# Patient Record
Sex: Male | Born: 1945
Health system: Southern US, Community
[De-identification: ages and names within clinical notes are randomized; demographics above are authoritative.]

## PROBLEM LIST (undated history)

## (undated) ENCOUNTER — Emergency Department (HOSPITAL_BASED_OUTPATIENT_CLINIC_OR_DEPARTMENT_OTHER): Admission: EM | Payer: Medicare Other | Source: Home / Self Care

## (undated) DIAGNOSIS — I10 Essential (primary) hypertension: Secondary | ICD-10-CM

## (undated) DIAGNOSIS — B269 Mumps without complication: Secondary | ICD-10-CM

## (undated) DIAGNOSIS — E785 Hyperlipidemia, unspecified: Secondary | ICD-10-CM

## (undated) DIAGNOSIS — H44529 Atrophy of globe, unspecified eye: Secondary | ICD-10-CM

## (undated) DIAGNOSIS — R0789 Other chest pain: Secondary | ICD-10-CM

## (undated) DIAGNOSIS — S0300XA Dislocation of jaw, unspecified side, initial encounter: Secondary | ICD-10-CM

## (undated) DIAGNOSIS — K2270 Barrett's esophagus without dysplasia: Secondary | ICD-10-CM

## (undated) DIAGNOSIS — R609 Edema, unspecified: Secondary | ICD-10-CM

## (undated) DIAGNOSIS — B069 Rubella without complication: Secondary | ICD-10-CM

## (undated) DIAGNOSIS — K219 Gastro-esophageal reflux disease without esophagitis: Secondary | ICD-10-CM

## (undated) DIAGNOSIS — Z8719 Personal history of other diseases of the digestive system: Secondary | ICD-10-CM

## (undated) DIAGNOSIS — I219 Acute myocardial infarction, unspecified: Secondary | ICD-10-CM

## (undated) DIAGNOSIS — G8929 Other chronic pain: Secondary | ICD-10-CM

## (undated) DIAGNOSIS — H40059 Ocular hypertension, unspecified eye: Secondary | ICD-10-CM

## (undated) DIAGNOSIS — M8580 Other specified disorders of bone density and structure, unspecified site: Secondary | ICD-10-CM

## (undated) DIAGNOSIS — H5440 Blindness, one eye, unspecified eye: Secondary | ICD-10-CM

## (undated) DIAGNOSIS — B019 Varicella without complication: Secondary | ICD-10-CM

## (undated) DIAGNOSIS — B059 Measles without complication: Secondary | ICD-10-CM

## (undated) DIAGNOSIS — M549 Dorsalgia, unspecified: Secondary | ICD-10-CM

## (undated) HISTORY — PX: MANDIBLE SURGERY: SHX707

## (undated) HISTORY — DX: Measles without complication: B05.9

## (undated) HISTORY — DX: Ocular hypertension, unspecified eye: H40.059

## (undated) HISTORY — DX: Gastro-esophageal reflux disease without esophagitis: K21.9

## (undated) HISTORY — DX: Atrophy of globe, unspecified eye: H44.529

## (undated) HISTORY — DX: Personal history of other diseases of the digestive system: Z87.19

## (undated) HISTORY — DX: Edema, unspecified: R60.9

## (undated) HISTORY — PX: WISDOM TOOTH EXTRACTION: SHX21

## (undated) HISTORY — DX: Rubella without complication: B06.9

## (undated) HISTORY — DX: Essential (primary) hypertension: I10

## (undated) HISTORY — PX: SPINAL CORD STIMULATOR IMPLANT: SHX2422

## (undated) HISTORY — DX: Other specified disorders of bone density and structure, unspecified site: M85.80

## (undated) HISTORY — DX: Other chest pain: R07.89

## (undated) HISTORY — DX: Acute myocardial infarction, unspecified: I21.9

## (undated) HISTORY — PX: COLONOSCOPY WITH ESOPHAGOGASTRODUODENOSCOPY (EGD): SHX5779

## (undated) HISTORY — DX: Blindness, one eye, unspecified eye: H54.40

## (undated) HISTORY — DX: Mumps without complication: B26.9

## (undated) HISTORY — DX: Varicella without complication: B01.9

## (undated) HISTORY — DX: Hyperlipidemia, unspecified: E78.5

## (undated) HISTORY — DX: Barrett's esophagus without dysplasia: K22.70

## (undated) HISTORY — DX: Dislocation of jaw, unspecified side, initial encounter: S03.00XA

## (undated) HISTORY — DX: Dorsalgia, unspecified: M54.9

## (undated) HISTORY — DX: Other chronic pain: G89.29

## (undated) HISTORY — PX: PROSTATE BIOPSY: SHX241

---

## 1970-06-02 HISTORY — PX: EYE SURGERY: SHX253

## 1970-06-02 HISTORY — PX: ABDOMINAL HERNIA REPAIR: SHX539

## 1971-06-03 HISTORY — PX: APPENDECTOMY: SHX54

## 2003-06-03 HISTORY — PX: CATARACT EXTRACTION, BILATERAL: SHX1313

## 2003-06-03 HISTORY — PX: PARS PLANA VITRECTOMY W/ REPAIR OF MACULAR HOLE: SHX2170

## 2006-06-02 HISTORY — PX: FACIAL RECONSTRUCTION SURGERY: SHX631

## 2008-06-02 HISTORY — PX: PARS PLANA VITRECTOMY W/ REPAIR OF MACULAR HOLE: SHX2170

## 2009-04-13 DIAGNOSIS — M8580 Other specified disorders of bone density and structure, unspecified site: Secondary | ICD-10-CM

## 2009-04-13 HISTORY — DX: Other specified disorders of bone density and structure, unspecified site: M85.80

## 2009-10-31 HISTORY — PX: ESOPHAGOGASTRODUODENOSCOPY: SHX1529

## 2010-11-21 DIAGNOSIS — G8929 Other chronic pain: Secondary | ICD-10-CM | POA: Insufficient documentation

## 2011-01-10 DIAGNOSIS — S0300XA Dislocation of jaw, unspecified side, initial encounter: Secondary | ICD-10-CM | POA: Insufficient documentation

## 2011-01-10 DIAGNOSIS — Z8719 Personal history of other diseases of the digestive system: Secondary | ICD-10-CM

## 2011-01-10 HISTORY — DX: Personal history of other diseases of the digestive system: Z87.19

## 2011-09-10 DIAGNOSIS — H40059 Ocular hypertension, unspecified eye: Secondary | ICD-10-CM

## 2011-09-10 HISTORY — DX: Ocular hypertension, unspecified eye: H40.059

## 2012-03-15 DIAGNOSIS — H44529 Atrophy of globe, unspecified eye: Secondary | ICD-10-CM | POA: Insufficient documentation

## 2012-03-15 DIAGNOSIS — H40009 Preglaucoma, unspecified, unspecified eye: Secondary | ICD-10-CM | POA: Insufficient documentation

## 2012-03-15 HISTORY — DX: Atrophy of globe, unspecified eye: H44.529

## 2012-07-19 DIAGNOSIS — R001 Bradycardia, unspecified: Secondary | ICD-10-CM | POA: Insufficient documentation

## 2012-07-19 DIAGNOSIS — R079 Chest pain, unspecified: Secondary | ICD-10-CM | POA: Insufficient documentation

## 2012-07-19 HISTORY — PX: LEFT HEART CATH: SHX5946

## 2013-02-15 ENCOUNTER — Encounter: Payer: Self-pay | Admitting: Physician Assistant

## 2013-02-15 ENCOUNTER — Ambulatory Visit (INDEPENDENT_AMBULATORY_CARE_PROVIDER_SITE_OTHER): Payer: MEDICARE | Admitting: Physician Assistant

## 2013-02-15 VITALS — BP 124/72 | HR 66 | Temp 97.9°F | Resp 16 | Ht 65.25 in | Wt 186.5 lb

## 2013-02-15 DIAGNOSIS — Z23 Encounter for immunization: Secondary | ICD-10-CM

## 2013-02-15 DIAGNOSIS — I10 Essential (primary) hypertension: Secondary | ICD-10-CM

## 2013-02-15 DIAGNOSIS — M533 Sacrococcygeal disorders, not elsewhere classified: Secondary | ICD-10-CM

## 2013-02-15 DIAGNOSIS — R1013 Epigastric pain: Secondary | ICD-10-CM | POA: Insufficient documentation

## 2013-02-15 DIAGNOSIS — M545 Low back pain: Secondary | ICD-10-CM | POA: Insufficient documentation

## 2013-02-15 DIAGNOSIS — K219 Gastro-esophageal reflux disease without esophagitis: Secondary | ICD-10-CM

## 2013-02-15 DIAGNOSIS — Z299 Encounter for prophylactic measures, unspecified: Secondary | ICD-10-CM

## 2013-02-15 DIAGNOSIS — H9193 Unspecified hearing loss, bilateral: Secondary | ICD-10-CM

## 2013-02-15 DIAGNOSIS — H9190 Unspecified hearing loss, unspecified ear: Secondary | ICD-10-CM

## 2013-02-15 LAB — BASIC METABOLIC PANEL
BUN: 9 mg/dL (ref 6–23)
Calcium: 10 mg/dL (ref 8.4–10.5)
Potassium: 4 mEq/L (ref 3.5–5.3)
Sodium: 137 mEq/L (ref 135–145)

## 2013-02-15 LAB — CBC WITH DIFFERENTIAL/PLATELET
Basophils Absolute: 0 10*3/uL (ref 0.0–0.1)
Basophils Relative: 1 % (ref 0–1)
HCT: 42.4 % (ref 39.0–52.0)
Lymphocytes Relative: 39 % (ref 12–46)
Monocytes Absolute: 0.5 10*3/uL (ref 0.1–1.0)
Neutro Abs: 1.9 10*3/uL (ref 1.7–7.7)
Neutrophils Relative %: 43 % (ref 43–77)
RDW: 13.4 % (ref 11.5–15.5)
WBC: 4.5 10*3/uL (ref 4.0–10.5)

## 2013-02-15 MED ORDER — HYDROCHLOROTHIAZIDE 12.5 MG PO CAPS: 12.5000 mg | ORAL_CAPSULE | Freq: Every day | ORAL | Status: DC

## 2013-02-15 NOTE — Assessment & Plan Note (Signed)
Will set up MRI.  Will call patient with results.  May need orthopedics referral. Continue conservative measures for now.

## 2013-02-15 NOTE — Assessment & Plan Note (Signed)
Epigastric tenderness to palpation.  Patient with hx of GERD.  Will obtain lipase, H. Pylori.  Continue PPI

## 2013-02-15 NOTE — Assessment & Plan Note (Addendum)
Obtain fasting labs.  Patient up-to-date on health maintenance.  Influenza vaccination given.  Zostavax vaccination given.  Will give referral to podiatry per patient's request

## 2013-02-15 NOTE — Assessment & Plan Note (Signed)
Referral to ENT for audiologic evaluation.

## 2013-02-15 NOTE — Patient Instructions (Signed)
Please get labs today.  I will call you with results.  You will get a call from ENT, Podiatry, and imaging for your MRI.  Please wear compression stocking on the left leg and elevate at home to help reduce swelling.  I will tell you when you need to follow-up after I get your lab results.  Back Pain, Adult Low back pain is very common. About 1 in 5 people have back pain.The cause of low back pain is rarely dangerous. The pain often gets better over time.About half of people with a sudden onset of back pain feel better in just 2 weeks. About 8 in 10 people feel better by 6 weeks.  CAUSES Some common causes of back pain include:  Strain of the muscles or ligaments supporting the spine.  Wear and tear (degeneration) of the spinal discs.  Arthritis.  Direct injury to the back. DIAGNOSIS Most of the time, the direct cause of low back pain is not known.However, back pain can be treated effectively even when the exact cause of the pain is unknown.Answering your caregiver's questions about your overall health and symptoms is one of the most accurate ways to make sure the cause of your pain is not dangerous. If your caregiver needs more information, he or she may order lab work or imaging tests (X-rays or MRIs).However, even if imaging tests show changes in your back, this usually does not require surgery. HOME CARE INSTRUCTIONS For many people, back pain returns.Since low back pain is rarely dangerous, it is often a condition that people can learn to [facility] their own.   Remain active. It is stressful on the back to sit or stand in one place. Do not sit, drive, or stand in one place for more than 30 minutes at a time. Take short walks on level surfaces as soon as pain allows.Try to increase the length of time you walk each day.  Do not stay in bed.Resting more than 1 or 2 days can delay your recovery.  Do not avoid exercise or work.Your body is made to move.It is not dangerous to be  active, even though your back may hurt.Your back will likely heal faster if you return to being active before your pain is gone.  Pay attention to your body when you bend and lift. Many people have less discomfortwhen lifting if they bend their knees, keep the load close to their bodies,and avoid twisting. Often, the most comfortable positions are those that put less stress on your recovering back.  Find a comfortable position to sleep. Use a firm mattress and lie on your side with your knees slightly bent. If you lie on your back, put a pillow under your knees.  Only take over-the-counter or prescription medicines as directed by your caregiver. Over-the-counter medicines to reduce pain and inflammation are often the most helpful.Your caregiver may prescribe muscle relaxant drugs.These medicines help dull your pain so you can more quickly return to your normal activities and healthy exercise.  Put ice on the injured area.  Put ice in a plastic bag.  Place a towel between your skin and the bag.  Leave the ice on for 15-20 minutes, 3-4 times a day for the first 2 to 3 days. After that, ice and heat may be alternated to reduce pain and spasms.  Ask your caregiver about trying back exercises and gentle massage. This may be of some benefit.  Avoid feeling anxious or stressed.Stress increases muscle tension and can worsen back pain.It is  important to recognize when you are anxious or stressed and learn ways to manage it.Exercise is a great option. SEEK MEDICAL CARE IF:  You have pain that is not relieved with rest or medicine.  You have pain that does not improve in 1 week.  You have new symptoms.  You are generally not feeling well. SEEK IMMEDIATE MEDICAL CARE IF:   You have pain that radiates from your back into your legs.  You develop new bowel or bladder control problems.  You have unusual weakness or numbness in your arms or legs.  You develop nausea or vomiting.  You  develop abdominal pain.  You feel faint. Document Released: 05/19/2005 Document Revised: 11/18/2011 Document Reviewed: 10/07/2010 [facility] Patient Information 2014 [place], [place].

## 2013-02-15 NOTE — Assessment & Plan Note (Signed)
Continue current regimen.  Avoid trigger foods.  TUMS for breakthrough symptoms.  If symptoms worsen, will need referral to ENT or GI.

## 2013-02-15 NOTE — Progress Notes (Signed)
Patient ID: Brian [name], male   DOB: [birth year], 67 y.o.   MRN: [id number]  Patient presents to clinic today to establish care.  Medical record release given to obtain records from old PCP at [facility].  Acute Concerns: Patient complains of lumbar pain with shooting pains traveling down his right leg.  Patient states this has been present over the past 6 months and is worsening in severity.  Patient states that PCP obtained X ray of his back which showed arthritis.  Has taken Tylenol and Ibuprofen with no relief of symptoms.  Patient denies numbness or tingling of RLE, saddle anesthesia, urinary incontinence/retention, or fecal incontinence, retention.  Denies weakness of RLE.  Patient also endorses decrease in hearing.  Patient denies ear drainage, pain or tinnitus.  Denies having a formal audiology examination in the past.  Denies trauma to ears.  Denies q-tip usage.  Is unsure if hearing change is coming from one ear or both ears.  Patient also requests referral to Podiatry for chronic calluses on both feet.  Chronic Issues: (1) GERD -- Prilosec 40 mg daily.  Has breakthrough symptoms for which he takes [place].  Has had Upper GI in the past and biopsy which ruled/out Barrett's Esophagus. Was told he should probably have a Nissen procedure but declined. Patient tries to be mindful of which foods he eats and tries to avoid late-night meals  (2) Hyperlipidemia -- Patient reports history of high cholesterol for which he takes a daily Zocor 40 mg tablet.  (3) Peripheral edema -- Patient reports history of chronic edema of R ankle for which his previous PCP prescribed Microzide.  He states he is unaware if he has history of high blood pressure.    Health Maintenance: Eye -- Last Ophthalmology exam 6 months ago.  Has Ophthalmologist appointment in 1 month in [facility]. Dental -- edentulous.  No dental visit in 1+ year. Colonoscopy -- 2 years ago. No abnormalities Immunizations -- tetanus  2013.  Pneumonia vaccine (2013).  Shingles vaccine overdue.  Needs influenza vaccine  Past Medical History  Diagnosis Date  . Chicken pox   . GERD (gastroesophageal reflux disease)   . Measles   . [place] measles   . Mumps   . Hyperlipidemia   . Edema     Left Leg    No current outpatient prescriptions on file prior to visit.   No current facility-administered medications on file prior to visit.    No Known Allergies  Family History  Problem Relation Age of Onset  . Alzheimer's disease Mother     Deceased  . Diabetes Maternal Grandmother   . Heart attack Maternal Grandmother   . Prostate cancer Maternal Uncle   . Diabetes Maternal Uncle   . Healthy Son     History   Social History  . Marital Status: Single    Spouse Name: N/A    Number of Children: N/A  . Years of Education: N/A   Social History Main Topics  . Smoking status: Former Smoker    Quit date: 06/02/1966  . Smokeless tobacco: Never Used  . Alcohol Use: No  . Drug Use: No  . Sexual Activity: None   Other Topics Concern  . None   Social History Narrative  . None    Review of Systems  Constitutional: Negative for fever, chills, weight loss and malaise/fatigue.  HENT: Positive for hearing loss. Negative for ear pain, tinnitus and ear discharge.   Eyes: Negative for blurred vision, double  vision and photophobia.  Respiratory: Negative for cough, shortness of breath and wheezing.   Cardiovascular: Negative for chest pain and palpitations.  Gastrointestinal: Positive for heartburn. Negative for nausea, vomiting, abdominal pain, diarrhea, constipation, blood in stool and melena.  Genitourinary: Negative for dysuria, urgency, frequency, hematuria and flank pain.  Musculoskeletal: Positive for back pain.  Neurological: Negative for seizures, loss of consciousness and headaches.  Endo/Heme/Allergies: Negative for environmental allergies.  Psychiatric/Behavioral: Negative for depression, suicidal ideas  and substance abuse. The patient is not nervous/anxious.    Filed Vitals:   02/15/13 0917  BP: 124/72  Pulse: 66  Temp: 97.9 F (36.6 C)  Resp: 16   Physical Exam  Vitals reviewed. Constitutional: He is oriented to person, place, and time and well-developed, well-nourished, and in no distress.  HENT:  Head: Normocephalic and atraumatic.  Right Ear: External ear normal.  Left Ear: External ear normal.  Nose: Nose normal.  Mouth/Throat: Oropharynx is clear and moist. No oropharyngeal exudate.  Eyes: EOM are normal.  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  Neck: Neck supple.  Cardiovascular: Normal rate, regular rhythm, normal heart sounds and intact distal pulses.   Mild edema of RLE up to shin. Edema is non-pitting.  Pulmonary/Chest: Effort normal and breath sounds normal.  Abdominal: Soft. Bowel sounds are normal. He exhibits no distension and no mass. There is no rebound and no guarding.  Moderate epigastric tenderness to palpation  Musculoskeletal:  Pain of lumbosacral spine and perispinal musculature without bony abnormality or muscle spasm.  Range of motion WNL.  Positive straight leg raise test of R leg.  Sensation intact.   R Ankle with edema.  No bony tenderness or deformity noted.  Sensation intact.  Lymphadenopathy:    He has no cervical adenopathy.  Neurological: He is alert and oriented to person, place, and time. He has normal reflexes. No cranial nerve deficit.  Skin: Skin is warm and dry. No rash noted.   Assessment/Plan: Acid reflux Continue current regimen.  Avoid trigger foods.  TUMS for breakthrough symptoms.  If symptoms worsen, will need referral to ENT or GI.  Hearing loss Referral to ENT for audiologic evaluation.  Preventive measure Obtain fasting labs.  Patient up-to-date on health maintenance.  Influenza vaccination given.  Zostavax vaccination given.  Will give referral to podiatry per patient's  request  Lumbosacral pain Will set up MRI.  Will call patient with results.  May need orthopedics referral. Continue conservative measures for now.  Abdominal pain, epigastric Epigastric tenderness to palpation.  Patient with hx of GERD.  Will obtain lipase, H. Pylori.  Continue PPI

## 2013-02-16 ENCOUNTER — Telehealth: Payer: Self-pay | Admitting: Physician Assistant

## 2013-02-16 LAB — H. PYLORI BREATH TEST: H. pylori Breath Test: NEGATIVE

## 2013-02-16 MED ORDER — TIMOLOL MALEATE 0.5 % OP SOLN: 1.0000 [drp] | Freq: Every day | OPHTHALMIC | Status: DC

## 2013-02-16 NOTE — Telephone Encounter (Signed)
Patient states that he was told to call in with what type of ophthalmic solution he uses.  Timolol Maleate 0.5%  Once daily

## 2013-02-19 ENCOUNTER — Ambulatory Visit (HOSPITAL_BASED_OUTPATIENT_CLINIC_OR_DEPARTMENT_OTHER)
Admission: RE | Admit: 2013-02-19 | Discharge: 2013-02-19 | Disposition: A | Discharge status: Home / Self Care | Payer: MEDICARE | Source: Ambulatory Visit | Attending: Physician Assistant | Admitting: Physician Assistant

## 2013-02-19 DIAGNOSIS — M545 Low back pain, unspecified: Secondary | ICD-10-CM

## 2013-02-19 DIAGNOSIS — M51379 Other intervertebral disc degeneration, lumbosacral region without mention of lumbar back pain or lower extremity pain: Secondary | ICD-10-CM | POA: Insufficient documentation

## 2013-02-19 DIAGNOSIS — M5137 Other intervertebral disc degeneration, lumbosacral region: Secondary | ICD-10-CM | POA: Insufficient documentation

## 2013-02-19 DIAGNOSIS — M47817 Spondylosis without myelopathy or radiculopathy, lumbosacral region: Secondary | ICD-10-CM | POA: Insufficient documentation

## 2013-02-21 ENCOUNTER — Telehealth: Payer: Self-pay | Admitting: *Deleted

## 2013-02-21 NOTE — Telephone Encounter (Signed)
Spoke to patient RE: medical records from [facility]; states he has spoken with them & told that records were in process of being put together & he will check back with them, as we have not received medical records as of yet/SLS

## 2013-02-24 ENCOUNTER — Telehealth: Payer: Self-pay | Admitting: Physician Assistant

## 2013-02-24 DIAGNOSIS — IMO0002 Reserved for concepts with insufficient information to code with codable children: Secondary | ICD-10-CM

## 2013-02-24 DIAGNOSIS — M5417 Radiculopathy, lumbosacral region: Secondary | ICD-10-CM

## 2013-02-24 NOTE — Telephone Encounter (Signed)
Please inform patient that his imaging came back showing a few mild slipped discs and mild spinal stenosis (narrowing) that is most likely the cause of his symptoms.  I am placing a referral for him to see an orthopedist to help manage his symptoms.

## 2013-02-25 ENCOUNTER — Encounter: Payer: Self-pay | Admitting: Podiatry

## 2013-02-25 ENCOUNTER — Ambulatory Visit (INDEPENDENT_AMBULATORY_CARE_PROVIDER_SITE_OTHER): Payer: Medicare Other | Admitting: Podiatry

## 2013-02-25 VITALS — BP 127/83 | HR 85 | Ht 65.25 in | Wt 186.0 lb

## 2013-02-25 DIAGNOSIS — Q828 Other specified congenital malformations of skin: Secondary | ICD-10-CM | POA: Insufficient documentation

## 2013-02-25 DIAGNOSIS — M25579 Pain in unspecified ankle and joints of unspecified foot: Secondary | ICD-10-CM | POA: Insufficient documentation

## 2013-02-25 NOTE — Telephone Encounter (Signed)
Patient informed, understood & agreed/SLS  

## 2013-02-25 NOTE — Progress Notes (Signed)
Subjective: 67 y.o. year old retired male patient presents complaining of painful calluses especially on right foot is painful x 1 year. Patient requests calluses trimmed.  He used to have this painful calluses trimmed every 3 months prior to move form [place] down to .  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 ENT ROS: negative Allergy and Immunology ROS: negative Hematological and Lymphatic ROS: negative Endocrine ROS: negative Respiratory ROS: no cough, shortness of breath, or wheezing Cardiovascular ROS: no chest pain or dyspnea on exertion Gastrointestinal ROS: no abdominal pain, change in bowel habits, or black or bloody stools Genito-Urinary ROS: no dysuria, trouble voiding, or hematuria Musculoskeletal ROS: negative Neurological ROS: no TIA or stroke symptoms Dermatological ROS: Only callus on bottom of feet.  Objective: Dermatologic: Porokeratotic lesions plantar surface sub 5 bilateral.  Vascular: Pedal pulses are all palpable. Positive of mild forefoot edema. Orthopedic: Rectus foot without gross deformities. Neurologic: All epicritic and tactile sensations grossly intact.  Assessment: Painful plantar porokeratosis bilateral.  Treatment: All mycotic calluses debrided.  Discussed continue with compression socks.  Return in 3 months or as needed.

## 2013-02-25 NOTE — Telephone Encounter (Signed)
Spoke with pt RE: Imaging results 09.06.14 & informed him that we are still awaiting his Medical Records from [facility]

## 2013-02-25 NOTE — Patient Instructions (Addendum)
Seen for painful calluses. Debrided all lesions. Return in 3 months or as needed.

## 2013-03-03 ENCOUNTER — Telehealth: Payer: Self-pay | Admitting: Physician Assistant

## 2013-03-03 NOTE — Telephone Encounter (Signed)
Received medical records from [facility] today; pt informed/SLS

## 2013-03-03 NOTE — Telephone Encounter (Signed)
Received medical records from [facility]  P: [id number] F: [phone]

## 2013-03-14 ENCOUNTER — Telehealth: Payer: Self-pay | Admitting: Physician Assistant

## 2013-03-14 NOTE — Telephone Encounter (Signed)
Received medical records from [facility]  [address] [place], [place] [address]

## 2013-04-29 ENCOUNTER — Encounter: Payer: Self-pay | Admitting: *Deleted

## 2013-05-20 ENCOUNTER — Telehealth: Payer: Self-pay | Admitting: Physician Assistant

## 2013-05-20 DIAGNOSIS — I10 Essential (primary) hypertension: Secondary | ICD-10-CM

## 2013-05-20 NOTE — Telephone Encounter (Signed)
PATIENT STATES HE IS ON SEVERAL MEDS AND IS ALMOST OUT OF THEM.  DOES Brian [name] WANT HIM TO CONTINUE TAKING THESE MEDS.  HE USES RIGHT SOURCE

## 2013-05-23 MED ORDER — SIMVASTATIN 40 MG PO TABS: 40.0000 mg | ORAL_TABLET | Freq: Every evening | ORAL | Status: DC

## 2013-05-23 MED ORDER — OMEPRAZOLE 40 MG PO CPDR: 40.0000 mg | DELAYED_RELEASE_CAPSULE | Freq: Every day | ORAL | Status: DC

## 2013-05-23 MED ORDER — HYDROCHLOROTHIAZIDE 12.5 MG PO CAPS: 12.5000 mg | ORAL_CAPSULE | Freq: Every day | ORAL | Status: DC

## 2013-05-23 NOTE — Telephone Encounter (Signed)
Rx request to pharmacy; pt informed/SLS  

## 2013-05-27 ENCOUNTER — Ambulatory Visit: Payer: Medicare Other | Admitting: Podiatry

## 2013-05-31 ENCOUNTER — Ambulatory Visit: Payer: Medicare Other | Admitting: Podiatry

## 2013-08-25 ENCOUNTER — Telehealth: Payer: Self-pay | Admitting: Physician Assistant

## 2013-08-25 ENCOUNTER — Encounter: Payer: Self-pay | Admitting: Physician Assistant

## 2013-08-25 ENCOUNTER — Ambulatory Visit (INDEPENDENT_AMBULATORY_CARE_PROVIDER_SITE_OTHER): Payer: MEDICARE | Admitting: Physician Assistant

## 2013-08-25 VITALS — BP 108/76 | HR 76 | Temp 97.5°F | Resp 16 | Ht 65.25 in | Wt 184.5 lb

## 2013-08-25 DIAGNOSIS — I10 Essential (primary) hypertension: Secondary | ICD-10-CM

## 2013-08-25 DIAGNOSIS — K219 Gastro-esophageal reflux disease without esophagitis: Secondary | ICD-10-CM

## 2013-08-25 DIAGNOSIS — M533 Sacrococcygeal disorders, not elsewhere classified: Secondary | ICD-10-CM

## 2013-08-25 DIAGNOSIS — M545 Low back pain, unspecified: Secondary | ICD-10-CM

## 2013-08-25 DIAGNOSIS — E785 Hyperlipidemia, unspecified: Secondary | ICD-10-CM

## 2013-08-25 MED ORDER — OMEPRAZOLE 40 MG PO CPDR: 40.0000 mg | DELAYED_RELEASE_CAPSULE | Freq: Every day | ORAL | Status: DC

## 2013-08-25 MED ORDER — HYDROCODONE-ACETAMINOPHEN 10-325 MG PO TABS: 1.0000 | ORAL_TABLET | Freq: Three times a day (TID) | ORAL | Status: DC | PRN

## 2013-08-25 MED ORDER — SIMVASTATIN 40 MG PO TABS: 40.0000 mg | ORAL_TABLET | Freq: Every evening | ORAL | Status: DC

## 2013-08-25 MED ORDER — HYDROCHLOROTHIAZIDE 12.5 MG PO CAPS: 12.5000 mg | ORAL_CAPSULE | Freq: Every day | ORAL | Status: DC

## 2013-08-25 NOTE — Progress Notes (Signed)
Patient presents to clinic today c/o low back pain with radiation to the right leg.  Patient has significant history of lumbosacral radiculopathy. Has been seen in this clinic previously (about 6 months ago) for similar symptoms. MRI obtained in 01/2013 revealed spinal stenosis at L4-L5.  Patient was sent to Orthopedics for further evaluation and treatment options. Was told he would likely need surgery.  Denies being given other treatment options.  Does state he was given exercises to do that help prevent frequent flare-ups.  Patient denies numbness or tingling. Denies saddle anesthesia.   Past Medical History  Diagnosis Date  . Chicken pox   . GERD (gastroesophageal reflux disease)   . Measles   . [place] measles   . Mumps   . Hyperlipidemia   . Edema     Left Leg  . Essential hypertension, benign   . Blind right eye   . Heart attack   . Chronic back pain   . Dislocation, jaw 1960s  . Personal history of other diseases of digestive system 08.10.12    Gastric ulcer  . 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 Osteopenia 11.12.10    bone density test  . 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    Current Outpatient Prescriptions on File Prior to Visit  Medication Sig Dispense Refill  . b complex vitamins tablet Take 1 tablet by mouth daily.      . Calcium Carbonate-Vitamin D (CALCIUM-VITAMIN D3 PO) Take by mouth daily.      . timolol (TIMOPTIC) 0.5 % ophthalmic solution Place 1 drop into the left eye daily.  10 mL  12   No current facility-administered medications on file prior to visit.    No Known Allergies  Family History  Problem Relation Age of Onset  . Alzheimer's disease Mother     Deceased  . Diabetes Maternal Grandmother   . Heart attack Maternal Grandmother   . Prostate cancer Maternal Uncle   . Diabetes Maternal Uncle   . Healthy Son   . Cataracts Maternal Aunt     History   Social History  . Marital Status: Single    Spouse Name: N/A    Number of  Children: N/A  . Years of Education: N/A   Social History Main Topics  . Smoking status: Former Smoker    Quit date: 06/02/1966  . Smokeless tobacco: Never Used  . Alcohol Use: No  . Drug Use: No  . Sexual Activity: None   Other Topics Concern  . None   Social History Narrative  . None   Review of Systems - See HPI.  All other ROS are negative.  BP 108/76  Pulse 76  Temp(Src) 97.5 F (36.4 C) (Oral)  Resp 16  Ht 5' 5.25" (1.657 m)  Wt 184 lb 8 oz (83.689 kg)  BMI 30.48 kg/m2  SpO2 97%  Physical Exam  Vitals reviewed. Constitutional: He is oriented to person, place, and time.  Cardiovascular: Normal rate, regular rhythm, normal heart sounds and intact distal pulses.   Pulmonary/Chest: Effort normal and breath sounds normal. No respiratory distress. He has no wheezes. He has no rales. He exhibits no tenderness.  Musculoskeletal:       Right hip: Normal.       Left hip: Normal.       Cervical back: Normal.       Thoracic back: Normal.       Lumbar back: He exhibits tenderness and pain. He exhibits normal range of motion.  Neurological: He is alert and oriented to person, place, and time.  Skin: Skin is warm and dry. No rash noted.  Psychiatric: Affect normal.   No results found for this or any previous visit (from the past 2160 hour(s)).  Assessment/Plan: Lumbosacral pain Flare-up. Continue exercises given by Orthopedics.  Rx Norco.  Aspercreme.  Heating Pad.  Call if symptoms not improving.

## 2013-08-25 NOTE — Telephone Encounter (Signed)
emmi mailed to patient °

## 2013-08-25 NOTE — Patient Instructions (Signed)
Please continue your prescription medications as directed.  Take Norco as directed, if needed, for severe pain.  Please avoid heavy lifting or overexertion.  Apply topical Aspercreme.  Apply heating pads.  Continue exercises.  Call or return if symptoms not improving.

## 2013-08-25 NOTE — Progress Notes (Signed)
Pre visit review using our clinic review tool, if applicable. No additional management support is needed unless otherwise documented below in the visit note/SLS  

## 2013-08-25 NOTE — Assessment & Plan Note (Signed)
Flare-up. Continue exercises given by Orthopedics.  Rx Norco.  Aspercreme.  Heating Pad.  Call if symptoms not improving.

## 2013-09-22 ENCOUNTER — Encounter: Payer: Self-pay | Admitting: Physician Assistant

## 2013-09-22 ENCOUNTER — Ambulatory Visit (INDEPENDENT_AMBULATORY_CARE_PROVIDER_SITE_OTHER): Payer: MEDICARE | Admitting: Physician Assistant

## 2013-09-22 VITALS — BP 116/78 | HR 66 | Temp 97.8°F | Resp 16 | Ht 65.25 in | Wt 182.2 lb

## 2013-09-22 DIAGNOSIS — K5289 Other specified noninfective gastroenteritis and colitis: Secondary | ICD-10-CM

## 2013-09-22 DIAGNOSIS — R42 Dizziness and giddiness: Secondary | ICD-10-CM

## 2013-09-22 DIAGNOSIS — M62838 Other muscle spasm: Secondary | ICD-10-CM

## 2013-09-22 DIAGNOSIS — K529 Noninfective gastroenteritis and colitis, unspecified: Secondary | ICD-10-CM | POA: Insufficient documentation

## 2013-09-22 LAB — CBC WITH DIFFERENTIAL/PLATELET
BASOS ABS: 0 10*3/uL (ref 0.0–0.1)
BASOS PCT: 1 % (ref 0–1)
Eosinophils Absolute: 0.1 10*3/uL (ref 0.0–0.7)
Eosinophils Relative: 4 % (ref 0–5)
HCT: 41.3 % (ref 39.0–52.0)
Hemoglobin: 14 g/dL (ref 13.0–17.0)
Lymphocytes Relative: 48 % — ABNORMAL HIGH (ref 12–46)
Lymphs Abs: 1.4 10*3/uL (ref 0.7–4.0)
MCH: 27.9 pg (ref 26.0–34.0)
MCHC: 33.9 g/dL (ref 30.0–36.0)
MCV: 82.3 fL (ref 78.0–100.0)
Monocytes Absolute: 0.5 10*3/uL (ref 0.1–1.0)
Monocytes Relative: 16 % — ABNORMAL HIGH (ref 3–12)
NEUTROS ABS: 0.9 10*3/uL — AB (ref 1.7–7.7)
NEUTROS PCT: 31 % — AB (ref 43–77)
Platelets: 360 10*3/uL (ref 150–400)
RBC: 5.02 MIL/uL (ref 4.22–5.81)
RDW: 13.3 % (ref 11.5–15.5)
WBC: 2.9 10*3/uL — ABNORMAL LOW (ref 4.0–10.5)

## 2013-09-22 LAB — BASIC METABOLIC PANEL
BUN: 9 mg/dL (ref 6–23)
CO2: 31 mEq/L (ref 19–32)
Calcium: 9.3 mg/dL (ref 8.4–10.5)
Chloride: 99 mEq/L (ref 96–112)
Creat: 1.07 mg/dL (ref 0.50–1.35)
Glucose, Bld: 100 mg/dL — ABNORMAL HIGH (ref 70–99)
Potassium: 3.8 mEq/L (ref 3.5–5.3)
SODIUM: 139 meq/L (ref 135–145)

## 2013-09-22 MED ORDER — METHOCARBAMOL 500 MG PO TABS: 500.0000 mg | ORAL_TABLET | Freq: Three times a day (TID) | ORAL | Status: DC

## 2013-09-22 NOTE — Assessment & Plan Note (Signed)
Positional.  Likely related to mild dehydration from gastroenteritis.  Increase fluid intake.  Monitor BP at home.  If BP <100/70 call the office.  Will obtain CBC and BMP.

## 2013-09-22 NOTE — Patient Instructions (Signed)
Increase fluid intake.  Avoid sudden change in position.  If standing, get up slowly and wait a second before moving.  Please continue medications as directed.  The Robaxin should help with muscle spasm. Please obtain labs.  I will call you with your results.  I feel symptoms should improve as you continue to recover from your stomach bug.  If anything worsens, do not hesitate to call us.

## 2013-09-22 NOTE — Progress Notes (Signed)
Patient presents to clinic today c/o mild lightheadedness over the past few days.  Endorses having a stomach bug last week with nausea, vomiting and diarrhea. States it resolved a few days ago but he has been left with some mild fatigue and occasional lightheadedness upon standing too quickly.  Denies vertigo.  Past Medical History  Diagnosis Date  . Chicken pox   . GERD (gastroesophageal reflux disease)   . Measles   . [place] measles   . Mumps   . Hyperlipidemia   . Edema     Left Leg  . Essential hypertension, benign   . Blind right eye   . Heart attack   . Chronic back pain   . Dislocation, jaw 1960s  . Personal history of other diseases of digestive system 08.10.12    Gastric ulcer  . 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 Osteopenia 11.12.10    bone density test  . 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    Current Outpatient Prescriptions on File Prior to Visit  Medication Sig Dispense Refill  . b complex vitamins tablet Take 1 tablet by mouth daily.      . Calcium Carbonate-Vitamin D (CALCIUM-VITAMIN D3 PO) Take by mouth daily.      . hydrochlorothiazide (MICROZIDE) 12.5 MG capsule Take 1 capsule (12.5 mg total) by mouth daily.  90 capsule  0  . HYDROcodone-acetaminophen (NORCO) 10-325 MG per tablet Take 1 tablet by mouth every 8 (eight) hours as needed.  40 tablet  0  . omeprazole (PRILOSEC) 40 MG capsule Take 1 capsule (40 mg total) by mouth daily.  90 capsule  0  . simvastatin (ZOCOR) 40 MG tablet Take 1 tablet (40 mg total) by mouth every evening.  90 tablet  0  . timolol (TIMOPTIC) 0.5 % ophthalmic solution Place 1 drop into the left eye daily.  10 mL  12   No current facility-administered medications on file prior to visit.    No Known Allergies  Family History  Problem Relation Age of Onset  . Alzheimer's disease Mother     Deceased  . Diabetes Maternal Grandmother   . Heart attack Maternal Grandmother   . Prostate cancer Maternal Uncle   .  Diabetes Maternal Uncle   . Healthy Son   . Cataracts Maternal Aunt     History   Social History  . Marital Status: Single    Spouse Name: N/A    Number of Children: N/A  . Years of Education: N/A   Social History Main Topics  . Smoking status: Former Smoker    Quit date: 06/02/1966  . Smokeless tobacco: Never Used  . Alcohol Use: No  . Drug Use: No  . Sexual Activity: None   Other Topics Concern  . None   Social History Narrative  . None   Review of Systems - See HPI.  All other ROS are negative.  BP 116/78  Pulse 66  Temp(Src) 97.8 F (36.6 C) (Oral)  Resp 16  Ht 5' 5.25" (1.657 m)  Wt 182 lb 4 oz (82.668 kg)  BMI 30.11 kg/m2  SpO2 97%  Physical Exam  Vitals reviewed. Constitutional: He is oriented to person, place, and time and well-developed, well-nourished, and in no distress.  HENT:  Head: Normocephalic and atraumatic.  Eyes: Conjunctivae are normal. Pupils are equal, round, and reactive to light.  Neck: Neck supple.  Cardiovascular: Normal rate, regular rhythm, normal heart sounds and intact distal pulses.   Pulmonary/Chest: Effort normal and breath  sounds normal. No respiratory distress. He has no wheezes. He has no rales. He exhibits no tenderness.  Neurological: He is alert and oriented to person, place, and time. No cranial nerve deficit.  Skin: Skin is warm and dry. No rash noted.  Psychiatric: Affect normal.   Assessment/Plan: Gastroenteritis Resolving.  Increase fluids.  Foods as tolerated.  Lightheadedness Positional.  Likely related to mild dehydration from gastroenteritis.  Increase fluid intake.  Monitor BP at home.  If BP <100/70 call the office.  Will obtain CBC and BMP.

## 2013-09-22 NOTE — Assessment & Plan Note (Signed)
Resolving.  Increase fluids.  Foods as tolerated.

## 2013-09-22 NOTE — Progress Notes (Signed)
Pre visit review using our clinic review tool, if applicable. No additional management support is needed unless otherwise documented below in the visit note/SLS  

## 2013-09-27 ENCOUNTER — Telehealth: Payer: Self-pay | Admitting: *Deleted

## 2013-09-27 DIAGNOSIS — D729 Disorder of white blood cells, unspecified: Secondary | ICD-10-CM

## 2013-09-27 NOTE — Telephone Encounter (Signed)
Message copied by Ronny Flurry on Tue Sep 27, 2013  5:00 PM ------      Message from: Raiford Noble      Created: Fri Sep 23, 2013  7:20 AM       Labs good overall.  Lymphocytes are increased which is usually indicative of a viral illness.  However, atypical lymphocytes are present which sometimes represents Mono and other significant viruses.  I would like to recheck CBC in 1 week to ensure resolution of this.  IF atypical lymphocytes are still present, then we will need to obtain further lab workup. ------

## 2013-09-27 NOTE — Telephone Encounter (Signed)
Notified pt and entered lab order.

## 2013-09-28 NOTE — Telephone Encounter (Signed)
Patient called back wanting to know if we could add the diabetes test to this lab?

## 2013-10-04 LAB — CBC WITH DIFFERENTIAL/PLATELET
Basophils Absolute: 0 10*3/uL (ref 0.0–0.1)
Basophils Relative: 1 % (ref 0–1)
EOS PCT: 3 % (ref 0–5)
Eosinophils Absolute: 0.1 10*3/uL (ref 0.0–0.7)
HEMATOCRIT: 41 % (ref 39.0–52.0)
Hemoglobin: 13.5 g/dL (ref 13.0–17.0)
Lymphocytes Relative: 44 % (ref 12–46)
Lymphs Abs: 1.9 10*3/uL (ref 0.7–4.0)
MCH: 27.3 pg (ref 26.0–34.0)
MCHC: 32.9 g/dL (ref 30.0–36.0)
MCV: 83 fL (ref 78.0–100.0)
Monocytes Absolute: 0.7 10*3/uL (ref 0.1–1.0)
Monocytes Relative: 15 % — ABNORMAL HIGH (ref 3–12)
Neutro Abs: 1.6 10*3/uL — ABNORMAL LOW (ref 1.7–7.7)
Neutrophils Relative %: 37 % — ABNORMAL LOW (ref 43–77)
PLATELETS: 390 10*3/uL (ref 150–400)
RBC: 4.94 MIL/uL (ref 4.22–5.81)
RDW: 13.3 % (ref 11.5–15.5)
WBC: 4.4 10*3/uL (ref 4.0–10.5)

## 2013-10-14 ENCOUNTER — Encounter: Payer: Self-pay | Admitting: Physician Assistant

## 2013-12-12 ENCOUNTER — Other Ambulatory Visit: Payer: Self-pay | Admitting: Physician Assistant

## 2013-12-12 NOTE — Telephone Encounter (Signed)
Rx request to pharmacy/SLS  

## 2013-12-21 ENCOUNTER — Ambulatory Visit (INDEPENDENT_AMBULATORY_CARE_PROVIDER_SITE_OTHER): Payer: MEDICARE | Admitting: Physician Assistant

## 2013-12-21 ENCOUNTER — Encounter: Payer: Self-pay | Admitting: Physician Assistant

## 2013-12-21 VITALS — BP 128/76 | HR 64 | Temp 97.5°F | Resp 16 | Ht 65.25 in | Wt 183.0 lb

## 2013-12-21 DIAGNOSIS — M5441 Lumbago with sciatica, right side: Secondary | ICD-10-CM | POA: Insufficient documentation

## 2013-12-21 DIAGNOSIS — M5430 Sciatica, unspecified side: Secondary | ICD-10-CM

## 2013-12-21 MED ORDER — METHYLPREDNISOLONE (PAK) 4 MG PO TABS: ORAL_TABLET | ORAL | Status: DC

## 2013-12-21 MED ORDER — HYDROCODONE-ACETAMINOPHEN 10-325 MG PO TABS: 1.0000 | ORAL_TABLET | Freq: Three times a day (TID) | ORAL | Status: DC | PRN

## 2013-12-21 NOTE — Patient Instructions (Signed)
Please take medications as directed.  Avoid heavy lifting or overexertion.  Apply heating pad to the lower back for 15 minutes, a few times per day.  Apply topical Icy Hot to your lower back. Please call or return to clinic if symptoms are not improving.

## 2013-12-21 NOTE — Progress Notes (Signed)
Pre visit review using our clinic review tool, if applicable. No additional management support is needed unless otherwise documented below in the visit note/SLS  

## 2013-12-21 NOTE — Progress Notes (Signed)
Patient presents to clinic today c/o LBP, mostly right-sided with radiation into lower extremities bilaterally.  Endorses some tingling and numbness of lower extremities.  Denies saddle anesthesia.  Denies change to bowel or bladder habits.  Has not taken anything for his symptoms.  Past Medical History  Diagnosis Date  . Chicken pox   . GERD (gastroesophageal reflux disease)   . Measles   . [place] measles   . Mumps   . Hyperlipidemia   . Edema     Left Leg  . Essential hypertension, benign   . Blind right eye   . Heart attack   . Chronic back pain   . Dislocation, jaw 1960s  . Personal history of other diseases of digestive system 08.10.12    Gastric ulcer  . 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 Osteopenia 11.12.10    bone density test  . 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 Unstable angina     Current Outpatient Prescriptions on File Prior to Visit  Medication Sig Dispense Refill  . b complex vitamins tablet Take 1 tablet by mouth daily.      . Calcium Carbonate-Vitamin D (CALCIUM-VITAMIN D3 PO) Take by mouth daily.      . hydrochlorothiazide (MICROZIDE) 12.5 MG capsule TAKE 1 CAPSULE EVERY DAY  90 capsule  0  . methocarbamol (ROBAXIN) 500 MG tablet Take 1 tablet (500 mg total) by mouth 3 (three) times daily.  90 tablet  0  . omeprazole (PRILOSEC) 40 MG capsule TAKE 1 CAPSULE EVERY DAY  90 capsule  0  . simvastatin (ZOCOR) 40 MG tablet TAKE 1 TABLET EVERY EVENING  90 tablet  0  . timolol (TIMOPTIC) 0.5 % ophthalmic solution Place 1 drop into the left eye daily.  10 mL  12   No current facility-administered medications on file prior to visit.    No Known Allergies  Family History  Problem Relation Age of Onset  . Alzheimer's disease Mother     Deceased  . Diabetes Maternal Grandmother   . Heart attack Maternal Grandmother   . Prostate cancer Maternal Uncle   . Diabetes Maternal Uncle   . Healthy Son   . Cataracts Maternal Aunt     History    Social History  . Marital Status: Single    Spouse Name: N/A    Number of Children: N/A  . Years of Education: N/A   Social History Main Topics  . Smoking status: Former Smoker    Quit date: 06/02/1966  . Smokeless tobacco: Never Used  . Alcohol Use: No  . Drug Use: No  . Sexual Activity: None   Other Topics Concern  . None   Social History Narrative  . None    Review of Systems - See HPI.  All other ROS are negative.  BP 128/76  Pulse 64  Temp(Src) 97.5 F (36.4 C) (Oral)  Resp 16  Ht 5' 5.25" (1.657 m)  Wt 183 lb (83.008 kg)  BMI 30.23 kg/m2  SpO2 97%  Physical Exam  Vitals reviewed. Constitutional: He is oriented to person, place, and time and well-developed, well-nourished, and in no distress.  HENT:  Head: Normocephalic and atraumatic.  Eyes: Conjunctivae are normal. Pupils are equal, round, and reactive to light.  Neck: Neck supple.  Cardiovascular: Normal rate, regular rhythm, normal heart sounds and intact distal pulses.   Pulmonary/Chest: Effort normal and breath sounds normal. No respiratory distress. He has no wheezes. He has no rales. He exhibits no tenderness.  Musculoskeletal:       Lumbar back: He exhibits tenderness and pain. He exhibits no bony tenderness.  Neurological: He is alert and oriented to person, place, and time.  Skin: Skin is warm and dry. No rash noted.  Psychiatric: Affect normal.    Recent Results (from the past 2160 hour(s))  CBC WITH DIFFERENTIAL     Status: Abnormal   Collection Time    10/04/13  9:09 AM      Result Value Ref Range   WBC 4.4  4.0 - 10.5 K/uL   RBC 4.94  4.22 - 5.81 MIL/uL   Hemoglobin 13.5  13.0 - 17.0 g/dL   HCT 41.0  39.0 - 52.0 %   MCV 83.0  78.0 - 100.0 fL   MCH 27.3  26.0 - 34.0 pg   MCHC 32.9  30.0 - 36.0 g/dL   RDW 13.3  11.5 - 15.5 %   Platelets 390  150 - 400 K/uL   Neutrophils Relative % 37 (*) 43 - 77 %   Neutro Abs 1.6 (*) 1.7 - 7.7 K/uL   Lymphocytes Relative 44  12 - 46 %   Lymphs  Abs 1.9  0.7 - 4.0 K/uL   Monocytes Relative 15 (*) 3 - 12 %   Monocytes Absolute 0.7  0.1 - 1.0 K/uL   Eosinophils Relative 3  0 - 5 %   Eosinophils Absolute 0.1  0.0 - 0.7 K/uL   Basophils Relative 1  0 - 1 %   Basophils Absolute 0.0  0.0 - 0.1 K/uL   Smear Review Criteria for review not met      Assessment/Plan: Right-sided low back pain with right-sided sciatica Rx Medrol dose pack.  Refill Norco.  Avoid heavy lifting or overexertion.  Heating pad.  Topical Aspercreme.  Return precautions discussed with patient.

## 2013-12-21 NOTE — Assessment & Plan Note (Signed)
Rx Medrol dose pack.  Refill Norco.  Avoid heavy lifting or overexertion.  Heating pad.  Topical Aspercreme.  Return precautions discussed with patient.

## 2014-01-20 ENCOUNTER — Ambulatory Visit (INDEPENDENT_AMBULATORY_CARE_PROVIDER_SITE_OTHER): Payer: MEDICARE | Admitting: Physician Assistant

## 2014-01-20 ENCOUNTER — Encounter: Payer: Self-pay | Admitting: Physician Assistant

## 2014-01-20 VITALS — BP 116/74 | HR 66 | Temp 98.1°F | Resp 16 | Ht 65.25 in | Wt 184.0 lb

## 2014-01-20 DIAGNOSIS — R42 Dizziness and giddiness: Secondary | ICD-10-CM

## 2014-01-20 DIAGNOSIS — H8110 Benign paroxysmal vertigo, unspecified ear: Secondary | ICD-10-CM | POA: Insufficient documentation

## 2014-01-20 DIAGNOSIS — R0789 Other chest pain: Secondary | ICD-10-CM

## 2014-01-20 LAB — CBC
HEMATOCRIT: 41.8 % (ref 39.0–52.0)
HEMOGLOBIN: 13.9 g/dL (ref 13.0–17.0)
MCH: 27.7 pg (ref 26.0–34.0)
MCHC: 33.3 g/dL (ref 30.0–36.0)
MCV: 83.3 fL (ref 78.0–100.0)
Platelets: 324 10*3/uL (ref 150–400)
RBC: 5.02 MIL/uL (ref 4.22–5.81)
RDW: 13.2 % (ref 11.5–15.5)
WBC: 4.5 10*3/uL (ref 4.0–10.5)

## 2014-01-20 LAB — BASIC METABOLIC PANEL
BUN: 15 mg/dL (ref 6–23)
CHLORIDE: 100 meq/L (ref 96–112)
CO2: 32 meq/L (ref 19–32)
Calcium: 9.6 mg/dL (ref 8.4–10.5)
Creat: 1.17 mg/dL (ref 0.50–1.35)
Glucose, Bld: 100 mg/dL — ABNORMAL HIGH (ref 70–99)
Potassium: 4.7 mEq/L (ref 3.5–5.3)
SODIUM: 138 meq/L (ref 135–145)

## 2014-01-20 MED ORDER — MECLIZINE HCL 12.5 MG PO TABS: 12.5000 mg | ORAL_TABLET | Freq: Three times a day (TID) | ORAL | Status: DC | PRN

## 2014-01-20 NOTE — Progress Notes (Signed)
Pre visit review using our clinic review tool, if applicable. No additional management support is needed unless otherwise documented below in the visit note/SLS  

## 2014-01-20 NOTE — Assessment & Plan Note (Signed)
+   Dix-Hallpike.  Rx Meclizine for vertigo and nausea.  Encourage patient to stay well hydrated.  Encourages more frequent eating.  Will obtain CBC and BMP.  Referral placed to PT for Vestibular Rehabilitation giving chronicity of symptoms.

## 2014-01-20 NOTE — Progress Notes (Signed)
Patient presents to clinic today c/o intermittent episodes of dizziness that occur with standing and with turning head quickly.  Patient denies ear pressure, sinus pressure, or sinus pain.  Denies decrease in vision.  Is taking BP medications as directed.  Does not eat regularly.  Is not getting enough fluid intake. Endorses nausea during dizzy spell.  No emesis.  Spells last a couple of minutes.    Patient also endorses intermittent chest pressure and discomfort not associated with these dizzy episodes.  Pressure is located in the central chest and is non-radiating.  BP normotensive in clinic today. Patient currently on Zocor 40 mg daily and a daily 81 mg ASA.  Past Medical History  Diagnosis Date  . Chicken pox   . GERD (gastroesophageal reflux disease)   . Measles   . [place] measles   . Mumps   . Hyperlipidemia   . Edema     Left Leg  . Essential hypertension, benign   . Blind right eye   . Heart attack   . Chronic back pain   . Dislocation, jaw 1960s  . Personal history of other diseases of digestive system 08.10.12    Gastric ulcer  . 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 Osteopenia 11.12.10    bone density test  . 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 Unstable angina     Current Outpatient Prescriptions on File Prior to Visit  Medication Sig Dispense Refill  . b complex vitamins tablet Take 1 tablet by mouth daily.      . Calcium Carbonate-Vitamin D (CALCIUM-VITAMIN D3 PO) Take by mouth daily.      . hydrochlorothiazide (MICROZIDE) 12.5 MG capsule TAKE 1 CAPSULE EVERY DAY  90 capsule  0  . HYDROcodone-acetaminophen (NORCO) 10-325 MG per tablet Take 1 tablet by mouth every 8 (eight) hours as needed.  40 tablet  0  . methocarbamol (ROBAXIN) 500 MG tablet Take 1 tablet (500 mg total) by mouth 3 (three) times daily.  90 tablet  0  . omeprazole (PRILOSEC) 40 MG capsule TAKE 1 CAPSULE EVERY DAY  90 capsule  0  . simvastatin (ZOCOR) 40 MG tablet TAKE 1 TABLET EVERY  EVENING  90 tablet  0  . timolol (TIMOPTIC) 0.5 % ophthalmic solution Place 1 drop into the left eye daily.  10 mL  12   No current facility-administered medications on file prior to visit.    No Known Allergies  Family History  Problem Relation Age of Onset  . Alzheimer's disease Mother     Deceased  . Diabetes Maternal Grandmother   . Heart attack Maternal Grandmother   . Prostate cancer Maternal Uncle   . Diabetes Maternal Uncle   . Healthy Son   . Cataracts Maternal Aunt     History   Social History  . Marital Status: Single    Spouse Name: N/A    Number of Children: N/A  . Years of Education: N/A   Social History Main Topics  . Smoking status: Former Smoker    Quit date: 06/02/1966  . Smokeless tobacco: Never Used  . Alcohol Use: No  . Drug Use: No  . Sexual Activity: None   Other Topics Concern  . None   Social History Narrative  . None   Review of Systems - See HPI.  All other ROS are negative.  BP 116/74  Pulse 66  Temp(Src) 98.1 F (36.7 C) (Oral)  Resp 16  Ht 5' 5.25" (1.657 m)  Wt  184 lb (83.462 kg)  BMI 30.40 kg/m2  SpO2 95%  Physical Exam  Vitals reviewed. Constitutional: He is oriented to person, place, and time and well-developed, well-nourished, and in no distress.  HENT:  Head: Normocephalic and atraumatic.  Right Ear: Tympanic membrane, external ear and ear canal normal.  Left Ear: Tympanic membrane, external ear and ear canal normal.  Nose: Nose normal. Right sinus exhibits no maxillary sinus tenderness and no frontal sinus tenderness. Left sinus exhibits no maxillary sinus tenderness and no frontal sinus tenderness.  Mouth/Throat: Uvula is midline and mucous membranes are normal. No oropharyngeal exudate.  Optician, dispensing with nystagmus  Eyes:  Patient completely blind in R eye.  L eye PERRLA.  EOM intact.  Neck: Trachea normal. Neck supple. Normal carotid pulses present. Carotid bruit is not present. No mass and no  thyromegaly present.  Cardiovascular: Normal rate, regular rhythm, normal heart sounds and intact distal pulses.   Pulmonary/Chest: Effort normal and breath sounds normal. No respiratory distress. He has no wheezes. He has no rales. He exhibits no mass, no tenderness, no bony tenderness, no laceration and no deformity.  Lymphadenopathy:    He has no cervical adenopathy.  Neurological: He is alert and oriented to person, place, and time.  Skin: Skin is warm and dry. No rash noted.  Psychiatric: Affect normal.   Assessment/Plan: Chest pressure EKG reveals NSR.  Orthostatics are negative.  Patient to continue BP medications and 81 mg ASA.  Order placed for a Lexi Scan Myoview as patient would not be able to tolerate EST due to history of back pain.  Discussed alarm symptoms.  Patient to proceed to ER if symptoms recur before workup is complete.  BPPV (benign paroxysmal positional vertigo) + Dix-Hallpike.  Rx Meclizine for vertigo and nausea.  Encourage patient to stay well hydrated.  Encourages more frequent eating.  Will obtain CBC and BMP.  Referral placed to PT for Vestibular Rehabilitation giving chronicity of symptoms.

## 2014-01-20 NOTE — Assessment & Plan Note (Signed)
EKG reveals NSR.  Orthostatics are negative.  Patient to continue BP medications and 81 mg ASA.  Order placed for a Lexi Scan Myoview as patient would not be able to tolerate EST due to history of back pain.  Discussed alarm symptoms.  Patient to proceed to ER if symptoms recur before workup is complete.

## 2014-01-20 NOTE — Patient Instructions (Signed)
Please take Meclizine up to three times daily for nausea and dizziness.  You will be contacted by Physical Therapy for Rehabilitation to help with your positional vertigo.  Please start taking a baby Aspirin (81 mg) daily. Your EKG looks good, but giving history of intermittent chest pressure/discomfort, you will be contacted for a Nuclear Stress Test to further evaluate.  Please stop by the lab for blood work before leaving.  Continue other medications as directed. Increase your fluid intake.  Do not skip meals.  If symptoms acutely worsen, please go to the ER.

## 2014-01-26 ENCOUNTER — Encounter (HOSPITAL_COMMUNITY): Payer: MEDICARE

## 2014-01-30 ENCOUNTER — Ambulatory Visit: Payer: MEDICARE | Admitting: Rehabilitation

## 2014-01-31 ENCOUNTER — Ambulatory Visit (HOSPITAL_COMMUNITY): Payer: MEDICARE | Attending: Cardiovascular Disease | Admitting: Radiology

## 2014-01-31 VITALS — BP 127/77 | HR 59 | Ht 65.5 in | Wt 185.0 lb

## 2014-01-31 DIAGNOSIS — Z87891 Personal history of nicotine dependence: Secondary | ICD-10-CM | POA: Diagnosis not present

## 2014-01-31 DIAGNOSIS — R42 Dizziness and giddiness: Secondary | ICD-10-CM | POA: Diagnosis not present

## 2014-01-31 DIAGNOSIS — E785 Hyperlipidemia, unspecified: Secondary | ICD-10-CM | POA: Insufficient documentation

## 2014-01-31 DIAGNOSIS — R0789 Other chest pain: Secondary | ICD-10-CM | POA: Diagnosis present

## 2014-01-31 DIAGNOSIS — R0602 Shortness of breath: Secondary | ICD-10-CM | POA: Diagnosis not present

## 2014-01-31 DIAGNOSIS — I2510 Atherosclerotic heart disease of native coronary artery without angina pectoris: Secondary | ICD-10-CM | POA: Insufficient documentation

## 2014-01-31 MED ORDER — TECHNETIUM TC 99M SESTAMIBI GENERIC - CARDIOLITE
11.0000 | Freq: Once | INTRAVENOUS | Status: AC | PRN
  Administered 2014-01-31: 11 via INTRAVENOUS

## 2014-01-31 MED ORDER — TECHNETIUM TC 99M SESTAMIBI GENERIC - CARDIOLITE
33.0000 | Freq: Once | INTRAVENOUS | Status: AC | PRN
  Administered 2014-01-31: 33 via INTRAVENOUS

## 2014-01-31 MED ORDER — REGADENOSON 0.4 MG/5ML IV SOLN
0.4000 mg | Freq: Once | INTRAVENOUS | Status: AC
  Administered 2014-01-31: 0.4 mg via INTRAVENOUS

## 2014-01-31 NOTE — Progress Notes (Signed)
[name] [address] [place], [place] [address] [phone]    Cardiology Nuclear Med Study  Brian [name] is a 68 y.o. male     MRN : [id number]     DOB: 1945-06-23  Procedure Date: 01/31/2014  Nuclear Med Background Indication for Stress Test:  Evaluation for Ischemia History:  CAD; MI Cardiac Risk Factors: History of Smoking and Lipids  Symptoms:  Chest Pain (last date of chest discomfort was this morning), Dizziness and SOB   Nuclear Pre-Procedure Caffeine/Decaff Intake:  None NPO After: 9:00pm   Lungs:  clear O2 Sat: 97% on room air. IV 0.9% NS with Angio Cath:  22g  IV Site: R Hand  IV Started by:  Annye Rusk, CNMT  Chest Size (in):  44 Cup Size: n/a  Height: 5' 5.5" (1.664 m)  Weight:  185 lb (83.915 kg)  BMI:  Body mass index is 30.31 kg/(m^2). Tech Comments:  No meds today    Nuclear Med Study 1 or 2 day study: 1 day  Stress Test Type:  Carlton Adam  Reading MD: Wells Guiles, MD  Order Authorizing Provider:  S. Charlett Blake, MD  Resting Radionuclide: Technetium 50m Sestamibi  Resting Radionuclide Dose: 11.0 mCi   Stress Radionuclide:  Technetium 73m Sestamibi  Stress Radionuclide Dose: 33.0 mCi           Stress Protocol Rest HR: 59 Stress HR: 80  Rest BP: 127/77 Stress BP: 81/55  Exercise Time (min): n/a METS: n/a           Dose of Adenosine (mg):  n/a Dose of Lexiscan: 0.4 mg  Dose of Atropine (mg): n/a Dose of Dobutamine: n/a mcg/kg/min (at max HR)  Stress Test Technologist: Glade Lloyd, BS-ES  Nuclear Technologist:  Annye Rusk, CNMT     Rest Procedure:  Myocardial perfusion imaging was performed at rest 45 minutes following the intravenous administration of Technetium 67m Sestamibi. Rest ECG: NSR - Normal EKG  Stress Procedure:  The patient received IV Lexiscan 0.4 mg over 15-seconds.  Technetium 38m Sestamibi injected at 30-seconds.  Quantitative spect images were obtained after a 45 minute delay.  During the infusion of  Lexiscan the patient became dizzy, SOB and had a cough.  In recovery his symptoms resolved.  Stress ECG: No significant change from baseline ECG  QPS Raw Data Images:  Normal; no motion artifact; normal heart/lung ratio. Stress Images:  Normal homogeneous uptake in all areas of the myocardium. Rest Images:  Normal homogeneous uptake in all areas of the myocardium. Subtraction (SDS):  No evidence of ischemia. Transient Ischemic Dilatation (Normal <1.22):  1.13 Lung/Heart Ratio (Normal <0.45):  0.30  Quantitative Gated Spect Images QGS EDV:  79 ml QGS ESV:  35 ml  Impression Exercise Capacity:  Lexiscan with no exercise. BP Response:  Hypotensive blood pressure response. Clinical Symptoms:  Dizziness ECG Impression:  No significant ECG changes with Lexiscan. Comparison with Prior Nuclear Study: No images to compare  Overall Impression:  Normal stress nuclear study.  LV Ejection Fraction: 55%.  LV Wall Motion:  Normal Wall Motion  Pixie Casino, MD, [facility] Board Certified in Nuclear Cardiology Attending Cardiologist [place]

## 2014-02-01 ENCOUNTER — Ambulatory Visit: Payer: MEDICARE | Attending: Physician Assistant | Admitting: Rehabilitation

## 2014-02-01 ENCOUNTER — Telehealth: Payer: Self-pay | Admitting: Physician Assistant

## 2014-02-01 DIAGNOSIS — R269 Unspecified abnormalities of gait and mobility: Secondary | ICD-10-CM | POA: Insufficient documentation

## 2014-02-01 DIAGNOSIS — H814 Vertigo of central origin: Secondary | ICD-10-CM | POA: Insufficient documentation

## 2014-02-01 DIAGNOSIS — R0789 Other chest pain: Secondary | ICD-10-CM

## 2014-02-01 DIAGNOSIS — IMO0001 Reserved for inherently not codable concepts without codable children: Secondary | ICD-10-CM | POA: Diagnosis not present

## 2014-02-01 NOTE — Telephone Encounter (Signed)
Pt would like a chest xray , prefers xray to be at [facility] but wants to wait on seeing a specialist at this time.

## 2014-02-01 NOTE — Telephone Encounter (Signed)
Pt to have it done tomorrow morning.

## 2014-02-01 NOTE — Telephone Encounter (Signed)
Please inform patient that his Nuclear Stress Test looks good.  No explanation for chest pressure that he was feeling.  If chest pressure is persisting, I would like for him to come in for a Chest X-Ray and allow [place] to set him up with a specialist.  Please let me know what patient decides.

## 2014-02-01 NOTE — Telephone Encounter (Signed)
Chest x-ray ordered.  Patient can have done here at [facility] at his earliest convenience.

## 2014-02-02 ENCOUNTER — Ambulatory Visit (HOSPITAL_BASED_OUTPATIENT_CLINIC_OR_DEPARTMENT_OTHER)
Admission: RE | Admit: 2014-02-02 | Discharge: 2014-02-02 | Disposition: A | Discharge status: Home / Self Care | Payer: MEDICARE | Source: Ambulatory Visit | Attending: Physician Assistant | Admitting: Physician Assistant

## 2014-02-02 ENCOUNTER — Telehealth: Payer: Self-pay | Admitting: Physician Assistant

## 2014-02-02 DIAGNOSIS — R0789 Other chest pain: Secondary | ICD-10-CM | POA: Diagnosis present

## 2014-02-02 DIAGNOSIS — R0602 Shortness of breath: Secondary | ICD-10-CM | POA: Diagnosis not present

## 2014-02-02 NOTE — Telephone Encounter (Signed)
Message copied by Raiford Noble on Thu Feb 02, 2014  9:49 AM ------      Message from: Gustavo Lah, DAVID J      Created: Thu Feb 02, 2014  9:34 AM       Pt notified of results verbalized understanding- pt agrees to see a pulmonologist ------

## 2014-02-02 NOTE — Telephone Encounter (Signed)
Referral placed.  Patient will be contacted by specialist.

## 2014-02-07 ENCOUNTER — Ambulatory Visit: Payer: MEDICARE | Admitting: Physical Therapy

## 2014-02-07 DIAGNOSIS — IMO0001 Reserved for inherently not codable concepts without codable children: Secondary | ICD-10-CM | POA: Diagnosis not present

## 2014-02-08 ENCOUNTER — Institutional Professional Consult (permissible substitution): Payer: MEDICARE | Admitting: Internal Medicine

## 2014-02-09 ENCOUNTER — Ambulatory Visit: Payer: MEDICARE | Admitting: Physical Therapy

## 2014-02-09 DIAGNOSIS — IMO0001 Reserved for inherently not codable concepts without codable children: Secondary | ICD-10-CM | POA: Diagnosis not present

## 2014-02-13 ENCOUNTER — Ambulatory Visit: Payer: MEDICARE | Admitting: Physical Therapy

## 2014-02-15 ENCOUNTER — Ambulatory Visit: Payer: MEDICARE | Admitting: Physical Therapy

## 2014-02-15 DIAGNOSIS — IMO0001 Reserved for inherently not codable concepts without codable children: Secondary | ICD-10-CM | POA: Diagnosis not present

## 2014-02-16 ENCOUNTER — Other Ambulatory Visit: Payer: Self-pay | Admitting: Physician Assistant

## 2014-02-20 ENCOUNTER — Ambulatory Visit: Payer: MEDICARE | Admitting: Physical Therapy

## 2014-02-23 ENCOUNTER — Ambulatory Visit: Payer: MEDICARE | Admitting: Physical Therapy

## 2014-02-27 ENCOUNTER — Ambulatory Visit: Payer: MEDICARE | Admitting: Physical Therapy

## 2014-03-01 ENCOUNTER — Encounter: Payer: Self-pay | Admitting: Physician Assistant

## 2014-03-01 ENCOUNTER — Ambulatory Visit (INDEPENDENT_AMBULATORY_CARE_PROVIDER_SITE_OTHER): Payer: MEDICARE | Admitting: Physician Assistant

## 2014-03-01 VITALS — BP 123/75 | HR 77 | Temp 98.3°F | Resp 16 | Ht 65.25 in | Wt 184.5 lb

## 2014-03-01 DIAGNOSIS — M62838 Other muscle spasm: Secondary | ICD-10-CM

## 2014-03-01 DIAGNOSIS — M5430 Sciatica, unspecified side: Secondary | ICD-10-CM

## 2014-03-01 DIAGNOSIS — M5441 Lumbago with sciatica, right side: Secondary | ICD-10-CM

## 2014-03-01 MED ORDER — METHOCARBAMOL 500 MG PO TABS: 500.0000 mg | ORAL_TABLET | Freq: Three times a day (TID) | ORAL | Status: DC

## 2014-03-01 MED ORDER — HYDROCODONE-ACETAMINOPHEN 10-325 MG PO TABS: 1.0000 | ORAL_TABLET | Freq: Three times a day (TID) | ORAL | Status: DC | PRN

## 2014-03-01 NOTE — Progress Notes (Signed)
Pre visit review using our clinic review tool, if applicable. No additional management support is needed unless otherwise documented below in the visit note/SLS  

## 2014-03-01 NOTE — Progress Notes (Signed)
Patient presents to clinic today c/o exacerbation of chronic low back pain.  Patient with history of disc herniation seeing Neurosurgery who recommends injections.  Patient refuses.  Patient endorses r-sided low back pain with radiation into right lower extremity.  Denies saddle anesthesia, change to bowel or bladder habits. Patient unable to tolerate steroids as they make him dizzy.  Past Medical History  Diagnosis Date  . Chicken pox   . GERD (gastroesophageal reflux disease)   . Measles   . [place] measles   . Mumps   . Hyperlipidemia   . Edema     Left Leg  . Essential hypertension, benign   . Blind right eye   . Heart attack   . Chronic back pain   . Dislocation, jaw 1960s  . Personal history of other diseases of digestive system 08.10.12    Gastric ulcer  . 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 Osteopenia 11.12.10    bone density test  . 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 Unstable angina     Current Outpatient Prescriptions on File Prior to Visit  Medication Sig Dispense Refill  . b complex vitamins tablet Take 1 tablet by mouth daily.      . Calcium Carbonate-Vitamin D (CALCIUM-VITAMIN D3 PO) Take by mouth daily.      . hydrochlorothiazide (MICROZIDE) 12.5 MG capsule TAKE 1 CAPSULE EVERY DAY  90 capsule  0  . omeprazole (PRILOSEC) 40 MG capsule TAKE 1 CAPSULE EVERY DAY  90 capsule  0  . simvastatin (ZOCOR) 40 MG tablet TAKE 1 TABLET EVERY EVENING  90 tablet  0  . timolol (TIMOPTIC) 0.5 % ophthalmic solution Place 1 drop into the left eye daily.  10 mL  12   No current facility-administered medications on file prior to visit.    No Known Allergies  Family History  Problem Relation Age of Onset  . Alzheimer's disease Mother     Deceased  . Diabetes Maternal Grandmother   . Heart attack Maternal Grandmother   . Prostate cancer Maternal Uncle   . Diabetes Maternal Uncle   . Healthy Son   . Cataracts Maternal Aunt     History   Social  History  . Marital Status: Single    Spouse Name: N/A    Number of Children: N/A  . Years of Education: N/A   Social History Main Topics  . Smoking status: Former Smoker    Quit date: 06/02/1966  . Smokeless tobacco: Never Used  . Alcohol Use: No  . Drug Use: No  . Sexual Activity: None   Other Topics Concern  . None   Social History Narrative  . None   Review of Systems - See HPI.  All other ROS are negative.  BP 123/75  Pulse 77  Temp(Src) 98.3 F (36.8 C) (Oral)  Resp 16  Ht 5' 5.25" (1.657 m)  Wt 184 lb 8 oz (83.689 kg)  BMI 30.48 kg/m2  SpO2 97%  Physical Exam  Vitals reviewed. Constitutional: He is oriented to person, place, and time and well-developed, well-nourished, and in no distress.  HENT:  Head: Normocephalic and atraumatic.  Eyes: Conjunctivae are normal.  Cardiovascular: Normal rate, regular rhythm, normal heart sounds and intact distal pulses.   Pulmonary/Chest: Effort normal and breath sounds normal. No respiratory distress. He has no wheezes. He has no rales. He exhibits no tenderness.  Musculoskeletal:       Lumbar back: He exhibits tenderness and pain. He exhibits normal  range of motion, no bony tenderness and no swelling.  Neurological: He is alert and oriented to person, place, and time.  Skin: Skin is warm and dry. No rash noted.  Psychiatric: Affect normal.   Recent Results (from the past 2160 hour(s))  CBC     Status: None   Collection Time    01/20/14 11:38 AM      Result Value Ref Range   WBC 4.5  4.0 - 10.5 K/uL   RBC 5.02  4.22 - 5.81 MIL/uL   Hemoglobin 13.9  13.0 - 17.0 g/dL   HCT 41.8  39.0 - 52.0 %   MCV 83.3  78.0 - 100.0 fL   MCH 27.7  26.0 - 34.0 pg   MCHC 33.3  30.0 - 36.0 g/dL   RDW 13.2  11.5 - 15.5 %   Platelets 324  150 - 400 K/uL  BASIC METABOLIC PANEL     Status: Abnormal   Collection Time    01/20/14 11:38 AM      Result Value Ref Range   Sodium 138  135 - 145 mEq/L   Potassium 4.7  3.5 - 5.3 mEq/L    Chloride 100  96 - 112 mEq/L   CO2 32  19 - 32 mEq/L   Glucose, Bld 100 (*) 70 - 99 mg/dL   BUN 15  6 - 23 mg/dL   Creat 1.17  0.50 - 1.35 mg/dL   Calcium 9.6  8.4 - 10.5 mg/dL    Assessment/Plan: Right-sided low back pain with right-sided sciatica Cannot tolerate steroids.  Norco and Robaxin refilled.  Patient already in Physical therapy for BPPV.  Will have them assess for strengthening exercises. Encouraged patient to reconsider injections due to recurrent symptoms.

## 2014-03-01 NOTE — Patient Instructions (Signed)
Please take norco and robaxin as directed.  Do not drive while on these medications.  Avoid heavy lifting or overexertion. Apply topical Aspercreme to lower back.  I will call your Physical Therapist so they can add strengthening exercises to your regimen.  You will be contacted for an appointment with the lung specialist here at [facility].  Call or return to clinic if symptoms are not improving.

## 2014-03-01 NOTE — Assessment & Plan Note (Signed)
Cannot tolerate steroids.  Norco and Robaxin refilled.  Patient already in Physical therapy for BPPV.  Will have them assess for strengthening exercises. Encouraged patient to reconsider injections due to recurrent symptoms.

## 2014-03-02 ENCOUNTER — Ambulatory Visit: Payer: MEDICARE | Admitting: Physical Therapy

## 2014-03-02 ENCOUNTER — Telehealth: Payer: Self-pay | Admitting: *Deleted

## 2014-03-02 NOTE — Telephone Encounter (Signed)
PA for Methocarbamol 500 mg tablets initiated on [email or web address]. Awaiting outcome. JG//CMA

## 2014-03-03 NOTE — Telephone Encounter (Signed)
PA for Methocarbamol 500 mg tablets approved through 06/01/2014. Approval letter sent for scanning. JG//CMA

## 2014-03-06 ENCOUNTER — Encounter: Payer: MEDICARE | Admitting: Physical Therapy

## 2014-03-08 ENCOUNTER — Encounter: Payer: MEDICARE | Admitting: Physical Therapy

## 2014-03-23 ENCOUNTER — Ambulatory Visit: Payer: MEDICARE | Attending: Physician Assistant

## 2014-03-23 DIAGNOSIS — Z5189 Encounter for other specified aftercare: Secondary | ICD-10-CM | POA: Insufficient documentation

## 2014-03-23 DIAGNOSIS — H8149 Vertigo of central origin, unspecified ear: Secondary | ICD-10-CM | POA: Insufficient documentation

## 2014-03-23 DIAGNOSIS — R269 Unspecified abnormalities of gait and mobility: Secondary | ICD-10-CM | POA: Insufficient documentation

## 2014-03-24 ENCOUNTER — Ambulatory Visit (HOSPITAL_BASED_OUTPATIENT_CLINIC_OR_DEPARTMENT_OTHER)
Admission: RE | Admit: 2014-03-24 | Discharge: 2014-03-24 | Disposition: A | Discharge status: Home / Self Care | Payer: MEDICARE | Source: Ambulatory Visit | Attending: Physician Assistant | Admitting: Physician Assistant

## 2014-03-24 ENCOUNTER — Ambulatory Visit (INDEPENDENT_AMBULATORY_CARE_PROVIDER_SITE_OTHER): Payer: MEDICARE | Admitting: Physician Assistant

## 2014-03-24 ENCOUNTER — Encounter: Payer: Self-pay | Admitting: Physician Assistant

## 2014-03-24 VITALS — BP 128/79 | HR 60 | Temp 98.1°F | Resp 16 | Ht 65.25 in | Wt 187.0 lb

## 2014-03-24 DIAGNOSIS — M546 Pain in thoracic spine: Secondary | ICD-10-CM

## 2014-03-24 DIAGNOSIS — M419 Scoliosis, unspecified: Secondary | ICD-10-CM | POA: Diagnosis not present

## 2014-03-24 DIAGNOSIS — I7090 Unspecified atherosclerosis: Secondary | ICD-10-CM | POA: Insufficient documentation

## 2014-03-24 DIAGNOSIS — M5441 Lumbago with sciatica, right side: Secondary | ICD-10-CM

## 2014-03-24 DIAGNOSIS — M5030 Other cervical disc degeneration, unspecified cervical region: Secondary | ICD-10-CM | POA: Diagnosis not present

## 2014-03-24 DIAGNOSIS — M5489 Other dorsalgia: Secondary | ICD-10-CM | POA: Diagnosis present

## 2014-03-24 NOTE — Assessment & Plan Note (Signed)
Will obtain X-ray to further assess. Refusing pain medications at present.  Continue supportive measures. Further treatment pending results.

## 2014-03-24 NOTE — Progress Notes (Signed)
Pre visit review using our clinic review tool, if applicable. No additional management support is needed unless otherwise documented below in the visit note/SLS  

## 2014-03-24 NOTE — Progress Notes (Signed)
Patient presents to clinic today c/o fall in the yard two days ago, when he slipped on something and fell into shrubs and onto the ground.  Denies head injury.  Patient endorses R leg and lower back pain. Patient with history significant for disc herniation and radicular pain of low back.  Refuses Neurosurgery recommendations for injections/surgery. Patient currently seeing PT for chronic back issues. Last visit,  PT applied TENS unit on lower back with good improvement in symptoms. Patient also c/o thoracic back pain of midline. Patient refusing additional medications.  States last dose of hydrocodone made his throat begin to swell.  Past Medical History  Diagnosis Date  . Chicken pox   . GERD (gastroesophageal reflux disease)   . Measles   . [place] measles   . Mumps   . Hyperlipidemia   . Edema     Left Leg  . Essential hypertension, benign   . Blind right eye   . Heart attack   . Chronic back pain   . Dislocation, jaw 1960s  . Personal history of other diseases of digestive system 08.10.12    Gastric ulcer  . 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 Osteopenia 11.12.10    bone density test  . 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 Unstable angina     Current Outpatient Prescriptions on File Prior to Visit  Medication Sig Dispense Refill  . b complex vitamins tablet Take 1 tablet by mouth daily.      . Calcium Carbonate-Vitamin D (CALCIUM-VITAMIN D3 PO) Take by mouth daily.      . hydrochlorothiazide (MICROZIDE) 12.5 MG capsule TAKE 1 CAPSULE EVERY DAY  90 capsule  0  . methocarbamol (ROBAXIN) 500 MG tablet Take 1 tablet (500 mg total) by mouth 3 (three) times daily.  90 tablet  0  . omeprazole (PRILOSEC) 40 MG capsule TAKE 1 CAPSULE EVERY DAY  90 capsule  0  . simvastatin (ZOCOR) 40 MG tablet TAKE 1 TABLET EVERY EVENING  90 tablet  0  . timolol (TIMOPTIC) 0.5 % ophthalmic solution Place 1 drop into the left eye daily.  10 mL  12   No current  facility-administered medications on file prior to visit.    No Known Allergies  Family History  Problem Relation Age of Onset  . Alzheimer's disease Mother     Deceased  . Diabetes Maternal Grandmother   . Heart attack Maternal Grandmother   . Prostate cancer Maternal Uncle   . Diabetes Maternal Uncle   . Healthy Son   . Cataracts Maternal Aunt     History   Social History  . Marital Status: Single    Spouse Name: N/A    Number of Children: N/A  . Years of Education: N/A   Social History Main Topics  . Smoking status: Former Smoker    Quit date: 06/02/1966  . Smokeless tobacco: Never Used  . Alcohol Use: No  . Drug Use: No  . Sexual Activity: None   Other Topics Concern  . None   Social History Narrative  . None   Review of Systems - See HPI.  All other ROS are negative.  BP 128/79  Pulse 60  Temp(Src) 98.1 F (36.7 C) (Oral)  Resp 16  Ht 5' 5.25" (1.657 m)  Wt 187 lb (84.823 kg)  BMI 30.89 kg/m2  SpO2 99%  Physical Exam  Vitals reviewed. Constitutional: He is oriented to person, place, and time and well-developed, well-nourished, and in no distress.  HENT:  Head: Normocephalic and atraumatic.  Cardiovascular: Normal rate, regular rhythm, normal heart sounds and intact distal pulses.   Pulmonary/Chest: Effort normal and breath sounds normal. No respiratory distress. He has no wheezes. He has no rales. He exhibits no tenderness.  Musculoskeletal:       Right hip: Normal.       Right knee: Normal.       Thoracic back: He exhibits tenderness, bony tenderness, pain and spasm.       Lumbar back: He exhibits tenderness, pain and spasm. He exhibits no bony tenderness.  Neurological: He is alert and oriented to person, place, and time.  Skin: Skin is warm and dry. No rash noted.  Psychiatric: Affect normal.    Recent Results (from the past 2160 hour(s))  CBC     Status: None   Collection Time    01/20/14 11:38 AM      Result Value Ref Range   WBC 4.5   4.0 - 10.5 K/uL   RBC 5.02  4.22 - 5.81 MIL/uL   Hemoglobin 13.9  13.0 - 17.0 g/dL   HCT 41.8  39.0 - 52.0 %   MCV 83.3  78.0 - 100.0 fL   MCH 27.7  26.0 - 34.0 pg   MCHC 33.3  30.0 - 36.0 g/dL   RDW 13.2  11.5 - 15.5 %   Platelets 324  150 - 400 K/uL  BASIC METABOLIC PANEL     Status: Abnormal   Collection Time    01/20/14 11:38 AM      Result Value Ref Range   Sodium 138  135 - 145 mEq/L   Potassium 4.7  3.5 - 5.3 mEq/L   Chloride 100  96 - 112 mEq/L   CO2 32  19 - 32 mEq/L   Glucose, Bld 100 (*) 70 - 99 mg/dL   BUN 15  6 - 23 mg/dL   Creat 1.17  0.50 - 1.35 mg/dL   Calcium 9.6  8.4 - 10.5 mg/dL    Assessment/Plan: Right-sided low back pain with right-sided sciatica Norco dc'd due to side effect.  Continue PT.  Patient refusing medication and steroids at present.  Encouraged RICE.  OTC pain medications. Schedule appointment with Neurosurgery for other treatment options, giving previous MRI findings and recurrence of symptoms.  Midline thoracic back pain Will obtain X-ray to further assess. Refusing pain medications at present.  Continue supportive measures. Further treatment pending results.

## 2014-03-24 NOTE — Patient Instructions (Signed)
Please read instructions below. Please reconsider speaking to your neurosurgeon about treatment options for your recurrent sciatica.  Continue Physical Therapy.  Do not forget to go downstairs for x-ray.  Apply topical Aspercreme to your back. Continue over-the-counter pain medications.  Sciatica Sciatica is pain, weakness, numbness, or tingling along the path of the sciatic nerve. The nerve starts in the lower back and runs down the back of each leg. The nerve controls the muscles in the lower leg and in the back of the knee, while also providing sensation to the back of the thigh, lower leg, and the sole of your foot. Sciatica is a symptom of another medical condition. For instance, nerve damage or certain conditions, such as a herniated disk or bone spur on the spine, pinch or put pressure on the sciatic nerve. This causes the pain, weakness, or other sensations normally associated with sciatica. Generally, sciatica only affects one side of the body. CAUSES   Herniated or slipped disc.  Degenerative disk disease.  A pain disorder involving the narrow muscle in the buttocks (piriformis syndrome).  Pelvic injury or fracture.  Pregnancy.  Tumor (rare). SYMPTOMS  Symptoms can vary from mild to very severe. The symptoms usually travel from the low back to the buttocks and down the back of the leg. Symptoms can include:  Mild tingling or dull aches in the lower back, leg, or hip.  Numbness in the back of the calf or sole of the foot.  Burning sensations in the lower back, leg, or hip.  Sharp pains in the lower back, leg, or hip.  Leg weakness.  Severe back pain inhibiting movement. These symptoms may get worse with coughing, sneezing, laughing, or prolonged sitting or standing. Also, being overweight may worsen symptoms. DIAGNOSIS  Your caregiver will perform a physical exam to look for common symptoms of sciatica. He or she may ask you to do certain movements or activities that would  trigger sciatic nerve pain. Other tests may be performed to find the cause of the sciatica. These may include:  Blood tests.  X-rays.  Imaging tests, such as an MRI or CT scan. TREATMENT  Treatment is directed at the cause of the sciatic pain. Sometimes, treatment is not necessary and the pain and discomfort goes away on its own. If treatment is needed, your caregiver may suggest:  Over-the-counter medicines to relieve pain.  Prescription medicines, such as anti-inflammatory medicine, muscle relaxants, or narcotics.  Applying heat or ice to the painful area.  Steroid injections to lessen pain, irritation, and inflammation around the nerve.  Reducing activity during periods of pain.  Exercising and stretching to strengthen your abdomen and improve flexibility of your spine. Your caregiver may suggest losing weight if the extra weight makes the back pain worse.  Physical therapy.  Surgery to eliminate what is pressing or pinching the nerve, such as a bone spur or part of a herniated disk. HOME CARE INSTRUCTIONS   Only take over-the-counter or prescription medicines for pain or discomfort as directed by your caregiver.  Apply ice to the affected area for 20 minutes, 3-4 times a day for the first 48-72 hours. Then try heat in the same way.  Exercise, stretch, or perform your usual activities if these do not aggravate your pain.  Attend physical therapy sessions as directed by your caregiver.  Keep all follow-up appointments as directed by your caregiver.  Do not wear high heels or shoes that do not provide proper support.  Check your mattress to  see if it is too soft. A firm mattress may lessen your pain and discomfort. SEEK IMMEDIATE MEDICAL CARE IF:   You lose control of your bowel or bladder (incontinence).  You have increasing weakness in the lower back, pelvis, buttocks, or legs.  You have redness or swelling of your back.  You have a burning sensation when you  urinate.  You have pain that gets worse when you lie down or awakens you at night.  Your pain is worse than you have experienced in the past.  Your pain is lasting longer than 4 weeks.  You are suddenly losing weight without reason. MAKE SURE YOU:  Understand these instructions.  Will watch your condition.  Will get help right away if you are not doing well or get worse. Document Released: 05/13/2001 Document Revised: 11/18/2011 Document Reviewed: 09/28/2011 [facility] Patient Information 2015 [place], [place]. This information is not intended to replace advice given to you by your health care provider. Make sure you discuss any questions you have with your health care provider.  RICE: Routine Care for Injuries The routine care of many injuries includes Rest, Ice, Compression, and Elevation (RICE). HOME CARE INSTRUCTIONS  Rest is needed to allow your body to heal. Routine activities can usually be resumed when comfortable. Injured tendons and bones can take up to 6 weeks to heal. Tendons are the cord-like structures that attach muscle to bone.  Ice following an injury helps keep the swelling down and reduces pain.  Put ice in a plastic bag.  Place a towel between your skin and the bag.  Leave the ice on for 15-20 minutes, 3-4 times a day, or as directed by your health care provider. Do this while awake, for the first 24 to 48 hours. After that, continue as directed by your caregiver.  Compression helps keep swelling down. It also gives support and helps with discomfort. If an elastic bandage has been applied, it should be removed and reapplied every 3 to 4 hours. It should not be applied tightly, but firmly enough to keep swelling down. Watch fingers or toes for swelling, bluish discoloration, coldness, numbness, or excessive pain. If any of these problems occur, remove the bandage and reapply loosely. Contact your caregiver if these problems continue.  Elevation helps reduce swelling  and decreases pain. With extremities, such as the arms, hands, legs, and feet, the injured area should be placed near or above the level of the heart, if possible. SEEK IMMEDIATE MEDICAL CARE IF:  You have persistent pain and swelling.  You develop redness, numbness, or unexpected weakness.  Your symptoms are getting worse rather than improving after several days. These symptoms may indicate that further evaluation or further X-rays are needed. Sometimes, X-rays may not show a small broken bone (fracture) until 1 week or 10 days later. Make a follow-up appointment with your caregiver. Ask when your X-ray results will be ready. Make sure you get your X-ray results. Document Released: 08/31/2000 Document Revised: 05/24/2013 Document Reviewed: 10/18/2010 [facility] Patient Information 2015 [place], [place]. This information is not intended to replace advice given to you by your health care provider. Make sure you discuss any questions you have with your health care provider.

## 2014-03-24 NOTE — Assessment & Plan Note (Signed)
Norco dc'd due to side effect.  Continue PT.  Patient refusing medication and steroids at present.  Encouraged RICE.  OTC pain medications. Schedule appointment with Neurosurgery for other treatment options, giving previous MRI findings and recurrence of symptoms.

## 2014-04-04 ENCOUNTER — Ambulatory Visit: Payer: MEDICARE | Attending: Physician Assistant

## 2014-04-04 DIAGNOSIS — R269 Unspecified abnormalities of gait and mobility: Secondary | ICD-10-CM | POA: Insufficient documentation

## 2014-04-04 DIAGNOSIS — H8149 Vertigo of central origin, unspecified ear: Secondary | ICD-10-CM | POA: Diagnosis not present

## 2014-04-04 DIAGNOSIS — Z5189 Encounter for other specified aftercare: Secondary | ICD-10-CM | POA: Diagnosis not present

## 2014-04-12 ENCOUNTER — Ambulatory Visit: Payer: MEDICARE

## 2014-04-18 ENCOUNTER — Ambulatory Visit: Payer: MEDICARE

## 2014-04-18 DIAGNOSIS — Z5189 Encounter for other specified aftercare: Secondary | ICD-10-CM | POA: Diagnosis not present

## 2014-04-20 ENCOUNTER — Institutional Professional Consult (permissible substitution): Payer: MEDICARE | Admitting: Critical Care Medicine

## 2014-04-25 ENCOUNTER — Ambulatory Visit: Payer: MEDICARE | Admitting: Rehabilitation

## 2014-05-04 ENCOUNTER — Telehealth: Payer: Self-pay | Admitting: Physician Assistant

## 2014-05-04 DIAGNOSIS — Z1211 Encounter for screening for malignant neoplasm of colon: Secondary | ICD-10-CM

## 2014-05-04 NOTE — Telephone Encounter (Signed)
Caller name:Willig, Keyonte Relation to FT:DDUK Call back number:[phone] Pharmacy:  Reason for call: pt is needing a referral for his colonoscopy, pt has medicare/mutual of [place]. States he received automated message stating he was due.

## 2014-05-05 ENCOUNTER — Encounter: Payer: Self-pay | Admitting: Internal Medicine

## 2014-05-05 NOTE — Telephone Encounter (Signed)
Referral placed.

## 2014-05-11 ENCOUNTER — Ambulatory Visit (INDEPENDENT_AMBULATORY_CARE_PROVIDER_SITE_OTHER): Payer: MEDICARE | Admitting: Critical Care Medicine

## 2014-05-11 ENCOUNTER — Encounter: Payer: Self-pay | Admitting: Physician Assistant

## 2014-05-11 ENCOUNTER — Encounter: Payer: Self-pay | Admitting: Critical Care Medicine

## 2014-05-11 VITALS — BP 133/77 | HR 64 | Ht 65.25 in | Wt 188.0 lb

## 2014-05-11 DIAGNOSIS — R1314 Dysphagia, pharyngoesophageal phase: Secondary | ICD-10-CM

## 2014-05-11 DIAGNOSIS — K2100 Gastro-esophageal reflux disease with esophagitis, without bleeding: Secondary | ICD-10-CM

## 2014-05-11 DIAGNOSIS — R0789 Other chest pain: Secondary | ICD-10-CM

## 2014-05-11 LAB — D-DIMER, QUANTITATIVE: D-Dimer, Quant: 0.46 ug/mL-FEU (ref 0.00–0.48)

## 2014-05-11 MED ORDER — OMEPRAZOLE 40 MG PO CPDR: 40.0000 mg | DELAYED_RELEASE_CAPSULE | Freq: Two times a day (BID) | ORAL | Status: DC

## 2014-05-11 NOTE — Patient Instructions (Signed)
D dimer level Overnight sleep oxygen test will be obtained Increase omeprazole to twice daily 1/2 hour before meal Reflux diet Referral to GI  Full pulmonary function study We will call results

## 2014-05-11 NOTE — Progress Notes (Signed)
Quick Note:  Call pt and tell his blood clot lab is normal. His symptoms are NOT from a blood clot in the lung ______

## 2014-05-11 NOTE — Progress Notes (Signed)
Subjective:    Patient ID: Brian [name], male    DOB: [birth year], 68 y.o.   MRN: [id number]  HPI Comments: Notes chest pressure, several months.  No real cough, no real wheezing.  Notes dyspnea up stairs. No smoking since 1968.  Nuclear stress test neg.  Shortness of Breath This is a new problem. The current episode started more than 1 month ago. The problem occurs daily (exertion only, walking up incline. ). Associated symptoms include chest pain, leg pain, leg swelling and PND. Pertinent negatives include no claudication, coryza, ear pain, fever, headaches, hemoptysis, neck pain, orthopnea, rash, rhinorrhea, sore throat, sputum production, swollen glands, syncope, vomiting or wheezing. The symptoms are aggravated by any activity, exercise, fumes and odors. Associated symptoms comments: Notes chest pressure at rest, tightness and radiates to L shoulder.  . Risk factors include smoking. He has tried nothing for the symptoms. His past medical history is significant for pneumonia. There is no history of allergies, aspirin allergies, asthma, bronchiolitis, CAD, chronic lung disease, COPD, DVT, a heart failure, PE or a recent surgery. (? 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 Chicken pox   . GERD (gastroesophageal reflux disease)   . Measles   . [place] measles   . Mumps   . Hyperlipidemia   . Edema     Left Leg  . Essential hypertension, benign   . Blind right eye   . Heart attack   . Chronic back pain   . Dislocation, jaw 1960s  . Personal history of other diseases of digestive system 08.10.12    Gastric ulcer  . 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 Osteopenia 11.12.10    bone density test  . 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 Atypical chest pain      Negative cardiac catheterization 2014     Family History  Problem Relation Age of Onset  . Alzheimer's disease Mother     Deceased  . Diabetes Maternal Grandmother   .  Heart attack Maternal Grandmother   . Prostate cancer Maternal Uncle   . Diabetes Maternal Uncle   . Healthy Son   . Cataracts Maternal Aunt      History   Social History  . Marital Status: Single    Spouse Name: N/A    Number of Children: N/A  . Years of Education: N/A   Occupational History  . Not on file.   Social History Main Topics  . Smoking status: Former Smoker -- 0.25 packs/day for 2 years    Types: Cigarettes    Quit date: 06/02/1966  . Smokeless tobacco: Never Used  . Alcohol Use: No  . Drug Use: No  . Sexual Activity: Not on file   Other Topics Concern  . Not on file   Social History Narrative     No Known Allergies   Outpatient Prescriptions Prior to Visit  Medication Sig Dispense Refill  . b complex vitamins tablet Take 1 tablet by mouth daily.    . Calcium Carbonate-Vitamin D (CALCIUM-VITAMIN D3 PO) Take by mouth daily.    . hydrochlorothiazide (MICROZIDE) 12.5 MG capsule TAKE 1 CAPSULE EVERY DAY 90 capsule 0  . simvastatin (ZOCOR) 40 MG tablet TAKE 1 TABLET EVERY EVENING 90 tablet 0  . timolol (TIMOPTIC) 0.5 % ophthalmic solution Place 1 drop into the left eye daily. 10 mL 12  . omeprazole (PRILOSEC) 40 MG capsule TAKE 1 CAPSULE EVERY DAY 90  capsule 0  . methocarbamol (ROBAXIN) 500 MG tablet Take 1 tablet (500 mg total) by mouth 3 (three) times daily. (Patient not taking: Reported on 05/11/2014) 90 tablet 0   No facility-administered medications prior to visit.      Review of Systems  Constitutional: Positive for fatigue. Negative for fever and unexpected weight change.       Only sleeps a few hours per night  HENT: Positive for trouble swallowing. Negative for congestion, dental problem, ear pain, nosebleeds, postnasal drip, rhinorrhea, sinus pressure, sneezing, sore throat and voice change.   Eyes: Negative for redness and itching.  Respiratory: Positive for cough, chest tightness and shortness of breath. Negative for apnea, hemoptysis, sputum  production, choking, wheezing and stridor.   Cardiovascular: Positive for chest pain, leg swelling and PND. Negative for palpitations, orthopnea, claudication and syncope.  Gastrointestinal: Negative for nausea and vomiting.       Regurgitates at night  Notes daily heartburn  Genitourinary: Negative for dysuria.  Musculoskeletal: Positive for joint swelling. Negative for neck pain.  Skin: Negative for rash.  Neurological: Negative for headaches.  Hematological: Does not bruise/bleed easily.  Psychiatric/Behavioral: Negative for dysphoric mood. The patient is not nervous/anxious.        Objective:   Physical Exam Filed Vitals:   05/11/14 0901  BP: 133/77  Pulse: 64  Height: 5' 5.25" (1.657 m)  Weight: 188 lb (85.276 kg)  SpO2: 98%    Gen: Pleasant, well-nourished, in no distress,  normal affect  ENT: No lesions,  mouth clear,  oropharynx clear, no postnasal drip,  Evidence of bilateral prior jaw surgery  Neck: No JVD, no TMG, no carotid bruits  Lungs: No use of accessory muscles, no dullness to percussion, clear without rales or rhonchi  Cardiovascular: RRR, heart sounds normal, no murmur or gallops, no peripheral edema  Abdomen: soft and NT, no HSM,  BS normal,  Mild epigastric tenderness  Musculoskeletal: No deformities, no cyanosis or clubbing  Neuro: alert, non focal  Skin: Warm, no lesions or rashes  No results found.        Assessment & Plan:   Chest pain  Long-standing history of chest pressure. Note evaluation in 2014 at the [facility] clinic including  Left heart catheterization which revealed no coronary disease.  The patient's symptom complex is more consistent with esophageal spasm and esophagitis with reflux. Note patient has had upper endoscopy previously revealing Barrett's esophagitis.  The patient's dyspnea is extremely atypical.  note d-dimer was normal at this visit  Plan Overnight sleep oxygen test will be obtained Increase omeprazole to  twice daily 1/2 hour before meal Reflux diet Referral to GI  Full pulmonary function study    Acid reflux  History of significant acid reflux disease with chest pain syndrome  Plan  Referral to gastroenterology  Increase omeprazole to 40 mg twice daily before meals  A reflux diet was issued to the patient   Updated Medication List Outpatient Encounter Prescriptions as of 05/11/2014  Medication Sig  . b complex vitamins tablet Take 1 tablet by mouth daily.  . Calcium Carbonate-Vitamin D (CALCIUM-VITAMIN D3 PO) Take by mouth daily.  . hydrochlorothiazide (MICROZIDE) 12.5 MG capsule TAKE 1 CAPSULE EVERY DAY  . omeprazole (PRILOSEC) 40 MG capsule Take 1 capsule (40 mg total) by mouth 2 (two) times daily before a meal.  . simvastatin (ZOCOR) 40 MG tablet TAKE 1 TABLET EVERY EVENING  . timolol (TIMOPTIC) 0.5 % ophthalmic solution Place 1 drop into the left eye  daily.  . [DISCONTINUED] omeprazole (PRILOSEC) 40 MG capsule TAKE 1 CAPSULE EVERY DAY  . aspirin (ASPIR-81) 81 MG EC tablet Take 1 tablet by mouth daily.  . [DISCONTINUED] methocarbamol (ROBAXIN) 500 MG tablet Take 1 tablet (500 mg total) by mouth 3 (three) times daily. (Patient not taking: Reported on 05/11/2014)

## 2014-05-12 ENCOUNTER — Encounter: Payer: Self-pay | Admitting: Critical Care Medicine

## 2014-05-12 NOTE — Assessment & Plan Note (Addendum)
Long-standing history of chest pressure. Note evaluation in 2014 at the [facility] clinic including  Left heart catheterization which revealed no coronary disease.  The patient's symptom complex is more consistent with esophageal spasm and esophagitis with reflux. Note patient has had upper endoscopy previously revealing Barrett's esophagitis.  The patient's dyspnea is extremely atypical.  note d-dimer was normal at this visit  Plan Overnight sleep oxygen test will be obtained Increase omeprazole to twice daily 1/2 hour before meal Reflux diet Referral to GI  Full pulmonary function study

## 2014-05-12 NOTE — Assessment & Plan Note (Signed)
History of significant acid reflux disease with chest pain syndrome  Plan  Referral to gastroenterology  Increase omeprazole to 40 mg twice daily before meals  A reflux diet was issued to the patient

## 2014-05-12 NOTE — Progress Notes (Signed)
Quick Note:  Called, spoke with pt. Discussed lab results and recs per Dr. Joya Gaskins. Pt verbalized understanding and voiced no further questions or concerns at this time. ______

## 2014-05-16 ENCOUNTER — Ambulatory Visit: Payer: MEDICARE | Admitting: Physician Assistant

## 2014-05-19 ENCOUNTER — Encounter: Payer: Self-pay | Admitting: Physician Assistant

## 2014-05-19 ENCOUNTER — Telehealth: Payer: Self-pay | Admitting: *Deleted

## 2014-05-19 ENCOUNTER — Ambulatory Visit (INDEPENDENT_AMBULATORY_CARE_PROVIDER_SITE_OTHER): Payer: MEDICARE | Admitting: Physician Assistant

## 2014-05-19 VITALS — BP 125/81 | HR 74 | Temp 97.4°F | Wt 188.2 lb

## 2014-05-19 DIAGNOSIS — M5416 Radiculopathy, lumbar region: Secondary | ICD-10-CM | POA: Insufficient documentation

## 2014-05-19 MED ORDER — SIMVASTATIN 40 MG PO TABS: 40.0000 mg | ORAL_TABLET | Freq: Every evening | ORAL | Status: DC

## 2014-05-19 MED ORDER — LORAZEPAM 0.5 MG PO TABS: 0.5000 mg | ORAL_TABLET | Freq: Every day | ORAL | Status: DC

## 2014-05-19 MED ORDER — GABAPENTIN 100 MG PO CAPS: 100.0000 mg | ORAL_CAPSULE | Freq: Two times a day (BID) | ORAL | Status: DC

## 2014-05-19 MED ORDER — HYDROCHLOROTHIAZIDE 12.5 MG PO CAPS: 12.5000 mg | ORAL_CAPSULE | Freq: Every day | ORAL | Status: DC

## 2014-05-19 MED ORDER — OMEPRAZOLE 40 MG PO CPDR: 40.0000 mg | DELAYED_RELEASE_CAPSULE | Freq: Every day | ORAL | Status: DC

## 2014-05-19 NOTE — Patient Instructions (Signed)
Please take new medications (Gabapentin and Lorazepam) as directed.  Avoid heavy lifting or overexertion.  You will be called to for an appointment to be evaluated by Neurosurgery.  If symptoms acutely worsen or if you develop numbness in your groin, please go directly to the ER.

## 2014-05-19 NOTE — Progress Notes (Signed)
Patient presents to clinic today c/o exacerbation of his chronic low back pain x 1 week.  Endorses pain is radiating into lower extremities bilaterally.  Has MRI revealing herniated discs.  Refuses treatments recommended by his Neurosurgeon.  Was dismissed from PT due to cancelling multiple appointments. HAs been treated with steroids and pain medications previously but could not tolerate due to severe side effects.  Symptoms have been worsening but patient has been very noncompliant in following up with his specialist.  Is willing to be referred to Neurosurgery at present because he states "he can't take this pain anymore".  Past Medical History  Diagnosis Date  . Chicken pox   . GERD (gastroesophageal reflux disease)   . Measles   . [place] measles   . Mumps   . Hyperlipidemia   . Edema     Left Leg  . Essential hypertension, benign   . Blind right eye   . Heart attack   . Chronic back pain   . Dislocation, jaw 1960s  . Personal history of other diseases of digestive system 08.10.12    Gastric ulcer  . 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 Osteopenia 11.12.10    bone density test  . 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 Atypical chest pain      Negative cardiac catheterization 2014    Current Outpatient Prescriptions on File Prior to Visit  Medication Sig Dispense Refill  . aspirin (ASPIR-81) 81 MG EC tablet Take 1 tablet by mouth daily.    Marland Kitchen b complex vitamins tablet Take 1 tablet by mouth daily.    . Calcium Carbonate-Vitamin D (CALCIUM-VITAMIN D3 PO) Take by mouth daily.    . timolol (TIMOPTIC) 0.5 % ophthalmic solution Place 1 drop into the left eye daily. 10 mL 12   No current facility-administered medications on file prior to visit.    No Known Allergies  Family History  Problem Relation Age of Onset  . Alzheimer's disease Mother     Deceased  . Diabetes Maternal Grandmother   . Heart attack Maternal Grandmother   . Prostate cancer Maternal  Uncle   . Diabetes Maternal Uncle   . Healthy Son   . Cataracts Maternal Aunt     History   Social History  . Marital Status: Single    Spouse Name: N/A    Number of Children: N/A  . Years of Education: N/A   Social History Main Topics  . Smoking status: Former Smoker -- 0.25 packs/day for 2 years    Types: Cigarettes    Quit date: 06/02/1966  . Smokeless tobacco: Never Used  . Alcohol Use: No  . Drug Use: No  . Sexual Activity: None   Other Topics Concern  . None   Social History Narrative   Review of Systems - See HPI.  All other ROS are negative.  BP 125/81 mmHg  Pulse 74  Temp(Src) 97.4 F (36.3 C) (Oral)  Wt 188 lb 3.2 oz (85.367 kg)  SpO2 95%  Physical Exam  Constitutional: He is oriented to person, place, and time and well-developed, well-nourished, and in no distress.  HENT:  Head: Normocephalic and atraumatic.  Cardiovascular: Normal rate, regular rhythm, normal heart sounds and intact distal pulses.   Pulmonary/Chest: Effort normal and breath sounds normal. No respiratory distress. He has no wheezes. He has no rales. He exhibits no tenderness.  Musculoskeletal:       Lumbar back: He exhibits pain and spasm. He exhibits no tenderness  and no bony tenderness.  Neurological: He is alert and oriented to person, place, and time.  Skin: Skin is warm and dry. No rash noted.  Psychiatric: Affect normal.  Vitals reviewed.  Recent Results (from the past 2160 hour(s))  D-Dimer, Quantitative     Status: None   Collection Time: 05/11/14  9:36 AM  Result Value Ref Range   D-Dimer, Quant 0.46 0.00 - 0.48 ug/mL-FEU    Comment: At the inhouse established cutoff value of 0.48 ug/mL FEU, this methology has been documented in the literature to have a sensitivity and negative predictive value of at least 98-99%.  The test result should be correlated with an assessment of the clinical probability of DVT/VTE.     Assessment/Plan: Lumbar radiculopathy, chronic Start  Gabapentin as directed.  Will use Lorazepam at bedtime as muscle relaxant.  Referral placed to local Neurosurgery as patient is finally amenable. Alarm signs/symptoms discussed with patient. Follow-up in 1 month.

## 2014-05-19 NOTE — Telephone Encounter (Addendum)
Spoke with [place] with pt insurance, initiated PA for Omeprazole 40mg  BID This medication is being sent to [facility] and a letter of determination will be faxed  Patient ID# [id number] Ref # [id number] Will send to [facility] to follow up on.   Total Time: 20mins

## 2014-05-19 NOTE — Progress Notes (Signed)
Pre visit review using our clinic review tool, if applicable. No additional management support is needed unless otherwise documented below in the visit note. 

## 2014-05-19 NOTE — Assessment & Plan Note (Signed)
Start Gabapentin as directed.  Will use Lorazepam at bedtime as muscle relaxant.  Referral placed to local Neurosurgery as patient is finally amenable. Alarm signs/symptoms discussed with patient. Follow-up in 1 month.

## 2014-05-22 ENCOUNTER — Telehealth: Payer: Self-pay | Admitting: Physician Assistant

## 2014-05-22 MED ORDER — OMEPRAZOLE 40 MG PO CPDR: 40.0000 mg | DELAYED_RELEASE_CAPSULE | Freq: Every day | ORAL | Status: DC

## 2014-05-22 NOTE — Telephone Encounter (Signed)
Pt requesting omeprazole (PRILOSEC) 40 MG capsule please send to [facility] due to the fact he gets the medication free

## 2014-05-22 NOTE — Telephone Encounter (Signed)
Rx request to pharmacy/SLS  

## 2014-05-22 NOTE — Telephone Encounter (Signed)
Letter received from [employer]---PA for the omeprazole has been approved.  This has been given to crystal to place in the PW scan folder.  Nothing further is needed.

## 2014-05-23 ENCOUNTER — Telehealth: Payer: Self-pay | Admitting: Physician Assistant

## 2014-05-23 DIAGNOSIS — M5416 Radiculopathy, lumbar region: Secondary | ICD-10-CM

## 2014-05-23 NOTE — Telephone Encounter (Signed)
Done

## 2014-05-23 NOTE — Telephone Encounter (Signed)
Pt is requesting referral to neurology/request [facility] dr

## 2014-05-31 ENCOUNTER — Telehealth: Payer: Self-pay | Admitting: Critical Care Medicine

## 2014-05-31 DIAGNOSIS — R0789 Other chest pain: Secondary | ICD-10-CM

## 2014-05-31 NOTE — Telephone Encounter (Signed)
Let pt know ONO on RA normal

## 2014-05-31 NOTE — Telephone Encounter (Signed)
Called, spoke with pt.  Discussed below results per Dr. Joya Gaskins.  He verbalized understanding and voiced no further questions or concerns at this time.

## 2014-06-16 ENCOUNTER — Ambulatory Visit (INDEPENDENT_AMBULATORY_CARE_PROVIDER_SITE_OTHER): Payer: MEDICARE | Admitting: Physician Assistant

## 2014-06-16 ENCOUNTER — Encounter: Payer: Self-pay | Admitting: Physician Assistant

## 2014-06-16 VITALS — BP 140/71 | HR 56 | Temp 98.1°F | Resp 16 | Ht 65.25 in | Wt 192.4 lb

## 2014-06-16 DIAGNOSIS — R413 Other amnesia: Secondary | ICD-10-CM | POA: Diagnosis not present

## 2014-06-16 DIAGNOSIS — Z0000 Encounter for general adult medical examination without abnormal findings: Secondary | ICD-10-CM

## 2014-06-16 DIAGNOSIS — H9193 Unspecified hearing loss, bilateral: Secondary | ICD-10-CM

## 2014-06-16 DIAGNOSIS — Z23 Encounter for immunization: Secondary | ICD-10-CM | POA: Diagnosis not present

## 2014-06-16 DIAGNOSIS — R739 Hyperglycemia, unspecified: Secondary | ICD-10-CM

## 2014-06-16 DIAGNOSIS — Z136 Encounter for screening for cardiovascular disorders: Secondary | ICD-10-CM | POA: Diagnosis not present

## 2014-06-16 DIAGNOSIS — Z125 Encounter for screening for malignant neoplasm of prostate: Secondary | ICD-10-CM

## 2014-06-16 LAB — URINALYSIS, ROUTINE W REFLEX MICROSCOPIC
Bilirubin Urine: NEGATIVE
HGB URINE DIPSTICK: NEGATIVE
Ketones, ur: NEGATIVE
Leukocytes, UA: NEGATIVE
Nitrite: NEGATIVE
Specific Gravity, Urine: 1.01 (ref 1.000–1.030)
TOTAL PROTEIN, URINE-UPE24: NEGATIVE
Urine Glucose: NEGATIVE
Urobilinogen, UA: 0.2 (ref 0.0–1.0)
pH: 6.5 (ref 5.0–8.0)

## 2014-06-16 LAB — CBC WITH DIFFERENTIAL/PLATELET
Basophils Absolute: 0 10*3/uL (ref 0.0–0.1)
Basophils Relative: 0.6 % (ref 0.0–3.0)
Eosinophils Absolute: 0.2 10*3/uL (ref 0.0–0.7)
Eosinophils Relative: 3.7 % (ref 0.0–5.0)
HCT: 41.9 % (ref 39.0–52.0)
HEMOGLOBIN: 13.5 g/dL (ref 13.0–17.0)
Lymphocytes Relative: 43.9 % (ref 12.0–46.0)
Lymphs Abs: 1.8 10*3/uL (ref 0.7–4.0)
MCHC: 32.2 g/dL (ref 30.0–36.0)
MCV: 85.3 fl (ref 78.0–100.0)
MONOS PCT: 14.2 % — AB (ref 3.0–12.0)
Monocytes Absolute: 0.6 10*3/uL (ref 0.1–1.0)
Neutro Abs: 1.5 10*3/uL (ref 1.4–7.7)
Neutrophils Relative %: 37.6 % — ABNORMAL LOW (ref 43.0–77.0)
Platelets: 307 10*3/uL (ref 150.0–400.0)
RBC: 4.92 Mil/uL (ref 4.22–5.81)
RDW: 13.2 % (ref 11.5–15.5)
WBC: 4.1 10*3/uL (ref 4.0–10.5)

## 2014-06-16 LAB — COMPREHENSIVE METABOLIC PANEL
ALT: 16 U/L (ref 0–53)
AST: 17 U/L (ref 0–37)
Albumin: 3.8 g/dL (ref 3.5–5.2)
Alkaline Phosphatase: 68 U/L (ref 39–117)
BUN: 10 mg/dL (ref 6–23)
CO2: 30 mEq/L (ref 19–32)
Calcium: 9.6 mg/dL (ref 8.4–10.5)
Chloride: 102 mEq/L (ref 96–112)
Creatinine, Ser: 1.08 mg/dL (ref 0.40–1.50)
GFR: 86.79 mL/min (ref >60.00)
Glucose, Bld: 105 mg/dL — ABNORMAL HIGH (ref 70–99)
Potassium: 4.4 mEq/L (ref 3.5–5.1)
Sodium: 138 mEq/L (ref 135–145)
Total Bilirubin: 0.8 mg/dL (ref 0.2–1.2)
Total Protein: 6.9 g/dL (ref 6.0–8.3)

## 2014-06-16 LAB — PSA: PSA: 0.52 ng/mL (ref 0.10–4.00)

## 2014-06-16 LAB — LIPID PANEL
CHOL/HDL RATIO: 4
Cholesterol: 208 mg/dL — ABNORMAL HIGH (ref 0–200)
HDL: 49.7 mg/dL (ref >39.00)
LDL CALC: 120 mg/dL — AB (ref 0–99)
NonHDL: 158.3
Triglycerides: 192 mg/dL — ABNORMAL HIGH (ref 0.0–149.0)
VLDL: 38.4 mg/dL (ref 0.0–40.0)

## 2014-06-16 LAB — TSH: TSH: 1.52 u[IU]/mL (ref 0.35–4.50)

## 2014-06-16 LAB — HEMOGLOBIN A1C: Hgb A1c MFr Bld: 5.9 % (ref 4.6–6.5)

## 2014-06-16 LAB — VITAMIN B12: VITAMIN B 12: 412 pg/mL (ref 211–911)

## 2014-06-16 NOTE — Progress Notes (Signed)
Subjective:    Brian [name] is a 69 y.o. male who presents for Medicare Annual/Subsequent preventive examination.   Preventive Screening-Counseling & Management  Tobacco History  Smoking status  . Former Smoker -- 0.25 packs/day for 2 years  . Types: Cigarettes  . Quit date: 06/02/1966  Smokeless tobacco  . Never Used    Current Problems (verified) Patient Active Problem List   Diagnosis Date Noted  . Lumbar radiculopathy, chronic 05/19/2014  . Midline thoracic back pain 03/24/2014  . BPPV (benign paroxysmal positional vertigo) 01/20/2014  . Right-sided low back pain with right-sided sciatica 12/21/2013  . Lightheadedness 09/22/2013  . Porokeratosis 02/25/2013  . Pain in joint, ankle and foot 02/25/2013  . Lumbosacral pain 02/15/2013  . Abdominal pain, epigastric 02/15/2013  . Acid reflux 02/15/2013  . Hearing loss 02/15/2013  . Preventive measure 02/15/2013  . Chest pain 07/19/2012  . Bradycardia, sinus 07/19/2012  . Borderline glaucoma 03/15/2012  . 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 Dislocated inferior maxilla 01/10/2011  . H/O gastric ulcer 01/10/2011  . Back pain, chronic 11/21/2010    Medications Prior to Visit Current Outpatient Prescriptions on File Prior to Visit  Medication Sig Dispense Refill  . aspirin (ASPIR-81) 81 MG EC tablet Take 1 tablet by mouth daily.    Marland Kitchen b complex vitamins tablet Take 1 tablet by mouth daily.    . Calcium Carbonate-Vitamin D (CALCIUM-VITAMIN D3 PO) Take by mouth daily.    Marland Kitchen gabapentin (NEURONTIN) 100 MG capsule Take 1 capsule (100 mg total) by mouth 2 (two) times daily. 60 capsule 1  . hydrochlorothiazide (MICROZIDE) 12.5 MG capsule Take 1 capsule (12.5 mg total) by mouth daily. 90 capsule 0  . LORazepam (ATIVAN) 0.5 MG tablet Take 1 tablet (0.5 mg total) by mouth at bedtime. 30 tablet 0  . omeprazole (PRILOSEC) 40 MG capsule Take 1 capsule (40 mg total) by mouth daily. 90 capsule 0  .  simvastatin (ZOCOR) 40 MG tablet Take 1 tablet (40 mg total) by mouth every evening. 90 tablet 0  . timolol (TIMOPTIC) 0.5 % ophthalmic solution Place 1 drop into the left eye daily. 10 mL 12   No current facility-administered medications on file prior to visit.    Current Medications (verified) Current Outpatient Prescriptions  Medication Sig Dispense Refill  . aspirin (ASPIR-81) 81 MG EC tablet Take 1 tablet by mouth daily.    Marland Kitchen b complex vitamins tablet Take 1 tablet by mouth daily.    . Calcium Carbonate-Vitamin D (CALCIUM-VITAMIN D3 PO) Take by mouth daily.    Marland Kitchen gabapentin (NEURONTIN) 100 MG capsule Take 1 capsule (100 mg total) by mouth 2 (two) times daily. 60 capsule 1  . hydrochlorothiazide (MICROZIDE) 12.5 MG capsule Take 1 capsule (12.5 mg total) by mouth daily. 90 capsule 0  . LORazepam (ATIVAN) 0.5 MG tablet Take 1 tablet (0.5 mg total) by mouth at bedtime. 30 tablet 0  . omeprazole (PRILOSEC) 40 MG capsule Take 1 capsule (40 mg total) by mouth daily. 90 capsule 0  . simvastatin (ZOCOR) 40 MG tablet Take 1 tablet (40 mg total) by mouth every evening. 90 tablet 0  . timolol (TIMOPTIC) 0.5 % ophthalmic solution Place 1 drop into the left eye daily. 10 mL 12   No current facility-administered medications for this visit.     Allergies (verified) Review of patient's allergies indicates no known allergies.   PAST HISTORY  Family History Family History  Problem Relation Age of Onset  .  Alzheimer's disease Mother     Deceased  . Diabetes Maternal Grandmother   . Heart attack Maternal Grandmother   . Prostate cancer Maternal Uncle   . Diabetes Maternal Uncle   . Healthy Son   . Cataracts Maternal Aunt     Social History History  Substance Use Topics  . Smoking status: Former Smoker -- 0.25 packs/day for 2 years    Types: Cigarettes    Quit date: 06/02/1966  . Smokeless tobacco: Never Used  . Alcohol Use: No    Are there smokers in your home (other than you)?   No  Risk Factors Current exercise habits: Home exercise routine includes walking 1 hrs per day.  Dietary issues discussed: Body mass index is 31.78 kg/(m^2).  Is eating regularly, but small portions.    Cardiac risk factors: advanced age (older than 54 for men, 75 for women), dyslipidemia, family history of premature cardiovascular disease, hypertension and male gender.  Depression Screen (Note: if answer to either of the following is "Yes", a more complete depression screening is indicated)   Q1: Over the past two weeks, have you felt down, depressed or hopeless? No  Q2: Over the past two weeks, have you felt little interest or pleasure in doing things? No  Have you lost interest or pleasure in daily life? No  Do you often feel hopeless? No  Do you cry easily over simple problems? No  Activities of Daily Living In your present state of health, do you have any difficulty performing the following activities?:  Driving? N/A  --Does not have license. Managing money?  No Feeding yourself? No Getting from bed to chair? No Climbing a flight of stairs? No Preparing food and eating?: No Bathing or showering? No Getting dressed: No Getting to the toilet? No Using the toilet:No Moving around from place to place: No In the past year have you fallen or had a near fall?:No   Are you sexually active?  No  Do you have more than one partner?  N/A  Hearing Difficulties: Yes Do you often ask people to speak up or repeat themselves? Yes Do you experience ringing or noises in your ears? No Do you have difficulty understanding soft or whispered voices? Yes   Do you feel that you have a problem with memory? Yes  Do you often misplace items? Yes  Do you feel safe at home?  Yes  Cognitive Testing  Alert? Yes  Normal Appearance?Yes  Oriented to person? Yes  Place? Yes   Time? Yes  Recall of three objects?  Yes  Can perform simple calculations? Yes  Displays appropriate judgment?Yes  Can  read the correct time from a watch face?Yes   Advanced Directives have been discussed with the patient? Yes   List the Names of Other Physician/Practitioners you currently use: 1.    Indicate any recent Medical Services you may have received from other than [facility] providers in the past year (date may be approximate).  Immunization History  Administered Date(s) Administered  . Influenza,inj,Quad PF,36+ Mos 02/15/2013  . Influenza-Unspecified 04/26/2012  . Pneumococcal Polysaccharide-23 11/21/2010  . Tdap 11/21/2010  . Zoster 02/15/2013    Screening Tests Health Maintenance  Topic Date Due  . COLONOSCOPY  11/26/1993  . Pneumococcal Low Risk Adult (2 of 2 - PCV13) 11/21/2011  . INFLUENZA VACCINE  12/31/2013  . TETANUS/TDAP  11/20/2020  . PNEUMOCOCCAL POLYSACCHARIDE VACCINE AGE 1 AND OVER  Completed  . ZOSTAVAX  Completed    All answers  were reviewed with the patient and necessary referrals were made:  Leeanne Rio, PA-C   06/16/2014   History reviewed: allergies, current medications, past family history, past medical history, past social history, past surgical history and problem list  Review of Systems A comprehensive review of systems was negative except for: Neurological: positive for memory problems   Objective:     Vision by Snellen chart: right XUX:YBFXOVA declines measurement, left NVB:TYOMAYO declines measurement Blood pressure 140/71, pulse 56, temperature 98.1 F (36.7 C), temperature source Oral, resp. rate 16, height 5' 5.25" (1.657 m), weight 192 lb 6 oz (87.261 kg), SpO2 100 %. Body mass index is 31.78 kg/(m^2).  General appearance: alert, cooperative and no distress Head: Normocephalic, without obvious abnormality, atraumatic Eyes: conjunctivae/corneas clear. PERRL, EOM's intact. Fundi benign. Ears: normal TM's and external ear canals both ears Nose: Nares normal. Septum midline. Mucosa normal. No drainage or sinus tenderness. Throat: lips,  mucosa, and tongue normal; teeth and gums normal Lungs: clear to auscultation bilaterally Heart: regular rate and rhythm, S1, S2 normal, no murmur, click, rub or gallop Skin: Skin color, texture, turgor normal. No rashes or lesions Lymph nodes: Cervical, supraclavicular, and axillary nodes normal.     Assessment:     (1) Medicare Wellness (2) Hearing Loss (3) Memory Loss     Plan:     (1) During the course of the visit the patient was educated and counseled about appropriate screening and preventive services including:    Pneumococcal vaccine   Influenza vaccine  Td vaccine  Screening electrocardiogram  Prostate cancer screening  Colorectal cancer screening  Diabetes screening  Patient Instructions (the written plan) was given to the patient.  Medicare Attestation I have personally reviewed: The patient's medical and social history Their use of alcohol, tobacco or illicit drugs Their current medications and supplements The patient's functional ability including ADLs,fall risks, home safety risks, cognitive, and hearing and visual impairment Diet and physical activities Evidence for depression or mood disorders  The patient's weight, height, BMI, and visual acuity have been recorded in the chart.  I have made referrals, counseling, and provided education to the patient based on review of the above and I have provided the patient with a written personalized care plan for preventive services.    (2) Referral to Audiology placed for formal evaluation.  (3) Will obtain lab panel.  Neuro exam within normal limits. Symptoms seem very mild and related to senescence.   Raiford Noble [place], [place]   06/16/2014

## 2014-06-16 NOTE — Progress Notes (Signed)
Pre visit review using our clinic review tool, if applicable. No additional management support is needed unless otherwise documented below in the visit note/SLS  

## 2014-06-16 NOTE — Patient Instructions (Signed)
Please stop by the lab for blood work.  I will call you with your results.  We will treat accordingly.  Continue chronic medications as directed.  Refills have been sent to pharmacy.  Also a prescription for the Clobetasol ointment has been sent.  Apply as directed to hands.  Follow-up will be based on results.   Preventive Care for Adults A healthy lifestyle and preventive care can promote health and wellness. Preventive health guidelines for men include the following key practices:  A routine yearly physical is a good way to check with your health care provider about your health and preventative screening. It is a chance to share any concerns and updates on your health and to receive a thorough exam.  Visit your dentist for a routine exam and preventative care every 6 months. Brush your teeth twice a day and floss once a day. Good oral hygiene prevents tooth decay and gum disease.  The frequency of eye exams is based on your age, health, family medical history, use of contact lenses, and other factors. Follow your health care provider's recommendations for frequency of eye exams.  Eat a healthy diet. Foods such as vegetables, fruits, whole grains, low-fat dairy products, and lean protein foods contain the nutrients you need without too many calories. Decrease your intake of foods high in solid fats, added sugars, and salt. Eat the right amount of calories for you.Get information about a proper diet from your health care provider, if necessary.  Regular physical exercise is one of the most important things you can do for your health. Most adults should get at least 150 minutes of moderate-intensity exercise (any activity that increases your heart rate and causes you to sweat) each week. In addition, most adults need muscle-strengthening exercises on 2 or more days a week.  Maintain a healthy weight. The body mass index (BMI) is a screening tool to identify possible weight problems. It provides  an estimate of body fat based on height and weight. Your health care provider can find your BMI and can help you achieve or maintain a healthy weight.For adults 20 years and older:  A BMI below 18.5 is considered underweight.  A BMI of 18.5 to 24.9 is normal.  A BMI of 25 to 29.9 is considered overweight.  A BMI of 30 and above is considered obese.  Maintain normal blood lipids and cholesterol levels by exercising and minimizing your intake of saturated fat. Eat a balanced diet with plenty of fruit and vegetables. Blood tests for lipids and cholesterol should begin at age 83 and be repeated every 5 years. If your lipid or cholesterol levels are high, you are over 50, or you are at high risk for heart disease, you may need your cholesterol levels checked more frequently.Ongoing high lipid and cholesterol levels should be treated with medicines if diet and exercise are not working.  If you smoke, find out from your health care provider how to quit. If you do not use tobacco, do not start.  Lung cancer screening is recommended for adults aged 21-80 years who are at high risk for developing lung cancer because of a history of smoking. A yearly low-dose CT scan of the lungs is recommended for people who have at least a 30-pack-year history of smoking and are a current smoker or have quit within the past 15 years. A pack year of smoking is smoking an average of 1 pack of cigarettes a day for 1 year (for example: 1 pack a  day for 30 years or 2 packs a day for 15 years). Yearly screening should continue until the smoker has stopped smoking for at least 15 years. Yearly screening should be stopped for people who develop a health problem that would prevent them from having lung cancer treatment.  If you choose to drink alcohol, do not have more than 2 drinks per day. One drink is considered to be 12 ounces (355 mL) of beer, 5 ounces (148 mL) of wine, or 1.5 ounces (44 mL) of liquor.  Avoid use of street  drugs. Do not share needles with anyone. Ask for help if you need support or instructions about stopping the use of drugs.  High blood pressure causes heart disease and increases the risk of stroke. Your blood pressure should be checked at least every 1-2 years. Ongoing high blood pressure should be treated with medicines, if weight loss and exercise are not effective.  If you are 15-41 years old, ask your health care provider if you should take aspirin to prevent heart disease.  Diabetes screening involves taking a blood sample to check your fasting blood sugar level. This should be done once every 3 years, after age 31, if you are within normal weight and without risk factors for diabetes. Testing should be considered at a younger age or be carried out more frequently if you are overweight and have at least 1 risk factor for diabetes.  Colorectal cancer can be detected and often prevented. Most routine colorectal cancer screening begins at the age of 37 and continues through age 82. However, your health care provider may recommend screening at an earlier age if you have risk factors for colon cancer. On a yearly basis, your health care provider may provide home test kits to check for hidden blood in the stool. Use of a small camera at the end of a tube to directly examine the colon (sigmoidoscopy or colonoscopy) can detect the earliest forms of colorectal cancer. Talk to your health care provider about this at age 50, when routine screening begins. Direct exam of the colon should be repeated every 5-10 years through age 76, unless early forms of precancerous polyps or small growths are found.  People who are at an increased risk for hepatitis B should be screened for this virus. You are considered at high risk for hepatitis B if:  You were born in a country where hepatitis B occurs often. Talk with your health care provider about which countries are considered high risk.  Your parents were born in a  high-risk country and you have not received a shot to protect against hepatitis B (hepatitis B vaccine).  You have HIV or AIDS.  You use needles to inject street drugs.  You live with, or have sex with, someone who has hepatitis B.  You are a man who has sex with other men (MSM).  You get hemodialysis treatment.  You take certain medicines for conditions such as cancer, organ transplantation, and autoimmune conditions.  Hepatitis C blood testing is recommended for all people born from [birth year] through 1965 and any individual with known risks for hepatitis C.  Practice safe sex. Use condoms and avoid high-risk sexual practices to reduce the spread of sexually transmitted infections (STIs). STIs include gonorrhea, chlamydia, syphilis, trichomonas, herpes, HPV, and human immunodeficiency virus (HIV). Herpes, HIV, and HPV are viral illnesses that have no cure. They can result in disability, cancer, and death.  If you are at risk of being infected with HIV,  it is recommended that you take a prescription medicine daily to prevent HIV infection. This is called preexposure prophylaxis (PrEP). You are considered at risk if:  You are a man who has sex with other men (MSM) and have other risk factors.  You are a heterosexual man, are sexually active, and are at increased risk for HIV infection.  You take drugs by injection.  You are sexually active with a partner who has HIV.  Talk with your health care provider about whether you are at high risk of being infected with HIV. If you choose to begin PrEP, you should first be tested for HIV. You should then be tested every 3 months for as long as you are taking PrEP.  A one-time screening for abdominal aortic aneurysm (AAA) and surgical repair of large AAAs by ultrasound are recommended for men ages 2 to 41 years who are current or former smokers.  Healthy men should no longer receive prostate-specific antigen (PSA) blood tests as part of routine  cancer screening. Talk with your health care provider about prostate cancer screening.  Testicular cancer screening is not recommended for adult males who have no symptoms. Screening includes self-exam, a health care provider exam, and other screening tests. Consult with your health care provider about any symptoms you have or any concerns you have about testicular cancer.  Use sunscreen. Apply sunscreen liberally and repeatedly throughout the day. You should seek shade when your shadow is shorter than you. Protect yourself by wearing long sleeves, pants, a wide-brimmed hat, and sunglasses year round, whenever you are outdoors.  Once a month, do a whole-body skin exam, using a mirror to look at the skin on your back. Tell your health care provider about new moles, moles that have irregular borders, moles that are larger than a pencil eraser, or moles that have changed in shape or color.  Stay current with required vaccines (immunizations).  Influenza vaccine. All adults should be immunized every year.  Tetanus, diphtheria, and acellular pertussis (Td, Tdap) vaccine. An adult who has not previously received Tdap or who does not know his vaccine status should receive 1 dose of Tdap. This initial dose should be followed by tetanus and diphtheria toxoids (Td) booster doses every 10 years. Adults with an unknown or incomplete history of completing a 3-dose immunization series with Td-containing vaccines should begin or complete a primary immunization series including a Tdap dose. Adults should receive a Td booster every 10 years.  Varicella vaccine. An adult without evidence of immunity to varicella should receive 2 doses or a second dose if he has previously received 1 dose.  Human papillomavirus (HPV) vaccine. Males aged 67-21 years who have not received the vaccine previously should receive the 3-dose series. Males aged 22-26 years may be immunized. Immunization is recommended through the age of 18  years for any male who has sex with males and did not get any or all doses earlier. Immunization is recommended for any person with an immunocompromised condition through the age of 68 years if he did not get any or all doses earlier. During the 3-dose series, the second dose should be obtained 4-8 weeks after the first dose. The third dose should be obtained 24 weeks after the first dose and 16 weeks after the second dose.  Zoster vaccine. One dose is recommended for adults aged 48 years or older unless certain conditions are present.  Measles, mumps, and rubella (MMR) vaccine. Adults born before [birth year] generally are considered immune to  measles and mumps. Adults born in [birth year] or later should have 1 or more doses of MMR vaccine unless there is a contraindication to the vaccine or there is laboratory evidence of immunity to each of the three diseases. A routine second dose of MMR vaccine should be obtained at least 28 days after the first dose for students attending postsecondary schools, health care workers, or international travelers. People who received inactivated measles vaccine or an unknown type of measles vaccine during 1963-1967 should receive 2 doses of MMR vaccine. People who received inactivated mumps vaccine or an unknown type of mumps vaccine before 1979 and are at high risk for mumps infection should consider immunization with 2 doses of MMR vaccine. Unvaccinated health care workers born before [birth year] who lack laboratory evidence of measles, mumps, or rubella immunity or laboratory confirmation of disease should consider measles and mumps immunization with 2 doses of MMR vaccine or rubella immunization with 1 dose of MMR vaccine.  Pneumococcal 13-valent conjugate (PCV13) vaccine. When indicated, a person who is uncertain of his immunization history and has no record of immunization should receive the PCV13 vaccine. An adult aged 88 years or older who has certain medical conditions and has not been  previously immunized should receive 1 dose of PCV13 vaccine. This PCV13 should be followed with a dose of pneumococcal polysaccharide (PPSV23) vaccine. The PPSV23 vaccine dose should be obtained at least 8 weeks after the dose of PCV13 vaccine. An adult aged 105 years or older who has certain medical conditions and previously received 1 or more doses of PPSV23 vaccine should receive 1 dose of PCV13. The PCV13 vaccine dose should be obtained 1 or more years after the last PPSV23 vaccine dose.  Pneumococcal polysaccharide (PPSV23) vaccine. When PCV13 is also indicated, PCV13 should be obtained first. All adults aged 53 years and older should be immunized. An adult younger than age 44 years who has certain medical conditions should be immunized. Any person who resides in a nursing home or long-term care facility should be immunized. An adult smoker should be immunized. People with an immunocompromised condition and certain other conditions should receive both PCV13 and PPSV23 vaccines. People with human immunodeficiency virus (HIV) infection should be immunized as soon as possible after diagnosis. Immunization during chemotherapy or radiation therapy should be avoided. Routine use of PPSV23 vaccine is not recommended for American Indians, [place] Natives, or people younger than 65 years unless there are medical conditions that require PPSV23 vaccine. When indicated, people who have unknown immunization and have no record of immunization should receive PPSV23 vaccine. One-time revaccination 5 years after the first dose of PPSV23 is recommended for people aged 19-64 years who have chronic kidney failure, nephrotic syndrome, asplenia, or immunocompromised conditions. People who received 1-2 doses of PPSV23 before age 71 years should receive another dose of PPSV23 vaccine at age 19 years or later if at least 5 years have passed since the previous dose. Doses of PPSV23 are not needed for people immunized with PPSV23 at or  after age 32 years.  Meningococcal vaccine. Adults with asplenia or persistent complement component deficiencies should receive 2 doses of quadrivalent meningococcal conjugate (MenACWY-D) vaccine. The doses should be obtained at least 2 months apart. Microbiologists working with certain meningococcal bacteria, [employer] recruits, people at risk during an outbreak, and people who travel to or live in countries with a high rate of meningitis should be immunized. A first-year college student up through age 59 years who is living in a residence  hall should receive a dose if he did not receive a dose on or after his 16th birthday. Adults who have certain high-risk conditions should receive one or more doses of vaccine.  Hepatitis A vaccine. Adults who wish to be protected from this disease, have certain high-risk conditions, work with hepatitis A-infected animals, work in hepatitis A research labs, or travel to or work in countries with a high rate of hepatitis A should be immunized. Adults who were previously unvaccinated and who anticipate close contact with an international adoptee during the first 60 days after arrival in the [place] from a country with a high rate of hepatitis A should be immunized.  Hepatitis B vaccine. Adults should be immunized if they wish to be protected from this disease, have certain high-risk conditions, may be exposed to blood or other infectious body fluids, are household contacts or sex partners of hepatitis B positive people, are clients or workers in certain care facilities, or travel to or work in countries with a high rate of hepatitis B.  Haemophilus influenzae type b (Hib) vaccine. A previously unvaccinated person with asplenia or sickle cell disease or having a scheduled splenectomy should receive 1 dose of Hib vaccine. Regardless of previous immunization, a recipient of a hematopoietic stem cell transplant should receive a 3-dose series 6-12 months after his  successful transplant. Hib vaccine is not recommended for adults with HIV infection. Preventive Service / Frequency Ages 30 to 85  Blood pressure check.** / Every 1 to 2 years.  Lipid and cholesterol check.** / Every 5 years beginning at age 4.  Hepatitis C blood test.** / For any individual with known risks for hepatitis C.  Skin self-exam. / Monthly.  Influenza vaccine. / Every year.  Tetanus, diphtheria, and acellular pertussis (Tdap, Td) vaccine.** / Consult your health care provider. 1 dose of Td every 10 years.  Varicella vaccine.** / Consult your health care provider.  HPV vaccine. / 3 doses over 6 months, if 81 or younger.  Measles, mumps, rubella (MMR) vaccine.** / You need at least 1 dose of MMR if you were born in 1957 or later. You may also need a second dose.  Pneumococcal 13-valent conjugate (PCV13) vaccine.** / Consult your health care provider.  Pneumococcal polysaccharide (PPSV23) vaccine.** / 1 to 2 doses if you smoke cigarettes or if you have certain conditions.  Meningococcal vaccine.** / 1 dose if you are age 43 to 4 years and a Market researcher living in a residence hall, or have one of several medical conditions. You may also need additional booster doses.  Hepatitis A vaccine.** / Consult your health care provider.  Hepatitis B vaccine.** / Consult your health care provider.  Haemophilus influenzae type b (Hib) vaccine.** / Consult your health care provider. Ages 94 to 64  Blood pressure check.** / Every 1 to 2 years.  Lipid and cholesterol check.** / Every 5 years beginning at age 38.  Lung cancer screening. / Every year if you are aged 47-80 years and have a 30-pack-year history of smoking and currently smoke or have quit within the past 15 years. Yearly screening is stopped once you have quit smoking for at least 15 years or develop a health problem that would prevent you from having lung cancer treatment.  Fecal occult blood test (FOBT)  of stool. / Every year beginning at age 35 and continuing until age 10. You may not have to do this test if you get a colonoscopy every 10 years.  Flexible sigmoidoscopy** or colonoscopy.** / Every 5 years for a flexible sigmoidoscopy or every 10 years for a colonoscopy beginning at age 63 and continuing until age 80.  Hepatitis C blood test.** / For all people born from [birth year] through 1965 and any individual with known risks for hepatitis C.  Skin self-exam. / Monthly.  Influenza vaccine. / Every year.  Tetanus, diphtheria, and acellular pertussis (Tdap/Td) vaccine.** / Consult your health care provider. 1 dose of Td every 10 years.  Varicella vaccine.** / Consult your health care provider.  Zoster vaccine.** / 1 dose for adults aged 84 years or older.  Measles, mumps, rubella (MMR) vaccine.** / You need at least 1 dose of MMR if you were born in 1959 or later. You may also need a second dose.  Pneumococcal 13-valent conjugate (PCV13) vaccine.** / Consult your health care provider.  Pneumococcal polysaccharide (PPSV23) vaccine.** / 1 to 2 doses if you smoke cigarettes or if you have certain conditions.  Meningococcal vaccine.** / Consult your health care provider.  Hepatitis A vaccine.** / Consult your health care provider.  Hepatitis B vaccine.** / Consult your health care provider.  Haemophilus influenzae type b (Hib) vaccine.** / Consult your health care provider. Ages 48 and over  Blood pressure check.** / Every 1 to 2 years.  Lipid and cholesterol check.**/ Every 5 years beginning at age 32.  Lung cancer screening. / Every year if you are aged 85-80 years and have a 30-pack-year history of smoking and currently smoke or have quit within the past 15 years. Yearly screening is stopped once you have quit smoking for at least 15 years or develop a health problem that would prevent you from having lung cancer treatment.  Fecal occult blood test (FOBT) of stool. / Every year  beginning at age 75 and continuing until age 65. You may not have to do this test if you get a colonoscopy every 10 years.  Flexible sigmoidoscopy** or colonoscopy.** / Every 5 years for a flexible sigmoidoscopy or every 10 years for a colonoscopy beginning at age 9 and continuing until age 59.  Hepatitis C blood test.** / For all people born from [birth year] through 1967 and any individual with known risks for hepatitis C.  Abdominal aortic aneurysm (AAA) screening.** / A one-time screening for ages 44 to 24 years who are current or former smokers.  Skin self-exam. / Monthly.  Influenza vaccine. / Every year.  Tetanus, diphtheria, and acellular pertussis (Tdap/Td) vaccine.** / 1 dose of Td every 10 years.  Varicella vaccine.** / Consult your health care provider.  Zoster vaccine.** / 1 dose for adults aged 2 years or older.  Pneumococcal 13-valent conjugate (PCV13) vaccine.** / Consult your health care provider.  Pneumococcal polysaccharide (PPSV23) vaccine.** / 1 dose for all adults aged 17 years and older.  Meningococcal vaccine.** / Consult your health care provider.  Hepatitis A vaccine.** / Consult your health care provider.  Hepatitis B vaccine.** / Consult your health care provider.  Haemophilus influenzae type b (Hib) vaccine.** / Consult your health care provider. **Family history and personal history of risk and conditions may change your health care provider's recommendations. Document Released: 07/15/2001 Document Revised: 05/24/2013 Document Reviewed: 10/14/2010 [facility] Patient Information 2015 [place], [place]. This information is not intended to replace advice given to you by your health care provider. Make sure you discuss any questions you have with your health care provider.

## 2014-06-16 NOTE — Assessment & Plan Note (Signed)
EKG reveals sinus bradycardia.  Asymptomatic.  Will check fasting labs today.

## 2014-06-26 ENCOUNTER — Telehealth: Payer: Self-pay | Admitting: Physician Assistant

## 2014-06-26 NOTE — Telephone Encounter (Signed)
Medication Detail      Disp Refills Start End     omeprazole (PRILOSEC) 40 MG capsule 90 capsule 0 05/22/2014     Sig - Route: Take 1 capsule (40 mg total) by mouth daily. - Oral    E-Prescribing Status: Receipt confirmed by pharmacy (05/22/2014 2:11 PM EST)     Pharmacy    [place], [address]   Medication Detail      Disp Refills Start End     simvastatin (ZOCOR) 40 MG tablet 90 tablet 0 05/19/2014     Sig - Route: Take 1 tablet (40 mg total) by mouth every evening. - Oral    E-Prescribing Status: Receipt confirmed by pharmacy (05/19/2014 7:41 AM EST)     Pharmacy    [id number] - [place], [place] - [address] AT [place]   Medication Detail      Disp Refills Start End     timolol (TIMOPTIC) 0.5 % ophthalmic solution 10 mL 12 02/16/2013     Sig - Route: Place 1 drop into the left eye daily. - Left Eye    Class: No Print    Notes to Pharmacy: Timolol Maleate 0.5% [place], [place]

## 2014-06-26 NOTE — Telephone Encounter (Signed)
All [facility] not [facility].   Patient is also requesting Lab results

## 2014-06-26 NOTE — Telephone Encounter (Signed)
Caller name: Asani Relation to pt: self Call back number: [phone] Pharmacy: [place]  Reason for call:   Patient requesting a refill of simvastatin, omeprazole, and the ointment(does not know name)

## 2014-06-28 ENCOUNTER — Ambulatory Visit: Payer: MEDICARE | Admitting: Internal Medicine

## 2014-06-28 ENCOUNTER — Other Ambulatory Visit: Payer: Self-pay | Admitting: Physician Assistant

## 2014-06-28 DIAGNOSIS — L309 Dermatitis, unspecified: Secondary | ICD-10-CM

## 2014-06-28 MED ORDER — SIMVASTATIN 40 MG PO TABS: 40.0000 mg | ORAL_TABLET | Freq: Every evening | ORAL | Status: DC

## 2014-06-28 MED ORDER — HYDROCHLOROTHIAZIDE 12.5 MG PO CAPS: 12.5000 mg | ORAL_CAPSULE | Freq: Every day | ORAL | Status: DC

## 2014-06-28 MED ORDER — CLOBETASOL PROPIONATE 0.05 % EX OINT: 1.0000 "application " | TOPICAL_OINTMENT | Freq: Two times a day (BID) | CUTANEOUS | Status: DC

## 2014-06-28 NOTE — Telephone Encounter (Signed)
Spoke with PT informed that refills are at [facility]- PT requesting that those be cancelled and sent through [facility]. Also still requesting to speak with Ivin Booty regarding Lab Results

## 2014-06-28 NOTE — Telephone Encounter (Signed)
Medication Detail      Disp Refills Start End     hydrochlorothiazide (MICROZIDE) 12.5 MG capsule 90 capsule 0 05/19/2014     Sig - Route: Take 1 capsule (12.5 mg total) by mouth daily. - Oral    E-Prescribing Status: Receipt confirmed by pharmacy (05/19/2014 7:41 AM EST)     Spoke with patient; corrected Wrong information given by front office/administrative staff, all medications were NOT sent to [facility] and the ointment in question is Clobetasol cream for his hands [not mentioned in previous part of phone note] as provider is out of the office this week until Friday, 01.29.16 and this was Not relayed to the patient when administrative staff gave information to patient. All Rx have been taken care of to [facility]. Results have not been received as again provider is out of office this week until Friday, 01.29.16/SLS

## 2014-06-28 NOTE — Telephone Encounter (Signed)
Patient informed per VO provider, "Clobetasol sent to pharmacy. Apply twice daily as needed. His results were reviewed several days ago and sent to [facility] to call patient. To reiterate, all labs look great. Continue current medication regimen"; understood & agreed/SLS

## 2014-07-18 ENCOUNTER — Encounter: Payer: Self-pay | Admitting: Physician Assistant

## 2014-08-18 ENCOUNTER — Encounter: Payer: Self-pay | Admitting: Physician Assistant

## 2014-08-18 ENCOUNTER — Ambulatory Visit (INDEPENDENT_AMBULATORY_CARE_PROVIDER_SITE_OTHER): Payer: MEDICARE | Admitting: Physician Assistant

## 2014-08-18 VITALS — BP 117/72 | HR 73 | Temp 98.5°F | Resp 18 | Ht 65.25 in | Wt 181.0 lb

## 2014-08-18 DIAGNOSIS — R319 Hematuria, unspecified: Secondary | ICD-10-CM | POA: Diagnosis not present

## 2014-08-18 DIAGNOSIS — J00 Acute nasopharyngitis [common cold]: Secondary | ICD-10-CM

## 2014-08-18 DIAGNOSIS — B9689 Other specified bacterial agents as the cause of diseases classified elsewhere: Secondary | ICD-10-CM | POA: Insufficient documentation

## 2014-08-18 DIAGNOSIS — J208 Acute bronchitis due to other specified organisms: Secondary | ICD-10-CM

## 2014-08-18 LAB — POCT URINALYSIS DIPSTICK
Bilirubin, UA: NEGATIVE
Blood, UA: NEGATIVE
Glucose, UA: NEGATIVE
KETONES UA: NEGATIVE
Leukocytes, UA: NEGATIVE
Nitrite, UA: NEGATIVE
Protein, UA: NEGATIVE
SPEC GRAV UA: 1.01
Urobilinogen, UA: NEGATIVE
pH, UA: 6

## 2014-08-18 MED ORDER — BENZONATATE 100 MG PO CAPS: 100.0000 mg | ORAL_CAPSULE | Freq: Two times a day (BID) | ORAL | Status: DC | PRN

## 2014-08-18 MED ORDER — SULFAMETHOXAZOLE-TRIMETHOPRIM 800-160 MG PO TABS: 1.0000 | ORAL_TABLET | Freq: Two times a day (BID) | ORAL | Status: DC

## 2014-08-18 NOTE — Assessment & Plan Note (Signed)
Traumatic.  One episode.  Resolved.  Urine dip negative.  Exam within normal limits bilaterally. Supportive measures discussed.

## 2014-08-18 NOTE — Progress Notes (Signed)
Patient presents to clinic today with multiple complaints.  Patient c/o 2 weeks of worsening productive cough, fatigue, chest congestion.  Endorses left ear pressure and throbbing pain.  Denies fever, SOB, chest pain, sinus symptoms.  Denies recent travel.  Has not taken anything for symptoms other than cough drops.  Patient c/o bright red blood in urine x 2 days after trauma to the penis. Endorses having to urinate but worried he would not make it to the toilet.  States he squeezed the tip of his penis forcefully.  Noticed blood when urinating right after.  Noted some tenderness that has resolved.  Denies fever, chills, nausea or vomiting. Has not seen any recurrence of symptoms.  Past Medical History  Diagnosis Date  . Chicken pox   . GERD (gastroesophageal reflux disease)   . Measles   . [place] measles   . Mumps   . Hyperlipidemia   . Edema     Left Leg  . Essential hypertension, benign   . Blind right eye   . Heart attack   . Chronic back pain   . Dislocation, jaw 1960s  . Personal history of other diseases of digestive system 08.10.12    Gastric ulcer  . 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 Osteopenia 11.12.10    bone density test  . 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 Atypical chest pain      Negative cardiac catheterization 2014    Current Outpatient Prescriptions on File Prior to Visit  Medication Sig Dispense Refill  . aspirin (ASPIR-81) 81 MG EC tablet Take 1 tablet by mouth daily.    Marland Kitchen b complex vitamins tablet Take 1 tablet by mouth daily.    . Calcium Carbonate-Vitamin D (CALCIUM-VITAMIN D3 PO) Take by mouth daily.    . clobetasol ointment (TEMOVATE) 2.77 % Apply 1 application topically 2 (two) times daily. 30 g 0  . gabapentin (NEURONTIN) 100 MG capsule Take 1 capsule (100 mg total) by mouth 2 (two) times daily. 60 capsule 1  . hydrochlorothiazide (MICROZIDE) 12.5 MG capsule Take 1 capsule (12.5 mg total) by mouth daily. 90 capsule 1  .  LORazepam (ATIVAN) 0.5 MG tablet Take 1 tablet (0.5 mg total) by mouth at bedtime. 30 tablet 0  . omeprazole (PRILOSEC) 40 MG capsule Take 1 capsule (40 mg total) by mouth daily. 90 capsule 0  . simvastatin (ZOCOR) 40 MG tablet Take 1 tablet (40 mg total) by mouth every evening. 90 tablet 1  . timolol (TIMOPTIC) 0.5 % ophthalmic solution Place 1 drop into the left eye daily. 10 mL 12   No current facility-administered medications on file prior to visit.    No Known Allergies  Family History  Problem Relation Age of Onset  . Alzheimer's disease Mother     Deceased  . Diabetes Maternal Grandmother   . Heart attack Maternal Grandmother   . Prostate cancer Maternal Uncle   . Diabetes Maternal Uncle   . Healthy Son   . Cataracts Maternal Aunt     History   Social History  . Marital Status: Single    Spouse Name: N/A  . Number of Children: N/A  . Years of Education: N/A   Social History Main Topics  . Smoking status: Former Smoker -- 0.25 packs/day for 2 years    Types: Cigarettes    Quit date: 06/02/1966  . Smokeless tobacco: Never Used  . Alcohol Use: No  . Drug Use: No  . Sexual Activity: Not on  file   Other Topics Concern  . None   Social History Narrative    Review of Systems - See HPI.  All other ROS are negative.  BP 117/72 mmHg  Pulse 73  Temp(Src) 98.5 F (36.9 C) (Oral)  Resp 18  Ht 5' 5.25" (1.657 m)  Wt 181 lb (82.101 kg)  BMI 29.90 kg/m2  SpO2 97%  Physical Exam  Constitutional: He is oriented to person, place, and time and well-developed, well-nourished, and in no distress.  HENT:  Head: Normocephalic and atraumatic.  Right Ear: External ear normal.  Left Ear: External ear normal.  Nose: Nose normal.  Mouth/Throat: Oropharynx is clear and moist. No oropharyngeal exudate.  TM within normal limits bilaterally.  Eyes: Conjunctivae are normal. Pupils are equal, round, and reactive to light.  Neck: Neck supple.  Cardiovascular: Normal rate,  regular rhythm, normal heart sounds and intact distal pulses.   Pulmonary/Chest: Effort normal and breath sounds normal. No respiratory distress. He has no wheezes. He has no rales. He exhibits no tenderness.  Genitourinary: Penis normal.  Lymphadenopathy:    He has no cervical adenopathy.  Neurological: He is alert and oriented to person, place, and time.  Skin: Skin is warm and dry. No rash noted.  Psychiatric: Affect normal.  Vitals reviewed.   Recent Results (from the past 2160 hour(s))  CBC w/Diff     Status: Abnormal   Collection Time: 06/16/14  9:42 AM  Result Value Ref Range   WBC 4.1 4.0 - 10.5 K/uL   RBC 4.92 4.22 - 5.81 Mil/uL   Hemoglobin 13.5 13.0 - 17.0 g/dL   HCT 41.9 39.0 - 52.0 %   MCV 85.3 78.0 - 100.0 fl   MCHC 32.2 30.0 - 36.0 g/dL   RDW 13.2 11.5 - 15.5 %   Platelets 307.0 150.0 - 400.0 K/uL   Neutrophils Relative % 37.6 (L) 43.0 - 77.0 %   Lymphocytes Relative 43.9 12.0 - 46.0 %   Monocytes Relative 14.2 (H) 3.0 - 12.0 %   Eosinophils Relative 3.7 0.0 - 5.0 %   Basophils Relative 0.6 0.0 - 3.0 %   Neutro Abs 1.5 1.4 - 7.7 K/uL   Lymphs Abs 1.8 0.7 - 4.0 K/uL   Monocytes Absolute 0.6 0.1 - 1.0 K/uL   Eosinophils Absolute 0.2 0.0 - 0.7 K/uL   Basophils Absolute 0.0 0.0 - 0.1 K/uL  Hemoglobin A1c     Status: None   Collection Time: 06/16/14  9:42 AM  Result Value Ref Range   Hgb A1c MFr Bld 5.9 4.6 - 6.5 %    Comment: Glycemic Control Guidelines for People with Diabetes:Non Diabetic:  <6%Goal of Therapy: <7%Additional Action Suggested:  >8%   Comprehensive metabolic panel     Status: Abnormal   Collection Time: 06/16/14  9:42 AM  Result Value Ref Range   Sodium 138 135 - 145 mEq/L   Potassium 4.4 3.5 - 5.1 mEq/L   Chloride 102 96 - 112 mEq/L   CO2 30 19 - 32 mEq/L   Glucose, Bld 105 (H) 70 - 99 mg/dL   BUN 10 6 - 23 mg/dL   Creatinine, Ser 1.08 0.40 - 1.50 mg/dL   Total Bilirubin 0.8 0.2 - 1.2 mg/dL   Alkaline Phosphatase 68 39 - 117 U/L   AST 17 0  - 37 U/L   ALT 16 0 - 53 U/L   Total Protein 6.9 6.0 - 8.3 g/dL   Albumin 3.8 3.5 - 5.2 g/dL  Calcium 9.6 8.4 - 10.5 mg/dL   GFR 86.79 >60.00 mL/min  TSH     Status: None   Collection Time: 06/16/14  9:42 AM  Result Value Ref Range   TSH 1.52 0.35 - 4.50 uIU/mL  Urinalysis, Routine w reflex microscopic     Status: None   Collection Time: 06/16/14  9:42 AM  Result Value Ref Range   Color, Urine YELLOW Yellow;Lt. Yellow   APPearance CLEAR Clear   Specific Gravity, Urine 1.010 1.000-1.030   pH 6.5 5.0 - 8.0   Total Protein, Urine NEGATIVE Negative   Urine Glucose NEGATIVE Negative   Ketones, ur NEGATIVE Negative   Bilirubin Urine NEGATIVE Negative   Hgb urine dipstick NEGATIVE Negative   Urobilinogen, UA 0.2 0.0 - 1.0   Leukocytes, UA NEGATIVE Negative   Nitrite NEGATIVE Negative   WBC, UA 0-2/hpf 0-2/hpf   Squamous Epithelial / LPF Rare(0-4/hpf) Rare(0-4/hpf)  Lipid panel     Status: Abnormal   Collection Time: 06/16/14  9:42 AM  Result Value Ref Range   Cholesterol 208 (H) 0 - 200 mg/dL    Comment: ATP III Classification       Desirable:  < 200 mg/dL               Borderline High:  200 - 239 mg/dL          High:  > = 240 mg/dL   Triglycerides 192.0 (H) 0.0 - 149.0 mg/dL    Comment: Normal:  <150 mg/dLBorderline High:  150 - 199 mg/dL   HDL 49.70 >39.00 mg/dL   VLDL 38.4 0.0 - 40.0 mg/dL   LDL Cholesterol 120 (H) 0 - 99 mg/dL   Total CHOL/HDL Ratio 4     Comment:                Men          Women1/2 Average Risk     3.4          3.3Average Risk          5.0          4.42X Average Risk          9.6          7.13X Average Risk          15.0          11.0                       NonHDL 158.30     Comment: NOTE:  Non-HDL goal should be 30 mg/dL higher than patient's LDL goal (i.e. LDL goal of < 70 mg/dL, would have non-HDL goal of < 100 mg/dL)  PSA     Status: None   Collection Time: 06/16/14  9:42 AM  Result Value Ref Range   PSA 0.52 0.10 - 4.00 ng/mL  Vitamin B12     Status:  None   Collection Time: 06/16/14  9:42 AM  Result Value Ref Range   Vitamin B-12 412 211 - 911 pg/mL  POCT urinalysis dipstick     Status: Normal   Collection Time: 08/18/14  7:22 AM  Result Value Ref Range   Color, UA light yellow    Clarity, UA clear    Glucose, UA neg    Bilirubin, UA neg    Ketones, UA neg    Spec Grav, UA 1.010    Blood, UA neg    pH, UA 6.0  Protein, UA neg    Urobilinogen, UA negative    Nitrite, UA neg    Leukocytes, UA Negative     Assessment/Plan: Hematuria Traumatic.  One episode.  Resolved.  Urine dip negative.  Exam within normal limits bilaterally. Supportive measures discussed.   Acute bacterial bronchitis Rx Bactrim.  Mucinex.  Rx Tessalon for cough.  Supportive measures and return precautions discussed with patient.

## 2014-08-18 NOTE — Assessment & Plan Note (Signed)
Rx Bactrim.  Mucinex.  Rx Tessalon for cough.  Supportive measures and return precautions discussed with patient.

## 2014-08-18 NOTE — Patient Instructions (Signed)
Please take your antibiotic as directed.   Stay well hydrated. Use Mucinex for congestion. Use the Tessalon as directed for the cough. Place a humidifier in the bedroom.  For the bleeding -- I am glad this has stopped.  This is most likely due to the trauma to the area.  Stay well hydrated and try to avoid holding your bladder. I will call you with your results.

## 2014-08-18 NOTE — Progress Notes (Signed)
Pre visit review using our clinic review tool, if applicable. No additional management support is needed unless otherwise documented below in the visit note. 

## 2014-09-08 ENCOUNTER — Encounter: Payer: Self-pay | Admitting: Critical Care Medicine

## 2014-09-28 ENCOUNTER — Ambulatory Visit (INDEPENDENT_AMBULATORY_CARE_PROVIDER_SITE_OTHER): Payer: MEDICARE | Admitting: Medical

## 2014-09-28 ENCOUNTER — Encounter: Payer: Self-pay | Admitting: Medical

## 2014-09-28 VITALS — BP 122/83 | HR 55 | Temp 97.8°F | Ht 65.25 in | Wt 188.6 lb

## 2014-09-28 DIAGNOSIS — T7840XA Allergy, unspecified, initial encounter: Secondary | ICD-10-CM | POA: Diagnosis not present

## 2014-09-28 DIAGNOSIS — R739 Hyperglycemia, unspecified: Secondary | ICD-10-CM | POA: Diagnosis not present

## 2014-09-28 DIAGNOSIS — R21 Rash and other nonspecific skin eruption: Secondary | ICD-10-CM | POA: Diagnosis not present

## 2014-09-28 LAB — GLUCOSE, POCT (MANUAL RESULT ENTRY): POC GLUCOSE: 105 mg/dL — AB (ref 70–99)

## 2014-09-28 MED ORDER — METHYLPREDNISOLONE ACETATE 40 MG/ML IJ SUSP
40.0000 mg | Freq: Once | INTRAMUSCULAR | Status: AC
  Administered 2014-09-28: 40 mg via INTRAMUSCULAR

## 2014-09-28 MED ORDER — HYDROXYZINE HCL 10 MG PO TABS: 10.0000 mg | ORAL_TABLET | Freq: Three times a day (TID) | ORAL | Status: DC | PRN

## 2014-09-28 MED ORDER — PREDNISONE 20 MG PO TABS: ORAL_TABLET | ORAL | Status: DC

## 2014-09-28 NOTE — Progress Notes (Signed)
Subjective:    Patient ID: Brian [name], male    DOB: 1946-04-26, 69 y.o.   MRN: [id number]  HPI  Pt in with rash on face both sides of face(When he first woke up in am). Both lower lids look very swollen per pt. My first time seeing him and reassures me this is a lot worse that baseline appearance. Both eyes itch. Rt side is worst than left. No wheezing. No history of severe allergic rections to anything.  On review. Reports rash occurred after no known particular exposure. On review pt does not report any suspicious exposure to soaps, creams, new meds, detergents, make up, lotions, detergents, animal exposure exposure, plants or insect bites.  Pt rash is in the area of. Pt reports no shortness of breath or wheezing. . Pt is not diabetic.   Pt lt eye blind some chronic changes. Not new. Eye condition present since around 2008.  Review of Systems  Constitutional: Negative for fever, chills, diaphoresis, activity change and fatigue.  Respiratory: Negative for cough, chest tightness and shortness of breath.   Cardiovascular: Negative for chest pain, palpitations and leg swelling.  Gastrointestinal: Negative for nausea, vomiting and abdominal pain.  Musculoskeletal: Negative for neck pain and neck stiffness.  Skin: Positive for rash.       Face.   Neurological: Negative for dizziness, tremors, seizures, syncope, facial asymmetry, speech difficulty, weakness, light-headedness, numbness and headaches.  Psychiatric/Behavioral: Negative for behavioral problems, confusion and agitation. The patient is not nervous/anxious.    Past Medical History  Diagnosis Date  . Chicken pox   . GERD (gastroesophageal reflux disease)   . Measles   . [place] measles   . Mumps   . Hyperlipidemia   . Edema     Left Leg  . Essential hypertension, benign   . Blind right eye   . Heart attack   . Chronic back pain   . Dislocation, jaw 1960s  . Personal history of other diseases of digestive system 08.10.12      Gastric ulcer  . 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 Osteopenia 11.12.10    bone density test  . 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 Atypical chest pain      Negative cardiac catheterization 2014    History   Social History  . Marital Status: Single    Spouse Name: N/A  . Number of Children: N/A  . Years of Education: N/A   Occupational History  . Not on file.   Social History Main Topics  . Smoking status: Former Smoker -- 0.25 packs/day for 2 years    Types: Cigarettes    Quit date: 06/02/1966  . Smokeless tobacco: Never Used  . Alcohol Use: No  . Drug Use: No  . Sexual Activity: Not on file   Other Topics Concern  . Not on file   Social History Narrative    Past Surgical History  Procedure Laterality Date  . Appendectomy  1973  . Abdominal hernia repair  1972  . Eye surgery  1972    Bilateral Lens Replacement  . Mandible surgery  1968, 2008    Dislocated Jaw  . Wisdom tooth extraction    . Cataract extraction, bilateral  2005  . 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 Esophagogastroduodenoscopy  06.2011  .  Facial reconstruction surgery  2008  . Left heart cath  02.17.2014    Family History  Problem Relation Age of Onset  . Alzheimer's disease Mother     Deceased  . Diabetes Maternal Grandmother   . Heart attack Maternal Grandmother   . Prostate cancer Maternal Uncle   . Diabetes Maternal Uncle   . Healthy Son   . Cataracts Maternal Aunt     No Known Allergies  Current Outpatient Prescriptions on File Prior to Visit  Medication Sig Dispense Refill  . aspirin (ASPIR-81) 81 MG EC tablet Take 1 tablet by mouth daily.    Marland Kitchen b complex vitamins tablet Take 1 tablet by mouth daily.    . Calcium Carbonate-Vitamin D (CALCIUM-VITAMIN D3 PO) Take by mouth daily.    . hydrochlorothiazide (MICROZIDE)  12.5 MG capsule Take 1 capsule (12.5 mg total) by mouth daily. 90 capsule 1  . omeprazole (PRILOSEC) 40 MG capsule Take 1 capsule (40 mg total) by mouth daily. 90 capsule 0  . simvastatin (ZOCOR) 40 MG tablet Take 1 tablet (40 mg total) by mouth every evening. 90 tablet 1  . timolol (TIMOPTIC) 0.5 % ophthalmic solution Place 1 drop into the left eye daily. 10 mL 12  . benzonatate (TESSALON) 100 MG capsule Take 1-2 capsules (100-200 mg total) by mouth 2 (two) times daily as needed for cough. 30 capsule 0   No current facility-administered medications on file prior to visit.    BP 122/83 mmHg  Pulse 55  Temp(Src) 97.8 F (36.6 C) (Oral)  Ht 5' 5.25" (1.657 m)  Wt 188 lb 9.6 oz (85.548 kg)  BMI 31.16 kg/m2  SpO2 96%      Objective:   Physical Exam   General  Mental Status - Alert. General Appearance - Well groomed. Not in acute distress.  Skin Rashes- lower eye lids very puffy and red. Not tender to touch, and not indurated. Rt side of nose 4 small papular appearance. Both zygomatic area regions faint red rash.(no vesicle seen and rash is definitely past midline)  HEENT Head- Normal. Ear Auditory Canal - Left- Normal. Right - Normal.Tympanic Membrane- Left- Normal. Right- Normal. Eye Sclera/Conjunctiva- Left- Normal. Right- Normal. Nose & Sinuses Nasal Mucosa- Left-  Not boggy or Congested. Right-  Not  boggy or Congested. Mouth & Throat Lips: Upper Lip- Normal: no dryness, cracking, pallor, cyanosis, or vesicular eruption. Lower Lip-Normal: no dryness, cracking, pallor, cyanosis or vesicular eruption. Buccal Mucosa- Bilateral- No Aphthous ulcers. Oropharynx- No Discharge or Erythema. Tonsils: Characteristics- Bilateral- No Erythema or Congestion. Size/Enlargement- Bilateral- No enlargement. Discharge- bilateral-None.  Neck Neck- Supple. No Masses.    Chest and Lung Exam Auscultation: Breath Sounds:- even and unlabored. Clear.  Cardiovascular Auscultation:Rythm-  Regular, rate and rhythm. Murmurs & Other Heart Sounds:Ausculatation of the heart reveal- No Murmurs.  Lymphatic Head & Neck General Head & Neck Lymphatics: Bilateral: Description- No Localized lymphadenopathy.       Assessment & Plan:  Note this rash looks allergic and not infectious. Particularly not shingles like.Passes mid line.

## 2014-09-28 NOTE — Patient Instructions (Signed)
Hyperglycemia Got fsbs to make sure glucose not high in light of treatment with steroids for allergies.   Allergic reaction Etiology or reaction undetermined. Depomedrol 40 mg im. Prednisone 20 mg 1 tab 3 times a day.  Hydroxyzine low dose 3 times a day as needed itching.  If rash worsens as approach weekend or over weekend then ED evaluation.  Follow up on Monday or as needed.

## 2014-09-28 NOTE — Progress Notes (Signed)
Pre visit review using our clinic review tool, if applicable. No additional management support is needed unless otherwise documented below in the visit note. 

## 2014-09-28 NOTE — Assessment & Plan Note (Signed)
Etiology or reaction undetermined. Depomedrol 40 mg im. Prednisone 20 mg 1 tab 3 times a day.  Hydroxyzine low dose 3 times a day as needed itching.  If rash worsens as approach weekend or over weekend then ED evaluation.  Follow up on Monday or as needed.

## 2014-09-28 NOTE — Assessment & Plan Note (Signed)
Got fsbs to make sure glucose not high in light of treatment with steroids for allergies.

## 2014-10-02 ENCOUNTER — Encounter: Payer: Self-pay | Admitting: Medical

## 2014-10-02 ENCOUNTER — Ambulatory Visit (INDEPENDENT_AMBULATORY_CARE_PROVIDER_SITE_OTHER): Payer: MEDICARE | Admitting: Medical

## 2014-10-02 VITALS — BP 144/84 | HR 72 | Temp 98.0°F | Ht 65.25 in | Wt 188.0 lb

## 2014-10-02 DIAGNOSIS — T7840XD Allergy, unspecified, subsequent encounter: Secondary | ICD-10-CM

## 2014-10-02 NOTE — Progress Notes (Signed)
Pre visit review using our clinic review tool, if applicable. No additional management support is needed unless otherwise documented below in the visit note. 

## 2014-10-02 NOTE — Assessment & Plan Note (Addendum)
Pt is much better. Looks almost completely resolved. About (95)%.   Continue current treatment until finished with med.

## 2014-10-02 NOTE — Patient Instructions (Addendum)
Allergic reaction Pt is much better. Looks almost completely resolved. About (95)%.   Continue current treatment until finished with med.   Continue current meds(prednisone and hydroxyzine) until finished complete course.  If rash and swelling reoccurs will sent to allergist. If rash and swelling reoccurs with respiratrry issues then ED evaluation.

## 2014-10-02 NOTE — Progress Notes (Signed)
Subjective:    Patient ID: Brian [name], male    DOB: December 28, 1945, 69 y.o.   MRN: [id number]  HPI  Follow up on allergic rxn etiology unkown. Pt did respond to treatment in about 1.5 day. Swelling and itching to his face now resolved.   Review of Systems  Constitutional: Negative for fever, chills, diaphoresis, activity change and fatigue.  Respiratory: Negative for cough, chest tightness and shortness of breath.   Cardiovascular: Negative for chest pain, palpitations and leg swelling.  Gastrointestinal: Negative for nausea, vomiting and abdominal pain.  Musculoskeletal: Negative for neck pain and neck stiffness.  Skin: Negative for rash.       No itching.  Neurological: Negative for dizziness, tremors, seizures, syncope, facial asymmetry, speech difficulty, weakness, light-headedness, numbness and headaches.  Psychiatric/Behavioral: Negative for behavioral problems, confusion and agitation. The patient is not nervous/anxious.    Past Medical History  Diagnosis Date  . Chicken pox   . GERD (gastroesophageal reflux disease)   . Measles   . [place] measles   . Mumps   . Hyperlipidemia   . Edema     Left Leg  . Essential hypertension, benign   . Blind right eye   . Heart attack   . Chronic back pain   . Dislocation, jaw 1960s  . Personal history of other diseases of digestive system 08.10.12    Gastric ulcer  . 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 Osteopenia 11.12.10    bone density test  . 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 Atypical chest pain      Negative cardiac catheterization 2014    History   Social History  . Marital Status: Single    Spouse Name: N/A  . Number of Children: N/A  . Years of Education: N/A   Occupational History  . Not on file.   Social History Main Topics  . Smoking status: Former Smoker -- 0.25 packs/day for 2 years    Types: Cigarettes    Quit date: 06/02/1966  . Smokeless tobacco: Never Used  . Alcohol Use: No    . Drug Use: No  . Sexual Activity: Not on file   Other Topics Concern  . Not on file   Social History Narrative    Past Surgical History  Procedure Laterality Date  . Appendectomy  1973  . Abdominal hernia repair  1972  . Eye surgery  1972    Bilateral Lens Replacement  . Mandible surgery  1968, 2008    Dislocated Jaw  . Wisdom tooth extraction    . Cataract extraction, bilateral  2005  . 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 Esophagogastroduodenoscopy  06.2011  . Facial reconstruction surgery  2008  . Left heart cath  02.17.2014    Family History  Problem Relation Age of Onset  . Alzheimer's disease Mother     Deceased  . Diabetes Maternal Grandmother   . Heart attack Maternal Grandmother   . Prostate cancer Maternal Uncle   . Diabetes Maternal Uncle   . Healthy Son   . Cataracts Maternal Aunt     No Known Allergies  Current Outpatient Prescriptions on File Prior to Visit  Medication Sig Dispense Refill  . aspirin (ASPIR-81) 81 MG EC tablet Take 1 tablet by mouth daily.    Marland Kitchen  b complex vitamins tablet Take 1 tablet by mouth daily.    . benzonatate (TESSALON) 100 MG capsule Take 1-2 capsules (100-200 mg total) by mouth 2 (two) times daily as needed for cough. 30 capsule 0  . Calcium Carbonate-Vitamin D (CALCIUM-VITAMIN D3 PO) Take by mouth daily.    . hydrochlorothiazide (MICROZIDE) 12.5 MG capsule Take 1 capsule (12.5 mg total) by mouth daily. 90 capsule 1  . hydrOXYzine (ATARAX/VISTARIL) 10 MG tablet Take 1 tablet (10 mg total) by mouth 3 (three) times daily as needed for itching. 15 tablet 0  . omeprazole (PRILOSEC) 40 MG capsule Take 1 capsule (40 mg total) by mouth daily. 90 capsule 0  . predniSONE (DELTASONE) 20 MG tablet 1 tab po tid x 5 days 15 tablet 0  . simvastatin (ZOCOR) 40 MG tablet Take 1 tablet (40 mg total) by mouth  every evening. 90 tablet 1  . timolol (TIMOPTIC) 0.5 % ophthalmic solution Place 1 drop into the left eye daily. 10 mL 12   No current facility-administered medications on file prior to visit.    BP 144/84 mmHg  Pulse 72  Temp(Src) 98 F (36.7 C) (Oral)  Ht 5' 5.25" (1.657 m)  Wt 188 lb (85.276 kg)  BMI 31.06 kg/m2  SpO2 96%      Objective:   Physical Exam  General- No acute distress. Pleasant patient. Neck- Full range of motion, no jvd Lungs- Clear, even and unlabored. Heart- regular rate and rhythm. Neurologic- CNII- XII grossly intact. Skin- swelling and itching to his face appears resolved.(Only faint in left zygomatic arch area)        Assessment & Plan:

## 2014-10-18 ENCOUNTER — Ambulatory Visit (INDEPENDENT_AMBULATORY_CARE_PROVIDER_SITE_OTHER): Payer: MEDICARE | Admitting: Physician Assistant

## 2014-10-18 ENCOUNTER — Encounter: Payer: Self-pay | Admitting: Physician Assistant

## 2014-10-18 VITALS — BP 118/75 | HR 66 | Temp 98.4°F | Ht 65.25 in | Wt 185.8 lb

## 2014-10-18 DIAGNOSIS — G609 Hereditary and idiopathic neuropathy, unspecified: Secondary | ICD-10-CM | POA: Diagnosis not present

## 2014-10-18 DIAGNOSIS — M62838 Other muscle spasm: Secondary | ICD-10-CM | POA: Insufficient documentation

## 2014-10-18 DIAGNOSIS — R350 Frequency of micturition: Secondary | ICD-10-CM

## 2014-10-18 DIAGNOSIS — M6248 Contracture of muscle, other site: Secondary | ICD-10-CM

## 2014-10-18 DIAGNOSIS — N419 Inflammatory disease of prostate, unspecified: Secondary | ICD-10-CM | POA: Diagnosis not present

## 2014-10-18 DIAGNOSIS — R3911 Hesitancy of micturition: Secondary | ICD-10-CM | POA: Insufficient documentation

## 2014-10-18 LAB — POCT URINALYSIS DIPSTICK
BILIRUBIN UA: NEGATIVE
GLUCOSE UA: NEGATIVE
Ketones, UA: NEGATIVE
Leukocytes, UA: NEGATIVE
Nitrite, UA: NEGATIVE
Protein, UA: NEGATIVE
RBC UA: NEGATIVE
SPEC GRAV UA: 1.01
Urobilinogen, UA: 0.2
pH, UA: 6

## 2014-10-18 MED ORDER — TAMSULOSIN HCL 0.4 MG PO CAPS: 0.4000 mg | ORAL_CAPSULE | Freq: Every day | ORAL | Status: DC

## 2014-10-18 MED ORDER — CIPROFLOXACIN HCL 500 MG PO TABS: 500.0000 mg | ORAL_TABLET | Freq: Two times a day (BID) | ORAL | Status: DC

## 2014-10-18 MED ORDER — METHOCARBAMOL 500 MG PO TABS: 500.0000 mg | ORAL_TABLET | Freq: Three times a day (TID) | ORAL | Status: DC | PRN

## 2014-10-18 NOTE — Assessment & Plan Note (Signed)
Symptoms consistent with this.  Will Rx Flomax and Cipro.  Will obtain culture.  Alarm signs/symptoms discussed with patient.  Follow-up will be based on culture results.

## 2014-10-18 NOTE — Assessment & Plan Note (Signed)
Discussed possible causes and treatment options.  Patient declines Rx.  Recommended trial of Aspercreme with lidocaine to the area to see if helping.  If not, follow-up in office for further assessment.

## 2014-10-18 NOTE — Progress Notes (Signed)
Patient presents to clinic today c/o 2 weeks of urinary frequency, but decreased urinary stream, nocturia x 3, and low back pain and suprapubic pressure. Denies fever, chills, nausea or vomiting. Denies history of enlarged prostate.  Endorses a tight-band feeling around head over the past few days.  Denies headaches.  Denies trauma or injury. Endorses tightness in sternum up to ears.    Patient also endorses crawling sensation over bilateral lower extremities.  Endorses R knee will buckle when standing, but this very rare.   Past Medical History  Diagnosis Date  . Chicken pox   . GERD (gastroesophageal reflux disease)   . Measles   . [place] measles   . Mumps   . Hyperlipidemia   . Edema     Left Leg  . Essential hypertension, benign   . Blind right eye   . Heart attack   . Chronic back pain   . Dislocation, jaw 1960s  . Personal history of other diseases of digestive system 08.10.12    Gastric ulcer  . 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 Osteopenia 11.12.10    bone density test  . 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 Atypical chest pain      Negative cardiac catheterization 2014    Current Outpatient Prescriptions on File Prior to Visit  Medication Sig Dispense Refill  . aspirin (ASPIR-81) 81 MG EC tablet Take 1 tablet by mouth daily.    Marland Kitchen b complex vitamins tablet Take 1 tablet by mouth daily.    . Calcium Carbonate-Vitamin D (CALCIUM-VITAMIN D3 PO) Take by mouth daily.    . hydrochlorothiazide (MICROZIDE) 12.5 MG capsule Take 1 capsule (12.5 mg total) by mouth daily. 90 capsule 1  . omeprazole (PRILOSEC) 40 MG capsule Take 1 capsule (40 mg total) by mouth daily. 90 capsule 0  . simvastatin (ZOCOR) 40 MG tablet Take 1 tablet (40 mg total) by mouth every evening. 90 tablet 1  . timolol (TIMOPTIC) 0.5 % ophthalmic solution Place 1 drop into the left eye daily. 10 mL 12   No current facility-administered medications on file prior to visit.    No Known  Allergies  Family History  Problem Relation Age of Onset  . Alzheimer's disease Mother     Deceased  . Diabetes Maternal Grandmother   . Heart attack Maternal Grandmother   . Prostate cancer Maternal Uncle   . Diabetes Maternal Uncle   . Healthy Son   . Cataracts Maternal Aunt     History   Social History  . Marital Status: Single    Spouse Name: N/A  . Number of Children: N/A  . Years of Education: N/A   Social History Main Topics  . Smoking status: Former Smoker -- 0.25 packs/day for 2 years    Types: Cigarettes    Quit date: 06/02/1966  . Smokeless tobacco: Never Used  . Alcohol Use: No  . Drug Use: No  . Sexual Activity: Not on file   Other Topics Concern  . None   Social History Narrative   Review of Systems - See HPI.  All other ROS are negative.  BP 118/75 mmHg  Pulse 66  Temp(Src) 98.4 F (36.9 C) (Oral)  Ht 5' 5.25" (1.657 m)  Wt 185 lb 12.8 oz (84.278 kg)  BMI 30.70 kg/m2  SpO2 98%  Physical Exam  Constitutional: He is oriented to person, place, and time and well-developed, well-nourished, and in no distress.  HENT:  Head: Normocephalic and atraumatic.  Eyes: Conjunctivae are normal.  Cardiovascular: Normal rate, regular rhythm, normal heart sounds and intact distal pulses.   Pulmonary/Chest: Effort normal and breath sounds normal. No respiratory distress. He has no wheezes. He has no rales. He exhibits no tenderness.  Abdominal:  Suprapubic pressure noted with palpation.  Negative CVA tenderness.  Musculoskeletal:       Thoracic back: Normal.       Back:  Neurological: He is alert and oriented to person, place, and time. He has normal reflexes. Coordination normal.  Skin: Skin is warm and dry. No rash noted.  Psychiatric: Affect normal.  Vitals reviewed.   Recent Results (from the past 2160 hour(s))  POCT urinalysis dipstick     Status: Normal   Collection Time: 08/18/14  7:22 AM  Result Value Ref Range   Color, UA light yellow     Clarity, UA clear    Glucose, UA neg    Bilirubin, UA neg    Ketones, UA neg    Spec Grav, UA 1.010    Blood, UA neg    pH, UA 6.0    Protein, UA neg    Urobilinogen, UA negative    Nitrite, UA neg    Leukocytes, UA Negative   POCT Glucose (CBG)     Status: Normal   Collection Time: 09/28/14  8:38 AM  Result Value Ref Range   POC Glucose 105 (A) 70 - 99 mg/dl  POCT Urinalysis Dipstick     Status: None   Collection Time: 10/18/14  7:57 AM  Result Value Ref Range   Color, UA light-yellow    Clarity, UA clear    Glucose, UA neg    Bilirubin, UA neg    Ketones, UA neg    Spec Grav, UA 1.010    Blood, UA neg    pH, UA 6.0    Protein, UA neg    Urobilinogen, UA 0.2    Nitrite, UA neg    Leukocytes, UA Negative    Assessment/Plan: Trapezius muscle spasm Rx Robaxin at bedtime.  May also use twice daily throughout day if staying at home.  Topical Aspercreme to the area.  Avoid heavy lifting or overexertion.  Follow-up PRN.   Prostatitis Symptoms consistent with this.  Will Rx Flomax and Cipro.  Will obtain culture.  Alarm signs/symptoms discussed with patient.  Follow-up will be based on culture results.   Idiopathic neuropathy Discussed possible causes and treatment options.  Patient declines Rx.  Recommended trial of Aspercreme with lidocaine to the area to see if helping.  If not, follow-up in office for further assessment.

## 2014-10-18 NOTE — Progress Notes (Signed)
Pre visit review using our clinic review tool, if applicable. No additional management support is needed unless otherwise documented below in the visit note. 

## 2014-10-18 NOTE — Patient Instructions (Signed)
For urinary symptoms -- please increase fluids, take antibiotic as directed and begin Flomax at bedtime to help increase your urinary flow. If you develop fever, chills, nausea or vomiting.    For muscle strain -- take Robaxin at bedtime to help calm this down.  Avoid heavy lifting and start applying topical Aspercreme or Icy Hot to the area to help alleviate tension.  For legs -- apply Aspercreme with lidocaine to see if this helps.  If not, we will need to start a medication.

## 2014-10-18 NOTE — Assessment & Plan Note (Signed)
Rx Robaxin at bedtime.  May also use twice daily throughout day if staying at home.  Topical Aspercreme to the area.  Avoid heavy lifting or overexertion.  Follow-up PRN.

## 2014-10-19 LAB — URINE CULTURE: Colony Count: 8000

## 2014-10-27 ENCOUNTER — Ambulatory Visit: Payer: MEDICARE | Admitting: Physician Assistant

## 2014-11-04 ENCOUNTER — Other Ambulatory Visit: Payer: Self-pay | Admitting: Physician Assistant

## 2014-11-21 ENCOUNTER — Telehealth: Payer: Self-pay | Admitting: Physician Assistant

## 2014-11-21 NOTE — Telephone Encounter (Signed)
Patient states that he has been having chest pain and back pain. Call transferred to [facility]

## 2014-11-21 NOTE — Telephone Encounter (Signed)
Patient Name: Brian [name] DOB: 10-Jan-1946 Initial Comment Caller states he is having chest pain and back pain. Nurse Assessment Nurse: Ronnald Ramp, RN, Miranda Date/Time (Eastern Time): 11/21/2014 1:59:15 PM Confirm and document reason for call. If symptomatic, describe symptoms. ---Caller states he has been having back pain off and on for several months. For the last 2-3 days he has been having pain in his upper abdomen off and on, lasts only a few seconds. Has the patient traveled out of the country within the last 30 days? ---Not Applicable Does the patient require triage? ---Yes Related visit to physician within the last 2 weeks? ---No Does the PT have any chronic conditions? (i.e. diabetes, asthma, etc.) ---Yes List chronic conditions. ---GERD, HTN, High Cholesterol Guidelines Guideline Title Affirmed Question Affirmed Notes Abdominal Pain - Upper [1] MODERATE pain (e.g., interferes with normal activities) AND [2] comes and goes (cramps) AND [3] present > 24 hours (Exception: pain with Vomiting or Diarrhea - see that Guideline) Final Disposition User See Physician within 24 Hours Ronnald Ramp, RN, Miranda Comments Appt scheduled for tomorrow, 6/22 at 10am with Elyn Aquas

## 2014-11-21 NOTE — Telephone Encounter (Signed)
Patient scheduled.

## 2014-11-22 ENCOUNTER — Encounter: Payer: Self-pay | Admitting: Physician Assistant

## 2014-11-22 ENCOUNTER — Ambulatory Visit (INDEPENDENT_AMBULATORY_CARE_PROVIDER_SITE_OTHER): Payer: MEDICARE | Admitting: Physician Assistant

## 2014-11-22 VITALS — BP 116/70 | HR 71 | Temp 98.7°F | Resp 18 | Ht 66.0 in | Wt 185.8 lb

## 2014-11-22 DIAGNOSIS — K2100 Gastro-esophageal reflux disease with esophagitis, without bleeding: Secondary | ICD-10-CM

## 2014-11-22 DIAGNOSIS — R3911 Hesitancy of micturition: Secondary | ICD-10-CM | POA: Diagnosis not present

## 2014-11-22 MED ORDER — OMEPRAZOLE 40 MG PO CPDR: DELAYED_RELEASE_CAPSULE | ORAL | Status: DC

## 2014-11-22 MED ORDER — TAMSULOSIN HCL 0.4 MG PO CAPS: 0.4000 mg | ORAL_CAPSULE | Freq: Every day | ORAL | Status: DC

## 2014-11-22 MED ORDER — RANITIDINE HCL 150 MG PO TABS: 150.0000 mg | ORAL_TABLET | Freq: Every day | ORAL | Status: DC

## 2014-11-22 NOTE — Progress Notes (Signed)
Pre visit review using our clinic review tool, if applicable. No additional management support is needed unless otherwise documented below in the visit note. 

## 2014-11-22 NOTE — Assessment & Plan Note (Signed)
Continue Prilosec. Add on QHS Ranitidine 150 mg. Probiotic.  Supportive measures reviewed. Follow-up 2 weeks.  Return sooner PRN.

## 2014-11-22 NOTE — Progress Notes (Signed)
Patient presents to clinic today c/o heart burn and acid reflux over the past week despite taking Prilosec daily.  Denies change in diet.  Endorses some discomfort in throat but denies SOB or difficulty breathing.  Patient also notes urinary hesitancy and suprapubic pressure.  Denies dysuria, urinary urgency or hesitancy.  Denies fever, chills, malaise or fatigue.  Endorses good bowel output.  Has not taken Flomax as directed.  Past Medical History  Diagnosis Date  . Chicken pox   . GERD (gastroesophageal reflux disease)   . Measles   . [place] measles   . Mumps   . Hyperlipidemia   . Edema     Left Leg  . Essential hypertension, benign   . Blind right eye   . Heart attack   . Chronic back pain   . Dislocation, jaw 1960s  . Personal history of other diseases of digestive system 08.10.12    Gastric ulcer  . 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 Osteopenia 11.12.10    bone density test  . 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 Atypical chest pain      Negative cardiac catheterization 2014    Current Outpatient Prescriptions on File Prior to Visit  Medication Sig Dispense Refill  . aspirin (ASPIR-81) 81 MG EC tablet Take 1 tablet by mouth daily.    Marland Kitchen b complex vitamins tablet Take 1 tablet by mouth daily.    . Calcium Carbonate-Vitamin D (CALCIUM-VITAMIN D3 PO) Take by mouth daily.    . hydrochlorothiazide (MICROZIDE) 12.5 MG capsule Take 1 capsule (12.5 mg total) by mouth daily. 90 capsule 1  . simvastatin (ZOCOR) 40 MG tablet Take 1 tablet (40 mg total) by mouth every evening. 90 tablet 1  . timolol (TIMOPTIC) 0.5 % ophthalmic solution Place 1 drop into the left eye daily. 10 mL 12  . methocarbamol (ROBAXIN) 500 MG tablet Take 1 tablet (500 mg total) by mouth every 8 (eight) hours as needed for muscle spasms. (Patient not taking: Reported on 11/22/2014) 30 tablet 0   No current facility-administered medications on file prior to visit.    No Known  Allergies  Family History  Problem Relation Age of Onset  . Alzheimer's disease Mother     Deceased  . Diabetes Maternal Grandmother   . Heart attack Maternal Grandmother   . Prostate cancer Maternal Uncle   . Diabetes Maternal Uncle   . Healthy Son   . Cataracts Maternal Aunt     History   Social History  . Marital Status: Single    Spouse Name: N/A  . Number of Children: N/A  . Years of Education: N/A   Social History Main Topics  . Smoking status: Former Smoker -- 0.25 packs/day for 2 years    Types: Cigarettes    Quit date: 06/02/1966  . Smokeless tobacco: Never Used  . Alcohol Use: No  . Drug Use: No  . Sexual Activity: Not on file   Other Topics Concern  . None   Social History Narrative    Review of Systems - See HPI.  All other ROS are negative.  BP 116/70 mmHg  Pulse 71  Temp(Src) 98.7 F (37.1 C) (Oral)  Resp 18  Ht 5\' 6"  (1.676 m)  Wt 185 lb 12.8 oz (84.278 kg)  BMI 30.00 kg/m2  SpO2 97%  Physical Exam  Constitutional: He is oriented to person, place, and time and well-developed, well-nourished, and in no distress.  Eyes: Conjunctivae are normal.  Cardiovascular:  Normal rate, regular rhythm, normal heart sounds and intact distal pulses.   Pulmonary/Chest: Effort normal and breath sounds normal. No respiratory distress. He has no wheezes. He has no rales. He exhibits no tenderness.  Abdominal: Soft. Bowel sounds are normal. He exhibits no distension and no mass. There is no tenderness. There is no rebound and no guarding.  Neurological: He is alert and oriented to person, place, and time.  Skin: Skin is warm and dry. No rash noted.  Psychiatric: Affect normal.    Recent Results (from the past 2160 hour(s))  POCT Glucose (CBG)     Status: Normal   Collection Time: 09/28/14  8:38 AM  Result Value Ref Range   POC Glucose 105 (A) 70 - 99 mg/dl  POCT Urinalysis Dipstick     Status: None   Collection Time: 10/18/14  7:57 AM  Result Value Ref  Range   Color, UA light-yellow    Clarity, UA clear    Glucose, UA neg    Bilirubin, UA neg    Ketones, UA neg    Spec Grav, UA 1.010    Blood, UA neg    pH, UA 6.0    Protein, UA neg    Urobilinogen, UA 0.2    Nitrite, UA neg    Leukocytes, UA Negative   Urine Culture     Status: None   Collection Time: 10/18/14  9:12 AM  Result Value Ref Range   Colony Count 8,000 COLONIES/ML    Organism ID, Bacteria Insignificant Growth     Assessment/Plan: Acid reflux Continue Prilosec. Add on QHS Ranitidine 150 mg. Probiotic.  Supportive measures reviewed. Follow-up 2 weeks.  Return sooner PRN.  Urinary hesitancy Restart Flomax.  Supportive measures reviewed.  Attempted to obtain urine sample but patient unable to give at visit.  Encouraged him to return when able to give sample. Follow-up 2 weeks.

## 2014-11-22 NOTE — Patient Instructions (Signed)
Please restart Flomax daily to help with voiding.  Stay well hydrated.  Call if symptoms not resolving.  Please continue Prilosec dasily but add on a zantac at bedtime to help with acid reflux symptoms.  Food Choices for Gastroesophageal Reflux Disease When you have gastroesophageal reflux disease (GERD), the foods you eat and your eating habits are very important. Choosing the right foods can help ease the discomfort of GERD. WHAT GENERAL GUIDELINES DO I NEED TO FOLLOW?  Choose fruits, vegetables, whole grains, low-fat dairy products, and low-fat meat, fish, and poultry.  Limit fats such as oils, salad dressings, butter, nuts, and avocado.  Keep a food diary to identify foods that cause symptoms.  Avoid foods that cause reflux. These may be different for different people.  Eat frequent small meals instead of three large meals each day.  Eat your meals slowly, in a relaxed setting.  Limit fried foods.  Cook foods using methods other than frying.  Avoid drinking alcohol.  Avoid drinking large amounts of liquids with your meals.  Avoid bending over or lying down until 2-3 hours after eating. WHAT FOODS ARE NOT RECOMMENDED? The following are some foods and drinks that may worsen your symptoms: Vegetables Tomatoes. Tomato juice. Tomato and spaghetti sauce. Chili peppers. Onion and garlic. Horseradish. Fruits Oranges, grapefruit, and lemon (fruit and juice). Meats High-fat meats, fish, and poultry. This includes hot dogs, ribs, ham, sausage, salami, and bacon. Dairy Whole milk and chocolate milk. Sour cream. Cream. Butter. Ice cream. Cream cheese.  Beverages Coffee and tea, with or without caffeine. Carbonated beverages or energy drinks. Condiments Hot sauce. Barbecue sauce.  Sweets/Desserts Chocolate and cocoa. Donuts. Peppermint and spearmint. Fats and Oils High-fat foods, including [place] fries and potato chips. Other Vinegar. Strong spices, such as black pepper, white  pepper, red pepper, cayenne, curry powder, cloves, ginger, and chili powder. The items listed above may not be a complete list of foods and beverages to avoid. Contact your dietitian for more information. Document Released: 05/19/2005 Document Revised: 05/24/2013 Document Reviewed: 03/23/2013 [facility] Patient Information 2015 [place], [place]. This information is not intended to replace advice given to you by your health care provider. Make sure you discuss any questions you have with your health care provider.

## 2014-11-22 NOTE — Assessment & Plan Note (Signed)
Restart Flomax.  Supportive measures reviewed.  Attempted to obtain urine sample but patient unable to give at visit.  Encouraged him to return when able to give sample. Follow-up 2 weeks.

## 2014-12-21 ENCOUNTER — Telehealth: Payer: Self-pay | Admitting: Physician Assistant

## 2014-12-21 NOTE — Telephone Encounter (Signed)
Patient will need to speak with the surgeon to see if he is needing medical clearance from PCP or Cardiology before surgery.

## 2014-12-21 NOTE — Telephone Encounter (Signed)
Informed patient of this.  °

## 2014-12-21 NOTE — Telephone Encounter (Signed)
Caller name: Jayziah Relation to pt: Call back number: [phone] Pharmacy:  Reason for call:   Patient states that he will be having eye surgery next Wednesday and wants to know if he needs to be seen before. The dr will be taking his R eye out.

## 2015-01-09 ENCOUNTER — Other Ambulatory Visit: Payer: Self-pay | Admitting: Physician Assistant

## 2015-02-08 ENCOUNTER — Ambulatory Visit (INDEPENDENT_AMBULATORY_CARE_PROVIDER_SITE_OTHER): Payer: MEDICARE | Admitting: Medical

## 2015-02-08 ENCOUNTER — Ambulatory Visit (HOSPITAL_BASED_OUTPATIENT_CLINIC_OR_DEPARTMENT_OTHER)
Admission: RE | Admit: 2015-02-08 | Discharge: 2015-02-08 | Disposition: A | Discharge status: Home / Self Care | Payer: MEDICARE | Source: Ambulatory Visit | Attending: Medical | Admitting: Medical

## 2015-02-08 ENCOUNTER — Other Ambulatory Visit: Payer: Self-pay | Admitting: Medical

## 2015-02-08 ENCOUNTER — Encounter: Payer: Self-pay | Admitting: Medical

## 2015-02-08 VITALS — BP 128/82 | HR 61 | Temp 98.1°F | Resp 16 | Ht 65.25 in | Wt 183.0 lb

## 2015-02-08 DIAGNOSIS — M25551 Pain in right hip: Secondary | ICD-10-CM | POA: Diagnosis not present

## 2015-02-08 DIAGNOSIS — M5441 Lumbago with sciatica, right side: Secondary | ICD-10-CM

## 2015-02-08 DIAGNOSIS — M25561 Pain in right knee: Secondary | ICD-10-CM | POA: Diagnosis present

## 2015-02-08 DIAGNOSIS — M5137 Other intervertebral disc degeneration, lumbosacral region: Secondary | ICD-10-CM | POA: Insufficient documentation

## 2015-02-08 DIAGNOSIS — R935 Abnormal findings on diagnostic imaging of other abdominal regions, including retroperitoneum: Secondary | ICD-10-CM | POA: Diagnosis not present

## 2015-02-08 DIAGNOSIS — M545 Low back pain: Secondary | ICD-10-CM | POA: Diagnosis not present

## 2015-02-08 DIAGNOSIS — M4316 Spondylolisthesis, lumbar region: Secondary | ICD-10-CM | POA: Diagnosis not present

## 2015-02-08 DIAGNOSIS — M160 Bilateral primary osteoarthritis of hip: Secondary | ICD-10-CM | POA: Insufficient documentation

## 2015-02-08 MED ORDER — DICLOFENAC SODIUM 50 MG PO TBEC: 50.0000 mg | DELAYED_RELEASE_TABLET | Freq: Two times a day (BID) | ORAL | Status: DC

## 2015-02-08 MED ORDER — TRAMADOL HCL 50 MG PO TABS: 50.0000 mg | ORAL_TABLET | Freq: Three times a day (TID) | ORAL | Status: DC | PRN

## 2015-02-08 NOTE — Patient Instructions (Addendum)
Will get xray of lspine, rt hip and rt knee.(suspect will find degenerative changes as source of pain)  Rx diclofenac for pain and inflammation. Rx tramadol for moderate to severe pain.  Follow up in 2 wks or as needed.    At end did discuss with him that his sob seemed normal variant. Did discuss red flag type associated symptoms that would prompt work up. Pt expressed understanding.  Also on chief complaint listed psychciatric issues. Pt did not mention any type issues today.

## 2015-02-08 NOTE — Progress Notes (Signed)
Pre visit review using our clinic review tool, if applicable. No additional management support is needed unless otherwise documented below in the visit note. 

## 2015-02-08 NOTE — Progress Notes (Signed)
Subjective:    Patient ID: Brian [name], male    DOB: [birth year], 69 y.o.   MRN: [id number]  HPI  Pt in for evaluation. Pt states he has old age problems. He states in lower back and pain in rt knee.  Back pain is constant. Worse pain in the morning. When sits pain increase. Pain does shoot down leg at times. Some pain that feels from back to his groin area. Back pain for years.  Pain in rt  groin comes and goes. Not worse on lifting. When he gets up and walks will feel pain. Groin pain for 2 wks.  Not much hip pain.  Rt knee give out occasionally. Going up and down steps hurts worse. Pain for 2 wks.   Review of Systems  Constitutional: Negative for fever, chills and fatigue.  Respiratory: Positive for shortness of breath. Negative for cough, chest tightness and wheezing.        At very end of intervirew. He mentions some sob climbing stairs but not new has had before. No chest pain associated.  Cardiovascular: Negative for chest pain and palpitations.  Musculoskeletal: Positive for back pain. Negative for neck pain.       Rt hip pain. Rt knee pain.  Skin: Negative for rash.  Neurological: Negative for dizziness, speech difficulty, weakness and headaches.  Hematological: Negative for adenopathy. Does not bruise/bleed easily.  Psychiatric/Behavioral: Negative for suicidal ideas, hallucinations, behavioral problems and decreased concentration. The patient is not nervous/anxious.    Past Medical History  Diagnosis Date  . Chicken pox   . GERD (gastroesophageal reflux disease)   . Measles   . [place] measles   . Mumps   . Hyperlipidemia   . Edema     Left Leg  . Essential hypertension, benign   . Blind right eye   . Heart attack   . Chronic back pain   . Dislocation, jaw 1960s  . Personal history of other diseases of digestive system 08.10.12    Gastric ulcer  . 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 Osteopenia 11.12.10    bone density test  . Blind  hypotensive eye 10.14.13  . Atypical chest pain      Negative cardiac catheterization 2014    Social History   Social History  . Marital Status: Single    Spouse Name: N/A  . Number of Children: N/A  . Years of Education: N/A   Occupational History  . Not on file.   Social History Main Topics  . Smoking status: Former Smoker -- 0.25 packs/day for 2 years    Types: Cigarettes    Quit date: 06/02/1966  . Smokeless tobacco: Never Used  . Alcohol Use: No  . Drug Use: No  . Sexual Activity: Not on file   Other Topics Concern  . Not on file   Social History Narrative    Past Surgical History  Procedure Laterality Date  . Appendectomy  1973  . Abdominal hernia repair  1972  . Eye surgery  1972    Bilateral Lens Replacement  . Mandible surgery  1968, 2008    Dislocated Jaw  . Wisdom tooth extraction    . Cataract extraction, bilateral  2005  . 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 Esophagogastroduodenoscopy  06.2011  . Facial  reconstruction surgery  2008  . Left heart cath  02.17.2014    Family History  Problem Relation Age of Onset  . Alzheimer's disease Mother     Deceased  . Diabetes Maternal Grandmother   . Heart attack Maternal Grandmother   . Prostate cancer Maternal Uncle   . Diabetes Maternal Uncle   . Healthy Son   . Cataracts Maternal Aunt     No Known Allergies  Current Outpatient Prescriptions on File Prior to Visit  Medication Sig Dispense Refill  . aspirin (ASPIR-81) 81 MG EC tablet Take 1 tablet by mouth daily.    Marland Kitchen b complex vitamins tablet Take 1 tablet by mouth daily.    . Calcium Carbonate-Vitamin D (CALCIUM-VITAMIN D3 PO) Take by mouth daily.    . hydrochlorothiazide (MICROZIDE) 12.5 MG capsule TAKE 1 CAPSULE EVERY DAY 90 capsule 1  . methocarbamol (ROBAXIN) 500 MG tablet Take 1 tablet (500 mg total) by  mouth every 8 (eight) hours as needed for muscle spasms. 30 tablet 0  . omeprazole (PRILOSEC) 40 MG capsule TAKE 1 CAPSULE (40 MG TOTAL) BY MOUTH DAILY. 90 capsule 1  . ranitidine (ZANTAC) 150 MG tablet Take 1 tablet (150 mg total) by mouth at bedtime. 90 tablet 1  . simvastatin (ZOCOR) 40 MG tablet TAKE 1 TABLET EVERY EVENING 90 tablet 1  . tamsulosin (FLOMAX) 0.4 MG CAPS capsule Take 1 capsule (0.4 mg total) by mouth daily. 90 capsule 1  . timolol (TIMOPTIC) 0.5 % ophthalmic solution Place 1 drop into the left eye daily. 10 mL 12   No current facility-administered medications on file prior to visit.    BP 128/82 mmHg  Pulse 61  Temp(Src) 98.1 F (36.7 C) (Oral)  Resp 16  Ht 5' 5.25" (1.657 m)  Wt 183 lb (83.008 kg)  BMI 30.23 kg/m2  SpO2 98%       Objective:   Physical Exam  General- No acute distress. Pleasant patient. Neck- Full range of motion, no jvd Lungs- Clear, even and unlabored. Heart- regular rate and rhythm. Neurologic- CNII- XII grossly intact.   Back Mid lumbar spine tenderness to palpation. Pain on straight leg lift. Rt leg is worse. Pain on lateral movements and flexion/extension of the spine.  Lower ext neurologic  L5-S1 sensation intact bilaterally. Normal patellar reflexes bilaterally. No foot drop bilaterally.  Rt hip- pain on abduction and adduction. Pain on rotation. Groin pain on exam of hip. Genital exam- no hernia on either side.  Rt knee- stiff on rom. No obvious crepitus or pain.     Assessment & Plan:  Will get xray of lspine, rt hip and rt knee.(suspect will find degenerative changes as source of pain)  Rx diclofenac for pain and inflammation. Rx tramadol for moderate to severe pain.  Follow up in 2 wks or as needed.

## 2015-02-22 ENCOUNTER — Ambulatory Visit (INDEPENDENT_AMBULATORY_CARE_PROVIDER_SITE_OTHER): Payer: MEDICARE | Admitting: Medical

## 2015-02-22 ENCOUNTER — Encounter: Payer: Self-pay | Admitting: Medical

## 2015-02-22 VITALS — BP 116/80 | HR 68 | Temp 98.0°F | Ht 65.25 in | Wt 181.0 lb

## 2015-02-22 DIAGNOSIS — M25561 Pain in right knee: Secondary | ICD-10-CM | POA: Diagnosis not present

## 2015-02-22 DIAGNOSIS — M5441 Lumbago with sciatica, right side: Secondary | ICD-10-CM

## 2015-02-22 NOTE — Progress Notes (Signed)
Subjective:    Patient ID: Brian [name], male    DOB: Apr 21, 1946, 69 y.o.   MRN: [id number]  HPI   Pt in for back pain and rt knee pain. Pt back pain and knee pain is about the same.   Pt was given diclofenac and tramadol on last visit.  Back xray showed arthritic changes. Knee xray was normal.  Pt states with pain medication he had control of his back pain. But he does not like idea of being on pain medication. Pt only felt mild nausea. He not sure if was from diclofenac or tramadol. Since he took at same times.   Pt states pain is mild- moderate and tolerable in back. He does not want to take  meds.  Knee pain rt side has resolved.  Also on xray back showed below. But no signs or symptoms associated with these finding on review.  3. Rounded calcification in the right abdomen could represent gallstone or calcified node with renal calculus less likely. A calcified renal artery aneurysm would be a consideration as well.    Review of Systems  Constitutional: Negative for fever, chills and fatigue.  Respiratory: Negative for cough, chest tightness and wheezing.   Cardiovascular: Negative for chest pain and palpitations.  Gastrointestinal: Negative for nausea, vomiting, abdominal pain, diarrhea, constipation, abdominal distention and anal bleeding.  Genitourinary: Negative for dysuria, urgency, hematuria and flank pain.  Musculoskeletal: Positive for back pain.  Skin: Negative for rash.  Neurological: Negative for weakness and numbness.  Hematological: Negative for adenopathy. Does not bruise/bleed easily.     Past Medical History  Diagnosis Date  . Chicken pox   . GERD (gastroesophageal reflux disease)   . Measles   . [place] measles   . Mumps   . Hyperlipidemia   . Edema     Left Leg  . Essential hypertension, benign   . Blind right eye   . Heart attack   . Chronic back pain   . Dislocation, jaw 1960s  . Personal history of other diseases of digestive system  08.10.12    Gastric ulcer  . 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 Osteopenia 11.12.10    bone density test  . 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 Atypical chest pain      Negative cardiac catheterization 2014    Social History   Social History  . Marital Status: Single    Spouse Name: N/A  . Number of Children: N/A  . Years of Education: N/A   Occupational History  . Not on file.   Social History Main Topics  . Smoking status: Former Smoker -- 0.25 packs/day for 2 years    Types: Cigarettes    Quit date: 06/02/1966  . Smokeless tobacco: Never Used  . Alcohol Use: No  . Drug Use: No  . Sexual Activity: Not on file   Other Topics Concern  . Not on file   Social History Narrative    Past Surgical History  Procedure Laterality Date  . Appendectomy  1973  . Abdominal hernia repair  1972  . Eye surgery  1972    Bilateral Lens Replacement  . Mandible surgery  1968, 2008    Dislocated Jaw  . Wisdom tooth extraction    . Cataract extraction, bilateral  2005  . 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 Esophagogastroduodenoscopy  06.2011  . Facial reconstruction surgery  2008  . Left heart cath  02.17.2014    Family History  Problem Relation Age of Onset  . Alzheimer's disease Mother     Deceased  . Diabetes Maternal Grandmother   . Heart attack Maternal Grandmother   . Prostate cancer Maternal Uncle   . Diabetes Maternal Uncle   . Healthy Son   . Cataracts Maternal Aunt     No Known Allergies  Current Outpatient Prescriptions on File Prior to Visit  Medication Sig Dispense Refill  . aspirin (ASPIR-81) 81 MG EC tablet Take 1 tablet by mouth daily.    Marland Kitchen b complex vitamins tablet Take 1 tablet by mouth daily.    . Calcium Carbonate-Vitamin D (CALCIUM-VITAMIN D3 PO) Take by mouth daily.    . diclofenac  (VOLTAREN) 50 MG EC tablet Take 1 tablet (50 mg total) by mouth 2 (two) times daily. 30 tablet 0  . erythromycin ophthalmic ointment     . hydrochlorothiazide (MICROZIDE) 12.5 MG capsule TAKE 1 CAPSULE EVERY DAY 90 capsule 1  . methocarbamol (ROBAXIN) 500 MG tablet Take 1 tablet (500 mg total) by mouth every 8 (eight) hours as needed for muscle spasms. 30 tablet 0  . omeprazole (PRILOSEC) 40 MG capsule TAKE 1 CAPSULE (40 MG TOTAL) BY MOUTH DAILY. 90 capsule 1  . ranitidine (ZANTAC) 150 MG tablet Take 1 tablet (150 mg total) by mouth at bedtime. 90 tablet 1  . simvastatin (ZOCOR) 40 MG tablet TAKE 1 TABLET EVERY EVENING 90 tablet 1  . tamsulosin (FLOMAX) 0.4 MG CAPS capsule Take 1 capsule (0.4 mg total) by mouth daily. 90 capsule 1  . timolol (TIMOPTIC) 0.5 % ophthalmic solution Place 1 drop into the left eye daily. 10 mL 12  . traMADol (ULTRAM) 50 MG tablet Take 1 tablet (50 mg total) by mouth every 8 (eight) hours as needed. 30 tablet 0   No current facility-administered medications on file prior to visit.    BP 116/80 mmHg  Pulse 68  Temp(Src) 98 F (36.7 C) (Oral)  Ht 5' 5.25" (1.657 m)  Wt 181 lb (82.101 kg)  BMI 29.90 kg/m2  SpO2 97%       Objective:   Physical Exam  General- No acute distress. Pleasant patient. Neck- Full range of motion, no jvd Lungs- Clear, even and unlabored. Heart- regular rate and rhythm. Neurologic- CNII- XII grossly intact.  Back Mid lumbar spine faint  tenderness to palpation. Pain on straight leg lift faint today Pain on lateral movements and flexion/extension of the spine.  Lower ext neurologic  L5-S1 sensation intact bilaterally. Normal patellar reflexes bilaterally. No foot drop bilaterally.  Rt hip-  No pain on abduction and adduction. Pain on rotation. Groin pain on exam of hip. Genital exam- no hernia on either side.  Rt knee-  No stiffness on rom. No obvious crepitus or pain.  Abdomen Inspection:-Inspection Normal.    Palpation/Perucssion: Palpation and Percussion of the abdomen reveal- Non Tender, No Rebound tenderness, No rigidity(Guarding) and No Palpable abdominal masses.  Liver:-Normal.  Spleen:- Normal.         Assessment & Plan:  Your back pain is a lot better but still present. You do not want any  medication. I respect that but also want you to be aware that if you have periodic flares of moderate to severe pain I could make diclofenac + tramadol available again. If you were to take in future  I would take them seperatley to understand which one made you feel nausea.  I will further consider incidental finding on xray found. With your lack of any rt sides abdomen symptoms and negative exam I have not decided yet if further imaging studies indicated will let you know by phone.Follow up as regularly scheduled with pcp or with myself.

## 2015-02-22 NOTE — Progress Notes (Signed)
Pre visit review using our clinic review tool, if applicable. No additional management support is needed unless otherwise documented below in the visit note. 

## 2015-02-22 NOTE — Patient Instructions (Addendum)
Your back pain is a lot better but still present. You do not want any  medication. I respect that but also want you to be aware that if you have periodic flares of moderate to severe pain I could make diclofenac + tramadol available again. If you were to take in future I would take them seperatley to understand which one made you feel nausea.  I will further consider incidental finding on xray found. With your lack of any rt sides abdomen symptoms and negative exam I have not decided yet if further imaging studies indicated will let you know by phone.Follow up as regularly scheduled with pcp or with myself.

## 2015-03-07 ENCOUNTER — Encounter: Payer: Self-pay | Admitting: Medical

## 2015-03-07 ENCOUNTER — Ambulatory Visit (INDEPENDENT_AMBULATORY_CARE_PROVIDER_SITE_OTHER): Payer: MEDICARE | Admitting: Medical

## 2015-03-07 VITALS — BP 120/80 | HR 81 | Temp 97.4°F | Ht 65.25 in | Wt 185.4 lb

## 2015-03-07 DIAGNOSIS — M5441 Lumbago with sciatica, right side: Secondary | ICD-10-CM | POA: Diagnosis not present

## 2015-03-07 DIAGNOSIS — K2100 Gastro-esophageal reflux disease with esophagitis, without bleeding: Secondary | ICD-10-CM

## 2015-03-07 DIAGNOSIS — R350 Frequency of micturition: Secondary | ICD-10-CM

## 2015-03-07 DIAGNOSIS — R1010 Upper abdominal pain, unspecified: Secondary | ICD-10-CM

## 2015-03-07 DIAGNOSIS — M5410 Radiculopathy, site unspecified: Secondary | ICD-10-CM

## 2015-03-07 DIAGNOSIS — Z125 Encounter for screening for malignant neoplasm of prostate: Secondary | ICD-10-CM | POA: Diagnosis not present

## 2015-03-07 DIAGNOSIS — E785 Hyperlipidemia, unspecified: Secondary | ICD-10-CM

## 2015-03-07 LAB — CBC WITH DIFFERENTIAL/PLATELET
BASOS PCT: 1.3 % (ref 0.0–3.0)
Basophils Absolute: 0.1 10*3/uL (ref 0.0–0.1)
EOS ABS: 0.2 10*3/uL (ref 0.0–0.7)
EOS PCT: 4.3 % (ref 0.0–5.0)
HEMATOCRIT: 41.8 % (ref 39.0–52.0)
HEMOGLOBIN: 13.6 g/dL (ref 13.0–17.0)
LYMPHS PCT: 38 % (ref 12.0–46.0)
Lymphs Abs: 1.5 10*3/uL (ref 0.7–4.0)
MCHC: 32.5 g/dL (ref 30.0–36.0)
MCV: 85.8 fl (ref 78.0–100.0)
Monocytes Absolute: 0.5 10*3/uL (ref 0.1–1.0)
Monocytes Relative: 12.5 % — ABNORMAL HIGH (ref 3.0–12.0)
NEUTROS ABS: 1.7 10*3/uL (ref 1.4–7.7)
Neutrophils Relative %: 43.9 % (ref 43.0–77.0)
PLATELETS: 324 10*3/uL (ref 150.0–400.0)
RBC: 4.87 Mil/uL (ref 4.22–5.81)
RDW: 13.7 % (ref 11.5–15.5)
WBC: 4 10*3/uL (ref 4.0–10.5)

## 2015-03-07 LAB — PSA, MEDICARE: PSA: 0.65 ng/mL (ref 0.10–4.00)

## 2015-03-07 LAB — LIPASE: Lipase: 26 U/L (ref 11.0–59.0)

## 2015-03-07 LAB — COMPREHENSIVE METABOLIC PANEL
ALK PHOS: 63 U/L (ref 39–117)
ALT: 15 U/L (ref 0–53)
AST: 15 U/L (ref 0–37)
Albumin: 3.8 g/dL (ref 3.5–5.2)
BUN: 8 mg/dL (ref 6–23)
CO2: 29 meq/L (ref 19–32)
Calcium: 9.4 mg/dL (ref 8.4–10.5)
Chloride: 103 mEq/L (ref 96–112)
Creatinine, Ser: 1.11 mg/dL (ref 0.40–1.50)
GFR: 83.92 mL/min (ref >60.00)
GLUCOSE: 107 mg/dL — AB (ref 70–99)
POTASSIUM: 3.6 meq/L (ref 3.5–5.1)
SODIUM: 140 meq/L (ref 135–145)
TOTAL PROTEIN: 6.6 g/dL (ref 6.0–8.3)
Total Bilirubin: 0.9 mg/dL (ref 0.2–1.2)

## 2015-03-07 LAB — LIPID PANEL
CHOL/HDL RATIO: 4
Cholesterol: 213 mg/dL — ABNORMAL HIGH (ref 0–200)
HDL: 51.1 mg/dL (ref >39.00)
LDL Cholesterol: 127 mg/dL — ABNORMAL HIGH (ref 0–99)
NONHDL: 161.44
Triglycerides: 174 mg/dL — ABNORMAL HIGH (ref 0.0–149.0)
VLDL: 34.8 mg/dL (ref 0.0–40.0)

## 2015-03-07 LAB — AMYLASE: Amylase: 46 U/L (ref 27–131)

## 2015-03-07 MED ORDER — TRAMADOL HCL 50 MG PO TABS: 50.0000 mg | ORAL_TABLET | Freq: Four times a day (QID) | ORAL | Status: DC | PRN

## 2015-03-07 MED ORDER — SIMVASTATIN 40 MG PO TABS: 40.0000 mg | ORAL_TABLET | Freq: Every evening | ORAL | Status: DC

## 2015-03-07 MED ORDER — TAMSULOSIN HCL 0.4 MG PO CAPS: 0.4000 mg | ORAL_CAPSULE | Freq: Every day | ORAL | Status: DC

## 2015-03-07 MED ORDER — HYDROCHLOROTHIAZIDE 12.5 MG PO CAPS: 12.5000 mg | ORAL_CAPSULE | Freq: Every day | ORAL | Status: DC

## 2015-03-07 MED ORDER — OMEPRAZOLE 40 MG PO CPDR: 40.0000 mg | DELAYED_RELEASE_CAPSULE | Freq: Every day | ORAL | Status: DC

## 2015-03-07 MED ORDER — OMEPRAZOLE 40 MG PO CPDR: DELAYED_RELEASE_CAPSULE | ORAL | Status: DC

## 2015-03-07 MED ORDER — DICLOFENAC SODIUM 50 MG PO TBEC: 50.0000 mg | DELAYED_RELEASE_TABLET | Freq: Two times a day (BID) | ORAL | Status: DC

## 2015-03-07 NOTE — Patient Instructions (Addendum)
For your back pain with radiating features will try to order mri. Rx diclofenac and tramadol rx. Rx advisement given for both.  For abdomen pain/hx of reflux, use omeprazole. Will get cmp, amylase and lipase today. Refer to GI.  For frequent urination will get psa today. You may have mild bph and if symptom bothersome you could try flomax.   Hx of hyperlipidemia. Will get lipid panel today.  Follow up date to be determined after lab review.

## 2015-03-07 NOTE — Progress Notes (Signed)
Pre visit review using our clinic review tool, if applicable. No additional management support is needed unless otherwise documented below in the visit note. 

## 2015-03-07 NOTE — Progress Notes (Signed)
Subjective:    Patient ID: Brian [name], male    DOB: 1945-06-15, 68 y.o.   MRN: [id number]  HPI  Pt in states back pain is about the same. Pt level of pain is 6-7/10. Last time he did not want to take pain medications. But now he is willing to make. Pt is having pain from lower back to his rt groin area. On and off pain radiates to his rt leg throughout the day. Worse when sitting long periods. Pt states months with the shooting pain down his leg.  Pt states hx of gerd for years. Pt states has seen Gi at [facility]. Pt states had some polyps in throat or esophagus. Pt also states some GI procedures in [place].  Pt gets up at night urinating about 3 times on average. Sometimes difficulty initiating the flow. Then mentions after he uses bathroom will dripple and wet pants.  Pt needs refill of simvistatin. He is fasting.    Review of Systems  Constitutional: Negative for fever, chills, diaphoresis, activity change and fatigue.  Respiratory: Negative for cough, chest tightness and shortness of breath.   Cardiovascular: Negative for chest pain, palpitations and leg swelling.  Gastrointestinal: Positive for abdominal pain. Negative for nausea and vomiting.       Mild abdomen daily. Sore sensation distal esophagus per his report.  Genitourinary: Positive for frequency and difficulty urinating. Negative for dysuria, flank pain, decreased urine volume, discharge, penile swelling, scrotal swelling and testicular pain.  Musculoskeletal: Positive for back pain. Negative for neck pain and neck stiffness.  Skin: Negative for pallor.  Neurological: Negative for dizziness, tremors, seizures, syncope, facial asymmetry, speech difficulty, weakness, light-headedness, numbness and headaches.       Pain radiating to RLE from back.  Hematological: Negative for adenopathy. Does not bruise/bleed easily.  Psychiatric/Behavioral: Negative for behavioral problems, confusion and agitation. The patient is not  nervous/anxious.     Past Medical History  Diagnosis Date  . Chicken pox   . GERD (gastroesophageal reflux disease)   . Measles   . [place] measles   . Mumps   . Hyperlipidemia   . Edema     Left Leg  . Essential hypertension, benign   . Blind right eye   . Heart attack ([place])   . Chronic back pain   . Dislocation, jaw 1960s  . Personal history of other diseases of digestive system 08.10.12    Gastric ulcer  . 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 Osteopenia 11.12.10    bone density test  . 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 Atypical chest pain      Negative cardiac catheterization 2014    Social History   Social History  . Marital Status: Single    Spouse Name: N/A  . Number of Children: N/A  . Years of Education: N/A   Occupational History  . Not on file.   Social History Main Topics  . Smoking status: Former Smoker -- 0.25 packs/day for 2 years    Types: Cigarettes    Quit date: 06/02/1966  . Smokeless tobacco: Never Used  . Alcohol Use: No  . Drug Use: No  . Sexual Activity: Not on file   Other Topics Concern  . Not on file   Social History Narrative    Past Surgical History  Procedure Laterality Date  . Appendectomy  1973  . Abdominal hernia repair  1972  . Eye surgery  1972    Bilateral Lens Replacement  .  Mandible surgery  1968, 2008    Dislocated Jaw  . Wisdom tooth extraction    . Cataract extraction, bilateral  2005  . 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 Esophagogastroduodenoscopy  06.2011  . Facial reconstruction surgery  2008  . Left heart cath  02.17.2014    Family History  Problem Relation Age of Onset  . Alzheimer's disease Mother     Deceased  . Diabetes Maternal Grandmother   . Heart attack Maternal Grandmother   . Prostate cancer Maternal Uncle   . Diabetes Maternal  Uncle   . Healthy Son   . Cataracts Maternal Aunt     No Known Allergies  Current Outpatient Prescriptions on File Prior to Visit  Medication Sig Dispense Refill  . aspirin (ASPIR-81) 81 MG EC tablet Take 1 tablet by mouth daily.    Marland Kitchen b complex vitamins tablet Take 1 tablet by mouth daily.    . Calcium Carbonate-Vitamin D (CALCIUM-VITAMIN D3 PO) Take by mouth daily.    Marland Kitchen erythromycin ophthalmic ointment     . methocarbamol (ROBAXIN) 500 MG tablet Take 1 tablet (500 mg total) by mouth every 8 (eight) hours as needed for muscle spasms. 30 tablet 0  . ranitidine (ZANTAC) 150 MG tablet Take 1 tablet (150 mg total) by mouth at bedtime. 90 tablet 1  . tamsulosin (FLOMAX) 0.4 MG CAPS capsule Take 1 capsule (0.4 mg total) by mouth daily. 90 capsule 1  . timolol (TIMOPTIC) 0.5 % ophthalmic solution Place 1 drop into the left eye daily. 10 mL 12   No current facility-administered medications on file prior to visit.    BP 120/80 mmHg  Pulse 81  Temp(Src) 97.4 F (36.3 C) (Oral)  Ht 5' 5.25" (1.657 m)  Wt 185 lb 6.4 oz (84.097 kg)  BMI 30.63 kg/m2  SpO2 97%       Objective:   Physical Exam   General Appearance- Not in acute distress.    Chest and Lung Exam Auscultation: Breath sounds:-Normal. Clear even and unlabored. Adventitious sounds:- No Adventitious sounds.  Cardiovascular Auscultation:Rythm - Regular, rate and rythm. Heart Sounds -Normal heart sounds.  Abdomen Inspection:-Inspection Normal.  Palpation/Perucssion: Palpation and Percussion of the abdomen reveal- Non Tender, No Rebound tenderness, No rigidity(Guarding) and No Palpable abdominal masses.  Liver:-Normal.  Spleen:- Normal.     Rectal Anorectal Exam: Stool - Hemoccult of stool/mucous is Heme Negative. External - normal external exam. Internal - normal sphincter tone. No rectal mass. Prostate felt mild minimal enlarged. Smooth. No nodules felt.  Genital exam- no herniations on palpation inguinal  canals.    Back Mid lumbar spine tenderness to palpation. Pain on straight leg lift. Pain on lateral movements and flexion/extension of the spine.  Lower ext neurologic  L5-S1 sensation intact bilaterally. Normal patellar reflexes bilaterally. No foot drop bilaterally.   Neurologic Cranial Nerve exam:- CN III-XII intact(No nystagmus), symmetric smile. Strength:- 5/5 equal and symmetric strength both upper and lower extremities.       Assessment & Plan:  For your back pain with radiating features will try to order mri. Rx diclofenac and tramadol rx. Rx advisement given for both.  For abdomen pain/hx of reflux, use omeprazole. Will get cmp, amylase and lipase today. Refer to GI.  For frequent urination will get psa today. You may have mild  bph and if symptom bothersome you could try flomax.   Hx of hyperlipidemia. Will get lipid panel today.  Refilled meds today as well.

## 2015-03-09 ENCOUNTER — Telehealth: Payer: Self-pay | Admitting: Physician Assistant

## 2015-03-09 ENCOUNTER — Telehealth: Payer: Self-pay | Admitting: Medical

## 2015-03-09 MED ORDER — EZETIMIBE 10 MG PO TABS: 10.0000 mg | ORAL_TABLET | Freq: Every day | ORAL | Status: DC

## 2015-03-09 NOTE — Telephone Encounter (Signed)
Spoke with pt and he voices understanding.  

## 2015-03-09 NOTE — Telephone Encounter (Signed)
Pt needs Zetia sent to [facility], fax# provided to him was [phone]. He said that it was too expensive at [facility].

## 2015-03-09 NOTE — Telephone Encounter (Signed)
Per ES verbal ok to send to [facility]. Pt was notified and he voices understanding. Pt will call pharmacy and cancel medication.

## 2015-03-09 NOTE — Telephone Encounter (Signed)
Rx zetia sent to local pharmacy. New med and yet to be determined if will continue in future. If works then in 3 months will send to mail order pharmacy.

## 2015-03-10 ENCOUNTER — Ambulatory Visit (HOSPITAL_BASED_OUTPATIENT_CLINIC_OR_DEPARTMENT_OTHER): Payer: MEDICARE

## 2015-03-13 ENCOUNTER — Ambulatory Visit: Payer: MEDICARE | Admitting: Physician Assistant

## 2015-03-17 ENCOUNTER — Ambulatory Visit (HOSPITAL_BASED_OUTPATIENT_CLINIC_OR_DEPARTMENT_OTHER)
Admission: RE | Admit: 2015-03-17 | Discharge: 2015-03-17 | Disposition: A | Discharge status: Home / Self Care | Payer: MEDICARE | Source: Ambulatory Visit | Attending: Medical | Admitting: Medical

## 2015-03-17 DIAGNOSIS — M4316 Spondylolisthesis, lumbar region: Secondary | ICD-10-CM | POA: Insufficient documentation

## 2015-03-17 DIAGNOSIS — M545 Low back pain: Secondary | ICD-10-CM | POA: Insufficient documentation

## 2015-03-17 DIAGNOSIS — M5441 Lumbago with sciatica, right side: Secondary | ICD-10-CM

## 2015-03-17 DIAGNOSIS — M5410 Radiculopathy, site unspecified: Secondary | ICD-10-CM

## 2015-03-17 DIAGNOSIS — G8929 Other chronic pain: Secondary | ICD-10-CM | POA: Diagnosis not present

## 2015-03-17 DIAGNOSIS — M4806 Spinal stenosis, lumbar region: Secondary | ICD-10-CM | POA: Diagnosis not present

## 2015-03-17 DIAGNOSIS — M47896 Other spondylosis, lumbar region: Secondary | ICD-10-CM | POA: Diagnosis not present

## 2015-03-19 NOTE — Addendum Note (Signed)
Addended by: Tasia Catchings on: 03/19/2015 08:44 AM   Modules accepted: Orders

## 2015-04-20 ENCOUNTER — Encounter: Payer: Self-pay | Admitting: Medical

## 2015-04-22 ENCOUNTER — Telehealth: Payer: Self-pay | Admitting: Medical

## 2015-04-22 NOTE — Telephone Encounter (Signed)
I referred pt to neurosurgeon but it looks like he was not referred?

## 2015-05-22 ENCOUNTER — Telehealth: Payer: Self-pay | Admitting: Physician Assistant

## 2015-05-22 NOTE — Telephone Encounter (Signed)
Pt says that he will call us when he want to schedule his flu vac. He is declining for now.

## 2015-05-22 NOTE — Telephone Encounter (Signed)
Noted in HM. 

## 2015-06-06 ENCOUNTER — Encounter: Payer: Self-pay | Admitting: Medical

## 2015-06-06 ENCOUNTER — Ambulatory Visit (INDEPENDENT_AMBULATORY_CARE_PROVIDER_SITE_OTHER): Payer: MEDICARE | Admitting: Medical

## 2015-06-06 VITALS — BP 116/80 | HR 58 | Temp 97.8°F | Ht 65.25 in | Wt 186.8 lb

## 2015-06-06 DIAGNOSIS — E785 Hyperlipidemia, unspecified: Secondary | ICD-10-CM

## 2015-06-06 DIAGNOSIS — L739 Follicular disorder, unspecified: Secondary | ICD-10-CM

## 2015-06-06 DIAGNOSIS — K2100 Gastro-esophageal reflux disease with esophagitis, without bleeding: Secondary | ICD-10-CM

## 2015-06-06 DIAGNOSIS — Z23 Encounter for immunization: Secondary | ICD-10-CM

## 2015-06-06 DIAGNOSIS — R32 Unspecified urinary incontinence: Secondary | ICD-10-CM

## 2015-06-06 DIAGNOSIS — N3943 Post-void dribbling: Secondary | ICD-10-CM

## 2015-06-06 MED ORDER — DOXYCYCLINE HYCLATE 100 MG PO TABS: 100.0000 mg | ORAL_TABLET | Freq: Two times a day (BID) | ORAL | Status: DC

## 2015-06-06 NOTE — Progress Notes (Signed)
Pre visit review using our clinic review tool, if applicable. No additional management support is needed unless otherwise documented below in the visit note. 

## 2015-06-06 NOTE — Patient Instructions (Addendum)
Regarding your reflux history, I think benefits of omeprazole outweigh any risk. I would recommend continue omeprazole and try to get old egd records since will need repeat in at least one year. If you can get [place] name of MD and facility where you got both egd and colonosocpy done last then will make the referral locally.  For your lipids we have discussed that you will continue your statin and follow strict diet. Then repeat lipid panel fasting in 3 months. If lipids still high then consider 3 month trial of zetia in addition to statin.  For urinary leak issues will refer you to urologist.  For folliculitis rx of doxycycline for 7 days.  Please try to find someone who could give you a ride back specialist in [facility]. Then I will refer you to one in [facility].  Follow up in 3 months or as needed

## 2015-06-06 NOTE — Progress Notes (Signed)
Subjective:    Patient ID: Brian [name], male    DOB: 11-Dec-1945, 70 y.o.   MRN: [id number]  HPI  Pt in for follow up. Pt needs refill of omeprazole. Pt states has some questions on omeprazole. He states that he still has some symptoms of reflux. Bitterness type last at times when he awakes in the morning(belches at times). Pt has been on various meds for his stomach in the past. Pt states prior egd about 8-9 years ago. He thinks colonosocpy 8-9 years ago. Pt states both studies were negative.  Pt also states 4 days of some mild red bumps on back of his neck. Faint  tender  Pt has hyperlipidemia. I had asked him to start zetia. But he complains of cost. Pt admits to not following good diet. He states in past he has responded to strict diet.   Pt still has some urinary problems. He states he will leak urine at times after urinating. This happens about 3 times a week. He waits very long time before having to urinate. This may contribute to his accidents post urination. These symptoms despite being on flomax.  I referred pt to specialist for his back pain. But [place] Due to [place] issues.     Review of Systems  Constitutional: Negative for fever, chills, diaphoresis, activity change and fatigue.  Respiratory: Negative for cough, chest tightness and shortness of breath.   Cardiovascular: Negative for chest pain, palpitations and leg swelling.  Gastrointestinal: Positive for abdominal pain. Negative for nausea and vomiting.       See hpi  Musculoskeletal: Positive for back pain. Negative for neck pain and neck stiffness.       Hx of  Skin:       See hpi  Neurological: Negative for dizziness, tremors, syncope, facial asymmetry and weakness.  Psychiatric/Behavioral: Negative for behavioral problems, confusion and agitation. The patient is not nervous/anxious.     Past Medical History  Diagnosis Date  . Chicken pox   . GERD (gastroesophageal reflux  disease)   . Measles   . [place] measles   . Mumps   . Hyperlipidemia   . Edema     Left Leg  . Essential hypertension, benign   . Blind right eye   . Heart attack ([place])   . Chronic back pain   . Dislocation, jaw 1960s  . Personal history of other diseases of digestive system 08.10.12    Gastric ulcer  . 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 Osteopenia 11.12.10    bone density test  . 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 Atypical chest pain      Negative cardiac catheterization 2014    Social History   Social History  . Marital Status: Single    Spouse Name: N/A  . Number of Children: N/A  . Years of Education: N/A   Occupational History  . Not on file.   Social History Main Topics  . Smoking status: Former Smoker -- 0.25 packs/day for 2 years    Types: Cigarettes    Quit date: 06/02/1966  . Smokeless tobacco: Never Used  . Alcohol Use: No  . Drug Use: No  . Sexual Activity: Not on file   Other Topics Concern  . Not on file   Social History Narrative    Past Surgical History  Procedure Laterality Date  . Appendectomy  1973  . Abdominal hernia repair  1972  . Eye surgery  [phone]  Bilateral Lens Replacement  . Mandible surgery  1968, 2008    Dislocated Jaw  . Wisdom tooth extraction    . Cataract extraction, bilateral  2005  . 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 Esophagogastroduodenoscopy  06.2011  . Facial reconstruction surgery  2008  . Left heart cath  02.17.2014    Family History  Problem Relation Age of Onset  . Alzheimer's disease Mother     Deceased  . Diabetes Maternal Grandmother   . Heart attack Maternal Grandmother   . Prostate cancer Maternal Uncle   . Diabetes Maternal Uncle   . Healthy Son   . Cataracts Maternal Aunt     No Known Allergies  Current Outpatient Prescriptions  on File Prior to Visit  Medication Sig Dispense Refill  . aspirin (ASPIR-81) 81 MG EC tablet Take 1 tablet by mouth daily.    Marland Kitchen b complex vitamins tablet Take 1 tablet by mouth daily.    . Calcium Carbonate-Vitamin D (CALCIUM-VITAMIN D3 PO) Take by mouth daily.    . diclofenac (VOLTAREN) 50 MG EC tablet Take 1 tablet (50 mg total) by mouth 2 (two) times daily. 60 tablet 0  . erythromycin ophthalmic ointment     . ezetimibe (ZETIA) 10 MG tablet Take 1 tablet (10 mg total) by mouth daily. 30 tablet 2  . hydrochlorothiazide (MICROZIDE) 12.5 MG capsule Take 1 capsule (12.5 mg total) by mouth daily. 90 capsule 1  . methocarbamol (ROBAXIN) 500 MG tablet Take 1 tablet (500 mg total) by mouth every 8 (eight) hours as needed for muscle spasms. 30 tablet 0  . omeprazole (PRILOSEC) 40 MG capsule Take 1 capsule (40 mg total) by mouth daily. 90 capsule 1  . omeprazole (PRILOSEC) 40 MG capsule TAKE 1 CAPSULE (40 MG TOTAL) BY MOUTH DAILY. 90 capsule 1  . ranitidine (ZANTAC) 150 MG tablet Take 1 tablet (150 mg total) by mouth at bedtime. 90 tablet 1  . simvastatin (ZOCOR) 40 MG tablet Take 1 tablet (40 mg total) by mouth every evening. 90 tablet 1  . tamsulosin (FLOMAX) 0.4 MG CAPS capsule Take 1 capsule (0.4 mg total) by mouth daily. 90 capsule 1  . tamsulosin (FLOMAX) 0.4 MG CAPS capsule Take 1 capsule (0.4 mg total) by mouth daily. 90 capsule 1  . timolol (TIMOPTIC) 0.5 % ophthalmic solution Place 1 drop into the left eye daily. 10 mL 12  . traMADol (ULTRAM) 50 MG tablet Take 1 tablet (50 mg total) by mouth every 6 (six) hours as needed for severe pain. 45 tablet 0   No current facility-administered medications on file prior to visit.    BP 116/80 mmHg  Pulse 58  Temp(Src) 97.8 F (36.6 C) (Oral)  Ht 5' 5.25" (1.657 m)  Wt 186 lb 12.8 oz (84.732 kg)  BMI 30.86 kg/m2  SpO2 95%       Objective:   Physical Exam  General Mental Status- Alert. General Appearance- Not in acute distress.    Skin back of neck 3-4 moderate sized inflammed follicles.  Neck Carotid Arteries- Normal color. Moisture- Normal Moisture. No carotid bruits. No JVD.  Chest and Lung Exam Auscultation: Breath Sounds:-Normal.  Cardiovascular Auscultation:Rythm- Regular. Murmurs & Other Heart Sounds:Auscultation of the heart reveals- No Murmurs.  Abdomen Inspection:-Inspeection Normal. Palpation/Percussion:Note:No mass. Palpation and Percussion of the abdomen  reveal- Non Tender, Non Distended + BS, no rebound or guarding.    Neurologic Cranial Nerve exam:- CN III-XII intact(No nystagmus), symmetric smile. Strength:- 5/5 equal and symmetric strength both upper and lower extremities.        Assessment & Plan:  Regarding your reflux history, I think benefits of omeprazole outweigh any risk. I would recommend continue omeprazole and try to get old egd records since will need repeat in at least one year. If you can get [place] name of MD and facility where you got both egd and colonosocpy done last then will make the referral locally.  For your lipids we have discussed that you will continue your statin and follow strict diet. Then repeat lipid panel fasting in 3 months. If lipids still high then consider 3 month trial of zetia in addition to statin.  For urinary leak issues will refer you to urologist.  For folliculitis rx of doxycycline for 7 days.  Please try to find someone who could give you a ride back specialist in [facility]. Then I will refer you to one in [facility]. Advised to continue diclofenac. Can try 2 tab of tramadol for severe pain.  Follow up in 3 months or as needed

## 2015-08-02 ENCOUNTER — Telehealth: Payer: Self-pay | Admitting: Medical

## 2015-08-02 ENCOUNTER — Ambulatory Visit (HOSPITAL_BASED_OUTPATIENT_CLINIC_OR_DEPARTMENT_OTHER)
Admission: RE | Admit: 2015-08-02 | Discharge: 2015-08-02 | Disposition: A | Discharge status: Home / Self Care | Payer: MEDICARE | Source: Ambulatory Visit | Attending: Medical | Admitting: Medical

## 2015-08-02 ENCOUNTER — Encounter: Payer: Self-pay | Admitting: Medical

## 2015-08-02 ENCOUNTER — Ambulatory Visit (HOSPITAL_BASED_OUTPATIENT_CLINIC_OR_DEPARTMENT_OTHER): Payer: MEDICARE

## 2015-08-02 ENCOUNTER — Ambulatory Visit (INDEPENDENT_AMBULATORY_CARE_PROVIDER_SITE_OTHER): Payer: MEDICARE | Admitting: Medical

## 2015-08-02 VITALS — BP 120/80 | HR 78 | Temp 97.8°F | Ht 65.25 in | Wt 191.4 lb

## 2015-08-02 DIAGNOSIS — I517 Cardiomegaly: Secondary | ICD-10-CM | POA: Insufficient documentation

## 2015-08-02 DIAGNOSIS — G319 Degenerative disease of nervous system, unspecified: Secondary | ICD-10-CM | POA: Diagnosis not present

## 2015-08-02 DIAGNOSIS — R29898 Other symptoms and signs involving the musculoskeletal system: Secondary | ICD-10-CM

## 2015-08-02 DIAGNOSIS — R2689 Other abnormalities of gait and mobility: Secondary | ICD-10-CM

## 2015-08-02 DIAGNOSIS — R0600 Dyspnea, unspecified: Secondary | ICD-10-CM

## 2015-08-02 DIAGNOSIS — R29818 Other symptoms and signs involving the nervous system: Secondary | ICD-10-CM | POA: Insufficient documentation

## 2015-08-02 DIAGNOSIS — M62838 Other muscle spasm: Secondary | ICD-10-CM

## 2015-08-02 DIAGNOSIS — M6248 Contracture of muscle, other site: Secondary | ICD-10-CM

## 2015-08-02 DIAGNOSIS — M5441 Lumbago with sciatica, right side: Secondary | ICD-10-CM | POA: Diagnosis not present

## 2015-08-02 LAB — BRAIN NATRIURETIC PEPTIDE: PRO B NATRI PEPTIDE: 36 pg/mL (ref 0.0–100.0)

## 2015-08-02 MED ORDER — ALBUTEROL SULFATE HFA 108 (90 BASE) MCG/ACT IN AERS: 2.0000 | INHALATION_SPRAY | Freq: Four times a day (QID) | RESPIRATORY_TRACT | Status: DC | PRN

## 2015-08-02 MED ORDER — DICLOFENAC SODIUM 50 MG PO TBEC: 50.0000 mg | DELAYED_RELEASE_TABLET | Freq: Two times a day (BID) | ORAL | Status: DC

## 2015-08-02 MED ORDER — KETOROLAC TROMETHAMINE 60 MG/2ML IM SOLN
60.0000 mg | Freq: Once | INTRAMUSCULAR | Status: AC
  Administered 2015-08-02: 60 mg via INTRAMUSCULAR

## 2015-08-02 MED ORDER — TIZANIDINE HCL 4 MG PO CAPS
ORAL_CAPSULE | ORAL | Status: DC
Start: 2015-08-02 — End: 2015-09-04

## 2015-08-02 MED ORDER — TRAMADOL HCL 50 MG PO TABS: 50.0000 mg | ORAL_TABLET | Freq: Four times a day (QID) | ORAL | Status: DC | PRN

## 2015-08-02 NOTE — Progress Notes (Signed)
Subjective:    Patient ID: Brian [name], male    DOB: 04/10/1946, 70 y.o.   MRN: [id number]  HPI   Pt in states he was putting some clothes in chest drawer(lost balance) and fell 2 days ago Denies any other falls. He landed on carpet. Pt has some pain in lower back. Since fall he feels like lower back has knot and feels stiff.   Pt has history of back pain. I last saw for this in October. Before the most recent fall had pain level of 3-4/10. Since fall pain is 6-7/10. But pain is intermittent most on changing positions. No radicular pain.  Pt states before the fall he felt mild off balance and swaying. Since fall pt has mild ha. Faint ha. No throbbing. No nausea, vomiting. No gross motor or sensory deficits. No vision changes.   At very end pt reporting on climbing stairs some sob. No wheezing. No chest pain. Rare left pedal edema. No popliteal pain. Hx of smoking. He stopped smoking in 66. He smoked for 2 years.   Review of Systems  Constitutional: Negative for fever, chills and fatigue.  Respiratory: Positive for shortness of breath. Negative for cough, chest tightness and stridor.   Cardiovascular: Negative for chest pain and palpitations.  Musculoskeletal: Positive for back pain.  Neurological: Positive for dizziness. Negative for syncope, facial asymmetry, speech difficulty, weakness, light-headedness and headaches.       Mild off balance feeling. Fall the other day.   Slight weak rt thumb and index finger rt side for 2 weeks.  Hematological: Negative for adenopathy. Does not bruise/bleed easily.  Psychiatric/Behavioral: Negative for behavioral problems and confusion.    Past Medical History  Diagnosis Date  . Chicken pox   . GERD (gastroesophageal reflux disease)   . Measles   . [place] measles   . Mumps   . Hyperlipidemia   . Edema     Left Leg  . Essential hypertension, benign   . Blind right eye   . Heart attack ([place])   . Chronic back pain   . Dislocation, jaw  1960s  . Personal history of other diseases of digestive system 08.10.12    Gastric ulcer  . 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 Osteopenia 11.12.10    bone density test  . 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 Atypical chest pain      Negative cardiac catheterization 2014    Social History   Social History  . Marital Status: Single    Spouse Name: N/A  . Number of Children: N/A  . Years of Education: N/A   Occupational History  . Not on file.   Social History Main Topics  . Smoking status: Former Smoker -- 0.25 packs/day for 2 years    Types: Cigarettes    Quit date: 06/02/1966  . Smokeless tobacco: Never Used  . Alcohol Use: No  . Drug Use: No  . Sexual Activity: Not on file   Other Topics Concern  . Not on file   Social History Narrative    Past Surgical History  Procedure Laterality Date  . Appendectomy  1973  . Abdominal hernia repair  1972  . Eye surgery  1972    Bilateral Lens Replacement  . Mandible surgery  1968, 2008    Dislocated Jaw  . Wisdom tooth extraction    . Cataract extraction, bilateral  2005  . 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 Esophagogastroduodenoscopy  06.2011  . Facial reconstruction surgery  2008  . Left heart cath  02.17.2014    Family History  Problem Relation Age of Onset  . Alzheimer's disease Mother     Deceased  . Diabetes Maternal Grandmother   . Heart attack Maternal Grandmother   . Prostate cancer Maternal Uncle   . Diabetes Maternal Uncle   . Healthy Son   . Cataracts Maternal Aunt     No Known Allergies  Current Outpatient Prescriptions on File Prior to Visit  Medication Sig Dispense Refill  . aspirin (ASPIR-81) 81 MG EC tablet Take 1 tablet by mouth daily.    Marland Kitchen b complex vitamins tablet Take 1 tablet by mouth daily.    . Calcium Carbonate-Vitamin D  (CALCIUM-VITAMIN D3 PO) Take by mouth daily.    . diclofenac (VOLTAREN) 50 MG EC tablet Take 1 tablet (50 mg total) by mouth 2 (two) times daily. 60 tablet 0  . doxycycline (VIBRA-TABS) 100 MG tablet Take 1 tablet (100 mg total) by mouth 2 (two) times daily. 14 tablet 0  . erythromycin ophthalmic ointment     . ezetimibe (ZETIA) 10 MG tablet Take 1 tablet (10 mg total) by mouth daily. 30 tablet 2  . hydrochlorothiazide (MICROZIDE) 12.5 MG capsule Take 1 capsule (12.5 mg total) by mouth daily. 90 capsule 1  . methocarbamol (ROBAXIN) 500 MG tablet Take 1 tablet (500 mg total) by mouth every 8 (eight) hours as needed for muscle spasms. 30 tablet 0  . omeprazole (PRILOSEC) 40 MG capsule Take 1 capsule (40 mg total) by mouth daily. 90 capsule 1  . omeprazole (PRILOSEC) 40 MG capsule TAKE 1 CAPSULE (40 MG TOTAL) BY MOUTH DAILY. 90 capsule 1  . ranitidine (ZANTAC) 150 MG tablet Take 1 tablet (150 mg total) by mouth at bedtime. 90 tablet 1  . simvastatin (ZOCOR) 40 MG tablet Take 1 tablet (40 mg total) by mouth every evening. 90 tablet 1  . tamsulosin (FLOMAX) 0.4 MG CAPS capsule Take 1 capsule (0.4 mg total) by mouth daily. 90 capsule 1  . tamsulosin (FLOMAX) 0.4 MG CAPS capsule Take 1 capsule (0.4 mg total) by mouth daily. 90 capsule 1  . timolol (TIMOPTIC) 0.5 % ophthalmic solution Place 1 drop into the left eye daily. 10 mL 12  . traMADol (ULTRAM) 50 MG tablet Take 1 tablet (50 mg total) by mouth every 6 (six) hours as needed for severe pain. 45 tablet 0   No current facility-administered medications on file prior to visit.    BP 120/80 mmHg  Pulse 78  Temp(Src) 97.8 F (36.6 C) (Oral)  Ht 5' 5.25" (1.657 m)  Wt 191 lb 6.4 oz (86.818 kg)  BMI 31.62 kg/m2  SpO2 96%       Objective:   Physical Exam  General Appearance- Not in acute distress.    Chest and Lung Exam Auscultation: Breath sounds:-Normal. Clear even and unlabored. Adventitious sounds:- No Adventitious  sounds.  Cardiovascular Auscultation:Rythm - Regular, rate and rythm. Heart Sounds -Normal heart sounds.  Abdomen Inspection:-Inspection Normal.  Palpation/Perucssion: Palpation and Percussion of the abdomen reveal- Non Tender, No Rebound tenderness, No rigidity(Guarding) and No Palpable abdominal masses.  Liver:-Normal.  Spleen:- Normal.   Back Mid lumbar spine tenderness to palpation. Pain on straight leg lift. Pain on lateral movements and flexion/extension of the spine.  Lower ext neurologic  L5-S1 sensation intact bilaterally. Normal patellar reflexes bilaterally. No foot drop bilaterally.   Neurologic Cranial Nerve exam:- CN III-XII intact(No nystagmus), symmetric smile. Romberg Exam:- Negative.  Finger to Nose:- Normal/Intact Strength:- 5/5 equal and symmetric strength both upper and lower extremities. Rt hand thumb and second digit mild week on grip strength eval.  Lower ext- no pedal edema presently. Negative homans signs.      Assessment & Plan:  Bradycardia on ekg. But walking down hall we checked his pulse and his pulse did respond climbed to 80 range.  For back pain toradol 60 mg im. Rx diclofenac, zanaflex and toradol.  For recent balance problems for 2 weeks and new faint rt upper ext hand grip weakness will try to get ct of head without contrast.  For dyspnea we got ekg, cxr and bnp. Will make albuterol inhaler available. Can use as trial basis and see if this improves intermittent dyspnea(this may be worthwhile since you have remote history of smoking)  Follow up in 10-14 days or as needed.

## 2015-08-02 NOTE — Telephone Encounter (Signed)
Per referral notes from [facility], they have refused this patient. Please advise

## 2015-08-02 NOTE — Telephone Encounter (Signed)
In October pt referred to Gi but gi could not proceed with studies since they did not have records. Pt has been to Brian Hearing MD [place]. Phone number is [phone]. We need and GI needs his old records. Old studies done.   Will you get front end staff to get him to sign release of info form.    Address [address] [place].   Or give info to [facility]. Pt has seen them but delay in getting work up since old studies were not available.

## 2015-08-02 NOTE — Patient Instructions (Addendum)
For back pain toradol 60 mg im. Rx diclofenac, zanaflex and toradol.  For recent balance problems for 2 weeks and new faint rt upper ext hand grip weakness will try to get ct of head without contrast.  For dyspnea we got ekg, cxr and bnp. Will make albuterol inhaler available. Can use as trial basis and see if this improves intermittent dyspnea(this may be worthwhile since you have remote history of smoking)  Follow up in 10-14 days or as needed.

## 2015-08-02 NOTE — Progress Notes (Signed)
Pre visit review using our clinic review tool, if applicable. No additional management support is needed unless otherwise documented below in the visit note. 

## 2015-08-03 NOTE — Telephone Encounter (Signed)
Will you try to get old records. Can he be referred elsewhere same reason/dx as before.

## 2015-08-03 NOTE — Telephone Encounter (Signed)
Contacted Dr [facility] office, spoke with Verdis Frederickson, she will spend notes. They maybe in storage since the patient has not been seen since 2011. Awaiting records

## 2015-08-03 NOTE — Telephone Encounter (Signed)
No reason given, records were received from prior [facility]

## 2015-08-03 NOTE — Telephone Encounter (Signed)
Why did they refuse. If we get records will they see him?

## 2015-08-28 ENCOUNTER — Telehealth: Payer: Self-pay | Admitting: Physician Assistant

## 2015-08-28 NOTE — Telephone Encounter (Signed)
Updated chart.

## 2015-08-28 NOTE — Telephone Encounter (Signed)
Would you run it by [facility] if ok to switch. [place] with me. But send him message. If he states ok then will you switch.

## 2015-08-28 NOTE — Telephone Encounter (Signed)
Fine with me

## 2015-08-28 NOTE — Telephone Encounter (Signed)
Pt called in requesting appt with Brian [name] for back pain Thursday or Friday. Advised pt Brian [name] is not his primary and is out of office Thursday and Friday. Pt declined scheduling with [facility] and asked that we change PCP to [facility]. Pt is scheduled with Brian [name] 09/04/15 at his request. Please advise if ok to change provider

## 2015-09-04 ENCOUNTER — Ambulatory Visit (HOSPITAL_BASED_OUTPATIENT_CLINIC_OR_DEPARTMENT_OTHER)
Admission: RE | Admit: 2015-09-04 | Discharge: 2015-09-04 | Disposition: A | Discharge status: Home / Self Care | Payer: MEDICARE | Source: Ambulatory Visit | Attending: Medical | Admitting: Medical

## 2015-09-04 ENCOUNTER — Encounter: Payer: Self-pay | Admitting: Medical

## 2015-09-04 ENCOUNTER — Ambulatory Visit (INDEPENDENT_AMBULATORY_CARE_PROVIDER_SITE_OTHER): Payer: MEDICARE | Admitting: Medical

## 2015-09-04 VITALS — BP 124/84 | HR 77 | Temp 98.2°F | Ht 65.0 in | Wt 187.8 lb

## 2015-09-04 DIAGNOSIS — M542 Cervicalgia: Secondary | ICD-10-CM | POA: Diagnosis not present

## 2015-09-04 DIAGNOSIS — M4802 Spinal stenosis, cervical region: Secondary | ICD-10-CM | POA: Diagnosis not present

## 2015-09-04 DIAGNOSIS — M79622 Pain in left upper arm: Secondary | ICD-10-CM | POA: Insufficient documentation

## 2015-09-04 DIAGNOSIS — M546 Pain in thoracic spine: Secondary | ICD-10-CM | POA: Insufficient documentation

## 2015-09-04 DIAGNOSIS — M25522 Pain in left elbow: Secondary | ICD-10-CM

## 2015-09-04 DIAGNOSIS — M5031 Other cervical disc degeneration,  high cervical region: Secondary | ICD-10-CM | POA: Insufficient documentation

## 2015-09-04 DIAGNOSIS — M5134 Other intervertebral disc degeneration, thoracic region: Secondary | ICD-10-CM | POA: Diagnosis not present

## 2015-09-04 DIAGNOSIS — M4184 Other forms of scoliosis, thoracic region: Secondary | ICD-10-CM | POA: Diagnosis not present

## 2015-09-04 DIAGNOSIS — M5442 Lumbago with sciatica, left side: Secondary | ICD-10-CM | POA: Diagnosis not present

## 2015-09-04 MED ORDER — RANITIDINE HCL 150 MG PO CAPS: 150.0000 mg | ORAL_CAPSULE | Freq: Two times a day (BID) | ORAL | Status: DC

## 2015-09-04 MED ORDER — CELECOXIB 200 MG PO CAPS: 200.0000 mg | ORAL_CAPSULE | Freq: Every day | ORAL | Status: DC

## 2015-09-04 MED ORDER — KETOROLAC TROMETHAMINE 60 MG/2ML IM SOLN
60.0000 mg | Freq: Once | INTRAMUSCULAR | Status: AC
  Administered 2015-09-04: 60 mg via INTRAMUSCULAR

## 2015-09-04 NOTE — Progress Notes (Signed)
Pre visit review using our clinic review tool, if applicable. No additional management support is needed unless otherwise documented below in the visit note. 

## 2015-09-04 NOTE — Progress Notes (Signed)
Subjective:    Patient ID: Brian [name], male    DOB: Jul 31, 1945, 70 y.o.   MRN: [id number]  HPI   Pt in with some persisting back pain(hx of pain in the past). Pain is in lower back pain. Some pain radiating down his legs a week ago. Happens intermittently This radiating pain is new. In past was just back pain. Pt has some budging side and some mild stenosis on prior mri. No numbness to legs. No saddle anesthesia and no leg weakness. No incontinence.  Also some neck pain for about one week. He also some left trapezius pain and left humerus area pain. He does not describe radicular pain but notices association with pain. No radiating pain to his arms.   Pt has some bitter taste in mouth and when lays on rt side will get reflux. He wonders if this could be reflux.    Review of Systems  Constitutional: Negative for fever, chills and fatigue.  Respiratory: Negative for chest tightness, shortness of breath and wheezing.   Cardiovascular: Negative for chest pain and palpitations.  Gastrointestinal:       Bitter taste in mouth and some reflux lying on right side.  Musculoskeletal: Positive for back pain and neck pain.  Skin: Negative for rash.  Neurological: Negative for dizziness and light-headedness.  Hematological: Negative for adenopathy. Does not bruise/bleed easily.  Psychiatric/Behavioral: Negative for behavioral problems and confusion.    Past Medical History  Diagnosis Date  . Chicken pox   . GERD (gastroesophageal reflux disease)   . Measles   . [place] measles   . Mumps   . Hyperlipidemia   . Edema     Left Leg  . Essential hypertension, benign   . Blind right eye   . Heart attack ([place])   . Chronic back pain   . Dislocation, jaw 1960s  . Personal history of other diseases of digestive system 08.10.12    Gastric ulcer  . 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 Osteopenia 11.12.10    bone density test  . 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  Atypical chest pain      Negative cardiac catheterization 2014    Social History   Social History  . Marital Status: Single    Spouse Name: N/A  . Number of Children: N/A  . Years of Education: N/A   Occupational History  . Not on file.   Social History Main Topics  . Smoking status: Former Smoker -- 0.25 packs/day for 2 years    Types: Cigarettes    Quit date: 06/02/1966  . Smokeless tobacco: Never Used  . Alcohol Use: No  . Drug Use: No  . Sexual Activity: Not on file   Other Topics Concern  . Not on file   Social History Narrative    Past Surgical History  Procedure Laterality Date  . Appendectomy  1973  . Abdominal hernia repair  1972  . Eye surgery  1972    Bilateral Lens Replacement  . Mandible surgery  1968, 2008    Dislocated Jaw  . Wisdom tooth extraction    . Cataract extraction, bilateral  2005  . 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 Esophagogastroduodenoscopy  06.2011  . Facial reconstruction surgery  2008  . Left heart cath  02.17.2014  Family History  Problem Relation Age of Onset  . Alzheimer's disease Mother     Deceased  . Diabetes Maternal Grandmother   . Heart attack Maternal Grandmother   . Prostate cancer Maternal Uncle   . Diabetes Maternal Uncle   . Healthy Son   . Cataracts Maternal Aunt     No Known Allergies  Current Outpatient Prescriptions on File Prior to Visit  Medication Sig Dispense Refill  . albuterol (PROVENTIL HFA;VENTOLIN HFA) 108 (90 Base) MCG/ACT inhaler Inhale 2 puffs into the lungs every 6 (six) hours as needed for wheezing or shortness of breath. 1 Inhaler 0  . aspirin (ASPIR-81) 81 MG EC tablet Take 1 tablet by mouth daily.    Marland Kitchen b complex vitamins tablet Take 1 tablet by mouth daily.    . Calcium Carbonate-Vitamin D (CALCIUM-VITAMIN D3 PO) Take by mouth daily.    .  diclofenac (VOLTAREN) 50 MG EC tablet Take 1 tablet (50 mg total) by mouth 2 (two) times daily. 30 tablet 0  . doxycycline (VIBRA-TABS) 100 MG tablet Take 1 tablet (100 mg total) by mouth 2 (two) times daily. 14 tablet 0  . erythromycin ophthalmic ointment     . ezetimibe (ZETIA) 10 MG tablet Take 1 tablet (10 mg total) by mouth daily. 30 tablet 2  . hydrochlorothiazide (MICROZIDE) 12.5 MG capsule Take 1 capsule (12.5 mg total) by mouth daily. 90 capsule 1  . methocarbamol (ROBAXIN) 500 MG tablet Take 1 tablet (500 mg total) by mouth every 8 (eight) hours as needed for muscle spasms. 30 tablet 0  . omeprazole (PRILOSEC) 40 MG capsule Take 1 capsule (40 mg total) by mouth daily. 90 capsule 1  . omeprazole (PRILOSEC) 40 MG capsule TAKE 1 CAPSULE (40 MG TOTAL) BY MOUTH DAILY. 90 capsule 1  . ranitidine (ZANTAC) 150 MG tablet Take 1 tablet (150 mg total) by mouth at bedtime. 90 tablet 1  . simvastatin (ZOCOR) 40 MG tablet Take 1 tablet (40 mg total) by mouth every evening. 90 tablet 1  . tamsulosin (FLOMAX) 0.4 MG CAPS capsule Take 1 capsule (0.4 mg total) by mouth daily. 90 capsule 1  . tamsulosin (FLOMAX) 0.4 MG CAPS capsule Take 1 capsule (0.4 mg total) by mouth daily. 90 capsule 1  . timolol (TIMOPTIC) 0.5 % ophthalmic solution Place 1 drop into the left eye daily. 10 mL 12  . tiZANidine (ZANAFLEX) 4 MG capsule 1 tab po q hs pen muscle spasms or tightness 14 capsule 0  . traMADol (ULTRAM) 50 MG tablet Take 1 tablet (50 mg total) by mouth every 6 (six) hours as needed for severe pain. 20 tablet 0   No current facility-administered medications on file prior to visit.    BP 124/84 mmHg  Pulse 77  Temp(Src) 98.2 F (36.8 C) (Oral)  Ht 5\' 5"  (1.651 m)  Wt 187 lb 12.8 oz (85.186 kg)  BMI 31.25 kg/m2  SpO2 96%       Objective:   Physical Exam  General Appearance- Not in acute distress.    Chest and Lung Exam Auscultation: Breath sounds:-Normal. Clear even and unlabored. Adventitious  sounds:- No Adventitious sounds.  Cardiovascular Auscultation:Rythm - Regular, rate and rythm. Heart Sounds -Normal heart sounds.  Abdomen Inspection:-Inspection Normal.  Palpation/Perucssion: Palpation and Percussion of the abdomen reveal- Non Tender, No Rebound tenderness, No rigidity(Guarding) and No Palpable abdominal masses.  Liver:-Normal.  Spleen:- Normal.   Back Mid lumbar spine tenderness to palpation and some left si area. Lt si area  mild tender. Pain  Mild on straight leg lift.  Faint pain on lateral movements and flexion/extension of the spine.  Lower ext neurologic  L5-S1 sensation intact bilaterally. Normal patellar reflexes bilaterally. No foot drop bilaterally.   Abdomen Inspection:-Inspection Normal.  Palpation/Perucssion: Palpation and Percussion of the abdomen reveal- Non Tender, No Rebound tenderness, No rigidity(Guarding) and No Palpable abdominal masses.  Liver:-Normal.  Spleen:- Normal.   Left arm- appears normal. No pain on palpation bicep, tricep or humerus region.       Assessment & Plan:  For your low back pain will give toradol today and rx celebrex. Stop diclofenac, zanflex and tramadol.  Will see how your back pain does. If your radicular pain worsens then will consider referring to neurosurgeon. Based on last MRI, not sure neurosurgeon appointment indicated. But clinically if worsens then will refer.  Will get c-spine, t-spine and left humerus xray today.  For your recent probable reflux will add ranitidine.  Follow up in 3-4 weeks or as needed

## 2015-09-04 NOTE — Patient Instructions (Addendum)
For your low back pain will give toradol today and rx celebrex(coming mail order). Stop diclofenac, zanflex and tramadol.  Will see how your back pain does. If your radicular pain worsens then will consider referring to neurosurgeon. Based on last MRI, I am  not sure neurosurgeon appointment indicated. But clinically if worsens then will refer.  Will get c-spine, t-spine and left humerus xray today.  For your recent probable reflux will add ranitidine.  Follow up in 3-4 weeks or as needed

## 2015-10-19 ENCOUNTER — Ambulatory Visit (INDEPENDENT_AMBULATORY_CARE_PROVIDER_SITE_OTHER): Payer: MEDICARE | Admitting: Medical

## 2015-10-19 ENCOUNTER — Encounter: Payer: Self-pay | Admitting: Medical

## 2015-10-19 VITALS — BP 114/70 | HR 60 | Temp 98.1°F | Ht 65.25 in | Wt 188.6 lb

## 2015-10-19 DIAGNOSIS — M542 Cervicalgia: Secondary | ICD-10-CM | POA: Diagnosis not present

## 2015-10-19 DIAGNOSIS — M5410 Radiculopathy, site unspecified: Secondary | ICD-10-CM | POA: Diagnosis not present

## 2015-10-19 MED ORDER — KETOROLAC TROMETHAMINE 60 MG/2ML IM SOLN
60.0000 mg | Freq: Once | INTRAMUSCULAR | Status: AC
  Administered 2015-10-19: 60 mg via INTRAMUSCULAR

## 2015-10-19 NOTE — Progress Notes (Signed)
Subjective:    Patient ID: Brian [name], male    DOB: Jan 23, 1946, 70 y.o.   MRN: [id number]  HPI   Pt in with some back of neck pain, upper back pain , occiptal ha and some pain radiating down both arms for one week.(about 3 times a week). Arms feels week. Rt thumb and indext finger pinch grip feels week.  Pt had degenerative changes of his neck. Some neuro foraminal narrowing. T-spine disc degeneration.  Pt states has celebrex. Last time took was couple of days ago.    Review of Systems  Constitutional: Negative for fever, chills and fatigue.  Respiratory: Negative for cough, chest tightness, shortness of breath and wheezing.   Cardiovascular: Negative for chest pain and palpitations.  Gastrointestinal: Negative for abdominal pain.  Genitourinary: Negative for dysuria and difficulty urinating.  Musculoskeletal: Positive for back pain and neck pain. Negative for joint swelling, gait problem and neck stiffness.  Skin: Negative for rash.  Neurological: Positive for headaches. Negative for dizziness.       Radicular pain to extremities.  Occipital but no associated neuro signs or symptoms.  Hematological: Negative for adenopathy. Does not bruise/bleed easily.  Psychiatric/Behavioral: Negative for behavioral problems and confusion.    Past Medical History  Diagnosis Date  . Chicken pox   . GERD (gastroesophageal reflux disease)   . Measles   . [place] measles   . Mumps   . Hyperlipidemia   . Edema     Left Leg  . Essential hypertension, benign   . Blind right eye   . Heart attack ([place])   . Chronic back pain   . Dislocation, jaw 1960s  . Personal history of other diseases of digestive system 08.10.12    Gastric ulcer  . 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 Osteopenia 11.12.10    bone density test  . 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 Atypical chest pain      Negative cardiac catheterization 2014     Social History   Social History  .  Marital Status: Single    Spouse Name: N/A  . Number of Children: N/A  . Years of Education: N/A   Occupational History  . Not on file.   Social History Main Topics  . Smoking status: Former Smoker -- 0.25 packs/day for 2 years    Types: Cigarettes    Quit date: 06/02/1966  . Smokeless tobacco: Never Used  . Alcohol Use: No  . Drug Use: No  . Sexual Activity: Not on file   Other Topics Concern  . Not on file   Social History Narrative    Past Surgical History  Procedure Laterality Date  . Appendectomy  1973  . Abdominal hernia repair  1972  . Eye surgery  1972    Bilateral Lens Replacement  . Mandible surgery  1968, 2008    Dislocated Jaw  . Wisdom tooth extraction    . Cataract extraction, bilateral  2005  . 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 Esophagogastroduodenoscopy  06.2011  . Facial reconstruction surgery  2008  . Left heart cath  02.17.2014    Family History  Problem Relation Age of Onset  . Alzheimer's disease Mother     Deceased  . Diabetes Maternal Grandmother   . Heart attack Maternal Grandmother   .  Prostate cancer Maternal Uncle   . Diabetes Maternal Uncle   . Healthy Son   . Cataracts Maternal Aunt     No Known Allergies  Current Outpatient Prescriptions on File Prior to Visit  Medication Sig Dispense Refill  . albuterol (PROVENTIL HFA;VENTOLIN HFA) 108 (90 Base) MCG/ACT inhaler Inhale 2 puffs into the lungs every 6 (six) hours as needed for wheezing or shortness of breath. 1 Inhaler 0  . aspirin (ASPIR-81) 81 MG EC tablet Take 1 tablet by mouth daily.    Marland Kitchen b complex vitamins tablet Take 1 tablet by mouth daily.    . Calcium Carbonate-Vitamin D (CALCIUM-VITAMIN D3 PO) Take by mouth daily.    . celecoxib (CELEBREX) 200 MG capsule Take 1 capsule (200 mg total) by mouth daily. 30 capsule 0  . doxycycline  (VIBRA-TABS) 100 MG tablet Take 1 tablet (100 mg total) by mouth 2 (two) times daily. 14 tablet 0  . ezetimibe (ZETIA) 10 MG tablet Take 1 tablet (10 mg total) by mouth daily. 30 tablet 2  . hydrochlorothiazide (MICROZIDE) 12.5 MG capsule Take 1 capsule (12.5 mg total) by mouth daily. 90 capsule 1  . methocarbamol (ROBAXIN) 500 MG tablet Take 1 tablet (500 mg total) by mouth every 8 (eight) hours as needed for muscle spasms. 30 tablet 0  . omeprazole (PRILOSEC) 40 MG capsule TAKE 1 CAPSULE (40 MG TOTAL) BY MOUTH DAILY. 90 capsule 1  . ranitidine (ZANTAC) 150 MG capsule Take 1 capsule (150 mg total) by mouth 2 (two) times daily. 60 capsule 0  . simvastatin (ZOCOR) 40 MG tablet Take 1 tablet (40 mg total) by mouth every evening. 90 tablet 1  . tamsulosin (FLOMAX) 0.4 MG CAPS capsule Take 1 capsule (0.4 mg total) by mouth daily. 90 capsule 1  . timolol (TIMOPTIC) 0.5 % ophthalmic solution Place 1 drop into the left eye daily. 10 mL 12   No current facility-administered medications on file prior to visit.    BP 114/70 mmHg  Pulse 60  Temp(Src) 98.1 F (36.7 C) (Oral)  Ht 5' 5.25" (1.657 m)  Wt 188 lb 9.6 oz (85.548 kg)  BMI 31.16 kg/m2  SpO2 98%       Objective:   Physical Exam  General- No acute distress. Pleasant patient. Neck- Full range of motion, no jvd. Trapezius tender. Faint mid cspine tender.  Lungs- Clear, even and unlabored. Heart- regular rate and rhythm. Neurologic- CNII- XII grossly intact.  Upper back- mid t-spine area mild tender. Upper ext- good equal strength 5/5 presently. No numbness presently.  .      Assessment & Plan:  For your neck pain will give toradol 60 mg im.  Can resume celebrex 200 mg tomorrow afternoon.  Use your toradol for break through pain.  May restart prior neurontin. If so may need to titrate up to higher doses.  Will get ct of neck  order in.(since metal in mandible and unkown type)  Follow up 10 days or as needed  Fergie Sherbert,  Percell Miller, [employer]

## 2015-10-19 NOTE — Patient Instructions (Addendum)
For your neck pain will give toradol 60 mg im.  Can resume celebrex 200 mg tomorrow afternoon.  Use your toradol for break through pain.  May restart prior neurontin. If so may need to titrate up to higher doses.  Will get ct of neck  order in.(since metal in mandible and unkown type)  Follow up 10 days or as needed

## 2015-10-22 ENCOUNTER — Ambulatory Visit: Payer: MEDICARE | Admitting: Medical

## 2015-10-22 ENCOUNTER — Other Ambulatory Visit: Payer: Self-pay | Admitting: Medical

## 2015-10-23 ENCOUNTER — Ambulatory Visit (HOSPITAL_BASED_OUTPATIENT_CLINIC_OR_DEPARTMENT_OTHER)
Admission: RE | Admit: 2015-10-23 | Discharge: 2015-10-23 | Disposition: A | Discharge status: Home / Self Care | Payer: MEDICARE | Source: Ambulatory Visit | Attending: Medical | Admitting: Medical

## 2015-10-23 ENCOUNTER — Telehealth: Payer: Self-pay | Admitting: Medical

## 2015-10-23 ENCOUNTER — Ambulatory Visit (HOSPITAL_BASED_OUTPATIENT_CLINIC_OR_DEPARTMENT_OTHER): Payer: MEDICARE

## 2015-10-23 ENCOUNTER — Other Ambulatory Visit: Payer: Self-pay | Admitting: Medical

## 2015-10-23 DIAGNOSIS — M542 Cervicalgia: Secondary | ICD-10-CM | POA: Insufficient documentation

## 2015-10-23 DIAGNOSIS — M47812 Spondylosis without myelopathy or radiculopathy, cervical region: Secondary | ICD-10-CM | POA: Insufficient documentation

## 2015-10-23 DIAGNOSIS — M4802 Spinal stenosis, cervical region: Secondary | ICD-10-CM | POA: Diagnosis not present

## 2015-10-23 MED ORDER — GABAPENTIN 100 MG PO CAPS: 100.0000 mg | ORAL_CAPSULE | Freq: Three times a day (TID) | ORAL | Status: DC

## 2015-10-23 NOTE — Telephone Encounter (Signed)
New order placed for ct

## 2015-10-23 NOTE — Telephone Encounter (Signed)
rx of gabapentin sent to his mail order pharmacy.

## 2015-10-26 ENCOUNTER — Telehealth: Payer: Self-pay | Admitting: Medical

## 2015-10-26 MED ORDER — OMEPRAZOLE 40 MG PO CPDR: DELAYED_RELEASE_CAPSULE | ORAL | Status: DC

## 2015-10-26 NOTE — Telephone Encounter (Signed)
Relation to [id number] Call back Garza: Granville Herkimer, Merkel Alexander [phone] (Phone) [phone] (Fax)         Reason for call:  Patient requesting a refill omeprazole (PRILOSEC) 40 MG capsule

## 2015-11-16 ENCOUNTER — Telehealth: Payer: Self-pay | Admitting: Medical

## 2015-11-16 NOTE — Telephone Encounter (Signed)
  Sounds like pt needs to be seen. I can't see him today. Out next week. Can someone else see him today or next week.,

## 2015-11-16 NOTE — Telephone Encounter (Signed)
Caller name: Hilal Relationship to patient: self Can be reached: [phone]  Reason for call: pt states gabapentin causing bad reaction and this morning made him pass out. Transferred to [facility] with [facility].

## 2015-11-16 NOTE — Telephone Encounter (Signed)
Called to follow up with patient.  Pt states he did not go to the ER.  States he did not feel that he needed to go.  States he thinks he passed out due to increase in medication.  He says he will be fine once he cuts back.  Currently, his only symptoms is nausea.  Pt was advised that he's not going to go to the ER, to come in for assessment.  Pt declined stating he would not be able to come in today, but would be able to come in next week.  PCP will be out of the office next week.  Would you be willing to see patient in PCP's absence?  Please advise.

## 2015-11-16 NOTE — Telephone Encounter (Signed)
[facility] Day - Client TELEPHONE ADVICE RECORD [facility] Patient Name: ZACCAI DRUDGE DOB: June 25, 1945 Initial Comment Pt has been taking gabapentin and making him feel weird, this am he passed out Nurse Assessment Nurse: Dimas Chyle, RN, Dellis Filbert Date/Time Eilene Ghazi Time): 11/16/2015 11:02:11 AM Confirm and document reason for call. If symptomatic, describe symptoms. You must click the next button to save text entered. ---Pt has been taking gabapentin and making him feel weird, this am he passed out. On new Rx for a week. 100 mg tid. Has the patient traveled out of the country within the last 30 days? ---No Does the patient have any new or worsening symptoms? ---Yes Will a triage be completed? ---Yes Related visit to physician within the last 2 weeks? ---Yes Does the PT have any chronic conditions? (i.e. diabetes, asthma, etc.) ---No Is this a behavioral health or substance abuse call? ---No Guidelines Guideline Title Affirmed Question Affirmed Notes Fainting [1] Age > 50 years AND [2] now alert and feels fine Final Disposition User Go to ED Now (or PCP triage) Dimas Chyle, RN, Dellis Filbert Referrals GO TO FACILITY REFUSED Disagree/Comply: Comply

## 2015-11-16 NOTE — Telephone Encounter (Signed)
Noted.  Called patient and scheduled appt with Elyn Aquas, PA-C on Monday at 9:30 am.  Pt was advised if his symptoms worsen or he feels like he's able to pass out/passes out again, to call EMS and go to the ER.  He stated understanding.

## 2015-11-16 NOTE — Telephone Encounter (Signed)
I can see him next week if he is willing. Ok to schedule.

## 2015-11-19 ENCOUNTER — Ambulatory Visit (INDEPENDENT_AMBULATORY_CARE_PROVIDER_SITE_OTHER): Payer: MEDICARE | Admitting: Physician Assistant

## 2015-11-19 ENCOUNTER — Encounter: Payer: Self-pay | Admitting: Physician Assistant

## 2015-11-19 ENCOUNTER — Telehealth: Payer: Self-pay | Admitting: Medical

## 2015-11-19 VITALS — BP 125/73 | HR 65 | Temp 98.1°F | Resp 16 | Ht 65.25 in | Wt 187.5 lb

## 2015-11-19 DIAGNOSIS — M5090 Cervical disc disorder, unspecified, unspecified cervical region: Secondary | ICD-10-CM | POA: Diagnosis not present

## 2015-11-19 DIAGNOSIS — G4730 Sleep apnea, unspecified: Secondary | ICD-10-CM | POA: Diagnosis not present

## 2015-11-19 DIAGNOSIS — R55 Syncope and collapse: Secondary | ICD-10-CM | POA: Diagnosis not present

## 2015-11-19 DIAGNOSIS — R0602 Shortness of breath: Secondary | ICD-10-CM

## 2015-11-19 LAB — CBC
HEMATOCRIT: 40.8 % (ref 39.0–52.0)
Hemoglobin: 13.4 g/dL (ref 13.0–17.0)
MCHC: 32.8 g/dL (ref 30.0–36.0)
MCV: 84.1 fl (ref 78.0–100.0)
Platelets: 354 10*3/uL (ref 150.0–400.0)
RBC: 4.85 Mil/uL (ref 4.22–5.81)
RDW: 13.1 % (ref 11.5–15.5)
WBC: 5.2 10*3/uL (ref 4.0–10.5)

## 2015-11-19 LAB — COMPREHENSIVE METABOLIC PANEL
ALBUMIN: 3.9 g/dL (ref 3.5–5.2)
ALT: 20 U/L (ref 0–53)
AST: 21 U/L (ref 0–37)
Alkaline Phosphatase: 76 U/L (ref 39–117)
BUN: 13 mg/dL (ref 6–23)
CHLORIDE: 101 meq/L (ref 96–112)
CO2: 29 mEq/L (ref 19–32)
Calcium: 9.7 mg/dL (ref 8.4–10.5)
Creatinine, Ser: 1.13 mg/dL (ref 0.40–1.50)
GFR: 82.04 mL/min (ref >60.00)
GLUCOSE: 114 mg/dL — AB (ref 70–99)
POTASSIUM: 3.4 meq/L — AB (ref 3.5–5.1)
SODIUM: 139 meq/L (ref 135–145)
Total Bilirubin: 0.9 mg/dL (ref 0.2–1.2)
Total Protein: 7.6 g/dL (ref 6.0–8.3)

## 2015-11-19 NOTE — Progress Notes (Signed)
Patient presents to clinic today c/o syncopal episode occurring 10/23/15 while at home. Patient endorses he was laughing hard when he got short of breath and began coughing. States he then passed out. Was sitting at the time, so no head trauma that he can recollect. The event was witnessed by his granddaughter, who is 72 years old. States he was out for a couple of minutes before being roused by his granddaughter. States he felt nauseated and lightheaded for about 15 minutes. Denies chest pain, palpitations or AMS. Has full recollection of events leading to episode. Endorses he had not had anything to eat or drink that day, and was feeling hot prior to coughing spell.  Patient denies recurrence of symptoms since that time. Does note chronic SOB with walking up stairs only. Denies SOB with regular ambulation. Denies chest pain. Has history of non significant CAD, noted on cath from 2014. Has had recent echocardiogram revealing preserved systolic function.  Patient with history of sleep apnea, not currently on CPAP. Would like referral to sleep medicine.  Patient also with history of chronic neck and back pain with history of cervical disc disease and radiculopathy. Was recently restarted on his Gabapentin. Is taking as directed without side effect. Notes improvement in pain but would like to see a specialist.  Past Medical History  Diagnosis Date  . Chicken pox   . GERD (gastroesophageal reflux disease)   . Measles   . [place] measles   . Mumps   . Hyperlipidemia   . Edema     Left Leg  . Essential hypertension, benign   . Blind right eye   . Heart attack ([place])   . Chronic back pain   . Dislocation, jaw 1960s  . Personal history of other diseases of digestive system 08.10.12    Gastric ulcer  . 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 Osteopenia 11.12.10    bone density test  . 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 Atypical chest pain      Negative cardiac catheterization  2014    Current Outpatient Prescriptions on File Prior to Visit  Medication Sig Dispense Refill  . albuterol (PROVENTIL HFA;VENTOLIN HFA) 108 (90 Base) MCG/ACT inhaler Inhale 2 puffs into the lungs every 6 (six) hours as needed for wheezing or shortness of breath. 1 Inhaler 0  . aspirin (ASPIR-81) 81 MG EC tablet Take 1 tablet by mouth daily.    Marland Kitchen b complex vitamins tablet Take 1 tablet by mouth daily.    . Calcium Carbonate-Vitamin D (CALCIUM-VITAMIN D3 PO) Take by mouth daily.    . celecoxib (CELEBREX) 200 MG capsule Take 1 capsule (200 mg total) by mouth daily. 30 capsule 0  . ezetimibe (ZETIA) 10 MG tablet Take 1 tablet (10 mg total) by mouth daily. 30 tablet 2  . hydrochlorothiazide (MICROZIDE) 12.5 MG capsule Take 1 capsule (12.5 mg total) by mouth daily. 90 capsule 1  . methocarbamol (ROBAXIN) 500 MG tablet Take 1 tablet (500 mg total) by mouth every 8 (eight) hours as needed for muscle spasms. 30 tablet 0  . omeprazole (PRILOSEC) 40 MG capsule TAKE 1 CAPSULE (40 MG TOTAL) BY MOUTH DAILY. 90 capsule 1  . ranitidine (ZANTAC) 150 MG capsule Take 1 capsule (150 mg total) by mouth 2 (two) times daily. 60 capsule 0  . simvastatin (ZOCOR) 40 MG tablet TAKE 1 TABLET EVERY EVENING 90 tablet 1  . tamsulosin (FLOMAX) 0.4 MG CAPS capsule Take 1 capsule (0.4 mg total) by mouth  daily. 90 capsule 1  . timolol (TIMOPTIC) 0.5 % ophthalmic solution Place 1 drop into the left eye daily. 10 mL 12   No current facility-administered medications on file prior to visit.    Allergies  Allergen Reactions  . Gabapentin Other (See Comments)    Feel "weird"; Nausea; Weakness; Syncope    Family History  Problem Relation Age of Onset  . Alzheimer's disease Mother     Deceased  . Diabetes Maternal Grandmother   . Heart attack Maternal Grandmother   . Prostate cancer Maternal Uncle   . Diabetes Maternal Uncle   . Healthy Son   . Cataracts Maternal Aunt     Social History   Social History  . Marital  Status: Single    Spouse Name: N/A  . Number of Children: N/A  . Years of Education: N/A   Social History Main Topics  . Smoking status: Former Smoker -- 0.25 packs/day for 2 years    Types: Cigarettes    Quit date: 06/02/1966  . Smokeless tobacco: Never Used  . Alcohol Use: No  . Drug Use: No  . Sexual Activity: Not Asked   Other Topics Concern  . None   Social History Narrative   Review of Systems - See HPI.  All other ROS are negative.  BP 125/73 mmHg  Pulse 65  Temp(Src) 98.1 F (36.7 C) (Oral)  Resp 16  Ht 5' 5.25" (1.657 m)  Wt 187 lb 8 oz (85.049 kg)  BMI 30.98 kg/m2  SpO2 98%  Physical Exam  Constitutional: He is oriented to person, place, and time and well-developed, well-nourished, and in no distress.  HENT:  Head: Normocephalic and atraumatic.  Right Ear: External ear normal.  Left Ear: External ear normal.  Nose: Nose normal.  Mouth/Throat: Oropharynx is clear and moist. No oropharyngeal exudate.  TM within normal limits bilaterally.  Eyes: Conjunctivae and EOM are normal.  Neck: Neck supple. No thyromegaly present.  Cardiovascular: Normal rate, regular rhythm, normal heart sounds and intact distal pulses.   Pulmonary/Chest: Effort normal and breath sounds normal. No respiratory distress. He has no wheezes. He has no rales. He exhibits no tenderness.  Abdominal: Soft. Bowel sounds are normal.  Neurological: He is alert and oriented to person, place, and time. No cranial nerve deficit. Gait normal. GCS score is 15.  Skin: Skin is warm and dry. No rash noted.  Psychiatric: Affect normal.  Vitals reviewed.   No results found for this or any previous visit (from the past 2160 hour(s)).  Assessment/Plan: 1. Syncope and collapse EKG obtained today. Stable. No acute findings. Episode seems to be a vasovagal episode brought on by a number of factors -- lack of food or fluids, heat, overstimulation for coughing. No head trauma. No residual symptoms. Discussed  appropriate hydration and diet. Will check CBC and CMP. Giving syncopal event, SOBOE and comorbid conditions, will check Echo and Lexiscan. Alarm signs/symptoms discussed with patient that would prompt ER assessment. Patient voices understanding.  - EKG 12-Lead - ECHO COMPLETE; Future - Myocardial Perfusion Imaging; Future - CBC - Comp Met (CMET)  2. SOBOE (shortness of breath on exertion) Will check Echo and Lexiscan to assess cardiac contribution. Referral to Pulmonology pleased for assessment of sleep apnea. Will check labs today. - Ambulatory referral to Pulmonology - ECHO COMPLETE; Future - Myocardial Perfusion Imaging; Future - CBC - Comp Met (CMET)  3. Sleep apnea Referral to Pulmonary placed. - Ambulatory referral to Pulmonology  4. Cervical neck pain  with evidence of disc disease Improving pain with Gabapentin. Discussed dose change and other medications. Patient declines at present. Referral to Neurosurgery placed per patient preference. - Ambulatory referral to Neurosurgery   Leeanne Rio, PA-C

## 2015-11-19 NOTE — Telephone Encounter (Signed)
Patient was referred to Neurosurgery, referral has been sent to [facility], [place] location/awaiting appt

## 2015-11-19 NOTE — Telephone Encounter (Signed)
Relation to [id number] Call back number:[phone] Pharmacy:  Reason for call:  Patient requesting a neurologist in the [facility] location & morning appointment due to transportation. Please advise

## 2015-11-19 NOTE — Patient Instructions (Signed)
Your examination and vitals look great today.   We walked you > 600 feet without any noted shortness of breath. Dizziness, tachycardia or decrease oxygen level.  Your EKG was stable.  We will stop the Gabapentin at your request. Please reconsider letting us start something else for pain. You will be contacted for assessment by Pulmonology, Neurosurgery and an appointment for Echocardiogram and stress test.  Stay hydrated. Do not skip meals. Take other medications as directed.  If there is any recurrence of symptoms, please call 911.

## 2015-11-19 NOTE — Progress Notes (Signed)
Pre visit review using our clinic review tool, if applicable. No additional management support is needed unless otherwise documented below in the visit note/SLS  

## 2015-11-29 ENCOUNTER — Other Ambulatory Visit (HOSPITAL_BASED_OUTPATIENT_CLINIC_OR_DEPARTMENT_OTHER): Payer: Self-pay | Admitting: Obstetrics and Gynecology

## 2015-12-10 ENCOUNTER — Telehealth: Payer: Self-pay | Admitting: Medical

## 2015-12-10 NOTE — Telephone Encounter (Signed)
Delsa Sale, please check on the status of the neurosurgery referral and contact patient. Thank you!

## 2015-12-10 NOTE — Telephone Encounter (Signed)
Pt called in to follow up on [facility] referral lab results.    Please advise   Thanks.

## 2015-12-11 NOTE — Telephone Encounter (Signed)
Patient was already made aware of referral, patient is scheduled with Dr Bonnee Quin on 12/21/15. Patient is requesting lab results

## 2015-12-11 NOTE — Telephone Encounter (Signed)
Called pt and notified him that we are awaiting fax results. I have checked both fax machines and do not see results from either procedure. Will call [facility] again tomorrow if no results received by that time.

## 2015-12-11 NOTE — Telephone Encounter (Signed)
Called pt, he did not want lab results, but wanted results from echo and stress test at [facility]. Results not available in Care Everywhere. Called and spoke w/ Gerald Stabs at [facility] who stated that they did have results. She is going to fax results ATTN: Elyn Aquas, PA-C. Awaiting fax.

## 2015-12-11 NOTE — Telephone Encounter (Signed)
Note in system that all labs were discussed with patient per my recommendations. Can you please call and go over these results with him again? Thank you.

## 2015-12-12 NOTE — Telephone Encounter (Signed)
Results received, forwarded to ordering provider for review.

## 2015-12-24 NOTE — Telephone Encounter (Signed)
Spoke with patient on 12/11/15 regarding stress test and echo results. Will have reports scanned into chart. Echo stable and low-risk stress test. Patient has follow-up with Neurosurgery. He is to schedule FU with PCP.

## 2016-01-01 ENCOUNTER — Encounter: Payer: Self-pay | Admitting: Physician Assistant

## 2016-01-04 ENCOUNTER — Ambulatory Visit: Payer: MEDICARE

## 2016-01-08 ENCOUNTER — Encounter: Payer: Self-pay | Admitting: *Deleted

## 2016-01-08 ENCOUNTER — Ambulatory Visit: Payer: MEDICARE

## 2016-01-08 ENCOUNTER — Telehealth: Payer: Self-pay | Admitting: Medical

## 2016-01-08 ENCOUNTER — Ambulatory Visit (INDEPENDENT_AMBULATORY_CARE_PROVIDER_SITE_OTHER): Payer: MEDICARE | Admitting: *Deleted

## 2016-01-08 VITALS — BP 122/70 | HR 60 | Resp 14 | Ht 66.0 in | Wt 190.0 lb

## 2016-01-08 DIAGNOSIS — Z23 Encounter for immunization: Secondary | ICD-10-CM

## 2016-01-08 DIAGNOSIS — Z0000 Encounter for general adult medical examination without abnormal findings: Secondary | ICD-10-CM

## 2016-01-08 NOTE — Progress Notes (Addendum)
Subjective:   Brian [name] is a 70 y.o. male who presents for Medicare Annual/Subsequent preventive examination.  Review of Systems:  No ROS.  Medicare Wellness Visit.  Cardiac Risk Factors include: advanced age (>65men, >34 women);dyslipidemia;male gender;hypertension;obesity (BMI >30kg/m2);sedentary lifestyle  Sleep patterns: Pt reports he does not sleep well. Goes to bed then wakes up a few hours later. Does not nap during the day. He reports this has been an ongoing issue for him because he used to work 18 hour days and his body is not used to sleeping more than a few hours at a time. He is not interested in further workup or medication to help with sleep at this time.    Home Safety/Smoke Alarms: Lives w/ relatives. Feels safe in home. Has to walk up 1 flight of 14 stairs in home which he can manage as long as he goes slowly.  Living environment; residence and Firearm Safety: No firearms.   Seat Belt Safety/Bike Helmet: Wears seat belt.   Counseling:   Eye Exam- Dr. Antionette Fairy every 6 months Dental- Dentures upper and lower. No regular dental follow-up.  Male:   CCS- Last in 2009 in [place], normal per patient. Records not available.    PSA-   Lab Results  Component Value Date   PSA 0.65 03/07/2015   PSA 0.52 06/16/2014       Objective:    Vitals: BP 122/70 (BP Location: Right Arm, Patient Position: Sitting, Cuff Size: Normal)   Pulse 60   Resp 14   Ht 5\' 6"  (1.676 m)   Wt 190 lb (86.2 kg)   SpO2 97%   BMI 30.67 kg/m   Body mass index is 30.67 kg/m.  Tobacco History  Smoking Status  . Former Smoker  . Packs/day: 0.25  . Years: 2.00  . Types: Cigarettes  . Quit date: 06/02/1966  Smokeless Tobacco  . Never Used     Counseling given: Not Answered   Past Medical History:  Diagnosis Date  . Atypical chest pain     Negative cardiac catheterization 2014  . 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 Blind right eye   . Borderline glaucoma with ocular hypertension  04.10.2013  . Chicken pox   . Chronic back pain   . Dislocation, jaw 1960s  . Edema    Left Leg  . Essential hypertension, benign   . GERD (gastroesophageal reflux disease)   . [place] measles   . Heart attack ([place])   . Hyperlipidemia   . Measles   . Mumps   . Osteopenia 11.12.10   bone density test  . Personal history of other diseases of digestive system 08.10.12   Gastric ulcer   Past Surgical History:  Procedure Laterality Date  . ABDOMINAL HERNIA REPAIR  1972  . APPENDECTOMY  1973  . CATARACT EXTRACTION, BILATERAL  2005  . ESOPHAGOGASTRODUODENOSCOPY  06.2011  . EYE SURGERY  1972   Bilateral Lens Replacement  . FACIAL RECONSTRUCTION SURGERY  2008  . LEFT HEART CATH  02.17.2014  . [place], 2008   Dislocated Jaw  . PARS PLANA VITRECTOMY W/ REPAIR OF MACULAR HOLE  2005   macular hole repair, right eye  . PARS PLANA VITRECTOMY W/ REPAIR OF MACULAR HOLE  2010   mechanical, left eye  . PROSTATE BIOPSY  [place] 2017   w/ Dr. Nevada Crane  . WISDOM TOOTH EXTRACTION     Family History  Problem Relation Age of Onset  . Alzheimer's disease Mother  Deceased  . Diabetes Maternal Grandmother   . Heart attack Maternal Grandmother   . Prostate cancer Maternal Uncle   . Diabetes Maternal Uncle   . Healthy Son   . Cataracts Maternal Aunt    History  Sexual Activity  . Sexual activity: No    Outpatient Encounter Prescriptions as of 01/08/2016  Medication Sig  . albuterol (PROVENTIL HFA;VENTOLIN HFA) 108 (90 Base) MCG/ACT inhaler Inhale 2 puffs into the lungs every 6 (six) hours as needed for wheezing or shortness of breath.  Marland Kitchen aspirin (ASPIR-81) 81 MG EC tablet Take 1 tablet by mouth daily.  Marland Kitchen b complex vitamins tablet Take 1 tablet by mouth daily.  . Calcium Carbonate-Vitamin D (CALCIUM-VITAMIN D3 PO) Take by mouth daily.  Marland Kitchen ezetimibe (ZETIA) 10 MG tablet Take 1 tablet (10 mg total) by mouth daily.  . hydrochlorothiazide (MICROZIDE) 12.5 MG capsule Take 1 capsule  (12.5 mg total) by mouth daily.  . mirabegron ER (MYRBETRIQ) 50 MG TB24 tablet Take 50 mg by mouth daily.  . pantoprazole (PROTONIX) 40 MG tablet Take 40 mg by mouth 2 (two) times daily.  . ranitidine (ZANTAC) 150 MG capsule Take 1 capsule (150 mg total) by mouth 2 (two) times daily.  . simvastatin (ZOCOR) 40 MG tablet TAKE 1 TABLET EVERY EVENING  . tamsulosin (FLOMAX) 0.4 MG CAPS capsule Take 1 capsule (0.4 mg total) by mouth daily.  . timolol (TIMOPTIC) 0.5 % ophthalmic solution Place 1 drop into the left eye daily.  . [DISCONTINUED] timolol (TIMOPTIC) 0.5 % ophthalmic solution Place 1 drop into both eyes 2 (two) times daily.  . [DISCONTINUED] celecoxib (CELEBREX) 200 MG capsule Take 1 capsule (200 mg total) by mouth daily. (Patient not taking: Reported on 01/08/2016)  . [DISCONTINUED] methocarbamol (ROBAXIN) 500 MG tablet Take 1 tablet (500 mg total) by mouth every 8 (eight) hours as needed for muscle spasms. (Patient not taking: Reported on 01/08/2016)  . [DISCONTINUED] omeprazole (PRILOSEC) 40 MG capsule TAKE 1 CAPSULE (40 MG TOTAL) BY MOUTH DAILY. (Patient not taking: Reported on 01/08/2016)   No facility-administered encounter medications on file as of 01/08/2016.     Activities of Daily Living In your present state of health, do you have any difficulty performing the following activities: 01/08/2016  Hearing? N  Vision? N  Difficulty concentrating or making decisions? N  Walking or climbing stairs? N  Dressing or bathing? N  Doing errands, shopping? N  Preparing Food and eating ? N  Using the Toilet? N  In the past six months, have you accidently leaked urine? N  Do you have problems with loss of bowel control? N  Managing your Medications? N  Managing your Finances? N  Housekeeping or managing your Housekeeping? N  Some recent data might be hidden    Patient Care Team: Mackie Pai, PA-C as PCP - General (Internal Medicine) Margaretmary Bayley, MD as Consulting Physician  (Gastroenterology) Karie Chimera, PA-C as Physician Assistant (Pulmonary Disease) Noralyn Pick, MD as Consulting Physician (Ophthalmology) Bonnee Quin, PA-C as Physician Assistant (Neurosurgery) Pamala Hurry, MD as Consulting Physician (Urology)   Assessment:    Physical assessment deferred to PCP.  Exercise Activities and Dietary recommendations Current Exercise Habits: The patient does not participate in regular exercise at present, Exercise limited by: orthopedic condition(s) (Chronic back pain)  Diet (meal preparation, eat out, water intake, caffeinated beverages, dairy products, fruits and vegetables): Pt grazes throughout the day, no formal meals typically. May eat cookies, toast, soup. Prepares meals at home. '  I eat things I know I shouldn't.' Drinks hot chocolate. Breakfast: Maceo Pro eggs/omlet, french toast Dietary counseling provided. He does like fruit and vegetables (strawberries, kale, carrots), but has chronic GI issues and is limited in what things he can tolerate. He is interested in losing weight and we discussed diet and exercise at length. He has a 72 y/o male cousin who is also overweight and he has challenged her to a weight loss/exercise contest. He is inspired by his 56 y/o cousin who has osteogenesis imperfecta but has managed to overcome multiple fractures and is now relatively strong. He wants to be healthier and set a better example for his cousins.  Goals    . Increase physical activity          Start walking 3-4 times weekly. Increase as tolerated.    . Weight (lb) < 160 lb (72.6 kg)      Fall Risk Fall Risk  01/08/2016 10/19/2015 10/18/2014 06/16/2014  Falls in the past year? Yes No Yes Yes  Number falls in past yr: 1 - 2 or more 2 or more  Injury with Fall? No - No -  Risk for fall due to : Impaired vision;Impaired balance/gait - - -   Depression Screen PHQ 2/9 Scores 01/08/2016 10/19/2015 10/18/2014  PHQ - 2 Score 0 0 0  PHQ- 9 Score 5 - -     Cognitive Testing MMSE - Mini Mental State Exam 01/08/2016  Orientation to time 5  Orientation to Place 5  Registration 3  Attention/ Calculation 5  Recall 3  Language- name 2 objects 2  Language- repeat 1  Language- follow 3 step command 3  Language- read & follow direction 1  Write a sentence 1  Copy design 1  Total score 30    Immunization History  Administered Date(s) Administered  . Influenza,inj,Quad PF,36+ Mos 02/15/2013, 06/16/2014, 06/06/2015  . Influenza-Unspecified 04/26/2012  . Pneumococcal Conjugate-13 01/08/2016  . Pneumococcal Polysaccharide-23 11/21/2010, 06/06/2015  . Tdap 11/21/2010  . Zoster 02/15/2013   Screening Tests Health Maintenance  Topic Date Due  . INFLUENZA VACCINE  01/01/2016  . Hepatitis C Screening  06/05/2016 (Originally December 29, 1945)  . COLONOSCOPY  06/05/2017  . TETANUS/TDAP  11/20/2020  . ZOSTAVAX  Completed  . PNA vac Low Risk Adult  Completed      Plan:   Follow-up with PCP as scheduled for CPE.  Increase physical activity as tolerated. Diet and exercise counseling given to patient. Prevnar-13 given today.   During the course of the visit the patient was educated and counseled about the following appropriate screening and preventive services:   Vaccines to include Pneumoccal, Influenza, Hepatitis B, Td, Zostavax, HCV  Colorectal cancer screening  Diabetes screening  Prostate Cancer Screening  Glaucoma screening  Nutrition counseling   Smoking cessation counseling  Patient Instructions (the written plan) was given to the patient.    Dorrene German, RN  01/08/2016  Agree with assessment and plan of RN. When in for CPE/next visit. Will discucss if needs psa, review fall hx(may refer to PT) and discuss insomnia(may offer treatment)  Saguier, Percell Miller, PA-C

## 2016-01-08 NOTE — Patient Instructions (Addendum)
Brian [name] , Thank you for taking time to come for your Medicare Wellness Visit. I appreciate your ongoing commitment to your health goals. Please review the following plan we discussed and let me know if I can assist you in the future.   These are the goals we discussed: Goals    . Increase physical activity          Start walking 3-4 times weekly. Increase as tolerated.    . Weight (lb) < 160 lb (72.6 kg)       Follow-up with [name], PA-C as scheduled.  Bring a copy of your advanced directives to your next appointment if you have completed them at that time.  Good luck with the weight loss contest with your cousin! You can do this!  This is a list of the screening recommended for you and due dates:  Health Maintenance  Topic Date Due  . Flu Shot  01/01/2016  .  Hepatitis C: One time screening is recommended by Center for Disease Control  (CDC) for  adults born from [birth year] through 1967.   06/05/2016*  . Pneumonia vaccines (2 of 2 - PCV13) 06/05/2016  . Colon Cancer Screening  06/05/2017  . Tetanus Vaccine  11/20/2020  . Shingles Vaccine  Completed  *Topic was postponed. The date shown is not the original due date.    Fat and Cholesterol Restricted Diet High levels of fat and cholesterol in your blood may lead to various health problems, such as diseases of the heart, blood vessels, gallbladder, liver, and pancreas. Fats are concentrated sources of energy that come in various forms. Certain types of fat, including saturated fat, may be harmful in excess. Cholesterol is a substance needed by your body in small amounts. Your body makes all the cholesterol it needs. Excess cholesterol comes from the food you eat. When you have high levels of cholesterol and saturated fat in your blood, health problems can develop because the excess fat and cholesterol will gather along the walls of your blood vessels, causing them to narrow. Choosing the right foods will help you control your intake  of fat and cholesterol. This will help keep the levels of these substances in your blood within normal limits and reduce your risk of disease. WHAT IS MY PLAN? Your health care provider recommends that you:  Get no more than __________ % of the total calories in your daily diet from fat.  Limit your intake of saturated fat to less than ______% of your total calories each day.  Limit the amount of cholesterol in your diet to less than _________mg per day. WHAT TYPES OF FAT SHOULD I CHOOSE?  Choose healthy fats more often. Choose monounsaturated and polyunsaturated fats, such as olive and canola oil, flaxseeds, walnuts, almonds, and seeds.  Eat more omega-3 fats. Good choices include salmon, mackerel, sardines, tuna, flaxseed oil, and ground flaxseeds. Aim to eat fish at least two times a week.  Limit saturated fats. Saturated fats are primarily found in animal products, such as meats, butter, and cream. Plant sources of saturated fats include palm oil, palm kernel oil, and coconut oil.  Avoid foods with partially hydrogenated oils in them. These contain trans fats. Examples of foods that contain trans fats are stick margarine, some tub margarines, cookies, crackers, and other baked goods. WHAT GENERAL GUIDELINES DO I NEED TO FOLLOW? These guidelines for healthy eating will help you control your intake of fat and cholesterol:  Check food labels carefully to identify foods  with trans fats or high amounts of saturated fat.  Fill one half of your plate with vegetables and green salads.  Fill one fourth of your plate with whole grains. Look for the word "whole" as the first word in the ingredient list.  Fill one fourth of your plate with lean protein foods.  Limit fruit to two servings a day. Choose fruit instead of juice.  Eat more foods that contain soluble fiber. Examples of foods that contain this type of fiber are apples, broccoli, carrots, beans, peas, and barley. Aim to get 20-30 g of  fiber per day.  Eat more home-cooked food and less restaurant, buffet, and fast food.  Limit or avoid alcohol.  Limit foods high in starch and sugar.  Limit fried foods.  Cook foods using methods other than frying. Baking, boiling, grilling, and broiling are all great options.  Lose weight if you are overweight. Losing just 5-10% of your initial body weight can help your overall health and prevent diseases such as diabetes and heart disease. WHAT FOODS CAN I EAT? Grains Whole grains, such as whole wheat or whole grain breads, crackers, cereals, and pasta. Unsweetened oatmeal, bulgur, barley, quinoa, or brown rice. Corn or whole wheat flour tortillas. Vegetables Fresh or frozen vegetables (raw, steamed, roasted, or grilled). Green salads. Fruits All fresh, canned (in natural juice), or frozen fruits. Meat and Other Protein Products Ground beef (85% or leaner), grass-fed beef, or beef trimmed of fat. Skinless chicken or [place]. Ground chicken or [place]. Pork trimmed of fat. All fish and seafood. Eggs. Dried beans, peas, or lentils. Unsalted nuts or seeds. Unsalted canned or dry beans. Dairy Low-fat dairy products, such as skim or 1% milk, 2% or reduced-fat cheeses, low-fat ricotta or cottage cheese, or plain low-fat yogurt. Fats and Oils Tub margarines without trans fats. Light or reduced-fat mayonnaise and salad dressings. Avocado. Olive, canola, sesame, or safflower oils. Natural peanut or almond butter (choose ones without added sugar and oil). The items listed above may not be a complete list of recommended foods or beverages. Contact your dietitian for more options. WHAT FOODS ARE NOT RECOMMENDED? Grains White bread. White pasta. White rice. Cornbread. Bagels, pastries, and croissants. Crackers that contain trans fat. Vegetables White potatoes. Corn. Creamed or fried vegetables. Vegetables in a cheese sauce. Fruits Dried fruits. Canned fruit in light or heavy syrup. Fruit  juice. Meat and Other Protein Products Fatty cuts of meat. Ribs, chicken wings, bacon, sausage, bologna, salami, chitterlings, fatback, hot dogs, bratwurst, and packaged luncheon meats. Liver and organ meats. Dairy Whole or 2% milk, cream, half-and-half, and cream cheese. Whole milk cheeses. Whole-fat or sweetened yogurt. Full-fat cheeses. Nondairy creamers and whipped toppings. Processed cheese, cheese spreads, or cheese curds. Sweets and Desserts Corn syrup, sugars, honey, and molasses. Candy. Jam and jelly. Syrup. Sweetened cereals. Cookies, pies, cakes, donuts, muffins, and ice cream. Fats and Oils Butter, stick margarine, lard, shortening, ghee, or bacon fat. Coconut, palm kernel, or palm oils. Beverages Alcohol. Sweetened drinks (such as sodas, lemonade, and fruit drinks or punches). The items listed above may not be a complete list of foods and beverages to avoid. Contact your dietitian for more information.   This information is not intended to replace advice given to you by your health care provider. Make sure you discuss any questions you have with your health care provider.   Document Released: 05/19/2005 Document Revised: 06/09/2014 Document Reviewed: 08/17/2013 Elsevier Interactive Patient Education [employer].

## 2016-01-08 NOTE — Telephone Encounter (Signed)
Pt had medicare wellness. He had some falls reported, insomnia and other issues. Would you make sure he has follow up in about 3 weeks. Make sure he gets scheduled 30 minutes. Avoid Thursday.

## 2016-01-08 NOTE — Progress Notes (Signed)
Pre visit review using our clinic review tool, if applicable. No additional management support is needed unless otherwise documented below in the visit note. 

## 2016-01-11 NOTE — Telephone Encounter (Signed)
Patient scheduled for 9/8

## 2016-01-21 ENCOUNTER — Encounter: Payer: Self-pay | Admitting: Medical

## 2016-01-21 ENCOUNTER — Ambulatory Visit (INDEPENDENT_AMBULATORY_CARE_PROVIDER_SITE_OTHER): Payer: MEDICARE | Admitting: Medical

## 2016-01-21 ENCOUNTER — Ambulatory Visit (HOSPITAL_BASED_OUTPATIENT_CLINIC_OR_DEPARTMENT_OTHER)
Admission: RE | Admit: 2016-01-21 | Discharge: 2016-01-21 | Disposition: A | Discharge status: Home / Self Care | Payer: MEDICARE | Source: Ambulatory Visit | Attending: Medical | Admitting: Medical

## 2016-01-21 VITALS — BP 114/68 | HR 58 | Temp 97.9°F | Ht 65.25 in | Wt 189.0 lb

## 2016-01-21 DIAGNOSIS — E785 Hyperlipidemia, unspecified: Secondary | ICD-10-CM

## 2016-01-21 DIAGNOSIS — E876 Hypokalemia: Secondary | ICD-10-CM | POA: Diagnosis not present

## 2016-01-21 DIAGNOSIS — S0093XA Contusion of unspecified part of head, initial encounter: Secondary | ICD-10-CM

## 2016-01-21 DIAGNOSIS — N433 Hydrocele, unspecified: Secondary | ICD-10-CM | POA: Insufficient documentation

## 2016-01-21 DIAGNOSIS — N50811 Right testicular pain: Secondary | ICD-10-CM

## 2016-01-21 LAB — LIPID PANEL
CHOL/HDL RATIO: 4
Cholesterol: 212 mg/dL — ABNORMAL HIGH (ref 0–200)
HDL: 54.3 mg/dL (ref >39.00)
LDL Cholesterol: 128 mg/dL — ABNORMAL HIGH (ref 0–99)
NONHDL: 157.29
Triglycerides: 145 mg/dL (ref 0.0–149.0)
VLDL: 29 mg/dL (ref 0.0–40.0)

## 2016-01-21 LAB — COMPREHENSIVE METABOLIC PANEL
ALK PHOS: 66 U/L (ref 39–117)
ALT: 19 U/L (ref 0–53)
AST: 20 U/L (ref 0–37)
Albumin: 4.2 g/dL (ref 3.5–5.2)
BILIRUBIN TOTAL: 1.3 mg/dL — AB (ref 0.2–1.2)
BUN: 12 mg/dL (ref 6–23)
CO2: 29 meq/L (ref 19–32)
CREATININE: 1.17 mg/dL (ref 0.40–1.50)
Calcium: 9.2 mg/dL (ref 8.4–10.5)
Chloride: 103 mEq/L (ref 96–112)
GFR: 78.78 mL/min (ref >60.00)
GLUCOSE: 98 mg/dL (ref 70–99)
Potassium: 3.7 mEq/L (ref 3.5–5.1)
Sodium: 138 mEq/L (ref 135–145)
TOTAL PROTEIN: 7.4 g/dL (ref 6.0–8.3)

## 2016-01-21 MED ORDER — CIPROFLOXACIN HCL 500 MG PO TABS: 500.0000 mg | ORAL_TABLET | Freq: Two times a day (BID) | ORAL | 0 refills | Status: DC

## 2016-01-21 NOTE — Progress Notes (Signed)
Pre visit review using our clinic tool,if applicable. No additional management support is needed unless otherwise documented below in the visit note.  

## 2016-01-21 NOTE — Patient Instructions (Addendum)
For your high cholesterol history will get lipid panel today. Then advise on if adjusting of you lipid medicine is needed.  For history of low k will repeat cmp today.  For mild right testicle pain will get scrotal US. You may have epididymitis. Will go ahead and rx cipro antibiotic.  For head contusion more than 3 days ago I don't think any imagaing studies needed. Your slight abrasion is healing.  Your residual neck pain associated with head contusion is getting better gradually. But if not then would repeat cspine. Prior CT and xray revealed severe osteoarthritis changes.  If lingering pain then would repeat cspine xray. But not needed today.  Follow up 10-14 days or as needed

## 2016-01-21 NOTE — Progress Notes (Signed)
Subjective:    Patient ID: Brian [name], male    DOB: 10-06-1945, 70 y.o.   MRN: [id number]  HPI    Pt states exercise limited.  Pt in today for follow up. He had wellness exam with RN. I reviewed that when done and again reviewed today. I don't see any psa done. He describes negative cystoscopy. Pt urologist wanted to do surgical procedure to reduce frequenncy of urination. Pt declined he states he will only take meds.  Pt also states he had echo cardiogram and stress test. He states those were ok.  Pt states did not get pulmonologist studies not done. I had referred to pulmonologist for sleepy study.He missed sleep study since did not have a ride.  Pt states he has done endoscopy since a I last saw him. He is now on protonix. This is better than omeprazole. Pt states GI did not do colonoscopy. Although Mr. Duffy thinks they got his old records.  Pt his on zocor. No reported side effects. Oct 2016 lipid mild elevated. No cardiac or neurologic signs or symptoms.  Pt had mild low K in the past as well.  Pt also update me that he will get epidural injections on his spine but that has not been set up.  Pt fall on time last year. Tripped in his room. No other event. Pt describes good gate and not feeling dizzy. Won't refer to PT. But if he starts to fall more would refer.   Pt advised to get flu vaccine in one month. Call for nurse visit/flu clinic visit.  Pt mentions some rt testicle pain mild intermittent at times. Feels when taking a shower most often. .  This past Thursday. He he describes bumping his head. On low ceiling in his house. Turned his head quickly. No laceration. No LOC. No confusion of gross motor or sensory function deficits. Some neck pain that has been gradually decreasing then then.      Review of Systems  Constitutional: Negative for chills, fatigue and fever.  HENT: Negative for congestion, drooling and ear pain.   Respiratory: Negative for cough and  stridor.   Cardiovascular: Negative for chest pain and palpitations.  Gastrointestinal: Negative for blood in stool, diarrhea, nausea and vomiting.  Genitourinary: Positive for frequency and testicular pain. Negative for decreased urine volume, discharge, dysuria, hematuria, penile pain and urgency.  Musculoskeletal: Negative for back pain, joint swelling and neck stiffness.  Skin: Negative for rash.       Small abrasion left side forehead.  Neurological: Negative for dizziness, weakness, numbness and headaches.  Hematological: Negative for adenopathy. Does not bruise/bleed easily.  Psychiatric/Behavioral: Negative for agitation and confusion.    Past Medical History:  Diagnosis Date  . Atypical chest pain     Negative cardiac catheterization 2014  . 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 Blind right eye   . 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 Chicken pox   . Chronic back pain   . Dislocation, jaw 1960s  . Edema    Left Leg  . Essential hypertension, benign   . GERD (gastroesophageal reflux disease)   . [place] measles   . Heart attack ([place])   . Hyperlipidemia   . Measles   . Mumps   . Osteopenia 11.12.10   bone density test  . Personal history of other diseases of digestive system 08.10.12   Gastric ulcer     Social History   Social History  . Marital status: Single  Spouse name: N/A  . Number of children: N/A  . Years of education: N/A   Occupational History  . Not on file.   Social History Main Topics  . Smoking status: Former Smoker    Packs/day: 0.25    Years: 2.00    Types: Cigarettes    Quit date: 06/02/1966  . Smokeless tobacco: Never Used  . Alcohol use No  . Drug use: No  . Sexual activity: No   Other Topics Concern  . Not on file   Social History Narrative  . No narrative on file    Past Surgical History:  Procedure Laterality Date  . ABDOMINAL HERNIA REPAIR  1972  . APPENDECTOMY  1973  . CATARACT EXTRACTION, BILATERAL   2005  . ESOPHAGOGASTRODUODENOSCOPY  06.2011  . EYE SURGERY  1972   Bilateral Lens Replacement  . FACIAL RECONSTRUCTION SURGERY  2008  . LEFT HEART CATH  02.17.2014  . [place], 2008   Dislocated Jaw  . PARS PLANA VITRECTOMY W/ REPAIR OF MACULAR HOLE  2005   macular hole repair, right eye  . PARS PLANA VITRECTOMY W/ REPAIR OF MACULAR HOLE  2010   mechanical, left eye  . PROSTATE BIOPSY  [place] 2017   w/ Dr. Nevada Crane  . WISDOM TOOTH EXTRACTION      Family History  Problem Relation Age of Onset  . Alzheimer's disease Mother     Deceased  . Diabetes Maternal Grandmother   . Heart attack Maternal Grandmother   . Prostate cancer Maternal Uncle   . Diabetes Maternal Uncle   . Healthy Son   . Cataracts Maternal Aunt     Allergies  Allergen Reactions  . Gabapentin Other (See Comments)    Feel "weird"; Nausea; Weakness; Syncope    Current Outpatient Prescriptions on File Prior to Visit  Medication Sig Dispense Refill  . albuterol (PROVENTIL HFA;VENTOLIN HFA) 108 (90 Base) MCG/ACT inhaler Inhale 2 puffs into the lungs every 6 (six) hours as needed for wheezing or shortness of breath. 1 Inhaler 0  . aspirin (ASPIR-81) 81 MG EC tablet Take 1 tablet by mouth daily.    Marland Kitchen b complex vitamins tablet Take 1 tablet by mouth daily.    . Calcium Carbonate-Vitamin D (CALCIUM-VITAMIN D3 PO) Take by mouth daily.    Marland Kitchen ezetimibe (ZETIA) 10 MG tablet Take 1 tablet (10 mg total) by mouth daily. 30 tablet 2  . hydrochlorothiazide (MICROZIDE) 12.5 MG capsule Take 1 capsule (12.5 mg total) by mouth daily. 90 capsule 1  . mirabegron ER (MYRBETRIQ) 50 MG TB24 tablet Take 50 mg by mouth daily.    . simvastatin (ZOCOR) 40 MG tablet TAKE 1 TABLET EVERY EVENING 90 tablet 1  . tamsulosin (FLOMAX) 0.4 MG CAPS capsule Take 1 capsule (0.4 mg total) by mouth daily. 90 capsule 1  . timolol (TIMOPTIC) 0.5 % ophthalmic solution Place 1 drop into the left eye daily. 10 mL 12  . pantoprazole (PROTONIX) 40 MG  tablet Take 40 mg by mouth 2 (two) times daily.    . ranitidine (ZANTAC) 150 MG capsule Take 1 capsule (150 mg total) by mouth 2 (two) times daily. (Patient not taking: Reported on 01/21/2016) 60 capsule 0   No current facility-administered medications on file prior to visit.     BP 114/68   Pulse (!) 58   Temp 97.9 F (36.6 C) (Oral)   Ht 5' 5.25" (1.657 m)   Wt 189 lb (85.7 kg)   SpO2 99%  BMI 31.21 kg/m       Objective:   Physical Exam   General Mental Status- Alert. General Appearance- Not in acute distress.   Skin General: Color- Normal Color. Moisture- Normal Moisture. Small narrow 6 mm by 2 cm abrasion on left forehead edge of hairline. Already healing.   Neck Carotid Arteries- Normal color. Moisture- Normal Moisture. No carotid bruits. No JVD. Faint tender paracervical areas.  Chest and Lung Exam Auscultation: Breath Sounds:-Normal.  Cardiovascular Auscultation:Rythm- Regular. Murmurs & Other Heart Sounds:Auscultation of the heart reveals- No Murmurs.  Abdomen Inspection:-Inspeection Normal. Palpation/Percussion:Note:No mass. Palpation and Percussion of the abdomen reveal- Non Tender, Non Distended + BS, no rebound or guarding.   Neurologic Cranial Nerve exam:- CN III-XII intact(No nystagmus), symmetric smile. Strength:- 5/5 equal and symmetric strength both upper and lower extremities Finger to nose intact. Symmetric smile.  Genital- on palpation of scrotum testicles feel symmetric size wise. Rt side mild tenderness and inflammation in epididymal region.     Assessment & Plan:  For your high cholesterol history will get lipid panel today. Then advise on if adjusting of your lipid medicine is needed.  For history of low k will repeat cmp today.  For mild right testicle pain will get scrotal US. You may have epididymitis. Will go ahead and rx cipro antibiotic.  For head contusion more than 3 days ago I don't think any imagaing studies needed. Your  slight abrasion is healing.  Your residual neck pain associated with head contusion is getting better gradually. But if not then would repeat cspine. Prior CT and xray revealed severe osteoarthritis changes.  If lingering pain then would repeat cspine xray. But not needed today.  Follow up 10-14 days or as needed  Tyrique Sporn, Percell Miller, [employer]

## 2016-01-24 ENCOUNTER — Encounter: Payer: Self-pay | Admitting: Medical

## 2016-02-05 ENCOUNTER — Telehealth: Payer: Self-pay | Admitting: Medical

## 2016-02-05 NOTE — Telephone Encounter (Signed)
Caller name: Relationship to patient: Can be reached: [phone] Pharmacy:  Reason for call: Pt states [facility] needs approval to stop taking a med prior to having spinal injections. They sent form for signature to approve. Please return to them.

## 2016-02-08 ENCOUNTER — Encounter: Payer: MEDICARE | Admitting: Medical

## 2016-02-11 ENCOUNTER — Encounter: Payer: Self-pay | Admitting: Medical

## 2016-02-11 ENCOUNTER — Ambulatory Visit (HOSPITAL_BASED_OUTPATIENT_CLINIC_OR_DEPARTMENT_OTHER)
Admission: RE | Admit: 2016-02-11 | Discharge: 2016-02-11 | Disposition: A | Discharge status: Home / Self Care | Payer: MEDICARE | Source: Ambulatory Visit | Attending: Medical | Admitting: Medical

## 2016-02-11 ENCOUNTER — Other Ambulatory Visit: Payer: Self-pay

## 2016-02-11 ENCOUNTER — Ambulatory Visit (INDEPENDENT_AMBULATORY_CARE_PROVIDER_SITE_OTHER): Payer: MEDICARE | Admitting: Medical

## 2016-02-11 VITALS — BP 125/71 | HR 67 | Temp 98.2°F | Ht 65.25 in | Wt 186.4 lb

## 2016-02-11 DIAGNOSIS — E785 Hyperlipidemia, unspecified: Secondary | ICD-10-CM

## 2016-02-11 DIAGNOSIS — K2100 Gastro-esophageal reflux disease with esophagitis, without bleeding: Secondary | ICD-10-CM

## 2016-02-11 DIAGNOSIS — R062 Wheezing: Secondary | ICD-10-CM

## 2016-02-11 DIAGNOSIS — I700 Atherosclerosis of aorta: Secondary | ICD-10-CM | POA: Insufficient documentation

## 2016-02-11 DIAGNOSIS — R918 Other nonspecific abnormal finding of lung field: Secondary | ICD-10-CM | POA: Insufficient documentation

## 2016-02-11 DIAGNOSIS — R05 Cough: Secondary | ICD-10-CM | POA: Insufficient documentation

## 2016-02-11 DIAGNOSIS — Z23 Encounter for immunization: Secondary | ICD-10-CM

## 2016-02-11 DIAGNOSIS — M545 Low back pain: Secondary | ICD-10-CM

## 2016-02-11 DIAGNOSIS — R059 Cough, unspecified: Secondary | ICD-10-CM

## 2016-02-11 MED ORDER — FLUTICASONE PROPIONATE HFA 110 MCG/ACT IN AERO: 2.0000 | INHALATION_SPRAY | Freq: Two times a day (BID) | RESPIRATORY_TRACT | 5 refills | Status: DC

## 2016-02-11 MED ORDER — AZITHROMYCIN 250 MG PO TABS: ORAL_TABLET | ORAL | 0 refills | Status: DC

## 2016-02-11 MED ORDER — BENZONATATE 100 MG PO CAPS: 100.0000 mg | ORAL_CAPSULE | Freq: Three times a day (TID) | ORAL | 0 refills | Status: DC | PRN

## 2016-02-11 MED ORDER — LEVOFLOXACIN 500 MG PO TABS: 500.0000 mg | ORAL_TABLET | Freq: Every day | ORAL | 0 refills | Status: DC

## 2016-02-11 MED ORDER — LEVOCETIRIZINE DIHYDROCHLORIDE 5 MG PO TABS: 5.0000 mg | ORAL_TABLET | Freq: Every evening | ORAL | 0 refills | Status: DC

## 2016-02-11 NOTE — Patient Instructions (Addendum)
For your recent cough will get get chest xray. Will rx benzonatate for cough. Will send azithromycin antibiotic to take in event cxr positive or clinically symptoms worsen chest congestion/etc(as discussed).  Cough may be component of mild early seasonal allergies as you discussed get in fall. Will rx xyzal.   For wheezing on occasion will add flovent to use in conjunction with albuterol.  For back pain history you are awaiting call from neurosurgeon. You mentioned that will do localized steroid injection. I am willing to authorize brief holding of aspirin prior to procedure per their protocol. If they send me paperwork will sign off on brief continuation.  For hyperlipdemia. Continue zetia and zocor. Eat better/low cholesterol diet.  Flu vaccine to be given today.   Pt requested all meds be sent to mail order. He states he will get in 2-3 days. I wanted to send local but he is willing to wait citing cost is a lot less. But advised if xray positive then need to send local. He agreed. For your recent cough will get get chest xray. Will rx benzonatate for cough. Will send azithromycin antibiotic to take in event cxr positive or clinically symptoms worsen chest congestion/etc(as discussed).  Pt antibiotic kept printing although his mail order was pre-loaded. So I gave him print rx. Of z-pack. Will still try to send mail order. Asked lpn to work on this .But pt given print rx of antibiotic today.  Follow up 7-10 days or as needed

## 2016-02-11 NOTE — Telephone Encounter (Signed)
Rx levofloxin for early pneumonia. Shred azithromycin advised to pt. Asked lpn to cancel azithromycin mail order rx.   Pt will follow up in 7 days or as needed. Will see how he is and then decide on when to repeat chest xray.

## 2016-02-11 NOTE — Progress Notes (Signed)
Subjective:    Patient ID: Brian [name], male    DOB: April 10, 1946, 70 y.o.   MRN: [id number]  HPI   Pt in for for dry cough. Cough has been for a week. He states nothing coming. Cough keeping him up at night. No fevers, no chills or sweats with the cough. Pt has some wheezing at times when he coughs. Pt does have history of heart burn. Medication that he is currently on is adequate per his report(pt is on protonix).  Pt stable mild cardiomegaly on prior chest xray. No recent weigh gain or pedal edema.   In past for mild wheeze he states albuterol did not help.(very remote history of smoking. He quit in 1968)  Pt has some back pain. He is waiting on epidural injection.   History of hyperlipidemia. Pt is on zetia and simvistatin. Pt wants to stay on same dose med and improve diet.  Pt last psa in 2016. Was normal. No urinary obstruction. Pt is on flomax.  Pt is willing to get flu vaccine today.  Pt has made effort to loose weight. Has lost 4 pounds.      Review of Systems  Constitutional: Negative for chills, fatigue and fever.  HENT: Positive for postnasal drip. Negative for congestion, ear discharge, ear pain, hearing loss and rhinorrhea.        Occasional sneezing.  No eye itch.  Respiratory: Positive for cough and wheezing. Negative for chest tightness and shortness of breath.   Cardiovascular: Negative for chest pain and palpitations.  Gastrointestinal: Negative for abdominal pain, constipation and diarrhea.  Genitourinary: Negative for dysuria, flank pain and frequency.  Musculoskeletal: Negative for back pain.  Neurological: Negative for dizziness and headaches.  Hematological: Negative for adenopathy. Does not bruise/bleed easily.  Psychiatric/Behavioral: Negative for behavioral problems and confusion.    Past Medical History:  Diagnosis Date  . Atypical chest pain     Negative cardiac catheterization 2014  . 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 Blind right eye   .  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 Chicken pox   . Chronic back pain   . Dislocation, jaw 1960s  . Edema    Left Leg  . Essential hypertension, benign   . GERD (gastroesophageal reflux disease)   . [place] measles   . Heart attack ([place])   . Hyperlipidemia   . Measles   . Mumps   . Osteopenia 11.12.10   bone density test  . Personal history of other diseases of digestive system 08.10.12   Gastric ulcer     Social History   Social History  . Marital status: Single    Spouse name: N/A  . Number of children: N/A  . Years of education: N/A   Occupational History  . Not on file.   Social History Main Topics  . Smoking status: Former Smoker    Packs/day: 0.25    Years: 2.00    Types: Cigarettes    Quit date: 06/02/1966  . Smokeless tobacco: Never Used  . Alcohol use No  . Drug use: No  . Sexual activity: No   Other Topics Concern  . Not on file   Social History Narrative  . No narrative on file    Past Surgical History:  Procedure Laterality Date  . ABDOMINAL HERNIA REPAIR  1972  . APPENDECTOMY  1973  . CATARACT EXTRACTION, BILATERAL  2005  . ESOPHAGOGASTRODUODENOSCOPY  06.2011  . EYE SURGERY  1972   Bilateral Lens  Replacement  . FACIAL RECONSTRUCTION SURGERY  2008  . LEFT HEART CATH  02.17.2014  . [place], 2008   Dislocated Jaw  . PARS PLANA VITRECTOMY W/ REPAIR OF MACULAR HOLE  2005   macular hole repair, right eye  . PARS PLANA VITRECTOMY W/ REPAIR OF MACULAR HOLE  2010   mechanical, left eye  . PROSTATE BIOPSY  [place] 2017   w/ Dr. Nevada Crane  . WISDOM TOOTH EXTRACTION      Family History  Problem Relation Age of Onset  . Alzheimer's disease Mother     Deceased  . Diabetes Maternal Grandmother   . Heart attack Maternal Grandmother   . Prostate cancer Maternal Uncle   . Diabetes Maternal Uncle   . Healthy Son   . Cataracts Maternal Aunt     Allergies  Allergen Reactions  . Gabapentin Other (See Comments)     Feel "weird"; Nausea; Weakness; Syncope    Current Outpatient Prescriptions on File Prior to Visit  Medication Sig Dispense Refill  . aspirin (ASPIR-81) 81 MG EC tablet Take 1 tablet by mouth daily.    Marland Kitchen b complex vitamins tablet Take 1 tablet by mouth daily.    . Calcium Carbonate-Vitamin D (CALCIUM-VITAMIN D3 PO) Take by mouth daily.    Marland Kitchen ezetimibe (ZETIA) 10 MG tablet Take 1 tablet (10 mg total) by mouth daily. 30 tablet 2  . hydrochlorothiazide (MICROZIDE) 12.5 MG capsule Take 1 capsule (12.5 mg total) by mouth daily. 90 capsule 1  . mirabegron ER (MYRBETRIQ) 50 MG TB24 tablet Take 50 mg by mouth daily.    . pantoprazole (PROTONIX) 40 MG tablet Take 40 mg by mouth 2 (two) times daily.    . simvastatin (ZOCOR) 40 MG tablet TAKE 1 TABLET EVERY EVENING 90 tablet 1  . tamsulosin (FLOMAX) 0.4 MG CAPS capsule Take 1 capsule (0.4 mg total) by mouth daily. 90 capsule 1  . timolol (TIMOPTIC) 0.5 % ophthalmic solution Place 1 drop into the left eye daily. 10 mL 12  . albuterol (PROVENTIL HFA;VENTOLIN HFA) 108 (90 Base) MCG/ACT inhaler Inhale 2 puffs into the lungs every 6 (six) hours as needed for wheezing or shortness of breath. (Patient not taking: Reported on 02/11/2016) 1 Inhaler 0  . ranitidine (ZANTAC) 150 MG capsule Take 1 capsule (150 mg total) by mouth 2 (two) times daily. (Patient not taking: Reported on 01/21/2016) 60 capsule 0   No current facility-administered medications on file prior to visit.     BP 125/71   Pulse 67   Temp 98.2 F (36.8 C)   Ht 5' 5.25" (1.657 m)   Wt 186 lb 6.4 oz (84.6 kg)   SpO2 98%   BMI 30.78 kg/m       Objective:   Physical Exam  General  Mental Status - Alert. General Appearance - Well groomed. Not in acute distress.  Skin Rashes- No Rashes.  HEENT Head- Normal. Ear Auditory Canal - Left- Normal. Right - Normal.Tympanic Membrane- Left- Normal. Right- Normal. Eye Sclera/Conjunctiva- Left- Normal. Right- Normal. Nose & Sinuses Nasal  Mucosa- Left-  Boggy and Congested. Right-  Boggy and  Congested.Bilateral  No maxillary and no  frontal sinus pressure. Mouth & Throat Lips: Upper Lip- Normal: no dryness, cracking, pallor, cyanosis, or vesicular eruption. Lower Lip-Normal: no dryness, cracking, pallor, cyanosis or vesicular eruption. Buccal Mucosa- Bilateral- No Aphthous ulcers. Oropharynx- No Discharge or Erythema. +pnd. Tonsils: Characteristics- Bilateral- No Erythema or Congestion. Size/Enlargement- Bilateral- No enlargement. Discharge- bilateral-None.  Neck Neck- Supple. No Masses.   Chest and Lung Exam Auscultation: Breath Sounds:-Clear even and unlabored.  Cardiovascular Auscultation:Rythm- Regular, rate and rhythm. Murmurs & Other Heart Sounds:Ausculatation of the heart reveal- No Murmurs.  Lymphatic Head & Neck General Head & Neck Lymphatics: Bilateral: Description- No Localized lymphadenopathy.  Lower ext- no pedal edema. Negative homans sign.  Abdomen -soft, nt, nd, +bs,no rebound or guarding. No organomegaly.  Back- on laying supine today for above exam. No apparent pain. Movement fluid and do no look painful presently.         Assessment & Plan:  Pt requested all meds be sent to mail order. He states he will get in 2-3 days. I wanted to send local but he is willing to wait citing cost is a lot less. But advised if xray positive then need to send local. He agreed. For your recent cough will get get chest xray. Will rx benzonatate for cough. Will send azithromycin antibiotic to take in event cxr positive or clinically symptoms worsen chest congestion/etc(as discussed).  Cough may be component of mild early seasonal allergies as you discussed get in fall. Will rx xyzal.   For wheezing on occasion will add flovent to use in conjunction with albuterol.  For back pain history you are awaiting call from neurosurgeon. You mentioned that will do localized steroid injection. I am willing to authorize  brief holding of aspirin prior to procedure per theeir protocol. If they send me paperwork will sign off on brief continuation.  For hyperlipdemia. Continue zetia and zocor. Eat better/low cholesterol diet.  Flu vaccine to be given today.  Follow up 7-10 days or as needed  Stormee Duda, Percell Miller, [employer]

## 2016-02-13 NOTE — Telephone Encounter (Signed)
I have not seen form. I have been looking through folders almost every day and have not seen. So if we don't have maybe someone could call office and have form faxed over. Check my folders first.

## 2016-02-17 NOTE — Telephone Encounter (Signed)
Pt state neursurgeon has sent a form for me to fill out prior to his procedure. He last week called and stated they were waiting for me to sign. I have not seen this form. I will look thorough folders again. Help me out on Monday. We may need to call that office directly and get them to refax the forms.

## 2016-02-18 ENCOUNTER — Ambulatory Visit (INDEPENDENT_AMBULATORY_CARE_PROVIDER_SITE_OTHER): Payer: MEDICARE | Admitting: Medical

## 2016-02-18 ENCOUNTER — Encounter: Payer: Self-pay | Admitting: Medical

## 2016-02-18 ENCOUNTER — Telehealth: Payer: Self-pay | Admitting: Medical

## 2016-02-18 VITALS — BP 110/70 | HR 66 | Temp 98.2°F | Ht 65.0 in | Wt 184.6 lb

## 2016-02-18 DIAGNOSIS — R062 Wheezing: Secondary | ICD-10-CM | POA: Diagnosis not present

## 2016-02-18 DIAGNOSIS — J309 Allergic rhinitis, unspecified: Secondary | ICD-10-CM | POA: Diagnosis not present

## 2016-02-18 DIAGNOSIS — J189 Pneumonia, unspecified organism: Secondary | ICD-10-CM | POA: Diagnosis not present

## 2016-02-18 DIAGNOSIS — R6883 Chills (without fever): Secondary | ICD-10-CM

## 2016-02-18 DIAGNOSIS — R05 Cough: Secondary | ICD-10-CM | POA: Diagnosis not present

## 2016-02-18 DIAGNOSIS — R059 Cough, unspecified: Secondary | ICD-10-CM

## 2016-02-18 LAB — CBC WITH DIFFERENTIAL/PLATELET
BASOS ABS: 0 10*3/uL (ref 0.0–0.1)
Basophils Relative: 0.7 % (ref 0.0–3.0)
EOS PCT: 3.4 % (ref 0.0–5.0)
Eosinophils Absolute: 0.2 10*3/uL (ref 0.0–0.7)
HEMATOCRIT: 40.1 % (ref 39.0–52.0)
HEMOGLOBIN: 13.4 g/dL (ref 13.0–17.0)
LYMPHS PCT: 44.5 % (ref 12.0–46.0)
Lymphs Abs: 2.3 10*3/uL (ref 0.7–4.0)
MCHC: 33.3 g/dL (ref 30.0–36.0)
MCV: 82.9 fl (ref 78.0–100.0)
MONOS PCT: 10.7 % (ref 3.0–12.0)
Monocytes Absolute: 0.6 10*3/uL (ref 0.1–1.0)
NEUTROS PCT: 40.7 % — AB (ref 43.0–77.0)
Neutro Abs: 2.1 10*3/uL (ref 1.4–7.7)
Platelets: 353 10*3/uL (ref 150.0–400.0)
RBC: 4.84 Mil/uL (ref 4.22–5.81)
RDW: 12.9 % (ref 11.5–15.5)
WBC: 5.1 10*3/uL (ref 4.0–10.5)

## 2016-02-18 MED ORDER — LEVOFLOXACIN 500 MG PO TABS: 500.0000 mg | ORAL_TABLET | Freq: Every day | ORAL | 0 refills | Status: DC

## 2016-02-18 MED ORDER — BECLOMETHASONE DIPROPIONATE 40 MCG/ACT IN AERS: 2.0000 | INHALATION_SPRAY | Freq: Two times a day (BID) | RESPIRATORY_TRACT | 2 refills | Status: DC

## 2016-02-18 MED FILL — FLOVENT HFA 110 MCG INHALER: 110 | 30 days supply | Qty: 12 | Fill #0

## 2016-02-18 MED FILL — levoFLOXacin 500 MG TABS: 500 | 3 days supply | Qty: 3 | Fill #0

## 2016-02-18 NOTE — Telephone Encounter (Signed)
Pharmacy - [phone] called in. She says that the provider prescribed Qvar but pt's insurance will not cover. She says that they will cover [place] instead. She would like the okay to switch Rx to that med instead. Please advise.

## 2016-02-18 NOTE — Patient Instructions (Signed)
By last  chest xray and clinically concern for early pneumonia. Since some residual chills and cough will rx additional 3 days levofloxin and put cxr order in to repeat chest xray on this Thursday.  For wheezing some improved but still persisting will rx qvar. Send rx downstairs. Hopefully can find option less expensive than $116 which flovent cost??  For cough continue benzonatate.  For allergy like symptoms sneezing and pnd will continue xyzal.  Waiting on the pain management response to form request(if they want [place] to fill out anything or aurthorize dc of aspirin).  Follow up 7-10 days or as needed

## 2016-02-18 NOTE — Telephone Encounter (Signed)
Called Neuros office who states they will research and call me back as soon as they find out what paper could have been sent for. Stated it had been a while since patient had been seen there. States he could have possibly be comong for Epidural,would check and let me know.

## 2016-02-18 NOTE — Progress Notes (Signed)
Subjective:    Patient ID: Brian [name], male    DOB: 04/17/1946, 70 y.o.   MRN: [id number]  HPI  Pt in states he is still coughing some but not as much. He states just dry cough.   Pt has no wheezing. Pt feels some sweats when he is indoors with ac on. Some sweating of his scalp at time.  Pt has very history of smoking. He quite in 1969.  Pt had some lingular atelectasis verses early pneumnia on last cxr. Based on symptoms I had called in levofloxin.   I had also on last exam rx xyzal for allergy symptoms.  Pt had some mild wheezing. I had written him some flovent. Cost was $116. He feels like albuterol is helping some. Wheezing is less now.  Also regarding his upcoming epidural injection. Waiting on form to fill out. Per pt they wanted me to sign stating he could hold his asprin. Got LPN to call over there and ask them to send form so I can review.     Review of Systems  Constitutional: Positive for chills. Negative for fatigue and fever.  HENT: Positive for sneezing. Negative for congestion, facial swelling, mouth sores and rhinorrhea.   Respiratory: Positive for choking and wheezing. Negative for chest tightness.   Cardiovascular: Negative for chest pain and palpitations.  Musculoskeletal: Negative for back pain.  Skin: Negative for rash.  Neurological: Negative for dizziness, numbness and headaches.  Hematological: Negative for adenopathy. Does not bruise/bleed easily.  Psychiatric/Behavioral: Negative for behavioral problems, confusion and suicidal ideas.    Past Medical History:  Diagnosis Date  . Atypical chest pain     Negative cardiac catheterization 2014  . 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 Blind right eye   . 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 Chicken pox   . Chronic back pain   . Dislocation, jaw 1960s  . Edema    Left Leg  . Essential hypertension, benign   . GERD (gastroesophageal reflux disease)   . [place] measles   . Heart  attack ([place])   . Hyperlipidemia   . Measles   . Mumps   . Osteopenia 11.12.10   bone density test  . Personal history of other diseases of digestive system 08.10.12   Gastric ulcer     Social History   Social History  . Marital status: Single    Spouse name: N/A  . Number of children: N/A  . Years of education: N/A   Occupational History  . Not on file.   Social History Main Topics  . Smoking status: Former Smoker    Packs/day: 0.25    Years: 2.00    Types: Cigarettes    Quit date: 06/02/1966  . Smokeless tobacco: Never Used  . Alcohol use No  . Drug use: No  . Sexual activity: No   Other Topics Concern  . Not on file   Social History Narrative  . No narrative on file    Past Surgical History:  Procedure Laterality Date  . ABDOMINAL HERNIA REPAIR  1972  . APPENDECTOMY  1973  . CATARACT EXTRACTION, BILATERAL  2005  . ESOPHAGOGASTRODUODENOSCOPY  06.2011  . EYE SURGERY  1972   Bilateral Lens Replacement  . FACIAL RECONSTRUCTION SURGERY  2008  . LEFT HEART CATH  02.17.2014  . [place], 2008   Dislocated Jaw  . PARS PLANA VITRECTOMY W/ REPAIR OF MACULAR HOLE  2005   macular hole repair, right  eye  . PARS PLANA VITRECTOMY W/ REPAIR OF MACULAR HOLE  2010   mechanical, left eye  . PROSTATE BIOPSY  [place] 2017   w/ Dr. [place] Crane  . WISDOM TOOTH EXTRACTION      Family History  Problem Relation Age of Onset  . Alzheimer's disease Mother     Deceased  . Diabetes Maternal Grandmother   . Heart attack Maternal Grandmother   . Prostate cancer Maternal Uncle   . Diabetes Maternal Uncle   . Healthy Son   . Cataracts Maternal Aunt     Allergies  Allergen Reactions  . Gabapentin Other (See Comments)    Feel "weird"; Nausea; Weakness; Syncope    Current Outpatient Prescriptions on File Prior to Visit  Medication Sig Dispense Refill  . albuterol (PROVENTIL HFA;VENTOLIN HFA) 108 (90 Base) MCG/ACT inhaler Inhale 2 puffs into the lungs every 6 (six) hours  as needed for wheezing or shortness of breath. 1 Inhaler 0  . aspirin (ASPIR-81) 81 MG EC tablet Take 1 tablet by mouth daily.    Marland Kitchen b complex vitamins tablet Take 1 tablet by mouth daily.    . benzonatate (TESSALON) 100 MG capsule Take 1 capsule (100 mg total) by mouth 3 (three) times daily as needed for cough. 21 capsule 0  . Calcium Carbonate-Vitamin D (CALCIUM-VITAMIN D3 PO) Take by mouth daily.    Marland Kitchen ezetimibe (ZETIA) 10 MG tablet Take 1 tablet (10 mg total) by mouth daily. 30 tablet 2  . fluticasone (FLOVENT HFA) 110 MCG/ACT inhaler Inhale 2 puffs into the lungs 2 (two) times daily at 10 AM and 5 PM. 1 Inhaler 5  . hydrochlorothiazide (MICROZIDE) 12.5 MG capsule Take 1 capsule (12.5 mg total) by mouth daily. 90 capsule 1  . levocetirizine (XYZAL) 5 MG tablet Take 1 tablet (5 mg total) by mouth every evening. 90 tablet 0  . levofloxacin (LEVAQUIN) 500 MG tablet Take 1 tablet (500 mg total) by mouth daily. 7 tablet 0  . mirabegron ER (MYRBETRIQ) 50 MG TB24 tablet Take 50 mg by mouth daily.    . pantoprazole (PROTONIX) 40 MG tablet Take 40 mg by mouth 2 (two) times daily.    . ranitidine (ZANTAC) 150 MG capsule Take 1 capsule (150 mg total) by mouth 2 (two) times daily. 60 capsule 0  . simvastatin (ZOCOR) 40 MG tablet TAKE 1 TABLET EVERY EVENING 90 tablet 1  . tamsulosin (FLOMAX) 0.4 MG CAPS capsule Take 1 capsule (0.4 mg total) by mouth daily. 90 capsule 1  . timolol (TIMOPTIC) 0.5 % ophthalmic solution Place 1 drop into the left eye daily. 10 mL 12   No current facility-administered medications on file prior to visit.     BP 110/70   Pulse 66   Temp 98.2 F (36.8 C) (Oral)   Ht 5\' 5"  (1.651 m)   Wt 184 lb 9.6 oz (83.7 kg)   SpO2 98%   BMI 30.72 kg/m       Objective:   Physical Exam   General  Mental Status - Alert. General Appearance - Well groomed. Not in acute distress.  Skin Rashes- No Rashes.  HEENT Head- Normal. Ear Auditory Canal - Left- Normal. Right -  Normal.Tympanic Membrane- Left- Normal. Right- Normal. Eye Sclera/Conjunctiva- Left- Normal. Right- Normal. Nose & Sinuses Nasal Mucosa- Left-  Boggy and Congested. Right-  Boggy and  Congested.Bilateral no maxillary and no  frontal sinus pressure. Mouth & Throat Lips: Upper Lip- Normal: no dryness, cracking, pallor, cyanosis, or vesicular  eruption. Lower Lip-Normal: no dryness, cracking, pallor, cyanosis or vesicular eruption. Buccal Mucosa- Bilateral- No Aphthous ulcers. Oropharynx- No Discharge or Erythema. Tonsils: Characteristics- Bilateral- No Erythema or Congestion. Size/Enlargement- Bilateral- No enlargement. Discharge- bilateral-None.  Neck Neck- Supple. No Masses.   Chest and Lung Exam Auscultation: Breath Sounds:-Clear even and unlabored.  Cardiovascular Auscultation:Rythm- Regular, rate and rhythm. Murmurs & Other Heart Sounds:Ausculatation of the heart reveal- No Murmurs.  Lymphatic Head & Neck General Head & Neck Lymphatics: Bilateral: Description- No Localized lymphadenopathy.        Assessment & Plan:  By last  chest xray and clinically concern for early pneumonia. Since some residual chills and cough will rx additional 3 days levofloxin and put cxr order in to repeat chest xray on this Thursday.  For wheezing some improved but still persisting will rx qvar. Send rx downstairs. Hopefully can find option less expensive than $116 which flovent cost??  For cough continue benzonatate.  For allergy like symptoms sneezing and pnd will continue xyzal.  Waiting on the pain management response to form request(if they want [place] to fill out anything or aurthorize dc of aspirin).  Follow up 7-10 days or as needed

## 2016-02-21 ENCOUNTER — Ambulatory Visit (HOSPITAL_BASED_OUTPATIENT_CLINIC_OR_DEPARTMENT_OTHER)
Admission: RE | Admit: 2016-02-21 | Discharge: 2016-02-21 | Disposition: A | Discharge status: Home / Self Care | Payer: MEDICARE | Source: Ambulatory Visit | Attending: Medical | Admitting: Medical

## 2016-02-21 DIAGNOSIS — J189 Pneumonia, unspecified organism: Secondary | ICD-10-CM | POA: Diagnosis present

## 2016-02-21 DIAGNOSIS — I700 Atherosclerosis of aorta: Secondary | ICD-10-CM | POA: Diagnosis not present

## 2016-02-21 DIAGNOSIS — R062 Wheezing: Secondary | ICD-10-CM | POA: Diagnosis present

## 2016-02-21 DIAGNOSIS — R05 Cough: Secondary | ICD-10-CM | POA: Insufficient documentation

## 2016-02-21 DIAGNOSIS — R059 Cough, unspecified: Secondary | ICD-10-CM

## 2016-02-21 NOTE — Telephone Encounter (Signed)
Form received and forwarded to PCP for review and signature.

## 2016-02-22 ENCOUNTER — Telehealth: Payer: Self-pay | Admitting: Medical

## 2016-02-22 NOTE — Telephone Encounter (Signed)
Please advise 

## 2016-02-22 NOTE — Telephone Encounter (Signed)
Received Medication Hold Form from [facility]. Completed and faxed back to [phone]. Per office request.

## 2016-02-22 NOTE — Telephone Encounter (Signed)
Relation to [id number] Call back number:[phone]   Reason for call:  Patient inquiring about imaging results.

## 2016-02-22 NOTE — Telephone Encounter (Signed)
Patient transferred to [facility] 09/22

## 2016-02-22 NOTE — Telephone Encounter (Signed)
Filled out pt [facility] epidural infection form. 5 day hold on aspirin before surgery. Discussed with Dr. Charlett Blake.  For scanned. But also gave to LPN to fax over to Dr Nelta Numbers office.

## 2016-02-22 NOTE — Telephone Encounter (Signed)
Form is ready for fax to pain specialist. Place on your keyboard. Thanks,

## 2016-02-22 NOTE — Telephone Encounter (Signed)
Reviewed pt smoking history. Does not meet criteria for chest ct.

## 2016-02-22 NOTE — Telephone Encounter (Signed)
Form  Given to karen to fax

## 2016-02-25 NOTE — Telephone Encounter (Signed)
Faxed on 02/22/16

## 2016-02-26 ENCOUNTER — Ambulatory Visit (INDEPENDENT_AMBULATORY_CARE_PROVIDER_SITE_OTHER): Payer: MEDICARE | Admitting: Medical

## 2016-02-26 ENCOUNTER — Encounter: Payer: Self-pay | Admitting: Medical

## 2016-02-26 VITALS — BP 113/71 | HR 65 | Temp 98.1°F | Ht 65.0 in | Wt 187.0 lb

## 2016-02-26 DIAGNOSIS — R062 Wheezing: Secondary | ICD-10-CM | POA: Diagnosis not present

## 2016-02-26 DIAGNOSIS — J309 Allergic rhinitis, unspecified: Secondary | ICD-10-CM | POA: Diagnosis not present

## 2016-02-26 DIAGNOSIS — R059 Cough, unspecified: Secondary | ICD-10-CM

## 2016-02-26 DIAGNOSIS — R05 Cough: Secondary | ICD-10-CM | POA: Diagnosis not present

## 2016-02-26 DIAGNOSIS — Z1211 Encounter for screening for malignant neoplasm of colon: Secondary | ICD-10-CM

## 2016-02-26 NOTE — Progress Notes (Signed)
Subjective:    Patient ID: Brian [name], male    DOB: 02/20/1946, 70 y.o.   MRN: [id number]  HPI   Pt in feeling good today.  Pt used to smoke a long time ago. Stopped in 1968 for only short time. Pt had a lot of second hand smoke at his place of former employment when younger. Pt has no fever, no chills or sweats. Still faint residual cough.  Pt had pneumonia vaccines and flu vaccine.  Pt last cxr looked clear for any pneumonia. He is no longer wheezing. Pt used flovent for a couple of day.   Pt has no sneezing. At night some recent nasal congestion. Hx of that in the past.      Review of Systems  Constitutional: Negative for chills, fatigue and fever.  HENT: Positive for congestion. Negative for ear discharge, nosebleeds, postnasal drip, rhinorrhea, sinus pressure, sneezing and sore throat.   Respiratory: Positive for cough. Negative for chest tightness, shortness of breath and wheezing.        Residual occasional cough.  Cardiovascular: Negative for chest pain and palpitations.  Musculoskeletal: Negative for back pain.  Skin: Negative for rash.  Neurological: Negative for dizziness, syncope, speech difficulty, weakness, numbness and headaches.  Hematological: Negative for adenopathy. Does not bruise/bleed easily.  Psychiatric/Behavioral: Negative for behavioral problems and confusion.    Past Medical History:  Diagnosis Date  . Atypical chest pain     Negative cardiac catheterization 2014  . 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 Blind right eye   . 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 Chicken pox   . Chronic back pain   . Dislocation, jaw 1960s  . Edema    Left Leg  . Essential hypertension, benign   . GERD (gastroesophageal reflux disease)   . [place] measles   . Heart attack ([place])   . Hyperlipidemia   . Measles   . Mumps   . Osteopenia 11.12.10   bone density test  . Personal history of other diseases of digestive system 08.10.12   Gastric ulcer     Social History   Social History  . Marital status: Single    Spouse name: N/A  . Number of children: N/A  . Years of education: N/A   Occupational History  . Not on file.   Social History Main Topics  . Smoking status: Former Smoker    Packs/day: 0.25    Years: 2.00    Types: Cigarettes    Quit date: 06/02/1966  . Smokeless tobacco: Never Used  . Alcohol use No  . Drug use: No  . Sexual activity: No   Other Topics Concern  . Not on file   Social History Narrative  . No narrative on file    Past Surgical History:  Procedure Laterality Date  . ABDOMINAL HERNIA REPAIR  1972  . APPENDECTOMY  1973  . CATARACT EXTRACTION, BILATERAL  2005  . ESOPHAGOGASTRODUODENOSCOPY  06.2011  . EYE SURGERY  1972   Bilateral Lens Replacement  . FACIAL RECONSTRUCTION SURGERY  2008  . LEFT HEART CATH  02.17.2014  . [place], 2008   Dislocated Jaw  . PARS PLANA VITRECTOMY W/ REPAIR OF MACULAR HOLE  2005   macular hole repair, right eye  . PARS PLANA VITRECTOMY W/ REPAIR OF MACULAR HOLE  2010   mechanical, left eye  . PROSTATE BIOPSY  [place] 2017   w/ Dr. Nevada Crane  . WISDOM TOOTH EXTRACTION  Family History  Problem Relation Age of Onset  . Alzheimer's disease Mother     Deceased  . Diabetes Maternal Grandmother   . Heart attack Maternal Grandmother   . Prostate cancer Maternal Uncle   . Diabetes Maternal Uncle   . Healthy Son   . Cataracts Maternal Aunt     Allergies  Allergen Reactions  . Gabapentin Other (See Comments)    Feel "weird"; Nausea; Weakness; Syncope    Current Outpatient Prescriptions on File Prior to Visit  Medication Sig Dispense Refill  . albuterol (PROVENTIL HFA;VENTOLIN HFA) 108 (90 Base) MCG/ACT inhaler Inhale 2 puffs into the lungs every 6 (six) hours as needed for wheezing or shortness of breath. 1 Inhaler 0  . aspirin (ASPIR-81) 81 MG EC tablet Take 1 tablet by mouth daily.    Marland Kitchen b complex vitamins tablet Take 1 tablet  by mouth daily.    . benzonatate (TESSALON) 100 MG capsule Take 1 capsule (100 mg total) by mouth 3 (three) times daily as needed for cough. 21 capsule 0  . Calcium Carbonate-Vitamin D (CALCIUM-VITAMIN D3 PO) Take by mouth daily.    Marland Kitchen ezetimibe (ZETIA) 10 MG tablet Take 1 tablet (10 mg total) by mouth daily. 30 tablet 2  . fluticasone (FLOVENT HFA) 110 MCG/ACT inhaler Inhale 2 puffs into the lungs 2 (two) times daily at 10 AM and 5 PM. 1 Inhaler 5  . hydrochlorothiazide (MICROZIDE) 12.5 MG capsule Take 1 capsule (12.5 mg total) by mouth daily. 90 capsule 1  . levocetirizine (XYZAL) 5 MG tablet Take 1 tablet (5 mg total) by mouth every evening. 90 tablet 0  . mirabegron ER (MYRBETRIQ) 50 MG TB24 tablet Take 50 mg by mouth daily.    . pantoprazole (PROTONIX) 40 MG tablet Take 40 mg by mouth 2 (two) times daily.    . simvastatin (ZOCOR) 40 MG tablet TAKE 1 TABLET EVERY EVENING 90 tablet 1  . tamsulosin (FLOMAX) 0.4 MG CAPS capsule Take 1 capsule (0.4 mg total) by mouth daily. 90 capsule 1  . timolol (TIMOPTIC) 0.5 % ophthalmic solution Place 1 drop into the left eye daily. 10 mL 12  . beclomethasone (QVAR) 40 MCG/ACT inhaler Inhale 2 puffs into the lungs 2 times daily at 12 noon and 4 pm. (Patient not taking: Reported on 02/26/2016) 1 Inhaler 2  . ranitidine (ZANTAC) 150 MG capsule Take 1 capsule (150 mg total) by mouth 2 (two) times daily. (Patient not taking: Reported on 02/26/2016) 60 capsule 0   No current facility-administered medications on file prior to visit.     BP 113/71   Pulse 65   Temp 98.1 F (36.7 C) (Oral)   Ht 5\' 5"  (1.651 m)   Wt 187 lb (84.8 kg)   SpO2 100%   BMI 31.12 kg/m       Objective:   Physical Exam  General  Mental Status - Alert. General Appearance - Well groomed. Not in acute distress.  Skin Rashes- No Rashes.  HEENT Head- Normal. Ear Auditory Canal - Left- Normal. Right - Normal.Tympanic Membrane- Left- Normal. Right- Normal. Eye  Sclera/Conjunctiva- Left- Normal. Right- Normal. Nose & Sinuses Nasal Mucosa- Left-  Boggy and Congested. Right-  Boggy and Congested.  Bilateral no maxillary and no  frontal sinus pressure. Mouth & Throat Lips: Upper Lip- Normal: no dryness, cracking, pallor, cyanosis, or vesicular eruption. Lower Lip-Normal: no dryness, cracking, pallor, cyanosis or vesicular eruption. Buccal Mucosa- Bilateral- No Aphthous ulcers. Oropharynx- No Discharge or Erythema. Tonsils: Characteristics-  Bilateral- No Erythema or Congestion. Size/Enlargement- Bilateral- No enlargement. Discharge- bilateral-None.  Neck Neck- Supple. No Masses.   Chest and Lung Exam Auscultation: Breath Sounds:-Clear even and unlabored.  Cardiovascular Auscultation:Rythm- Regular, rate and rhythm. Murmurs & Other Heart Sounds:Ausculatation of the heart reveal- No Murmurs.  Lymphatic Head & Neck General Head & Neck Lymphatics: Bilateral: Description- No Localized lymphadenopathy.  Lower ext- no pedal edema. Negative homans signs.     Assessment & Plan:  Your  last chest chest xray does not show any pneumonia. I don't think any further antibiotics necessary at this point based on clinical presentation.  Your wheezing has resolved. It might be beneficial in light of occasional  coug(recent wheezing and hx of second hand smoke exposure) to use flovent daily for next month.  For possible allergies as cause of your residual cough would recommend possible use or xyzal. This would dry you up some and reduce pnd.  Follow up in 2 months or as needed    Gerado Nabers, Percell Miller, [employer]

## 2016-02-26 NOTE — Progress Notes (Signed)
Pre visit review using our clinic tool,if applicable. No additional management support is needed unless otherwise documented below in the visit note.  

## 2016-02-26 NOTE — Patient Instructions (Addendum)
Your  last chest chest xray does not show any pneumonia. I don't think any further antibiotics necessary at this point based on clinical presentation.  Your wheezing has resolved. It might be beneficial in light of occasional cough(recent wheezing and hx of second hand smoke exposure) to use flovent daily for next month.  For possible allergies as cause of your residual cough would recommend possible use or xyzal. This would dry you up some and reduce pnd.  Follow up in 2 months or as needed

## 2016-02-28 ENCOUNTER — Encounter: Payer: Self-pay | Admitting: Medical

## 2016-02-28 NOTE — Telephone Encounter (Signed)
Patient wanted to inform [facility] called patient to schedule appointment.

## 2016-02-28 NOTE — Progress Notes (Unsigned)
Received call from patient regarding form that has been faxed to [place] 2 times. We received faxed confirmations 2 times. Left message for LIsa whom patient told.me to ask for. Left verbal order on Lisa"s answering machine. Advised patient he may need to pick up order and take it to [facility] himself.

## 2016-02-28 NOTE — Telephone Encounter (Signed)
Refaxed to [phone]. Received fax confirmation as I did when faxed on 02/22/16.

## 2016-02-28 NOTE — Telephone Encounter (Signed)
Patient called and stated that [facility] did not receive the fax. Could you please refax?  Fax: [phone]

## 2016-03-04 ENCOUNTER — Other Ambulatory Visit: Payer: Self-pay | Admitting: Physician Assistant

## 2016-03-07 NOTE — Telephone Encounter (Signed)
Form signed by provider and faxed back.  Fax confirmation received.

## 2016-05-14 ENCOUNTER — Encounter: Payer: Self-pay | Admitting: Medical

## 2016-05-14 ENCOUNTER — Ambulatory Visit (INDEPENDENT_AMBULATORY_CARE_PROVIDER_SITE_OTHER): Payer: MEDICARE | Admitting: Medical

## 2016-05-14 ENCOUNTER — Ambulatory Visit (HOSPITAL_BASED_OUTPATIENT_CLINIC_OR_DEPARTMENT_OTHER)
Admission: RE | Admit: 2016-05-14 | Discharge: 2016-05-14 | Disposition: A | Discharge status: Home / Self Care | Payer: MEDICARE | Source: Ambulatory Visit | Attending: Medical | Admitting: Medical

## 2016-05-14 VITALS — BP 120/80 | HR 56 | Ht 65.0 in | Wt 186.0 lb

## 2016-05-14 DIAGNOSIS — M2550 Pain in unspecified joint: Secondary | ICD-10-CM | POA: Diagnosis not present

## 2016-05-14 DIAGNOSIS — M25511 Pain in right shoulder: Secondary | ICD-10-CM

## 2016-05-14 DIAGNOSIS — M25512 Pain in left shoulder: Secondary | ICD-10-CM | POA: Diagnosis not present

## 2016-05-14 DIAGNOSIS — R635 Abnormal weight gain: Secondary | ICD-10-CM

## 2016-05-14 MED ORDER — TRAMADOL HCL 50 MG PO TABS: 50.0000 mg | ORAL_TABLET | Freq: Four times a day (QID) | ORAL | 0 refills | Status: DC | PRN

## 2016-05-14 MED ORDER — KETOROLAC TROMETHAMINE 60 MG/2ML IM SOLN
60.0000 mg | Freq: Once | INTRAMUSCULAR | Status: AC
  Administered 2016-05-14: 60 mg via INTRAMUSCULAR

## 2016-05-14 NOTE — Progress Notes (Signed)
Pre visit review using our clinic review tool, if applicable. No additional management support is needed unless otherwise documented below in the visit note. 

## 2016-05-14 NOTE — Progress Notes (Signed)
Subjective:    Patient ID: Brian [name], male    DOB: [birth year], 70 y.o.   MRN: [id number]  HPI   Pt in for bilateral shoulder and knee pains. Pt states his left shoulder pain is the worse. Some lt  bicep pain recently. Bicep pain not related to lifting anything. But when lifts his  arm bicep hurts little bit. His joints feel still. Recently worse with cold weather and rain.  Knees hurt mostly going up and down step.  Pt can't exercise due to his knee pains. Not on any nsaids presently.  Pt has some lumbar back pain and has been seeing [facility] neurosurgery. They gave him epidural. He may get more. Back did feel better.   Pt wants to lose weight. Frustrated with his weight. No pedal edema or sob reported.  Though no dramatic weight gain. Just describes can't loose despite described moderate healthy diet.       Review of Systems  Constitutional: Negative for chills, fatigue and unexpected weight change.  Respiratory: Negative for cough, chest tightness, shortness of breath and wheezing.   Cardiovascular: Negative for chest pain and palpitations.  Musculoskeletal: Positive for arthralgias.       See hpi.  Skin: Negative for rash.  Neurological: Negative for dizziness and headaches.  Hematological: Negative for adenopathy. Does not bruise/bleed easily.  Psychiatric/Behavioral: Negative for agitation, behavioral problems and confusion.    Past Medical History:  Diagnosis Date  . Atypical chest pain     Negative cardiac catheterization 2014  . 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 Blind right eye   . 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 Chicken pox   . Chronic back pain   . Dislocation, jaw 1960s  . Edema    Left Leg  . Essential hypertension, benign   . GERD (gastroesophageal reflux disease)   . [place] measles   . Heart attack   . Hyperlipidemia   . Measles   . Mumps   . Osteopenia 11.12.10   bone density test  . Personal history of other diseases  of digestive system 08.10.12   Gastric ulcer     Social History   Social History  . Marital status: Single    Spouse name: N/A  . Number of children: N/A  . Years of education: N/A   Occupational History  . Not on file.   Social History Main Topics  . Smoking status: Former Smoker    Packs/day: 0.25    Years: 2.00    Types: Cigarettes    Quit date: 06/02/1966  . Smokeless tobacco: Never Used  . Alcohol use No  . Drug use: No  . Sexual activity: No   Other Topics Concern  . Not on file   Social History Narrative  . No narrative on file    Past Surgical History:  Procedure Laterality Date  . ABDOMINAL HERNIA REPAIR  1972  . APPENDECTOMY  1973  . CATARACT EXTRACTION, BILATERAL  2005  . ESOPHAGOGASTRODUODENOSCOPY  06.2011  . EYE SURGERY  1972   Bilateral Lens Replacement  . FACIAL RECONSTRUCTION SURGERY  2008  . LEFT HEART CATH  02.17.2014  . [place], 2008   Dislocated Jaw  . PARS PLANA VITRECTOMY W/ REPAIR OF MACULAR HOLE  2005   macular hole repair, right eye  . PARS PLANA VITRECTOMY W/ REPAIR OF MACULAR HOLE  2010   mechanical, left eye  . PROSTATE BIOPSY  [place] 2017   w/ Dr.  Hall  . WISDOM TOOTH EXTRACTION      Family History  Problem Relation Age of Onset  . Alzheimer's disease Mother     Deceased  . Diabetes Maternal Grandmother   . Heart attack Maternal Grandmother   . Prostate cancer Maternal Uncle   . Diabetes Maternal Uncle   . Healthy Son   . Cataracts Maternal Aunt     Allergies  Allergen Reactions  . Gabapentin Other (See Comments)    Feel "weird"; Nausea; Weakness; Syncope    Current Outpatient Prescriptions on File Prior to Visit  Medication Sig Dispense Refill  . albuterol (PROVENTIL HFA;VENTOLIN HFA) 108 (90 Base) MCG/ACT inhaler Inhale 2 puffs into the lungs every 6 (six) hours as needed for wheezing or shortness of breath. 1 Inhaler 0  . aspirin (ASPIR-81) 81 MG EC tablet Take 1 tablet by mouth daily.    Marland Kitchen b  complex vitamins tablet Take 1 tablet by mouth daily.    . beclomethasone (QVAR) 40 MCG/ACT inhaler Inhale 2 puffs into the lungs 2 times daily at 12 noon and 4 pm. 1 Inhaler 2  . Calcium Carbonate-Vitamin D (CALCIUM-VITAMIN D3 PO) Take by mouth daily.    Marland Kitchen ezetimibe (ZETIA) 10 MG tablet Take 1 tablet (10 mg total) by mouth daily. 30 tablet 2  . fluticasone (FLOVENT HFA) 110 MCG/ACT inhaler Inhale 2 puffs into the lungs 2 (two) times daily at 10 AM and 5 PM. 1 Inhaler 5  . hydrochlorothiazide (MICROZIDE) 12.5 MG capsule TAKE 1 CAPSULE EVERY DAY 90 capsule 1  . levocetirizine (XYZAL) 5 MG tablet Take 1 tablet (5 mg total) by mouth every evening. 90 tablet 0  . mirabegron ER (MYRBETRIQ) 50 MG TB24 tablet Take 50 mg by mouth daily.    . pantoprazole (PROTONIX) 40 MG tablet Take 40 mg by mouth 2 (two) times daily.    . ranitidine (ZANTAC) 150 MG capsule Take 1 capsule (150 mg total) by mouth 2 (two) times daily. 60 capsule 0  . simvastatin (ZOCOR) 40 MG tablet TAKE 1 TABLET EVERY EVENING 90 tablet 1  . tamsulosin (FLOMAX) 0.4 MG CAPS capsule Take 1 capsule (0.4 mg total) by mouth daily. 90 capsule 1  . timolol (TIMOPTIC) 0.5 % ophthalmic solution Place 1 drop into the left eye daily. 10 mL 12   No current facility-administered medications on file prior to visit.     BP 120/80 (BP Location: Left Arm, Patient Position: Sitting, Cuff Size: Normal)   Pulse (!) 56   Ht 5\' 5"  (1.651 m)   Wt 186 lb (84.4 kg)   SpO2 98%   BMI 30.95 kg/m       Objective:   Physical Exam  General- No acute distress. Pleasant patient. Neck- Full range of motion, no jvd Lungs- Clear, even and unlabored. Heart- regular rate and rhythm. Neurologic- CNII- XII grossly intact.  Knees- no crepitus. Full range of motion. No instability. No crepitus. No swelling.  Lt shoulder- pain on rom. Pain when he brings left upper ext to shoulder level. Pain direct of bicep tendon on palpation. Bicep hurts at same time bicep  tendon area hurts. No bicep swelling. Normal shape to bicep.  Rt shoulder- good rom. No pain today.        Assessment & Plan:  For your shoulder and knee pains we gave you toradol 60 mg im.  You can use low dose ibupfrofen otc starting tomorrow if needed.  I rx'd tramadol for more severe pain.  Will  refer you to sports medicine to evaluate rt shoulder/tender bicep tendon area.  Will get ra panel today. Include tsh due to your reported weight gain.   Follow up in 2 weeks or as needed

## 2016-05-14 NOTE — Patient Instructions (Addendum)
For your shoulder and knee pains we gave you toradol 60 mg im.  You can use low dose ibupfrofen otc starting tomorrow if needed.  I rx'd  tramadol for more severe pain.  Will refer you to sports medicine to evaluate rt shoulder/tender bicep tendon area.  Will get ra panel today. Include tsh due to your reported weight gain.   Follow up in 2 weeks or as needed

## 2016-05-16 ENCOUNTER — Telehealth: Payer: Self-pay | Admitting: Medical

## 2016-05-16 ENCOUNTER — Encounter: Payer: Self-pay | Admitting: Family Medicine

## 2016-05-16 ENCOUNTER — Ambulatory Visit (INDEPENDENT_AMBULATORY_CARE_PROVIDER_SITE_OTHER): Payer: MEDICARE | Admitting: Family Medicine

## 2016-05-16 DIAGNOSIS — M25512 Pain in left shoulder: Secondary | ICD-10-CM

## 2016-05-16 DIAGNOSIS — G8929 Other chronic pain: Secondary | ICD-10-CM | POA: Diagnosis not present

## 2016-05-16 DIAGNOSIS — R3911 Hesitancy of micturition: Secondary | ICD-10-CM

## 2016-05-16 MED ORDER — METHYLPREDNISOLONE ACETATE 40 MG/ML IJ SUSP
40.0000 mg | Freq: Once | INTRAMUSCULAR | Status: AC
  Administered 2016-05-16: 40 mg via INTRA_ARTICULAR

## 2016-05-16 NOTE — Telephone Encounter (Signed)
Patient is calling to request a refill of tamsulosin (FLOMAX) 0.4 MG CAPS capsule Please advise   Pharmacy: [place], [place]

## 2016-05-16 NOTE — Patient Instructions (Signed)
You have rotator cuff impingement Try to avoid painful activities (overhead activities, lifting with extended arm) as much as possible. Consider aleve 2 tabs twice a day with food OR ibuprofen 3 tabs three times a day with food for pain and inflammation. Can take tylenol in addition to this. Subacromial injection may be beneficial to help with pain and to decrease inflammation - you were given this today. Consider physical therapy with transition to home exercise program. Do home exercise program with theraband and scapular stabilization exercises daily - these are very important for long term relief even if an injection was given.  3 sets of 10 once a day. If not improving at follow-up we will consider imaging, physical therapy, and/or nitro patches. Follow up with me in 6 weeks.

## 2016-05-19 DIAGNOSIS — M25512 Pain in left shoulder: Secondary | ICD-10-CM | POA: Insufficient documentation

## 2016-05-19 MED ORDER — TAMSULOSIN HCL 0.4 MG PO CAPS: 0.4000 mg | ORAL_CAPSULE | Freq: Every day | ORAL | 1 refills | Status: DC

## 2016-05-19 NOTE — Addendum Note (Signed)
Addended by: Tasia Catchings on: 05/19/2016 02:36 PM   Modules accepted: Orders

## 2016-05-19 NOTE — Assessment & Plan Note (Signed)
2/2 rotator cuff impingement.  Given subacromial injection today.  Shown home exercises to do daily.  Consider nsaids.  Tylenol as needed.  Consider imaging, PT, nitro patches if not improving.  F/u in 6 weeks.  After informed written consent, patient was seated on exam table. Left shoulder was prepped with alcohol swab and utilizing posterior approach, patient's left subacromial space was injected with 3:1 marcaine: depomedrol. Patient tolerated the procedure well without immediate complications.

## 2016-05-19 NOTE — Progress Notes (Signed)
PCP and consultation requested by: Mackie Pai, PA-C  Subjective:   HPI: Patient is a 70 y.o. male here for left shoulder pain.  Patient reports for about 6 months he's had lateral left shoulder pain. Pain worse with movement. Up to 5/10 and sharp. Hard to reach behind his back. Has worsened past 2 weeks also. No night pain. Has not tried any treatment leading up to toradol injection from [facility] Wednesday. No skin changes, numbness.  Past Medical History:  Diagnosis Date  . Atypical chest pain     Negative cardiac catheterization 2014  . 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 Blind right eye   . 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 Chicken pox   . Chronic back pain   . Dislocation, jaw 1960s  . Edema    Left Leg  . Essential hypertension, benign   . GERD (gastroesophageal reflux disease)   . [place] measles   . Heart attack   . Hyperlipidemia   . Measles   . Mumps   . Osteopenia 11.12.10   bone density test  . Personal history of other diseases of digestive system 08.10.12   Gastric ulcer    Current Outpatient Prescriptions on File Prior to Visit  Medication Sig Dispense Refill  . albuterol (PROVENTIL HFA;VENTOLIN HFA) 108 (90 Base) MCG/ACT inhaler Inhale 2 puffs into the lungs every 6 (six) hours as needed for wheezing or shortness of breath. 1 Inhaler 0  . aspirin (ASPIR-81) 81 MG EC tablet Take 1 tablet by mouth daily.    Marland Kitchen b complex vitamins tablet Take 1 tablet by mouth daily.    . beclomethasone (QVAR) 40 MCG/ACT inhaler Inhale 2 puffs into the lungs 2 times daily at 12 noon and 4 pm. 1 Inhaler 2  . Calcium Carbonate-Vitamin D (CALCIUM-VITAMIN D3 PO) Take by mouth daily.    Marland Kitchen ezetimibe (ZETIA) 10 MG tablet Take 1 tablet (10 mg total) by mouth daily. 30 tablet 2  . fluticasone (FLOVENT HFA) 110 MCG/ACT inhaler Inhale 2 puffs into the lungs 2 (two) times daily at 10 AM and 5 PM. 1 Inhaler 5  . hydrochlorothiazide (MICROZIDE) 12.5 MG  capsule TAKE 1 CAPSULE EVERY DAY 90 capsule 1  . levocetirizine (XYZAL) 5 MG tablet Take 1 tablet (5 mg total) by mouth every evening. 90 tablet 0  . mirabegron ER (MYRBETRIQ) 50 MG TB24 tablet Take 50 mg by mouth daily.    . pantoprazole (PROTONIX) 40 MG tablet Take 40 mg by mouth 2 (two) times daily.    . ranitidine (ZANTAC) 150 MG capsule Take 1 capsule (150 mg total) by mouth 2 (two) times daily. 60 capsule 0  . simvastatin (ZOCOR) 40 MG tablet TAKE 1 TABLET EVERY EVENING 90 tablet 1  . tamsulosin (FLOMAX) 0.4 MG CAPS capsule Take 1 capsule (0.4 mg total) by mouth daily. 90 capsule 1  . timolol (TIMOPTIC) 0.5 % ophthalmic solution Place 1 drop into the left eye daily. 10 mL 12  . traMADol (ULTRAM) 50 MG tablet Take 1 tablet (50 mg total) by mouth every 6 (six) hours as needed for severe pain. 12 tablet 0   No current facility-administered medications on file prior to visit.     Past Surgical History:  Procedure Laterality Date  . ABDOMINAL HERNIA REPAIR  1972  . APPENDECTOMY  1973  . CATARACT EXTRACTION, BILATERAL  2005  . ESOPHAGOGASTRODUODENOSCOPY  06.2011  . EYE SURGERY  1972   Bilateral Lens Replacement  . FACIAL RECONSTRUCTION  SURGERY  2008  . LEFT HEART CATH  02.17.2014  . [place], 2008   Dislocated Jaw  . PARS PLANA VITRECTOMY W/ REPAIR OF MACULAR HOLE  2005   macular hole repair, right eye  . PARS PLANA VITRECTOMY W/ REPAIR OF MACULAR HOLE  2010   mechanical, left eye  . PROSTATE BIOPSY  [place] 2017   w/ Dr. [place] Crane  . WISDOM TOOTH EXTRACTION      Allergies  Allergen Reactions  . Gabapentin Other (See Comments)    Feel "weird"; Nausea; Weakness; Syncope    Social History   Social History  . Marital status: Single    Spouse name: N/A  . Number of children: N/A  . Years of education: N/A   Occupational History  . Not on file.   Social History Main Topics  . Smoking status: Former Smoker    Packs/day: 0.25    Years: 2.00    Types: Cigarettes     Quit date: 06/02/1966  . Smokeless tobacco: Never Used  . Alcohol use No  . Drug use: No  . Sexual activity: No   Other Topics Concern  . Not on file   Social History Narrative  . No narrative on file    Family History  Problem Relation Age of Onset  . Alzheimer's disease Mother     Deceased  . Diabetes Maternal Grandmother   . Heart attack Maternal Grandmother   . Prostate cancer Maternal Uncle   . Diabetes Maternal Uncle   . Healthy Son   . Cataracts Maternal Aunt     BP 126/80   Pulse (!) 48   Ht 5\' 6"  (1.676 m)   Wt 186 lb (84.4 kg)   BMI 30.02 kg/m   Review of Systems: See HPI above.     Objective:  Physical Exam:  Gen: NAD, comfortable in exam room  Left shoulder: No swelling, ecchymoses.  No gross deformity. No TTP. FROM with painful arc. Positive Hawkins, Neers. Negative Yergasons. Strength 5/5 with empty can and resisted internal/external rotation.  Pain empty can, less ER. Negative apprehension. NV intact distally.  Right shoulder: FROM without pain.   Assessment & Plan:  1. Left shoulder pain - 2/2 rotator cuff impingement.  Given subacromial injection today.  Shown home exercises to do daily.  Consider nsaids.  Tylenol as needed.  Consider imaging, PT, nitro patches if not improving.  F/u in 6 weeks.  After informed written consent, patient was seated on exam table. Left shoulder was prepped with alcohol swab and utilizing posterior approach, patient's left subacromial space was injected with 3:1 marcaine: depomedrol. Patient tolerated the procedure well without immediate complications.

## 2016-05-27 ENCOUNTER — Ambulatory Visit (HOSPITAL_BASED_OUTPATIENT_CLINIC_OR_DEPARTMENT_OTHER)
Admission: RE | Admit: 2016-05-27 | Discharge: 2016-05-27 | Disposition: A | Discharge status: Home / Self Care | Payer: MEDICARE | Source: Ambulatory Visit | Attending: Medical | Admitting: Medical

## 2016-05-27 ENCOUNTER — Encounter: Payer: Self-pay | Admitting: Medical

## 2016-05-27 ENCOUNTER — Ambulatory Visit (INDEPENDENT_AMBULATORY_CARE_PROVIDER_SITE_OTHER): Payer: MEDICARE | Admitting: Medical

## 2016-05-27 VITALS — BP 107/72 | HR 61 | Temp 98.9°F | Ht 66.0 in | Wt 192.8 lb

## 2016-05-27 DIAGNOSIS — R05 Cough: Secondary | ICD-10-CM | POA: Insufficient documentation

## 2016-05-27 DIAGNOSIS — R5383 Other fatigue: Secondary | ICD-10-CM

## 2016-05-27 DIAGNOSIS — R059 Cough, unspecified: Secondary | ICD-10-CM

## 2016-05-27 DIAGNOSIS — J209 Acute bronchitis, unspecified: Secondary | ICD-10-CM | POA: Insufficient documentation

## 2016-05-27 LAB — CBC WITH DIFFERENTIAL/PLATELET
BASOS PCT: 0.6 % (ref 0.0–3.0)
Basophils Absolute: 0 10*3/uL (ref 0.0–0.1)
EOS ABS: 0.2 10*3/uL (ref 0.0–0.7)
Eosinophils Relative: 3.8 % (ref 0.0–5.0)
HCT: 41.6 % (ref 39.0–52.0)
HEMOGLOBIN: 14.2 g/dL (ref 13.0–17.0)
Lymphocytes Relative: 38.6 % (ref 12.0–46.0)
Lymphs Abs: 2 10*3/uL (ref 0.7–4.0)
MCHC: 34.3 g/dL (ref 30.0–36.0)
MCV: 86 fl (ref 78.0–100.0)
MONO ABS: 0.7 10*3/uL (ref 0.1–1.0)
Monocytes Relative: 14.2 % — ABNORMAL HIGH (ref 3.0–12.0)
Neutro Abs: 2.2 10*3/uL (ref 1.4–7.7)
Neutrophils Relative %: 42.8 % — ABNORMAL LOW (ref 43.0–77.0)
Platelets: 361 10*3/uL (ref 150.0–400.0)
RBC: 4.83 Mil/uL (ref 4.22–5.81)
RDW: 13.1 % (ref 11.5–15.5)
WBC: 5.2 10*3/uL (ref 4.0–10.5)

## 2016-05-27 LAB — T4, FREE: FREE T4: 0.8 ng/dL (ref 0.60–1.60)

## 2016-05-27 LAB — TSH: TSH: 1.9 u[IU]/mL (ref 0.35–4.50)

## 2016-05-27 MED ORDER — DOXYCYCLINE HYCLATE 100 MG PO TABS: 100.0000 mg | ORAL_TABLET | Freq: Two times a day (BID) | ORAL | 0 refills | Status: DC

## 2016-05-27 MED ORDER — HYDROCODONE-HOMATROPINE 5-1.5 MG/5ML PO SYRP: 5.0000 mL | ORAL_SOLUTION | Freq: Three times a day (TID) | ORAL | 0 refills | Status: DC | PRN

## 2016-05-27 MED FILL — HYDROCODONE-HOMATROPINE SOL: 5-1.5 | 3 days supply | Qty: 120 | Fill #0

## 2016-05-27 MED FILL — DOXYCYCLINE HYCLATE 100 MG: 100 | 10 days supply | Qty: 20 | Fill #0

## 2016-05-27 NOTE — Progress Notes (Signed)
Pre visit review using our clinic review tool, if applicable. No additional management support is needed unless otherwise documented below in the visit note. 

## 2016-05-27 NOTE — Progress Notes (Signed)
Subjective:    Patient ID: Brian [name], male    DOB: Jun 21, 1945, 70 y.o.   MRN: [id number]  HPI  Pt in feeling sick since last week Saturday with hoarse voice following by cough, chills and some lower back pain. He points to lower lobe. This was about 10 days ago. Pt states he is not recovering as he typically does. No sinus pressure. And no ear pain.  He states can sleep due to cough.   Review of Systems  Constitutional: Positive for fatigue. Negative for chills and fever.       Weight gain. He reminds me of this complaint. He mentioned this on last visit. Describes gradual weight gain.   HENT: Negative for congestion, postnasal drip, rhinorrhea, sinus pain and sneezing.   Respiratory: Positive for cough. Negative for shortness of breath and wheezing.   Cardiovascular: Negative for chest pain and palpitations.  Gastrointestinal: Negative for abdominal pain.  Musculoskeletal: Negative for back pain.       Some pain posterior thorax. Points to lower lobe region.  Skin: Negative for pallor and rash.  Neurological: Negative for dizziness, speech difficulty, weakness and headaches.  Hematological: Negative for adenopathy. Does not bruise/bleed easily.  Psychiatric/Behavioral: Negative for behavioral problems and confusion. The patient is not nervous/anxious.     Past Medical History:  Diagnosis Date  . Atypical chest pain     Negative cardiac catheterization 2014  . 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 Blind right eye   . 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 Chicken pox   . Chronic back pain   . Dislocation, jaw 1960s  . Edema    Left Leg  . Essential hypertension, benign   . GERD (gastroesophageal reflux disease)   . [place] measles   . Heart attack   . Hyperlipidemia   . Measles   . Mumps   . Osteopenia 11.12.10   bone density test  . Personal history of other diseases of digestive system 08.10.12   Gastric ulcer     Social History   Social  History  . Marital status: Single    Spouse name: N/A  . Number of children: N/A  . Years of education: N/A   Occupational History  . Not on file.   Social History Main Topics  . Smoking status: Former Smoker    Packs/day: 0.25    Years: 2.00    Types: Cigarettes    Quit date: 06/02/1966  . Smokeless tobacco: Never Used  . Alcohol use No  . Drug use: No  . Sexual activity: No   Other Topics Concern  . Not on file   Social History Narrative  . No narrative on file    Past Surgical History:  Procedure Laterality Date  . ABDOMINAL HERNIA REPAIR  1972  . APPENDECTOMY  1973  . CATARACT EXTRACTION, BILATERAL  2005  . ESOPHAGOGASTRODUODENOSCOPY  06.2011  . EYE SURGERY  1972   Bilateral Lens Replacement  . FACIAL RECONSTRUCTION SURGERY  2008  . LEFT HEART CATH  02.17.2014  . [place], 2008   Dislocated Jaw  . PARS PLANA VITRECTOMY W/ REPAIR OF MACULAR HOLE  2005   macular hole repair, right eye  . PARS PLANA VITRECTOMY W/ REPAIR OF MACULAR HOLE  2010   mechanical, left eye  . PROSTATE BIOPSY  Jul[place] 2017   w/ Dr. Nevada Crane  . WISDOM TOOTH EXTRACTION      Family History  Problem Relation Age of  Onset  . Alzheimer's disease Mother     Deceased  . Diabetes Maternal Grandmother   . Heart attack Maternal Grandmother   . Prostate cancer Maternal Uncle   . Diabetes Maternal Uncle   . Healthy Son   . Cataracts Maternal Aunt     Allergies  Allergen Reactions  . Gabapentin Other (See Comments)    Feel "weird"; Nausea; Weakness; Syncope    Current Outpatient Prescriptions on File Prior to Visit  Medication Sig Dispense Refill  . albuterol (PROVENTIL HFA;VENTOLIN HFA) 108 (90 Base) MCG/ACT inhaler Inhale 2 puffs into the lungs every 6 (six) hours as needed for wheezing or shortness of breath. 1 Inhaler 0  . aspirin (ASPIR-81) 81 MG EC tablet Take 1 tablet by mouth daily.    Marland Kitchen b complex vitamins tablet Take 1 tablet by mouth daily.    . beclomethasone (QVAR) 40  MCG/ACT inhaler Inhale 2 puffs into the lungs 2 times daily at 12 noon and 4 pm. 1 Inhaler 2  . Calcium Carbonate-Vitamin D (CALCIUM-VITAMIN D3 PO) Take by mouth daily.    Marland Kitchen ezetimibe (ZETIA) 10 MG tablet Take 1 tablet (10 mg total) by mouth daily. 30 tablet 2  . fluticasone (FLOVENT HFA) 110 MCG/ACT inhaler Inhale 2 puffs into the lungs 2 (two) times daily at 10 AM and 5 PM. 1 Inhaler 5  . hydrochlorothiazide (MICROZIDE) 12.5 MG capsule TAKE 1 CAPSULE EVERY DAY 90 capsule 1  . levocetirizine (XYZAL) 5 MG tablet Take 1 tablet (5 mg total) by mouth every evening. 90 tablet 0  . mirabegron ER (MYRBETRIQ) 50 MG TB24 tablet Take 50 mg by mouth daily.    . pantoprazole (PROTONIX) 40 MG tablet Take 40 mg by mouth 2 (two) times daily.    . ranitidine (ZANTAC) 150 MG capsule Take 1 capsule (150 mg total) by mouth 2 (two) times daily. 60 capsule 0  . simvastatin (ZOCOR) 40 MG tablet TAKE 1 TABLET EVERY EVENING 90 tablet 1  . tamsulosin (FLOMAX) 0.4 MG CAPS capsule Take 1 capsule (0.4 mg total) by mouth daily. 90 capsule 1  . timolol (TIMOPTIC) 0.5 % ophthalmic solution Place 1 drop into the left eye daily. 10 mL 12  . traMADol (ULTRAM) 50 MG tablet Take 1 tablet (50 mg total) by mouth every 6 (six) hours as needed for severe pain. 12 tablet 0   No current facility-administered medications on file prior to visit.     BP 107/72 (BP Location: Left Arm, Patient Position: Sitting, Cuff Size: Large)   Pulse 61   Temp 98.9 F (37.2 C) (Oral)   Ht 5\' 6"  (1.676 m)   Wt 192 lb 12.8 oz (87.5 kg)   SpO2 95%   BMI 31.12 kg/m    Past Medical History:  Diagnosis Date  . Atypical chest pain     Negative cardiac catheterization 2014  . 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 Blind right eye   . 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 Chicken pox   . Chronic back pain   . Dislocation, jaw 1960s  . Edema    Left Leg  . Essential hypertension, benign   . GERD (gastroesophageal reflux  disease)   . [place] measles   . Heart attack   . Hyperlipidemia   . Measles   . Mumps   . Osteopenia 11.12.10   bone density test  . Personal history of other diseases of digestive system 08.10.12   Gastric ulcer  Social History   Social History  . Marital status: Single    Spouse name: N/A  . Number of children: N/A  . Years of education: N/A   Occupational History  . Not on file.   Social History Main Topics  . Smoking status: Former Smoker    Packs/day: 0.25    Years: 2.00    Types: Cigarettes    Quit date: 06/02/1966  . Smokeless tobacco: Never Used  . Alcohol use No  . Drug use: No  . Sexual activity: No   Other Topics Concern  . Not on file   Social History Narrative  . No narrative on file    Past Surgical History:  Procedure Laterality Date  . ABDOMINAL HERNIA REPAIR  1972  . APPENDECTOMY  1973  . CATARACT EXTRACTION, BILATERAL  2005  . ESOPHAGOGASTRODUODENOSCOPY  06.2011  . EYE SURGERY  1972   Bilateral Lens Replacement  . FACIAL RECONSTRUCTION SURGERY  2008  . LEFT HEART CATH  02.17.2014  . [place], 2008   Dislocated Jaw  . PARS PLANA VITRECTOMY W/ REPAIR OF MACULAR HOLE  2005   macular hole repair, right eye  . PARS PLANA VITRECTOMY W/ REPAIR OF MACULAR HOLE  2010   mechanical, left eye  . PROSTATE BIOPSY  Jul[place] 2017   w/ Dr. [place] Crane  . WISDOM TOOTH EXTRACTION      Family History  Problem Relation Age of Onset  . Alzheimer's disease Mother     Deceased  . Diabetes Maternal Grandmother   . Heart attack Maternal Grandmother   . Prostate cancer Maternal Uncle   . Diabetes Maternal Uncle   . Healthy Son   . Cataracts Maternal Aunt     Allergies  Allergen Reactions  . Gabapentin Other (See Comments)    Feel "weird"; Nausea; Weakness; Syncope    Current Outpatient Prescriptions on File Prior to Visit  Medication Sig Dispense Refill  . albuterol (PROVENTIL HFA;VENTOLIN HFA) 108 (90 Base) MCG/ACT inhaler Inhale 2 puffs  into the lungs every 6 (six) hours as needed for wheezing or shortness of breath. 1 Inhaler 0  . aspirin (ASPIR-81) 81 MG EC tablet Take 1 tablet by mouth daily.    Marland Kitchen b complex vitamins tablet Take 1 tablet by mouth daily.    . beclomethasone (QVAR) 40 MCG/ACT inhaler Inhale 2 puffs into the lungs 2 times daily at 12 noon and 4 pm. 1 Inhaler 2  . Calcium Carbonate-Vitamin D (CALCIUM-VITAMIN D3 PO) Take by mouth daily.    Marland Kitchen ezetimibe (ZETIA) 10 MG tablet Take 1 tablet (10 mg total) by mouth daily. 30 tablet 2  . fluticasone (FLOVENT HFA) 110 MCG/ACT inhaler Inhale 2 puffs into the lungs 2 (two) times daily at 10 AM and 5 PM. 1 Inhaler 5  . hydrochlorothiazide (MICROZIDE) 12.5 MG capsule TAKE 1 CAPSULE EVERY DAY 90 capsule 1  . levocetirizine (XYZAL) 5 MG tablet Take 1 tablet (5 mg total) by mouth every evening. 90 tablet 0  . mirabegron ER (MYRBETRIQ) 50 MG TB24 tablet Take 50 mg by mouth daily.    . pantoprazole (PROTONIX) 40 MG tablet Take 40 mg by mouth 2 (two) times daily.    . ranitidine (ZANTAC) 150 MG capsule Take 1 capsule (150 mg total) by mouth 2 (two) times daily. 60 capsule 0  . simvastatin (ZOCOR) 40 MG tablet TAKE 1 TABLET EVERY EVENING 90 tablet 1  . tamsulosin (FLOMAX) 0.4 MG CAPS capsule Take 1 capsule (0.4  mg total) by mouth daily. 90 capsule 1  . timolol (TIMOPTIC) 0.5 % ophthalmic solution Place 1 drop into the left eye daily. 10 mL 12  . traMADol (ULTRAM) 50 MG tablet Take 1 tablet (50 mg total) by mouth every 6 (six) hours as needed for severe pain. 12 tablet 0   No current facility-administered medications on file prior to visit.     BP 107/72 (BP Location: Left Arm, Patient Position: Sitting, Cuff Size: Large)   Pulse 61   Temp 98.9 F (37.2 C) (Oral)   Ht 5\' 6"  (1.676 m)   Wt 192 lb 12.8 oz (87.5 kg)   SpO2 95%   BMI 31.12 kg/m       Objective:   Physical Exam  General  Mental Status - Alert. General Appearance - Well groomed. Not in acute  distress.  Skin Rashes- No Rashes.  HEENT Head- Normal. Ear Auditory Canal - Left- Normal. Right - Normal.Tympanic Membrane- Left- Normal. Right- Normal. Eye Sclera/Conjunctiva- Left- Normal. Right- Normal. Nose & Sinuses Nasal Mucosa- Left-  Boggy and Congested. Right-  Boggy and  Congested.Bilateral no maxillary and  No frontal sinus pressure. Mouth & Throat Lips: Upper Lip- Normal: no dryness, cracking, pallor, cyanosis, or vesicular eruption. Lower Lip-Normal: no dryness, cracking, pallor, cyanosis or vesicular eruption. Buccal Mucosa- Bilateral- No Aphthous ulcers. Oropharynx- No Discharge or Erythema. Tonsils: Characteristics- Bilateral- No Erythema or Congestion. Size/Enlargement- Bilateral- No enlargement. Discharge- bilateral-None.  Neck Neck- Supple. No Masses.  Chest and Lung Exam Auscultation: Breath Sounds:-Clear even and unlabored. Very faint possible rough breath sounds at lung bases.  Cardiovascular Auscultation:Rythm- Regular, rate and rhythm. Murmurs & Other Heart Sounds:Ausculatation of the heart reveal- No Murmurs.  Lymphatic Head & Neck General Head & Neck Lymphatics: Bilateral: Description- No Localized lymphadenopathy.  Lower ext- no pedal edema.       Assessment & Plan:  You appear to have bronchitis. Rest hydrate and tylenol for fever. I am prescribing cough medicine hycodan, and doxycycline antibiotic.   Since your have this for 10 days and some suspicion for pneumonia will get cxr and cbc.  For your current fatigue and recent weight gain complaints include thyroid studies today.  Follow up in 7-10 days or as needed

## 2016-05-27 NOTE — Patient Instructions (Addendum)
You appear to have bronchitis. Rest hydrate and tylenol for fever. I am prescribing cough medicine hycodan, and doxycycline antibiotic.   Since your have this for 10 days and some suspicion for pneumonia will get cxr and cbc.  For your current fatigue and recent weight gain complaints include thyroid studies today.  Follow up in 7-10 days or as needed

## 2016-06-27 ENCOUNTER — Encounter: Payer: Self-pay | Admitting: Medical

## 2016-06-27 ENCOUNTER — Ambulatory Visit (INDEPENDENT_AMBULATORY_CARE_PROVIDER_SITE_OTHER): Payer: MEDICARE | Admitting: Medical

## 2016-06-27 ENCOUNTER — Telehealth: Payer: Self-pay | Admitting: Medical

## 2016-06-27 ENCOUNTER — Ambulatory Visit (HOSPITAL_BASED_OUTPATIENT_CLINIC_OR_DEPARTMENT_OTHER)
Admission: RE | Admit: 2016-06-27 | Discharge: 2016-06-27 | Disposition: A | Discharge status: Home / Self Care | Payer: MEDICARE | Source: Ambulatory Visit | Attending: Medical | Admitting: Medical

## 2016-06-27 VITALS — BP 118/66 | HR 62 | Temp 97.9°F | Resp 16 | Ht 66.0 in | Wt 188.5 lb

## 2016-06-27 DIAGNOSIS — R932 Abnormal findings on diagnostic imaging of liver and biliary tract: Secondary | ICD-10-CM | POA: Diagnosis not present

## 2016-06-27 DIAGNOSIS — R109 Unspecified abdominal pain: Secondary | ICD-10-CM

## 2016-06-27 DIAGNOSIS — M25572 Pain in left ankle and joints of left foot: Secondary | ICD-10-CM | POA: Diagnosis not present

## 2016-06-27 DIAGNOSIS — M25512 Pain in left shoulder: Secondary | ICD-10-CM | POA: Diagnosis not present

## 2016-06-27 DIAGNOSIS — M25562 Pain in left knee: Secondary | ICD-10-CM | POA: Diagnosis not present

## 2016-06-27 DIAGNOSIS — K649 Unspecified hemorrhoids: Secondary | ICD-10-CM

## 2016-06-27 DIAGNOSIS — R17 Unspecified jaundice: Secondary | ICD-10-CM

## 2016-06-27 DIAGNOSIS — K760 Fatty (change of) liver, not elsewhere classified: Secondary | ICD-10-CM

## 2016-06-27 DIAGNOSIS — M25561 Pain in right knee: Secondary | ICD-10-CM

## 2016-06-27 LAB — CBC WITH DIFFERENTIAL/PLATELET
Basophils Absolute: 0 10*3/uL (ref 0.0–0.1)
Basophils Relative: 0.5 % (ref 0.0–3.0)
Eosinophils Absolute: 0.1 10*3/uL (ref 0.0–0.7)
Eosinophils Relative: 3.4 % (ref 0.0–5.0)
HCT: 42.9 % (ref 39.0–52.0)
Hemoglobin: 14.3 g/dL (ref 13.0–17.0)
Lymphocytes Relative: 44.6 % (ref 12.0–46.0)
Lymphs Abs: 1.9 10*3/uL (ref 0.7–4.0)
MCHC: 33.3 g/dL (ref 30.0–36.0)
MCV: 84.1 fl (ref 78.0–100.0)
Monocytes Absolute: 0.5 10*3/uL (ref 0.1–1.0)
Monocytes Relative: 11.6 % (ref 3.0–12.0)
Neutro Abs: 1.7 10*3/uL (ref 1.4–7.7)
Neutrophils Relative %: 39.9 % — ABNORMAL LOW (ref 43.0–77.0)
Platelets: 297 10*3/uL (ref 150.0–400.0)
RBC: 5.1 Mil/uL (ref 4.22–5.81)
RDW: 13.1 % (ref 11.5–15.5)
WBC: 4.4 10*3/uL (ref 4.0–10.5)

## 2016-06-27 LAB — AMYLASE: AMYLASE: 46 U/L (ref 27–131)

## 2016-06-27 LAB — LIPASE: Lipase: 23 U/L (ref 11.0–59.0)

## 2016-06-27 MED ORDER — HYDROCORTISONE ACETATE 25 MG RE SUPP: 25.0000 mg | Freq: Two times a day (BID) | RECTAL | 0 refills | Status: DC

## 2016-06-27 MED ORDER — TRAMADOL HCL 50 MG PO TABS: 50.0000 mg | ORAL_TABLET | Freq: Four times a day (QID) | ORAL | 0 refills | Status: DC | PRN

## 2016-06-27 NOTE — Progress Notes (Signed)
Subjective:    Patient ID: Brian [name], male    DOB: 11-15-1945, 71 y.o.   MRN: [id number]  HPI  Pt in complains of  knee pain bilaterally. Both hurts hurt about the same.  He also has some left ankle pain as well.  Feels like give out when steps on it. Pain last couple of days.  Also pain sharp and stabbing in lt shoulder/ left upper arm. Pain came on christmas day starting huring and feeling numb. Pt saw Dr. Felipe Drone and given injection before christmas. Helped for 2 days and then pain came back. No associated cardiac symptoms. Movement of left shoulder makes it hurt worse.  In addition some ruq pain. Sharp pain on and off occasionally(last for 2 minutes then subsides(pain for 2 days about twice a day). Not associated with foods.   Pt states yesterday he passed some bright red blood before and after bm. No hx of hemorrhoids. Pt had some itching in rectal area for one week. No constipation. No hx of hemorrhoids. Pt states he had colonoscopy in past. MD in [place] told him he needed repeat in 2020. Pt saw [facility] and he states they have his old report.  Review of Systems  Constitutional: Negative for chills, fatigue and fever.  Respiratory: Negative for cough, chest tightness, shortness of breath and wheezing.   Cardiovascular: Negative for chest pain and palpitations.  Gastrointestinal: Positive for abdominal pain and blood in stool. Negative for constipation, diarrhea, nausea, rectal pain and vomiting.       See hpi  Musculoskeletal: Negative for back pain.       See hpi.  Skin: Negative for rash.  Neurological: Negative for dizziness and headaches.  Hematological: Negative for adenopathy. Does not bruise/bleed easily.  Psychiatric/Behavioral: Negative for behavioral problems and confusion.   Past Medical History:  Diagnosis Date  . Atypical chest pain     Negative cardiac catheterization 2014  . 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 Blind right eye   . Borderline glaucoma  with ocular hypertension 04.10.2013  . Chicken pox   . Chronic back pain   . Dislocation, jaw 1960s  . Edema    Left Leg  . Essential hypertension, benign   . GERD (gastroesophageal reflux disease)   . [place] measles   . Heart attack   . Hyperlipidemia   . Measles   . Mumps   . Osteopenia 11.12.10   bone density test  . Personal history of other diseases of digestive system 08.10.12   Gastric ulcer     Social History   Social History  . Marital status: Single    Spouse name: N/A  . Number of children: N/A  . Years of education: N/A   Occupational History  . Not on file.   Social History Main Topics  . Smoking status: Former Smoker    Packs/day: 0.25    Years: 2.00    Types: Cigarettes    Quit date: 06/02/1966  . Smokeless tobacco: Never Used  . Alcohol use No  . Drug use: No  . Sexual activity: No   Other Topics Concern  . Not on file   Social History Narrative  . No narrative on file    Past Surgical History:  Procedure Laterality Date  . ABDOMINAL HERNIA REPAIR  1972  . APPENDECTOMY  1973  . CATARACT EXTRACTION, BILATERAL  2005  . ESOPHAGOGASTRODUODENOSCOPY  06.2011  . EYE SURGERY  1972   Bilateral Lens Replacement  . FACIAL  RECONSTRUCTION SURGERY  2008  . LEFT HEART CATH  02.17.2014  . [place], 2008   Dislocated Jaw  . PARS PLANA VITRECTOMY W/ REPAIR OF MACULAR HOLE  2005   macular hole repair, right eye  . PARS PLANA VITRECTOMY W/ REPAIR OF MACULAR HOLE  2010   mechanical, left eye  . PROSTATE BIOPSY  Jul[place] 2017   w/ Dr. [place] Crane  . WISDOM TOOTH EXTRACTION      Family History  Problem Relation Age of Onset  . Alzheimer's disease Mother     Deceased  . Diabetes Maternal Grandmother   . Heart attack Maternal Grandmother   . Prostate cancer Maternal Uncle   . Diabetes Maternal Uncle   . Healthy Son   . Cataracts Maternal Aunt     Allergies  Allergen Reactions  . Gabapentin Other (See Comments)    Feel "weird"; Nausea;  Weakness; Syncope    Current Outpatient Prescriptions on File Prior to Visit  Medication Sig Dispense Refill  . albuterol (PROVENTIL HFA;VENTOLIN HFA) 108 (90 Base) MCG/ACT inhaler Inhale 2 puffs into the lungs every 6 (six) hours as needed for wheezing or shortness of breath. 1 Inhaler 0  . aspirin (ASPIR-81) 81 MG EC tablet Take 1 tablet by mouth daily.    Marland Kitchen b complex vitamins tablet Take 1 tablet by mouth daily.    . beclomethasone (QVAR) 40 MCG/ACT inhaler Inhale 2 puffs into the lungs 2 times daily at 12 noon and 4 pm. 1 Inhaler 2  . Calcium Carbonate-Vitamin D (CALCIUM-VITAMIN D3 PO) Take by mouth daily.    Marland Kitchen ezetimibe (ZETIA) 10 MG tablet Take 1 tablet (10 mg total) by mouth daily. 30 tablet 2  . fluticasone (FLOVENT HFA) 110 MCG/ACT inhaler Inhale 2 puffs into the lungs 2 (two) times daily at 10 AM and 5 PM. 1 Inhaler 5  . hydrochlorothiazide (MICROZIDE) 12.5 MG capsule TAKE 1 CAPSULE EVERY DAY 90 capsule 1  . levocetirizine (XYZAL) 5 MG tablet Take 1 tablet (5 mg total) by mouth every evening. 90 tablet 0  . mirabegron ER (MYRBETRIQ) 50 MG TB24 tablet Take 50 mg by mouth daily.    . pantoprazole (PROTONIX) 40 MG tablet Take 40 mg by mouth 2 (two) times daily.    . ranitidine (ZANTAC) 150 MG capsule Take 1 capsule (150 mg total) by mouth 2 (two) times daily. 60 capsule 0  . simvastatin (ZOCOR) 40 MG tablet TAKE 1 TABLET EVERY EVENING 90 tablet 1  . tamsulosin (FLOMAX) 0.4 MG CAPS capsule Take 1 capsule (0.4 mg total) by mouth daily. 90 capsule 1  . timolol (TIMOPTIC) 0.5 % ophthalmic solution Place 1 drop into the left eye daily. 10 mL 12   No current facility-administered medications on file prior to visit.     BP 118/66 (BP Location: Right Arm, Patient Position: Sitting, Cuff Size: Large)   Pulse 62   Temp 97.9 F (36.6 C) (Oral)   Resp 16   Ht 5\' 6"  (1.676 m)   Wt 188 lb 8 oz (85.5 kg)   SpO2 96%   BMI 30.42 kg/m       Objective:   Physical Exam   General  Appearance- Not in acute distress.  HEENT Eyes- Scleraeral/Conjuntiva-bilat- Not Yellow. Mouth & Throat- Normal.  Chest and Lung Exam Auscultation: Breath sounds:-Normal. Adventitious sounds:- No Adventitious sounds.  Cardiovascular Auscultation:Rythm - Regular. Heart Sounds -Normal heart sounds.  Abdomen Inspection:-Inspection Normal.  Palpation/Perucssion: Palpation and Percussion of the abdomen reveal-  Non Tender, No Rebound tenderness, No rigidity(Guarding) and No Palpable abdominal masses.  Liver:-Normal.  Spleen:- Normal.   Rectal Anorectal Exam: Stool - Hemoccult of stool/mucous is Heme Negative. External - small henmorrhoid at 10 'clock position. Internal - normal sphincter tone. No rectal mass.  Lt shoulder- mild pain on rom but no pain on palpation. Lt upper humerus- faint pain on palpation and when he rotates shoulder.  Knees- no instability. No crepitus. No swelling. No warmth on palpation. Lt ankle- no warm. nml rom. Medial malleolus tender to palpation.       Assessment & Plan:  For abdomen pain. Continue zantac. Get labs and abdomen US today.  For hemorrhoids rx annusol hc suppository. Do ifob in 2 weeks. If positive for blood after treatment then refer to GI. Or if any recurrent blood seen before then.  tx tramadol for your joint pains. After labs and [place] back. Might add nsaid at that point.  If abd pain worsesn or changes after hours then ED evaluation.  Follow up in 2 weeks or as needed  Frayda Egley, Percell Miller, [employer]

## 2016-06-27 NOTE — Telephone Encounter (Signed)
°  Relation to [id number] Call back number:[phone] Pharmacy:[facility]  Reason for call: pt states that the rx annusol for hemorrhoids is $221, after his insurance, pt would like for Percell Miller to provide another rx for him if possible.

## 2016-06-27 NOTE — Telephone Encounter (Signed)
I called pt late Friday after hours. anusol hc $221 dollars. I thought his hemorrhoid looked residual inflammation with minimal symptoms. So preparation h otc may be adequate. He agreed would try.

## 2016-06-27 NOTE — Patient Instructions (Signed)
For abdomen pain. Continue zantac. Get labs and abdomen US today.  For hemorrhoids rx annusol hc suppository. Do ifob in 2 weeks. If positive for blood after treatment then refer to GI. Or if any recurrent blood seen before then.  tx tramadol for your joint pains. After labs and [place] back. Might add nsaid at that point.  If abd pain worsesn or changes after hours then ED evaluation.  Follow up in 2 weeks or as needed

## 2016-06-27 NOTE — Progress Notes (Signed)
Pre visit review using our clinic review tool, if applicable. No additional management support is needed unless otherwise documented below in the visit note/SLS [place], can get pt in for US Abdominal at 1:30pm; scheduled/SLS 01/26

## 2016-06-28 LAB — COMPLETE METABOLIC PANEL WITH GFR
ALT: 16 U/L (ref 9–46)
AST: 18 U/L (ref 10–35)
Albumin: 3.9 g/dL (ref 3.6–5.1)
Alkaline Phosphatase: 63 U/L (ref 40–115)
BUN: 11 mg/dL (ref 7–25)
CALCIUM: 9.5 mg/dL (ref 8.6–10.3)
CHLORIDE: 103 mmol/L (ref 98–110)
CO2: 28 mmol/L (ref 20–31)
CREATININE: 1.13 mg/dL (ref 0.70–1.18)
GFR, Est African American: 75 mL/min (ref >=60)
GFR, Est Non African American: 65 mL/min (ref >=60)
GLUCOSE: 97 mg/dL (ref 65–99)
POTASSIUM: 4.1 mmol/L (ref 3.5–5.3)
SODIUM: 138 mmol/L (ref 135–146)
Total Bilirubin: 1.6 mg/dL — ABNORMAL HIGH (ref 0.2–1.2)
Total Protein: 6.8 g/dL (ref 6.1–8.1)

## 2016-06-28 NOTE — Telephone Encounter (Signed)
Gi referal placed.

## 2016-07-11 ENCOUNTER — Other Ambulatory Visit (INDEPENDENT_AMBULATORY_CARE_PROVIDER_SITE_OTHER): Payer: MEDICARE

## 2016-07-11 DIAGNOSIS — K921 Melena: Secondary | ICD-10-CM

## 2016-07-11 LAB — FECAL OCCULT BLOOD, IMMUNOCHEMICAL: Fecal Occult Bld: NEGATIVE

## 2016-07-18 ENCOUNTER — Telehealth: Payer: Self-pay | Admitting: Medical

## 2016-07-18 NOTE — Telephone Encounter (Signed)
FYI: Patient called to inform Percell Miller that he has the results and is scheduled for an OV on Wednesday 2/21.

## 2016-07-23 ENCOUNTER — Encounter: Payer: Self-pay | Admitting: Medical

## 2016-07-23 ENCOUNTER — Ambulatory Visit (INDEPENDENT_AMBULATORY_CARE_PROVIDER_SITE_OTHER): Payer: MEDICARE | Admitting: Medical

## 2016-07-23 VITALS — BP 130/62 | HR 59 | Temp 97.6°F | Resp 16 | Ht 66.0 in | Wt 191.1 lb

## 2016-07-23 DIAGNOSIS — R1013 Epigastric pain: Secondary | ICD-10-CM

## 2016-07-23 DIAGNOSIS — N281 Cyst of kidney, acquired: Secondary | ICD-10-CM | POA: Diagnosis not present

## 2016-07-23 DIAGNOSIS — K8020 Calculus of gallbladder without cholecystitis without obstruction: Secondary | ICD-10-CM

## 2016-07-23 DIAGNOSIS — R1011 Right upper quadrant pain: Secondary | ICD-10-CM

## 2016-07-23 NOTE — Patient Instructions (Addendum)
For your abdomen pain and gallstones do recommend you see surgeon this friday to discuss risk vs benefits regarding surgery to remove gallbladder.  Regarding you cyst on kidney will repeat imaging studies in 6 months- 1 yr.  For abdomen pain continue your protonix. I do want to get h pylori test today.  Follow up date to be determined after surgery appointment. They may want surgical clearance form filled out.

## 2016-07-23 NOTE — Progress Notes (Signed)
Subjective:    Patient ID: Brian [name], male    DOB: 07-24-1945, 71 y.o.   MRN: [id number]  HPI  Pt states he went to [facility]. This was part of work up for RUQ after finding some fatty liver and total bilirubin of 1.6. At GI office he reported one episode of seeing blood on toilet paper.  CT Studies done to rule out common bile duct stone or mass with possible small stone in gallbladder.  Then pt states mri of his abdomen was ordered which did confirm gall stones.  Pt still has intermittent epigastric pain and ruq pain. Pt still has pain intermittent with nausea. Pain level can be moderate- severe pain. Pain can be level 7/10. But transient.  Pt was scheduled to have surgery consult on this Friday.      Review of Systems  Constitutional: Negative for chills, fatigue and fever.  Respiratory: Negative for cough, chest tightness, shortness of breath and wheezing.   Cardiovascular: Negative for palpitations.  Gastrointestinal: Positive for abdominal pain. Negative for abdominal distention, constipation, diarrhea, nausea and rectal pain.       No pain now but some this morning.  Genitourinary: Negative for difficulty urinating, flank pain, frequency, testicular pain and urgency.  Musculoskeletal: Negative for back pain, myalgias and neck pain.  Skin: Negative for rash.  Neurological: Negative for dizziness, weakness, light-headedness and numbness.  Hematological: Negative for adenopathy.  Psychiatric/Behavioral: Negative for behavioral problems and confusion.    Past Medical History:  Diagnosis Date  . Atypical chest pain     Negative cardiac catheterization 2014  . 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 Blind right eye   . 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 Chicken pox   . Chronic back pain   . Dislocation, jaw 1960s  . Edema    Left Leg  . Essential hypertension, benign   . GERD (gastroesophageal reflux disease)   . [place] measles   . Heart attack    . Hyperlipidemia   . Measles   . Mumps   . Osteopenia 11.12.10   bone density test  . Personal history of other diseases of digestive system 08.10.12   Gastric ulcer     Social History   Social History  . Marital status: Single    Spouse name: N/A  . Number of children: N/A  . Years of education: N/A   Occupational History  . Not on file.   Social History Main Topics  . Smoking status: Former Smoker    Packs/day: 0.25    Years: 2.00    Types: Cigarettes    Quit date: 06/02/1966  . Smokeless tobacco: Never Used  . Alcohol use No  . Drug use: No  . Sexual activity: No   Other Topics Concern  . Not on file   Social History Narrative  . No narrative on file    Past Surgical History:  Procedure Laterality Date  . ABDOMINAL HERNIA REPAIR  1972  . APPENDECTOMY  1973  . CATARACT EXTRACTION, BILATERAL  2005  . ESOPHAGOGASTRODUODENOSCOPY  06.2011  . EYE SURGERY  1972   Bilateral Lens Replacement  . FACIAL RECONSTRUCTION SURGERY  2008  . LEFT HEART CATH  02.17.2014  . [place], 2008   Dislocated Jaw  . PARS PLANA VITRECTOMY W/ REPAIR OF MACULAR HOLE  2005   macular hole repair, right eye  . PARS PLANA VITRECTOMY W/ REPAIR OF MACULAR HOLE  2010   mechanical, left eye  .  PROSTATE BIOPSY  [place] 2017   w/ Dr. Nevada Crane  . WISDOM TOOTH EXTRACTION      Family History  Problem Relation Age of Onset  . Alzheimer's disease Mother     Deceased  . Diabetes Maternal Grandmother   . Heart attack Maternal Grandmother   . Prostate cancer Maternal Uncle   . Diabetes Maternal Uncle   . Healthy Son   . Cataracts Maternal Aunt     Allergies  Allergen Reactions  . Gabapentin Other (See Comments)    Feel "weird"; Nausea; Weakness; Syncope    Current Outpatient Prescriptions on File Prior to Visit  Medication Sig Dispense Refill  . albuterol (PROVENTIL HFA;VENTOLIN HFA) 108 (90 Base) MCG/ACT inhaler Inhale 2 puffs into the lungs every 6 (six) hours as needed  for wheezing or shortness of breath. 1 Inhaler 0  . aspirin (ASPIR-81) 81 MG EC tablet Take 1 tablet by mouth daily.    Marland Kitchen b complex vitamins tablet Take 1 tablet by mouth daily.    . beclomethasone (QVAR) 40 MCG/ACT inhaler Inhale 2 puffs into the lungs 2 times daily at 12 noon and 4 pm. 1 Inhaler 2  . Calcium Carbonate-Vitamin D (CALCIUM-VITAMIN D3 PO) Take by mouth daily.    Marland Kitchen ezetimibe (ZETIA) 10 MG tablet Take 1 tablet (10 mg total) by mouth daily. 30 tablet 2  . fluticasone (FLOVENT HFA) 110 MCG/ACT inhaler Inhale 2 puffs into the lungs 2 (two) times daily at 10 AM and 5 PM. 1 Inhaler 5  . hydrochlorothiazide (MICROZIDE) 12.5 MG capsule TAKE 1 CAPSULE EVERY DAY 90 capsule 1  . levocetirizine (XYZAL) 5 MG tablet Take 1 tablet (5 mg total) by mouth every evening. 90 tablet 0  . mirabegron ER (MYRBETRIQ) 50 MG TB24 tablet Take 50 mg by mouth daily.    . pantoprazole (PROTONIX) 40 MG tablet Take 40 mg by mouth 2 (two) times daily.    . ranitidine (ZANTAC) 150 MG capsule Take 1 capsule (150 mg total) by mouth 2 (two) times daily. 60 capsule 0  . simvastatin (ZOCOR) 40 MG tablet TAKE 1 TABLET EVERY EVENING 90 tablet 1  . tamsulosin (FLOMAX) 0.4 MG CAPS capsule Take 1 capsule (0.4 mg total) by mouth daily. 90 capsule 1  . timolol (TIMOPTIC) 0.5 % ophthalmic solution Place 1 drop into the left eye daily. 10 mL 12  . traMADol (ULTRAM) 50 MG tablet Take 1 tablet (50 mg total) by mouth every 6 (six) hours as needed for severe pain. 20 tablet 0   No current facility-administered medications on file prior to visit.     BP 130/62 (BP Location: Left Arm, Patient Position: Sitting, Cuff Size: Large)   Pulse (!) 59   Temp 97.6 F (36.4 C) (Oral)   Resp 16   Ht 5\' 6"  (1.676 m)   Wt 191 lb 2 oz (86.7 kg)   SpO2 97%   BMI 30.85 kg/m       Objective:   Physical Exam   General Appearance- Not in acute distress.  HEENT Eyes- Scleraeral/Conjuntiva-bilat- Not Yellow. Mouth & Throat-  Normal.  Chest and Lung Exam Auscultation: Breath sounds:-Normal. Adventitious sounds:- No Adventitious sounds.  Cardiovascular Auscultation:Rythm - Regular. Heart Sounds -Normal heart sounds.  Abdomen Inspection:-Inspection Normal.  Palpation/Perucssion: Palpation and Percussion of the abdomen reveal- Non Tender, No Rebound tenderness, No rigidity(Guarding) and No Palpable abdominal masses.  Liver:-Normal.  Spleen:- Normal.   Back- no cva pain on palpation.     Assessment & Plan:  For your abdomen pain and gallstones do recommend you see surgeon this friday to discuss risk vs benefits regarding surgery to remove gallbladder.  Regarding you cyst on kidney will repeat imaging studies in 6 months- 1 yr.  For abdomen pain continue your protonix. I do want to get h pylori test today.  Follow up date to be determined after surgery appointment. They may want surgical clearance form filled out.  Domnique Vantine, Percell Miller, PA-C

## 2016-07-23 NOTE — Progress Notes (Signed)
Pre visit review using our clinic review tool, if applicable. No additional management support is needed unless otherwise documented below in the visit note/SLS  

## 2016-07-24 LAB — H. PYLORI BREATH TEST: H. pylori Breath Test: NOT DETECTED

## 2016-08-13 ENCOUNTER — Ambulatory Visit: Payer: MEDICARE | Admitting: Medical

## 2016-08-15 ENCOUNTER — Encounter: Payer: Self-pay | Admitting: Medical

## 2016-08-15 ENCOUNTER — Ambulatory Visit (INDEPENDENT_AMBULATORY_CARE_PROVIDER_SITE_OTHER): Payer: MEDICARE | Admitting: Medical

## 2016-08-15 VITALS — BP 106/71 | HR 65 | Temp 97.8°F | Resp 16 | Ht 66.0 in | Wt 180.3 lb

## 2016-08-15 DIAGNOSIS — K760 Fatty (change of) liver, not elsewhere classified: Secondary | ICD-10-CM

## 2016-08-15 DIAGNOSIS — R5383 Other fatigue: Secondary | ICD-10-CM

## 2016-08-15 DIAGNOSIS — Z9889 Other specified postprocedural states: Secondary | ICD-10-CM

## 2016-08-15 DIAGNOSIS — R0789 Other chest pain: Secondary | ICD-10-CM

## 2016-08-15 LAB — COMPREHENSIVE METABOLIC PANEL
ALBUMIN: 3.9 g/dL (ref 3.5–5.2)
ALK PHOS: 68 U/L (ref 39–117)
ALT: 20 U/L (ref 0–53)
AST: 19 U/L (ref 0–37)
BUN: 15 mg/dL (ref 6–23)
CALCIUM: 9.7 mg/dL (ref 8.4–10.5)
CHLORIDE: 99 meq/L (ref 96–112)
CO2: 30 mEq/L (ref 19–32)
Creatinine, Ser: 1.17 mg/dL (ref 0.40–1.50)
GFR: 78.65 mL/min (ref >60.00)
Glucose, Bld: 101 mg/dL — ABNORMAL HIGH (ref 70–99)
POTASSIUM: 3.7 meq/L (ref 3.5–5.1)
Sodium: 135 mEq/L (ref 135–145)
Total Bilirubin: 0.9 mg/dL (ref 0.2–1.2)
Total Protein: 7.3 g/dL (ref 6.0–8.3)

## 2016-08-15 LAB — CBC WITH DIFFERENTIAL/PLATELET
BASOS PCT: 1 % (ref 0.0–3.0)
Basophils Absolute: 0 10*3/uL (ref 0.0–0.1)
EOS PCT: 4.2 % (ref 0.0–5.0)
Eosinophils Absolute: 0.2 10*3/uL (ref 0.0–0.7)
HEMATOCRIT: 41.3 % (ref 39.0–52.0)
HEMOGLOBIN: 13.6 g/dL (ref 13.0–17.0)
Lymphocytes Relative: 31.7 % (ref 12.0–46.0)
Lymphs Abs: 1.4 10*3/uL (ref 0.7–4.0)
MCHC: 32.9 g/dL (ref 30.0–36.0)
MCV: 86.1 fl (ref 78.0–100.0)
MONOS PCT: 14.2 % — AB (ref 3.0–12.0)
Monocytes Absolute: 0.6 10*3/uL (ref 0.1–1.0)
Neutro Abs: 2.2 10*3/uL (ref 1.4–7.7)
Neutrophils Relative %: 48.9 % (ref 43.0–77.0)
Platelets: 319 10*3/uL (ref 150.0–400.0)
RBC: 4.8 Mil/uL (ref 4.22–5.81)
RDW: 13.2 % (ref 11.5–15.5)
WBC: 4.5 10*3/uL (ref 4.0–10.5)

## 2016-08-15 LAB — TROPONIN I: TNIDX: 0 ug/L (ref 0.00–0.06)

## 2016-08-15 NOTE — Patient Instructions (Signed)
You appear to be doing well post surgery. Surgical site does not look infected.  For fatty liver reduce fat in diet, no alcohol and reduce fructose in diet.  Post surgery doe breathing exercises to reduce chance pneumonia. Any cough developes notify us.  For fatigue will get cbc and cmp today.  For very atypical chest pain will get troponin. If you get any chest pain that is constant,lasting with associated cardiac like symptoms then ED evaluation  Follow up in 1 month but maybe sooner if energy not improving or if lab abnormality.

## 2016-08-15 NOTE — Progress Notes (Signed)
Pre visit review using our clinic review tool, if applicable. No additional management support is needed unless otherwise documented below in the visit note/SLS  

## 2016-08-15 NOTE — Progress Notes (Signed)
Subjective:    Patient ID: Brian [name], male    DOB: 27-Sep-1945, 71 y.o.   MRN: [id number]  HPI  Pt in for follow up. Pt had gallbladder removed past Friday. He states no complications. Pt states his stomach feels better  and his sour taste in mouth went away.  Pt breathing is stable. Occasional rare wheeze. He had misunderstanding on how to use inhalers/when to use. We discussed that today. Myself and MA did as well. He knows how to use inhalers now.  Pt has fatty liver on prior imaging but ast and alt were normal.  Post surgery no fever, no chills or sweats. No cough.  Pt states since surgery mild fatigue.    Review of Systems  Constitutional: Positive for fatigue. Negative for chills and fever.  Respiratory: Negative for cough, chest tightness, shortness of breath and wheezing.   Cardiovascular: Negative for chest pain and palpitations.       No current chest pain. But mentioned last night brief 1-3 second sharp transient chest pain. No associsated cardiac sypmptoms. Non present today.  Gastrointestinal: Negative for abdominal pain, anal bleeding, blood in stool, constipation, diarrhea, rectal pain and vomiting.  Genitourinary: Negative for dysuria, frequency and hematuria.  Skin: Negative for pallor and rash.  Neurological: Negative for dizziness and headaches.  Hematological: Negative for adenopathy. Does not bruise/bleed easily.  Psychiatric/Behavioral: Negative for behavioral problems, confusion, decreased concentration and dysphoric mood. The patient is not hyperactive.     Past Medical History:  Diagnosis Date  . Atypical chest pain     Negative cardiac catheterization 2014  . 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 Blind right eye   . 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 Chicken pox   . Chronic back pain   . Dislocation, jaw 1960s  . Edema    Left Leg  . Essential hypertension, benign   . GERD (gastroesophageal reflux disease)   . [place]  measles   . Heart attack   . Hyperlipidemia   . Measles   . Mumps   . Osteopenia 11.12.10   bone density test  . Personal history of other diseases of digestive system 08.10.12   Gastric ulcer     Social History   Social History  . Marital status: Single    Spouse name: N/A  . Number of children: N/A  . Years of education: N/A   Occupational History  . Not on file.   Social History Main Topics  . Smoking status: Former Smoker    Packs/day: 0.25    Years: 2.00    Types: Cigarettes    Quit date: 06/02/1966  . Smokeless tobacco: Never Used  . Alcohol use No  . Drug use: No  . Sexual activity: No   Other Topics Concern  . Not on file   Social History Narrative  . No narrative on file    Past Surgical History:  Procedure Laterality Date  . ABDOMINAL HERNIA REPAIR  1972  . APPENDECTOMY  1973  . CATARACT EXTRACTION, BILATERAL  2005  . ESOPHAGOGASTRODUODENOSCOPY  06.2011  . EYE SURGERY  1972   Bilateral Lens Replacement  . FACIAL RECONSTRUCTION SURGERY  2008  . LEFT HEART CATH  02.17.2014  . [place], 2008   Dislocated Jaw  . PARS PLANA VITRECTOMY W/ REPAIR OF MACULAR HOLE  2005   macular hole repair, right eye  . PARS PLANA VITRECTOMY W/ REPAIR OF MACULAR HOLE  2010   mechanical,  left eye  . PROSTATE BIOPSY  [place] 2017   w/ Dr. [place] Crane  . WISDOM TOOTH EXTRACTION      Family History  Problem Relation Age of Onset  . Alzheimer's disease Mother     Deceased  . Diabetes Maternal Grandmother   . Heart attack Maternal Grandmother   . Prostate cancer Maternal Uncle   . Diabetes Maternal Uncle   . Healthy Son   . Cataracts Maternal Aunt     Allergies  Allergen Reactions  . Gabapentin Other (See Comments)    Feel "weird"; Nausea; Weakness; Syncope    Current Outpatient Prescriptions on File Prior to Visit  Medication Sig Dispense Refill  . albuterol (PROVENTIL HFA;VENTOLIN HFA) 108 (90 Base) MCG/ACT inhaler Inhale 2 puffs into the lungs every  6 (six) hours as needed for wheezing or shortness of breath. 1 Inhaler 0  . aspirin (ASPIR-81) 81 MG EC tablet Take 1 tablet by mouth daily.    Marland Kitchen b complex vitamins tablet Take 1 tablet by mouth daily.    . Calcium Carbonate-Vitamin D (CALCIUM-VITAMIN D3 PO) Take by mouth daily.    Marland Kitchen ezetimibe (ZETIA) 10 MG tablet Take 1 tablet (10 mg total) by mouth daily. 30 tablet 2  . fluticasone (FLOVENT HFA) 110 MCG/ACT inhaler Inhale 2 puffs into the lungs 2 (two) times daily at 10 AM and 5 PM. 1 Inhaler 5  . hydrochlorothiazide (MICROZIDE) 12.5 MG capsule TAKE 1 CAPSULE EVERY DAY 90 capsule 1  . levocetirizine (XYZAL) 5 MG tablet Take 1 tablet (5 mg total) by mouth every evening. 90 tablet 0  . mirabegron ER (MYRBETRIQ) 50 MG TB24 tablet Take 50 mg by mouth daily.    . pantoprazole (PROTONIX) 40 MG tablet Take 40 mg by mouth 2 (two) times daily.    . ranitidine (ZANTAC) 150 MG capsule Take 1 capsule (150 mg total) by mouth 2 (two) times daily. 60 capsule 0  . simvastatin (ZOCOR) 40 MG tablet TAKE 1 TABLET EVERY EVENING 90 tablet 1  . tamsulosin (FLOMAX) 0.4 MG CAPS capsule Take 1 capsule (0.4 mg total) by mouth daily. 90 capsule 1  . timolol (TIMOPTIC) 0.5 % ophthalmic solution Place 1 drop into the left eye daily. 10 mL 12  . traMADol (ULTRAM) 50 MG tablet Take 1 tablet (50 mg total) by mouth every 6 (six) hours as needed for severe pain. 20 tablet 0   No current facility-administered medications on file prior to visit.     BP 106/71 (BP Location: Right Arm, Patient Position: Sitting, Cuff Size: Large)   Pulse 65   Temp 97.8 F (36.6 C) (Oral)   Resp 16   Ht 5\' 6"  (1.676 m)   Wt 180 lb 5 oz (81.8 kg)   SpO2 99%   BMI 29.10 kg/m       Objective:   Physical Exam   General Mental Status- Alert. General Appearance- Not in acute distress.   Skin General: Color- Normal Color. Moisture- Normal Moisture.  Neck Carotid Arteries- Normal color. Moisture- Normal Moisture. No carotid bruits. No  JVD.  Chest and Lung Exam Auscultation: Breath Sounds:-Normal.  Cardiovascular Auscultation:Rythm- Regular. Murmurs & Other Heart Sounds:Auscultation of the heart reveals- No Murmurs.  Abdomen Inspection:-Inspeection Normal. Palpation/Percussion:Note:No mass. Palpation and Percussion of the abdomen reveal- Non Tender, Non Distended + BS, no rebound or guarding.(trochar surgical sites appear clean and not infected.).   Neurologic Cranial Nerve exam:- CN III-XII intact(No nystagmus), symmetric smile. Strength:- 5/5 equal and symmetric strength both  upper and lower extremities.  Lower ext- no pedal edema.    Assessment & Plan:  You appear to be doing well post surgery. Surgical site does not look infected.  For fatty liver reduce fat in diet, no alcohol and reduce fructose in diet.  Post surgery doe breathing exercises to reduce chance pneumonia. Any cough developes notify us.  For fatigue will get cbc and cmp today.  For very atypical chest pain will get troponin. If you get any chest pain that is constant,lasting with associated cardiac like symptoms then ED evaluation  Follow up in 1 month but maybe sooner if energy not improving or if lab abnormality.  Marshell Rieger, Percell Miller, PA-C

## 2016-08-26 ENCOUNTER — Encounter: Payer: Self-pay | Admitting: Medical

## 2016-08-26 ENCOUNTER — Ambulatory Visit (INDEPENDENT_AMBULATORY_CARE_PROVIDER_SITE_OTHER): Payer: MEDICARE | Admitting: Medical

## 2016-08-26 VITALS — BP 119/67 | HR 57 | Temp 98.2°F | Resp 16 | Ht 66.0 in | Wt 183.2 lb

## 2016-08-26 DIAGNOSIS — R55 Syncope and collapse: Secondary | ICD-10-CM

## 2016-08-26 DIAGNOSIS — R0781 Pleurodynia: Secondary | ICD-10-CM

## 2016-08-26 DIAGNOSIS — R51 Headache: Secondary | ICD-10-CM | POA: Diagnosis not present

## 2016-08-26 DIAGNOSIS — R519 Headache, unspecified: Secondary | ICD-10-CM

## 2016-08-26 DIAGNOSIS — W19XXXA Unspecified fall, initial encounter: Secondary | ICD-10-CM

## 2016-08-26 LAB — CBC WITH DIFFERENTIAL/PLATELET
BASOS PCT: 0.8 % (ref 0.0–3.0)
Basophils Absolute: 0 10*3/uL (ref 0.0–0.1)
EOS ABS: 0.1 10*3/uL (ref 0.0–0.7)
Eosinophils Relative: 3.2 % (ref 0.0–5.0)
HCT: 42 % (ref 39.0–52.0)
HEMOGLOBIN: 13.7 g/dL (ref 13.0–17.0)
Lymphocytes Relative: 45.2 % (ref 12.0–46.0)
Lymphs Abs: 1.9 10*3/uL (ref 0.7–4.0)
MCHC: 32.7 g/dL (ref 30.0–36.0)
MCV: 85.8 fl (ref 78.0–100.0)
MONO ABS: 0.5 10*3/uL (ref 0.1–1.0)
Monocytes Relative: 13 % — ABNORMAL HIGH (ref 3.0–12.0)
NEUTROS ABS: 1.6 10*3/uL (ref 1.4–7.7)
Neutrophils Relative %: 37.8 % — ABNORMAL LOW (ref 43.0–77.0)
PLATELETS: 363 10*3/uL (ref 150.0–400.0)
RBC: 4.89 Mil/uL (ref 4.22–5.81)
RDW: 13 % (ref 11.5–15.5)
WBC: 4.1 10*3/uL (ref 4.0–10.5)

## 2016-08-26 LAB — COMPREHENSIVE METABOLIC PANEL
ALT: 18 U/L (ref 0–53)
AST: 18 U/L (ref 0–37)
Albumin: 4 g/dL (ref 3.5–5.2)
Alkaline Phosphatase: 68 U/L (ref 39–117)
BUN: 12 mg/dL (ref 6–23)
CHLORIDE: 101 meq/L (ref 96–112)
CO2: 33 meq/L — AB (ref 19–32)
CREATININE: 1.11 mg/dL (ref 0.40–1.50)
Calcium: 9.3 mg/dL (ref 8.4–10.5)
GFR: 83.57 mL/min (ref >60.00)
Glucose, Bld: 104 mg/dL — ABNORMAL HIGH (ref 70–99)
Potassium: 3.5 mEq/L (ref 3.5–5.1)
SODIUM: 139 meq/L (ref 135–145)
Total Bilirubin: 0.9 mg/dL (ref 0.2–1.2)
Total Protein: 7.1 g/dL (ref 6.0–8.3)

## 2016-08-26 NOTE — Patient Instructions (Addendum)
For your syncope history we got ekg today. I want you to get labs today and go down stairs to schedule ct of head. Be caution if you get light headed. Have a seat quickly. If you pass out again then be seen by ED as syncope would be same day visit for evaluation.    Your transient brief ha is very atypical. But if constant severe ha or associated neurologic symptoms then ED evaluation as well.  We will follow labs and ct head. If both negative will likely refer you to neurologist due to remote head injury and you may need eeg.  For rib pain get xray ribs and chest. Can use tylenol for pain.   I think best to get other to climb ladders as at your age fall could result in serious injury.  Follow up in 7 days or as needed

## 2016-08-26 NOTE — Progress Notes (Signed)
Pre visit review using our clinic review tool, if applicable. No additional management support is needed unless otherwise documented below in the visit note/SLS  

## 2016-08-26 NOTE — Progress Notes (Signed)
Subjective:    Patient ID: Brian [name], male    DOB: Oct 18, 1945, 71 y.o.   MRN: [id number]  HPI  Pt in for evaluation post fall. He was on a stool trying to adjust vent in ceiling. He landed on his bed. Accident happened on Friday. No loc. No head trauma as he landed on his bed. Only mild rt rib pain.   Pt states on Sunday night he felt nausea and went to toilet. Seated and then he slouched over. He suspects that he may have had syncope. But did not fall off toilet. No ha afterwards. But he states he was sweating at  that time. He is not sure if he urinated after potential syncope. Hx of remote trauma to head. He fell out of mango when he was 71 yo. He had amnesia for 3 days. No hx of seizures. Occasional transient head ache.   Noeregarding syncope. He denies that he was straining. He was feeling light headed before he sat down.  Estimates 10-15 minutes before he came to.  No abdomen pain. No blood in stools. No nausea or vomiting.    Review of Systems  Constitutional: Negative for chills, fatigue and fever.  Respiratory: Negative for cough, chest tightness, shortness of breath and wheezing.   Cardiovascular: Negative for chest pain and palpitations.  Gastrointestinal: Negative for abdominal pain, blood in stool, constipation, diarrhea, nausea and vomiting.  Musculoskeletal:       Rt rib pain.  Neurological: Positive for dizziness, syncope and headaches. Negative for tremors, seizures, speech difficulty and weakness.       Atypical and very transient.  See hpi. No dizziness now.  Possible syncope.  Hematological: Negative for adenopathy. Does not bruise/bleed easily.  Psychiatric/Behavioral: Negative for behavioral problems, confusion, hallucinations, self-injury and suicidal ideas. The patient is not nervous/anxious.        Objective:   Physical Exam  General Mental Status- Alert. General Appearance- Not in acute distress.   Skin General: Color- Normal Color. Moisture-  Normal Moisture.  Neck Carotid Arteries- Normal color. Moisture- Normal Moisture. No carotid bruits. No JVD.  Chest and Lung Exam Auscultation: Breath Sounds:-Normal.  Cardiovascular Auscultation:Rythm- Regular. Murmurs & Other Heart Sounds:Auscultation of the heart reveals- No Murmurs.  Abdomen Inspection:-Inspeection Normal. Palpation/Percussion:Note:No mass. Palpation and Percussion of the abdomen reveal- Non Tender, Non Distended + BS, no rebound or guarding.    Neurologic Cranial Nerve exam:- CN III-XII intact(No nystagmus), symmetric smile. Drift Test:- No drift. Romberg Exam:- Negative.  Finger to Nose:- Normal/Intact Strength:- 5/5 equal and symmetric strength both upper and lower extremities.  Thorax- mild pain rt lower ribs anterior and posterior. Worse posterior.      Assessment & Plan:  ekg nsr but bradycardia. He rested a lot waiting for me before study done. As precaution I recommend pt to get bp cuff and check both pulse and bp. If pulse consistenlty low bradycardia could be contributing factor.   For your syncope history we got ekg today. I want you to get labs today and go down stairs to schedule ct of head. Be caution if you get light headed. Have a seat quickly. If you pass out again then be seen by ED as syncope would be same day visit for evaluation.    Your transient brief ha is very atypical. But if constant severe ha or associated neurologic symptoms then ED evaluation as well.  We will follow labs and ct head. If both negative will likely refer you to neurologist  due to remote head injury and you may need eeg.  For rib pain get xray ribs and chest. Can use tylenol for pain.   I think best to get other to climb ladders as at your age fall could result in serious injury.  Follow up in 7 days or as needed  Kyi Romanello, Percell Miller, [employer]

## 2016-08-27 ENCOUNTER — Ambulatory Visit (HOSPITAL_BASED_OUTPATIENT_CLINIC_OR_DEPARTMENT_OTHER)
Admission: RE | Admit: 2016-08-27 | Discharge: 2016-08-27 | Disposition: A | Discharge status: Home / Self Care | Payer: MEDICARE | Source: Ambulatory Visit | Attending: Medical | Admitting: Medical

## 2016-08-27 ENCOUNTER — Telehealth: Payer: Self-pay | Admitting: Medical

## 2016-08-27 DIAGNOSIS — Z970 Presence of artificial eye: Secondary | ICD-10-CM | POA: Diagnosis not present

## 2016-08-27 DIAGNOSIS — R55 Syncope and collapse: Secondary | ICD-10-CM | POA: Diagnosis present

## 2016-08-27 DIAGNOSIS — R0781 Pleurodynia: Secondary | ICD-10-CM | POA: Diagnosis present

## 2016-08-27 DIAGNOSIS — R51 Headache: Secondary | ICD-10-CM | POA: Diagnosis present

## 2016-08-27 NOTE — Telephone Encounter (Signed)
On the ct incidental finding of sinus thickineng above and near bridge of the nose. It is not a lot thickening. If he has pain in this area could give antibiotic. Depends if symptomatic in region. Or if has prodcutive nasal drainage when blows nose.   Just wanted him to be aware. If he does have pain in that area would rx antibiotic. Thanks, for your help.

## 2016-08-27 NOTE — Telephone Encounter (Signed)
Notified pt and he denies any of the below symptoms.

## 2016-08-31 HISTORY — PX: CHOLECYSTECTOMY: SHX55

## 2016-09-29 ENCOUNTER — Ambulatory Visit (INDEPENDENT_AMBULATORY_CARE_PROVIDER_SITE_OTHER): Payer: MEDICARE | Admitting: Medical

## 2016-09-29 ENCOUNTER — Encounter: Payer: Self-pay | Admitting: Medical

## 2016-09-29 ENCOUNTER — Ambulatory Visit (HOSPITAL_BASED_OUTPATIENT_CLINIC_OR_DEPARTMENT_OTHER)
Admission: RE | Admit: 2016-09-29 | Discharge: 2016-09-29 | Disposition: A | Discharge status: Home / Self Care | Payer: MEDICARE | Source: Ambulatory Visit | Attending: Medical | Admitting: Medical

## 2016-09-29 VITALS — BP 128/79 | HR 48 | Temp 97.6°F | Resp 14 | Ht 66.0 in | Wt 184.8 lb

## 2016-09-29 DIAGNOSIS — M19011 Primary osteoarthritis, right shoulder: Secondary | ICD-10-CM | POA: Diagnosis not present

## 2016-09-29 DIAGNOSIS — R1031 Right lower quadrant pain: Secondary | ICD-10-CM

## 2016-09-29 DIAGNOSIS — M4184 Other forms of scoliosis, thoracic region: Secondary | ICD-10-CM | POA: Insufficient documentation

## 2016-09-29 DIAGNOSIS — M546 Pain in thoracic spine: Secondary | ICD-10-CM

## 2016-09-29 DIAGNOSIS — M25511 Pain in right shoulder: Secondary | ICD-10-CM | POA: Diagnosis not present

## 2016-09-29 DIAGNOSIS — E663 Overweight: Secondary | ICD-10-CM | POA: Diagnosis not present

## 2016-09-29 DIAGNOSIS — M542 Cervicalgia: Secondary | ICD-10-CM

## 2016-09-29 DIAGNOSIS — M47814 Spondylosis without myelopathy or radiculopathy, thoracic region: Secondary | ICD-10-CM | POA: Insufficient documentation

## 2016-09-29 NOTE — Patient Instructions (Addendum)
For our neck pain will watch closely to see if you have more radiating features if so will refer back to your prior specialist.  For shoulder pain will get xray and see if need to refer to ortho for evaluation of rotator cuff.  For groin pain rt side if you notice any bulge at all let us know as hernias can re-occur but none felt today. Also let us know if any constant pain in that area as appendix also in that region.  For weight loss will refer to weight loss clinic but specify that you are interested more in nutrition rather than tablets.  For pain can use low dose tylenol or low dose ibuprofen. Or combination of you try one and pain persists.  Follow up in 3-4 weeks or as needed

## 2016-09-29 NOTE — Progress Notes (Signed)
Pre visit review using our clinic review tool, if applicable. No additional management support is needed unless otherwise documented below in the visit note. 

## 2016-09-29 NOTE — Progress Notes (Signed)
Subjective:    Patient ID: Brian [name], male    DOB: 1946/04/29, 71 y.o.   MRN: [id number]  HPI   Pt in for pain in his back. Rt side parathoracic area. Some pain in his rt  Rib area at times. No rash or burning type pain.  Pain mostly when he bends over and twists. At rest little to no pain.   Pt want to loose weight. He has expressed this on other appointments. Pt does not want to be on medication to loose weight. He wants to concentrate on diet primarily.  Mild upset stomach at times. Only with milk recently. No diarrhea. This is since surgery. Not with every meal. In past states able to drink milk. No report of lactose intolerance in pat.  Also some recent rt side neck pain, some shoulder and upper arm pain.       Review of Systems  Constitutional: Negative for chills, diaphoresis, fatigue and fever.  HENT: Negative for congestion, sinus pain and sinus pressure.   Respiratory: Negative for cough, shortness of breath and wheezing.   Cardiovascular: Negative for palpitations.  Gastrointestinal: Positive for abdominal pain. Negative for abdominal distention, blood in stool, nausea, rectal pain and vomiting.       See hpi  Genitourinary: Negative for decreased urine volume, dysuria, genital sores, penile swelling, scrotal swelling, testicular pain and urgency.       Rare groin pain.  Musculoskeletal:       See hpi.  Neurological: Negative for facial asymmetry and headaches.  Hematological: Negative for adenopathy.  Psychiatric/Behavioral: Negative for behavioral problems, confusion and suicidal ideas. The patient is not nervous/anxious.        Objective:   Physical Exam  General Mental Status- Alert. General Appearance- Not in acute distress.   Skin General: Color- Normal Color. Moisture- Normal Moisture.  Neck Carotid Arteries- Normal color. Moisture- Normal Moisture. No carotid bruits. No JVD. Mid rt side paracervical spine tenderness to palpaton.  Chest and  Lung Exam Auscultation: Breath Sounds:-Normal.  Cardiovascular Auscultation:Rythm- Regular. Murmurs & Other Heart Sounds:Auscultation of the heart reveals- No Murmurs.  AbdomenInspection:-Inspeection Normal. Palpation/Percussion:Note:No mass. Palpation and Percussion of the abdomen reveal- Non Tender, Non Distended + BS, no rebound or guarding.   Neurologic Cranial Nerve exam:- CN III-XII intact(No nystagmus), symmetric smile. Strength:- 5/5 equal and symmetric strength both upper and lower extremities.  Genital- rt groin area. No hernia on exam.  Rt shoulder- decrease ROM. Difficulty lifting arm above shoulder level.  Neck- faint rt paracervical spine pain on palpation  Back- faint para thoracic area tenderness to palpation.  Skin- no inspection. No rash seen on his flank.      Assessment & Plan:  For our neck pain will watch closely to see if you have more radiating features if so will refer back to your prior specialist.  For shoulder pain will get xray and see if need to refer to ortho for evaluation of rotator cuff.  For groin pain rt side if you notice any bulge at all let us know as hernias can re-occur but none felt today. Also let us know if any constant pain in that area as appendix also in that region.  For weight loss will refer to weight loss clinic but specify that you are interested more in nutrition rather than tablets.  For pain can use low dose tylenol or low dose ibuprofen. Or combination of you try one and pain persists.  Follow up in 3-4 weeks or  as needed  Mackie Pai, [employer]

## 2016-10-01 ENCOUNTER — Other Ambulatory Visit: Payer: Self-pay | Admitting: Medical

## 2016-10-01 ENCOUNTER — Other Ambulatory Visit: Payer: Self-pay | Admitting: Physician Assistant

## 2016-10-01 NOTE — Telephone Encounter (Signed)
Will you call pt and make sure he has been taking his hctz. His blood pressure was very good on last visit. Looks like he would of run out of that me in February. So want to know if he had been on. I got a refill reques for hctz but based on last visit bp was wondering if he was on that tablet. If he has not been on hctz for months and bp well controlled was questioning if he needs refill??

## 2016-10-02 NOTE — Telephone Encounter (Signed)
Did you route my message back to me? Would you see message I sent to [facility].  No one gave me answer to my question?

## 2016-10-03 ENCOUNTER — Telehealth: Payer: Self-pay | Admitting: Medical

## 2016-10-03 DIAGNOSIS — R3911 Hesitancy of micturition: Secondary | ICD-10-CM

## 2016-10-03 DIAGNOSIS — R300 Dysuria: Secondary | ICD-10-CM

## 2016-10-03 DIAGNOSIS — R350 Frequency of micturition: Secondary | ICD-10-CM

## 2016-10-03 MED ORDER — SIMVASTATIN 40 MG PO TABS: 40.0000 mg | ORAL_TABLET | Freq: Every evening | ORAL | 1 refills | Status: DC

## 2016-10-03 MED ORDER — PANTOPRAZOLE SODIUM 40 MG PO TBEC: 40.0000 mg | DELAYED_RELEASE_TABLET | Freq: Two times a day (BID) | ORAL | 3 refills | Status: DC

## 2016-10-03 MED ORDER — HYDROCHLOROTHIAZIDE 12.5 MG PO CAPS: 12.5000 mg | ORAL_CAPSULE | Freq: Every day | ORAL | 1 refills | Status: DC

## 2016-10-03 MED ORDER — TIMOLOL MALEATE 0.5 % OP SOLN: 1.0000 [drp] | Freq: Every day | OPHTHALMIC | 12 refills | Status: AC

## 2016-10-03 MED ORDER — TAMSULOSIN HCL 0.4 MG PO CAPS: 0.4000 mg | ORAL_CAPSULE | Freq: Every day | ORAL | 1 refills | Status: DC

## 2016-10-03 NOTE — Telephone Encounter (Signed)
Pt states that he has been taking hctz.

## 2016-10-03 NOTE — Telephone Encounter (Signed)
Refilled his medications today.

## 2016-10-03 NOTE — Telephone Encounter (Signed)
Caller name: Clayton  Relation to pt: self Call back number: [phone] Pharmacy: [facility], [place]  Reason for call: Pt is needing refill on traMADol (ULTRAM) 50 MG tablet, hydrochlorothiazide (MICROZIDE) 12.5 MG capsule, pantoprazole (PROTONIX) 40 MG tablet, simvastatin (ZOCOR) 40 MG tablet and tamsulosin (FLOMAX) 0.4 MG CAPS capsule. Please advise.

## 2016-10-03 NOTE — Telephone Encounter (Signed)
Pt is requesting pantoprazole and its listed as a historical  Medication. Please advise.

## 2016-10-22 ENCOUNTER — Ambulatory Visit (INDEPENDENT_AMBULATORY_CARE_PROVIDER_SITE_OTHER): Payer: MEDICARE | Admitting: Medical

## 2016-10-22 ENCOUNTER — Ambulatory Visit (HOSPITAL_BASED_OUTPATIENT_CLINIC_OR_DEPARTMENT_OTHER)
Admission: RE | Admit: 2016-10-22 | Discharge: 2016-10-22 | Disposition: A | Discharge status: Home / Self Care | Payer: MEDICARE | Source: Ambulatory Visit | Attending: Medical | Admitting: Medical

## 2016-10-22 ENCOUNTER — Encounter: Payer: Self-pay | Admitting: Medical

## 2016-10-22 VITALS — BP 120/68 | HR 70 | Temp 97.9°F | Wt 181.6 lb

## 2016-10-22 DIAGNOSIS — M25521 Pain in right elbow: Secondary | ICD-10-CM

## 2016-10-22 DIAGNOSIS — Z9181 History of falling: Secondary | ICD-10-CM

## 2016-10-22 DIAGNOSIS — R300 Dysuria: Secondary | ICD-10-CM

## 2016-10-22 DIAGNOSIS — M25511 Pain in right shoulder: Secondary | ICD-10-CM

## 2016-10-22 DIAGNOSIS — M79631 Pain in right forearm: Secondary | ICD-10-CM

## 2016-10-22 DIAGNOSIS — M545 Low back pain, unspecified: Secondary | ICD-10-CM

## 2016-10-22 DIAGNOSIS — M19021 Primary osteoarthritis, right elbow: Secondary | ICD-10-CM | POA: Insufficient documentation

## 2016-10-22 DIAGNOSIS — M47816 Spondylosis without myelopathy or radiculopathy, lumbar region: Secondary | ICD-10-CM | POA: Diagnosis not present

## 2016-10-22 DIAGNOSIS — R350 Frequency of micturition: Secondary | ICD-10-CM | POA: Diagnosis not present

## 2016-10-22 DIAGNOSIS — R42 Dizziness and giddiness: Secondary | ICD-10-CM

## 2016-10-22 DIAGNOSIS — M5136 Other intervertebral disc degeneration, lumbar region: Secondary | ICD-10-CM | POA: Insufficient documentation

## 2016-10-22 DIAGNOSIS — M4316 Spondylolisthesis, lumbar region: Secondary | ICD-10-CM | POA: Insufficient documentation

## 2016-10-22 DIAGNOSIS — S46811A Strain of other muscles, fascia and tendons at shoulder and upper arm level, right arm, initial encounter: Secondary | ICD-10-CM

## 2016-10-22 LAB — PSA: PSA: 0.79 ng/mL (ref 0.10–4.00)

## 2016-10-22 MED ORDER — CYCLOBENZAPRINE HCL 5 MG PO TABS: ORAL_TABLET | ORAL | 0 refills | Status: DC

## 2016-10-22 MED ORDER — SIMVASTATIN 40 MG PO TABS: 40.0000 mg | ORAL_TABLET | Freq: Every evening | ORAL | 3 refills | Status: DC

## 2016-10-22 MED ORDER — CIPROFLOXACIN HCL 500 MG PO TABS: 500.0000 mg | ORAL_TABLET | Freq: Two times a day (BID) | ORAL | 0 refills | Status: DC

## 2016-10-22 MED ORDER — TRAMADOL HCL 50 MG PO TABS: 50.0000 mg | ORAL_TABLET | Freq: Three times a day (TID) | ORAL | 0 refills | Status: DC | PRN

## 2016-10-22 MED ORDER — HYDROCHLOROTHIAZIDE 12.5 MG PO CAPS: 12.5000 mg | ORAL_CAPSULE | Freq: Every day | ORAL | 3 refills | Status: DC

## 2016-10-22 MED ORDER — PANTOPRAZOLE SODIUM 40 MG PO TBEC: 40.0000 mg | DELAYED_RELEASE_TABLET | Freq: Two times a day (BID) | ORAL | 3 refills | Status: DC

## 2016-10-22 MED FILL — CIPROFLOXACIN HCL 500 MG TA: 500 | 7 days supply | Qty: 14 | Fill #0

## 2016-10-22 MED FILL — traMADol HCL 50 MG TABS: 50 | 4 days supply | Qty: 12 | Fill #0

## 2016-10-22 NOTE — Addendum Note (Signed)
Addended by: Hinton Dyer on: 10/22/2016 08:45 AM   Modules accepted: Orders

## 2016-10-22 NOTE — Telephone Encounter (Signed)
[phone] placed. Will you do culture on urine. Place order and I will sign.

## 2016-10-22 NOTE — Patient Instructions (Signed)
For your areas of pain post fall will get xrays. Provided no shoulder fx. Would try to do range of motion exercises as tolerated.  Can take low dose ibuprofen for moderate pain. For more severe pain tramadol. Also will make low dose flexeril to use at night.  For frequent urination will get ua and do culture. Also will get psa today. Pending studies cipro rx.  Your dizziness is mild today but you have had recent falls. I will refer you to physical therapy for gait evaluation.  Decided not to due ct of head. Note last ct of head was negative and no head trauma.   Follow up 10 days or as needed.

## 2016-10-22 NOTE — Progress Notes (Addendum)
Subjective:    Patient ID: Brian [name], male    DOB: 1945/09/02, 71 y.o.   MRN: [id number]  HPI  Pt in for fall. He states he was cleaning out a fryer. He forgot he had dish washer  Door open. He turn quickly tripping/ landing  on dish washer door.  When he fell he broke the door. He tried to break fall with rt upper ext. Since then his rt shoulder, elbow, forearm, trapezius and lower back pain. Shoulder pain is the worse.   Pt states feeling mild dizzy off balance since Thursday. This was before the fall. Not severe but slight. No ha or gross motor/sensory function deficits.  Pt has onset of burning on urination when he urinates for one day. He states urinates about 10 times a day. No fever, no chills or sweats.  No head trauma. No loc on fall.    Review of Systems  Constitutional: Negative for chills and fatigue.  HENT: Negative for congestion, drooling, ear discharge, postnasal drip, sinus pain and sinus pressure.   Respiratory: Negative for cough, choking, shortness of breath and wheezing.   Cardiovascular: Negative for palpitations.  Gastrointestinal: Negative for abdominal pain, blood in stool and constipation.  Genitourinary: Positive for dysuria and frequency. Negative for difficulty urinating, flank pain, penile swelling and urgency.  Musculoskeletal: Positive for back pain.  Neurological: Negative for facial asymmetry and light-headedness.  Hematological: Negative for adenopathy. Does not bruise/bleed easily.  Psychiatric/Behavioral: Negative for behavioral problems, confusion, decreased concentration and dysphoric mood.    Past Medical History:  Diagnosis Date  . Atypical chest pain     Negative cardiac catheterization 2014  . 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 Blind right eye   . 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 Chicken pox   . Chronic back pain   . Dislocation, jaw 1960s  . Edema    Left Leg  . Essential hypertension, benign   . GERD  (gastroesophageal reflux disease)   . [place] measles   . Heart attack ([place])   . Hyperlipidemia   . Measles   . Mumps   . Osteopenia 11.12.10   bone density test  . Personal history of other diseases of digestive system 08.10.12   Gastric ulcer     Social History   Social History  . Marital status: Single    Spouse name: N/A  . Number of children: N/A  . Years of education: N/A   Occupational History  . Not on file.   Social History Main Topics  . Smoking status: Former Smoker    Packs/day: 0.25    Years: 2.00    Types: Cigarettes    Quit date: 06/02/1966  . Smokeless tobacco: Never Used  . Alcohol use No  . Drug use: No  . Sexual activity: No   Other Topics Concern  . Not on file   Social History Narrative  . No narrative on file    Past Surgical History:  Procedure Laterality Date  . ABDOMINAL HERNIA REPAIR  1972  . APPENDECTOMY  1973  . CATARACT EXTRACTION, BILATERAL  2005  . ESOPHAGOGASTRODUODENOSCOPY  06.2011  . EYE SURGERY  1972   Bilateral Lens Replacement  . FACIAL RECONSTRUCTION SURGERY  2008  . LEFT HEART CATH  02.17.2014  . [place], 2008   Dislocated Jaw  . PARS PLANA VITRECTOMY W/ REPAIR OF MACULAR HOLE  2005   macular hole repair, right eye  . PARS PLANA VITRECTOMY  W/ REPAIR OF MACULAR HOLE  2010   mechanical, left eye  . PROSTATE BIOPSY  [place] 2017   w/ Dr. Nevada Crane  . WISDOM TOOTH EXTRACTION      Family History  Problem Relation Age of Onset  . Alzheimer's disease Mother        Deceased  . Diabetes Maternal Grandmother   . Heart attack Maternal Grandmother   . Prostate cancer Maternal Uncle   . Diabetes Maternal Uncle   . Healthy Son   . Cataracts Maternal Aunt     Allergies  Allergen Reactions  . Gabapentin Other (See Comments)    Feel "weird"; Nausea; Weakness; Syncope    Current Outpatient Prescriptions on File Prior to Visit  Medication Sig Dispense Refill  . albuterol (PROVENTIL HFA;VENTOLIN HFA) 108 (90  Base) MCG/ACT inhaler Inhale 2 puffs into the lungs every 6 (six) hours as needed for wheezing or shortness of breath. 1 Inhaler 0  . aspirin (ASPIR-81) 81 MG EC tablet Take 1 tablet by mouth daily.    Marland Kitchen b complex vitamins tablet Take 1 tablet by mouth daily.    . Calcium Carbonate-Vitamin D (CALCIUM-VITAMIN D3 PO) Take by mouth daily.    Marland Kitchen ezetimibe (ZETIA) 10 MG tablet Take 1 tablet (10 mg total) by mouth daily. 30 tablet 2  . fluticasone (FLOVENT HFA) 110 MCG/ACT inhaler Inhale 2 puffs into the lungs 2 (two) times daily at 10 AM and 5 PM. 1 Inhaler 5  . hydrochlorothiazide (MICROZIDE) 12.5 MG capsule Take 1 capsule (12.5 mg total) by mouth daily. 90 capsule 1  . levocetirizine (XYZAL) 5 MG tablet Take 1 tablet (5 mg total) by mouth every evening. 90 tablet 0  . mirabegron ER (MYRBETRIQ) 50 MG TB24 tablet Take 50 mg by mouth daily.    . pantoprazole (PROTONIX) 40 MG tablet Take 1 tablet (40 mg total) by mouth 2 (two) times daily. 60 tablet 3  . ranitidine (ZANTAC) 150 MG capsule Take 1 capsule (150 mg total) by mouth 2 (two) times daily. 60 capsule 0  . simvastatin (ZOCOR) 40 MG tablet Take 1 tablet (40 mg total) by mouth every evening. 90 tablet 1  . tamsulosin (FLOMAX) 0.4 MG CAPS capsule Take 1 capsule (0.4 mg total) by mouth daily. 90 capsule 1  . timolol (TIMOPTIC) 0.5 % ophthalmic solution Place 1 drop into the left eye daily. 10 mL 12   No current facility-administered medications on file prior to visit.     BP 120/68   Pulse 70   Wt 181 lb 9.6 oz (82.4 kg)   SpO2 98%   BMI 29.31 kg/m       Objective:   Physical Exam  General Mental Status- Alert. General Appearance- Not in acute distress.   Skin General: Color- Normal Color. Moisture- Normal Moisture.  Neck Carotid Arteries- Normal color. Moisture- Normal Moisture. No carotid bruits. No JVD. Rt side trapezius pain on palpation.   Chest and Lung Exam Auscultation: Breath  Sounds:-Normal.  Cardiovascular Auscultation:Rythm- Regular. Murmurs & Other Heart Sounds:Auscultation of the heart reveals- No Murmurs.  Abdomen Inspection:-Inspeection Normal. Palpation/Percussion:Note:No mass. Palpation and Percussion of the abdomen reveal- Non Tender, Non Distended + BS, no rebound or guarding.    Neurologic Cranial Nerve exam:- CN III-XII intact(No nystagmus), symmetric smile. Drift Test:- No drift. Finger to Nose:- Normal/Intact Strength:- 5/5 equal and symmetric strength both upper and lower extremities.  Rt shoulder- pain on palpation. Pain on range of motion. Restricted due pain. Can abduct past shoulder  level. Can't rotate upper ext/shoulder.  Rt elbow- mild pain on flexion and extension. Rt forearm- mild pain on palpation. Rt wrist and hand- no pain.  Back- mid lumbar pain on palpation. Normal l5-s1 sensation intact     Assessment & Plan:  For your areas of pain post fall will get xrays. Provided no shoulder fx. Would try to do range of motion exercises as tolerated.  Can take low dose ibuprofen for moderate pain. For more severe pain tramadol. Also will make low dose flexeril to use at night.  For frequent urination will get ua and do culture. Also will get psa today. Pending studies cipro rx.  Your dizziness is mild today but you have had recent falls. I will refer you to physical therapy for gait evaluation.  Decided not to due ct of head. Note last ct of head was negative and no head trauma.   Follow up 10 days or as needed.  Breeona Waid, Percell Miller, PA-C

## 2016-10-23 LAB — URINE CULTURE: Organism ID, Bacteria: NO GROWTH

## 2016-10-28 NOTE — Telephone Encounter (Signed)
referal to urolologist placed.

## 2016-10-29 ENCOUNTER — Ambulatory Visit: Payer: MEDICARE | Attending: Medical | Admitting: Physical Therapy

## 2016-10-29 DIAGNOSIS — R2689 Other abnormalities of gait and mobility: Secondary | ICD-10-CM | POA: Insufficient documentation

## 2016-10-29 DIAGNOSIS — M545 Low back pain: Secondary | ICD-10-CM | POA: Diagnosis present

## 2016-10-29 DIAGNOSIS — R2681 Unsteadiness on feet: Secondary | ICD-10-CM | POA: Insufficient documentation

## 2016-10-29 DIAGNOSIS — M25511 Pain in right shoulder: Secondary | ICD-10-CM | POA: Diagnosis present

## 2016-10-29 DIAGNOSIS — G8929 Other chronic pain: Secondary | ICD-10-CM | POA: Diagnosis present

## 2016-10-29 NOTE — Therapy (Signed)
[facility] [address]  [place], [place], [address] Phone: [phone]   Fax:  [phone]  Physical Therapy Evaluation  Patient Details  Name: Brian [name] MRN: [id number] Date of Birth: [birth year] Referring Provider: Mackie Pai, PA-C  Encounter Date: 10/29/2016      PT End of Session - 10/29/16 1021    Visit Number 1   Number of Visits 16   Date for PT Re-Evaluation 12/24/16   Authorization Type Medicare   PT Start Time 0931   PT Stop Time 1014   PT Time Calculation (min) 43 min   Activity Tolerance Patient tolerated treatment well   Behavior During Therapy [facility] for tasks assessed/performed      Past Medical History:  Diagnosis Date  . Atypical chest pain     Negative cardiac catheterization 2014  . 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 Blind right eye   . 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 Chicken pox   . Chronic back pain   . Dislocation, jaw 1960s  . Edema    Left Leg  . Essential hypertension, benign   . GERD (gastroesophageal reflux disease)   . [place] measles   . Heart attack ([place])   . Hyperlipidemia   . Measles   . Mumps   . Osteopenia 11.12.10   bone density test  . Personal history of other diseases of digestive system 08.10.12   Gastric ulcer    Past Surgical History:  Procedure Laterality Date  . ABDOMINAL HERNIA REPAIR  1972  . APPENDECTOMY  1973  . CATARACT EXTRACTION, BILATERAL  2005  . ESOPHAGOGASTRODUODENOSCOPY  06.2011  . EYE SURGERY  1972   Bilateral Lens Replacement  . FACIAL RECONSTRUCTION SURGERY  2008  . LEFT HEART CATH  02.17.2014  . [place], 2008   Dislocated Jaw  . PARS PLANA VITRECTOMY W/ REPAIR OF MACULAR HOLE  2005   macular hole repair, right eye  . PARS PLANA VITRECTOMY W/ REPAIR OF MACULAR HOLE  2010   mechanical, left eye  . PROSTATE BIOPSY  [place] 2017   w/ Dr. Nevada Crane  . WISDOM TOOTH EXTRACTION      There  were no vitals filed for this visit.       Subjective Assessment - 10/29/16 0933    Subjective Patient reporting a history of back problems as well as R shoulder pain. Patient with noted fall - had dishwasher door open, forgot and turned and reached and fell - broke dishwasher door. Pain with overhead movements. Back pain has been present for over 1 year - has had epidural injections with little relief.    How long can you stand comfortably? 10 minutes   How long can you walk comfortably? feels like balance is poor   Patient Stated Goals reduce pain, wean from medication   Currently in Pain? Yes   Pain Score 5    Pain Location Shoulder   Pain Orientation Right   Pain Descriptors / Indicators Aching;Sore   Pain Type Acute pain   Pain Onset 1 to 4 weeks ago   Aggravating Factors  overhead movements, increased activity   Pain Relieving Factors nothing   Multiple Pain Sites Yes   Pain Score 5   Pain Location Back   Pain Orientation Right;Left;Lower   Pain Descriptors / Indicators Aching;Dull   Pain Radiating Towards Pain radiation into R flank/lateral trunk   Pain Onset More than a month ago  Pain Frequency Intermittent   Aggravating Factors  walking, standing   Pain Relieving Factors nothing            [facility] PT Assessment - 10/29/16 0942      Assessment   Medical Diagnosis History of falls   Referring Provider Mackie Pai, PA-C   Next MD Visit prn   Prior Therapy yes - not for this issue     Precautions   Precautions None     Restrictions   Weight Bearing Restrictions No     Balance Screen   Has the patient fallen in the past 6 months Yes   How many times? 1   Has the patient had a decrease in activity level because of a fear of falling?  No   Is the patient reluctant to leave their home because of a fear of falling?  No     Home Environment   Living Environment Private residence   Living Arrangements Other relatives   Type of [place] to enter   Home Layout Two level   Alternate Level Stairs-Number of Steps 14   Additional Comments patient reports SOB after climbing stairs     Prior Function   Level of Independence Independent   Vocation Retired   Leisure cooking     Cognition   Overall Cognitive Status Within Functional Limits for tasks assessed     Observation/Other Assessments   Focus on Therapeutic Outcomes (FOTO)  Neuro: 64 (36% limited, predicted 24% limited)     Sensation   Light Touch Appears Intact     Coordination   Gross Motor Movements are Fluid and Coordinated Yes     Posture/Postural Control   Posture/Postural Control Postural limitations   Postural Limitations Rounded Shoulders;Forward head     ROM / Strength   AROM / PROM / Strength AROM;Strength     AROM   Overall AROM  Within functional limits for tasks performed   Overall AROM Comments B LE; B UE WNL, however R shoulder pain at all end ranges with IR most limited/painful   AROM Assessment Site --   Right/Left Shoulder --     Strength   Overall Strength Comments B LE grossly 4/5; pain in R shoulder with all position testing   Strength Assessment Site Shoulder   Right/Left Shoulder Right   Right Shoulder Flexion 4-/5   Right Shoulder ABduction 4-/5   Right Shoulder Internal Rotation 4-/5   Right Shoulder External Rotation 3+/5     Palpation   Palpation comment some pain in R UT, R deltoid, as well as R AC joint line     Special Tests    Special Tests Rotator Cuff Impingement   Rotator Cuff Impingment tests Michel Bickers test     Hawkins-Kennedy test   Findings Positive   Side Right     Ambulation/Gait   Gait velocity 3.03 feet/sec     Standardized Balance Assessment   Standardized Balance Assessment Berg Balance Test;Timed Up and Go Test;Five Times Sit to Stand   Five times sit to stand comments  15.85     Berg Balance Test   Sit to Stand Able to stand without using hands and stabilize independently    Standing Unsupported Able to stand safely 2 minutes   Sitting with Back Unsupported but Feet Supported on Floor or Stool Able to sit safely and securely 2 minutes   Stand to Sit Sits safely with minimal use of hands  Transfers Able to transfer safely, minor use of hands   Standing Unsupported with Eyes Closed Able to stand 10 seconds with supervision   Standing Ubsupported with Feet Together Able to place feet together independently and stand for 1 minute with supervision   From Standing, Reach Forward with Outstretched Arm Can reach forward >12 cm safely (5")   From Standing Position, Pick up Object from [place] to pick up shoe, needs supervision   From Standing Position, Turn to Look Behind Over each Shoulder Looks behind from both sides and weight shifts well   Turn 360 Degrees Able to turn 360 degrees safely in 4 seconds or less   Standing Unsupported, Alternately Place Feet on Step/Stool Able to stand independently and safely and complete 8 steps in 20 seconds   Standing Unsupported, One Foot in Front Needs help to step but can hold 15 seconds   Standing on One Leg Tries to lift leg/unable to hold 3 seconds but remains standing independently   Total Score 46     Timed Up and Go Test   Normal TUG (seconds) 10.44   Manual TUG (seconds) 10.57   Cognitive TUG (seconds) 15.44            Objective measurements completed on examination: See above findings.                  PT Education - 10/29/16 1020    Education provided Yes   Education Details exam findings, POC, HEP   Person(s) Educated Patient   Methods Explanation   Comprehension Verbalized understanding          PT Short Term Goals - 10/29/16 1030      PT SHORT TERM GOAL #1   Title Patient to improve R shoulder AROM to WNL without pain (11/26/16)   Status New     PT SHORT TERM GOAL #2   Title Patient to improve Berg Balance to 51/56 (11/26/16)   Status New           PT Long Term Goals -  10/29/16 1031      PT LONG TERM GOAL #1   Title Patient to be independent with advacned HEP (12/24/16)   Status New     PT LONG TERM GOAL #2   Title Patient to improve R UE gross strength to 4+/5 without pain (12/24/16)   Status New     PT LONG TERM GOAL #3   Title Patient to demonstrate ability to ambulate over various surfaces for > 500 feet without LOB or fall demonstrating improved balance and functional mobility (12/24/16)   Status New     PT LONG TERM GOAL #4   Title Patient to demonstrate ability to reach overhead without pain (12/24/16)   Status New     PT LONG TERM GOAL #5   Title Patient to report reduction in LBP by 50% (12/24/16)   Status New                Plan - 10/29/16 1022    Clinical Impression Statement Brian [name] is a 71 y/o male presenting to [facility] today for initial evaluation s/p fall over dishwasher, where patient reports he landed onto R shoulder/hand, as well as persistent back pain. Patient does report artificial R eye, and reduced vision of L eye. Balance testing done today with patient scoring in Moderate fall risk on Berg Balance, however, within normal limits for 5x sit to stand, TUG, and gait speed. AROM of R shoulder  WNL, however pain at end ranges in all directions, as well as pain with MMT. Flexibility unable to be tested today due to time constraint, so will plan to assess this at next visit. Patient to benefit from PT to address R shoulder pain, low back pain, and some balance to improve funcitonal mobility and overall quality of life.    Clinical Presentation Stable   Clinical Decision Making Low   Rehab Potential Good   PT Frequency 2x / week   PT Duration 8 weeks   PT Treatment/Interventions ADLs/Self Care Home Management;Cryotherapy;Electrical Stimulation;Iontophoresis 4mg /ml Dexamethasone;Moist Heat;Traction;Ultrasound;Neuromuscular re-education;Balance training;Therapeutic exercise;Therapeutic activities;Functional mobility training;Stair  training;Patient/family education;Manual techniques;Passive range of motion;Vasopneumatic Device;Taping;Dry needling   Consulted and Agree with Plan of Care Patient      Patient will benefit from skilled therapeutic intervention in order to improve the following deficits and impairments:  Decreased activity tolerance, Decreased balance, Decreased range of motion, Decreased mobility, Decreased strength, Impaired UE functional use, Pain  Visit Diagnosis: Other abnormalities of gait and mobility  Acute pain of right shoulder  Chronic low back pain, unspecified back pain laterality, with sciatica presence unspecified  Unsteadiness on feet      G-Codes - 11/24/2016 1045    Functional Assessment Tool Used (Outpatient Only) FOTO: 64 (36% limited)   Functional Limitation Mobility: Walking and moving around   Mobility: Walking and Moving Around Current Status [phone]) At least 20 percent but less than 40 percent impaired, limited or restricted   Mobility: Walking and Moving Around Goal Status [phone]) At least 20 percent but less than 40 percent impaired, limited or restricted       Problem List Patient Active Problem List   Diagnosis Date Noted  . Left shoulder pain 05/19/2016  . Urinary hesitancy 10/18/2014  . Trapezius muscle spasm 10/18/2014  . Idiopathic neuropathy 10/18/2014  . Allergic reaction 09/28/2014  . Hyperglycemia 09/28/2014  . Acute bacterial bronchitis 08/18/2014  . Hematuria 08/18/2014  . Screening, ischemic heart disease 06/16/2014  . Lumbar radiculopathy, chronic 05/19/2014  . Midline thoracic back pain 03/24/2014  . BPPV (benign paroxysmal positional vertigo) 01/20/2014  . Right-sided low back pain with right-sided sciatica 12/21/2013  . Lightheadedness 09/22/2013  . Porokeratosis 02/25/2013  . Pain in joint, ankle and foot 02/25/2013  . Lumbosacral pain 02/15/2013  . Abdominal pain, epigastric 02/15/2013  . Acid reflux 02/15/2013  . Hearing loss 02/15/2013   . Preventive measure 02/15/2013  . Chest pain 07/19/2012  . Bradycardia, sinus 07/19/2012  . Borderline glaucoma 03/15/2012  . 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 Dislocated inferior maxilla 01/10/2011  . H/O gastric ulcer 01/10/2011  . Back pain, chronic 11/21/2010      Lanney Gins, PT, DPT 2016-11-24 10:46 AM   [facility] [address]  [place], [place], [address] Phone: [phone]   Fax:  [phone]  Name: Brian [name] MRN: [id number] Date of Birth: 06-01-1946

## 2016-10-31 ENCOUNTER — Ambulatory Visit: Payer: MEDICARE | Attending: Medical | Admitting: Rehabilitation

## 2016-10-31 ENCOUNTER — Encounter: Payer: Self-pay | Admitting: Rehabilitation

## 2016-10-31 DIAGNOSIS — G8929 Other chronic pain: Secondary | ICD-10-CM | POA: Diagnosis present

## 2016-10-31 DIAGNOSIS — R2681 Unsteadiness on feet: Secondary | ICD-10-CM | POA: Insufficient documentation

## 2016-10-31 DIAGNOSIS — R2689 Other abnormalities of gait and mobility: Secondary | ICD-10-CM | POA: Insufficient documentation

## 2016-10-31 DIAGNOSIS — M545 Low back pain: Secondary | ICD-10-CM | POA: Insufficient documentation

## 2016-10-31 DIAGNOSIS — M25511 Pain in right shoulder: Secondary | ICD-10-CM | POA: Diagnosis present

## 2016-10-31 NOTE — Therapy (Signed)
[facility] [address]  [place], [place], [address] Phone: [phone]   Fax:  [phone]  Physical Therapy Treatment  Patient Details  Name: Brian [name] MRN: [id number] Date of Birth: 06-15-1945 Referring Provider: Mackie Pai, PA-C  Encounter Date: 10/31/2016      PT End of Session - 10/31/16 0853    Visit Number 2   Number of Visits 16   Date for PT Re-Evaluation 12/24/16   PT Start Time 0840   PT Stop Time 0943   PT Time Calculation (min) 63 min   Activity Tolerance Patient tolerated treatment well      Past Medical History:  Diagnosis Date  . Atypical chest pain     Negative cardiac catheterization 2014  . 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 Blind right eye   . 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 Chicken pox   . Chronic back pain   . Dislocation, jaw 1960s  . Edema    Left Leg  . Essential hypertension, benign   . GERD (gastroesophageal reflux disease)   . [place] measles   . Heart attack ([place])   . Hyperlipidemia   . Measles   . Mumps   . Osteopenia 11.12.10   bone density test  . Personal history of other diseases of digestive system 08.10.12   Gastric ulcer    Past Surgical History:  Procedure Laterality Date  . ABDOMINAL HERNIA REPAIR  1972  . APPENDECTOMY  1973  . CATARACT EXTRACTION, BILATERAL  2005  . ESOPHAGOGASTRODUODENOSCOPY  06.2011  . EYE SURGERY  1972   Bilateral Lens Replacement  . FACIAL RECONSTRUCTION SURGERY  2008  . LEFT HEART CATH  02.17.2014  . [place], 2008   Dislocated Jaw  . PARS PLANA VITRECTOMY W/ REPAIR OF MACULAR HOLE  2005   macular hole repair, right eye  . PARS PLANA VITRECTOMY W/ REPAIR OF MACULAR HOLE  2010   mechanical, left eye  . PROSTATE BIOPSY  [place] 2017   w/ Dr. [place] Crane  . WISDOM TOOTH EXTRACTION      There were no vitals filed for this visit.      Subjective Assessment - 10/31/16 0838    Subjective "Still hurting"    Currently in Pain? Yes   Pain Score 5    Pain Location Shoulder   Pain Orientation Right   Pain Score 6   Pain Location Back            [facility] PT Assessment - 10/31/16 0001      AROM   Overall AROM Comments lumbar AROM: fingers to mid shins, extension worse with RMs but WNL, ROtation decreased by 25% bil with R sided LBP, SB wnl with R sided LBP     Flexibility   Soft Tissue Assessment /Muscle Length yes   Hamstrings to 45deg bil   Quadriceps to 100bil   Piriformis tightness bil                     [facility] Adult PT Treatment/Exercise - 10/31/16 0001      Exercises   Exercises Lumbar;Shoulder     Lumbar Exercises: Stretches   Active Hamstring Stretch 3 reps;20 seconds   Active Hamstring Stretch Limitations strap   Single Knee to Chest Stretch 2 reps;30 seconds   Single Knee to Chest Stretch Limitations opp knee bent   Double Knee to Chest Stretch 2 reps;30 seconds  Lower Trunk Rotation 3 reps;10 seconds     Shoulder Exercises: Seated   Row Both;15 reps;Theraband   Theraband Level (Shoulder Row) Level 2 (Red)   External Rotation Strengthening;Both;15 reps;Theraband   Theraband Level (Shoulder External Rotation) Level 2 (Red)   External Rotation Limitations scapular assist for education     Shoulder Exercises: IT sales professional 3 reps;30 seconds   Corner Stretch Limitations doorway     Modalities   Modalities Electrical Stimulation;Moist Heat     Moist Heat Therapy   Number Minutes Moist Heat 15 Minutes   Moist Heat Location Lumbar Spine     Electrical Stimulation   Electrical Stimulation Location Lumbar   Electrical Stimulation Action IFC   Electrical Stimulation Parameters to tolerance   Electrical Stimulation Goals Pain             Balance Exercises - 10/31/16 0916      Balance Exercises: Standing   Standing Eyes Closed Foam/compliant surface;3 reps;10 secs   Marching Limitations on foam x 60" CGA    Heel Raises Limitations heel to toe rock x 10 CGA with 2 poles LOB x 2 posteriorly           PT Education - 10/31/16 0931    Education provided Yes   Education Details HEP handout for stretching   Person(s) Educated Patient   Methods Explanation;Handout;Verbal cues;Tactile cues;Demonstration   Comprehension Verbalized understanding;Returned demonstration;Verbal cues required;Tactile cues required          PT Short Term Goals - 10/29/16 1030      PT SHORT TERM GOAL #1   Title Patient to improve R shoulder AROM to WNL without pain (11/26/16)   Status New     PT SHORT TERM GOAL #2   Title Patient to improve Berg Balance to 51/56 (11/26/16)   Status New           PT Long Term Goals - 10/29/16 1031      PT LONG TERM GOAL #1   Title Patient to be independent with advacned HEP (12/24/16)   Status New     PT LONG TERM GOAL #2   Title Patient to improve R UE gross strength to 4+/5 without pain (12/24/16)   Status New     PT LONG TERM GOAL #3   Title Patient to demonstrate ability to ambulate over various surfaces for > 500 feet without LOB or fall demonstrating improved balance and functional mobility (12/24/16)   Status New     PT LONG TERM GOAL #4   Title Patient to demonstrate ability to reach overhead without pain (12/24/16)   Status New     PT LONG TERM GOAL #5   Title Patient to report reduction in LBP by 50% (12/24/16)   Status New               Plan - 10/31/16 8299    Clinical Impression Statement Flexibility and lumbar AROM assessed today with signficant tightness in bilateral hamstrings, hips, and quads.  Lumbar AROM revealing R sided lumbar pain and stiffness with side bending, rotation, and repeated extension.  HEP addressed today with focus on stretching .     PT Frequency 2x / week   PT Duration 8 weeks   PT Treatment/Interventions ADLs/Self Care Home Management;Cryotherapy;Electrical Stimulation;Iontophoresis 4mg /ml Dexamethasone;Moist  Heat;Traction;Ultrasound;Neuromuscular re-education;Balance training;Therapeutic exercise;Therapeutic activities;Functional mobility training;Stair training;Patient/family education;Manual techniques;Passive range of motion;Vasopneumatic Device;Taping;Dry needling   PT Next Visit Plan review HEP, continue balance, core, and R shoulder mobility/strength  Consulted and Agree with Plan of Care Patient      Patient will benefit from skilled therapeutic intervention in order to improve the following deficits and impairments:  Decreased activity tolerance, Decreased balance, Decreased range of motion, Decreased mobility, Decreased strength, Impaired UE functional use, Pain  Visit Diagnosis: Other abnormalities of gait and mobility  Acute pain of right shoulder  Chronic low back pain, unspecified back pain laterality, with sciatica presence unspecified  Unsteadiness on feet     Problem List Patient Active Problem List   Diagnosis Date Noted  . Left shoulder pain 05/19/2016  . Urinary hesitancy 10/18/2014  . Trapezius muscle spasm 10/18/2014  . Idiopathic neuropathy 10/18/2014  . Allergic reaction 09/28/2014  . Hyperglycemia 09/28/2014  . Acute bacterial bronchitis 08/18/2014  . Hematuria 08/18/2014  . Screening, ischemic heart disease 06/16/2014  . Lumbar radiculopathy, chronic 05/19/2014  . Midline thoracic back pain 03/24/2014  . BPPV (benign paroxysmal positional vertigo) 01/20/2014  . Right-sided low back pain with right-sided sciatica 12/21/2013  . Lightheadedness 09/22/2013  . Porokeratosis 02/25/2013  . Pain in joint, ankle and foot 02/25/2013  . Lumbosacral pain 02/15/2013  . Abdominal pain, epigastric 02/15/2013  . Acid reflux 02/15/2013  . Hearing loss 02/15/2013  . Preventive measure 02/15/2013  . Chest pain 07/19/2012  . Bradycardia, sinus 07/19/2012  . Borderline glaucoma 03/15/2012  . 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 Dislocated  inferior maxilla 01/10/2011  . H/O gastric ulcer 01/10/2011  . Back pain, chronic 11/21/2010    Stark Bray, DPT, CMP 10/31/2016, 9:46 AM  [facility] [address]  [place], [place], [address] Phone: [phone]   Fax:  [phone]  Name: Brian [name] MRN: [id number] Date of Birth: 1945-07-24

## 2016-11-04 ENCOUNTER — Ambulatory Visit: Payer: MEDICARE

## 2016-11-04 DIAGNOSIS — M545 Low back pain: Secondary | ICD-10-CM

## 2016-11-04 DIAGNOSIS — G8929 Other chronic pain: Secondary | ICD-10-CM

## 2016-11-04 DIAGNOSIS — M25511 Pain in right shoulder: Secondary | ICD-10-CM

## 2016-11-04 DIAGNOSIS — R2689 Other abnormalities of gait and mobility: Secondary | ICD-10-CM

## 2016-11-04 DIAGNOSIS — R2681 Unsteadiness on feet: Secondary | ICD-10-CM

## 2016-11-04 NOTE — Therapy (Signed)
[facility] [address]  [place], [place], [address] Phone: [phone]   Fax:  [phone]  Physical Therapy Treatment  Patient Details  Name: Brian [name] MRN: [id number] Date of Birth: Jul 17, 1945 Referring Provider: Mackie Pai, PA-C  Encounter Date: 11/04/2016      PT End of Session - 11/04/16 0855    Visit Number 3   Number of Visits 16   Date for PT Re-Evaluation 12/24/16   PT Start Time 0842   PT Stop Time 0929   PT Time Calculation (min) 47 min   Activity Tolerance Patient tolerated treatment well      Past Medical History:  Diagnosis Date  . Atypical chest pain     Negative cardiac catheterization 2014  . 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 Blind right eye   . 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 Chicken pox   . Chronic back pain   . Dislocation, jaw 1960s  . Edema    Left Leg  . Essential hypertension, benign   . GERD (gastroesophageal reflux disease)   . [place] measles   . Heart attack ([place])   . Hyperlipidemia   . Measles   . Mumps   . Osteopenia 11.12.10   bone density test  . Personal history of other diseases of digestive system 08.10.12   Gastric ulcer    Past Surgical History:  Procedure Laterality Date  . ABDOMINAL HERNIA REPAIR  1972  . APPENDECTOMY  1973  . CATARACT EXTRACTION, BILATERAL  2005  . ESOPHAGOGASTRODUODENOSCOPY  06.2011  . EYE SURGERY  1972   Bilateral Lens Replacement  . FACIAL RECONSTRUCTION SURGERY  2008  . LEFT HEART CATH  02.17.2014  . [place], 2008   Dislocated Jaw  . PARS PLANA VITRECTOMY W/ REPAIR OF MACULAR HOLE  2005   macular hole repair, right eye  . PARS PLANA VITRECTOMY W/ REPAIR OF MACULAR HOLE  2010   mechanical, left eye  . PROSTATE BIOPSY  [place] 2017   w/ Dr. [place] Crane  . WISDOM TOOTH EXTRACTION      There were no vitals filed for this visit.      Subjective Assessment - 11/04/16 0848    Subjective Pt. noting he feels HEP activities make his back pain a little worse.     Patient Stated Goals reduce pain, wean from medication   Currently in Pain? Yes   Pain Score 8    Pain Location Shoulder   Pain Orientation Right   Pain Descriptors / Indicators Aching;Sore   Pain Type Acute pain   Pain Onset 1 to 4 weeks ago   Pain Frequency Constant   Aggravating Factors  reaching, lifting   Pain Relieving Factors nothing   Multiple Pain Sites Yes   Pain Score 5   Pain Location Back   Pain Orientation Right;Left;Lower   Pain Descriptors / Indicators Aching;Sharp   Pain Frequency Intermittent                         [facility] Adult PT Treatment/Exercise - 11/04/16 0973      Neuro Re-ed    Neuro Re-ed Details  At counter: occasional UE support; B SLS 3 x 20 sec, B tandem stance 3 x 20 sec; tandem walk forward/backwards x 2 laps down/back     Lumbar Exercises: Stretches   Passive Hamstring Stretch 2 reps;30 seconds   Passive Hamstring Stretch Limitations  with strap; bilateral    Single Knee to Chest Stretch 2 reps;30 seconds   Single Knee to Chest Stretch Limitations opp knee bent   Lower Trunk Rotation 3 reps;10 seconds   Piriformis Stretch 2 reps;30 seconds   Piriformis Stretch Limitations Figure-4, KTOS; bilateral      Lumbar Exercises: Aerobic   Stationary Bike NuStep: lvl 5, 6 min      Shoulder Exercises: Seated   External Rotation Strengthening;Both;15 reps;Theraband   Theraband Level (Shoulder External Rotation) Level 2 (Red)   External Rotation Limitations with towel rolls; constant tc for scap. squeeze     Shoulder Exercises: Standing   External Rotation Right;10 reps;Theraband   Theraband Level (Shoulder External Rotation) Level 2 (Red)  towel roll    Internal Rotation Right;10 reps;Theraband   Internal Rotation Limitations towel roll    Row Both;15 reps;Theraband   Theraband Level (Shoulder Row) Level 2 (Red)   Row Limitations tc for scap.  retraction     Manual Therapy   Manual Therapy Soft tissue mobilization;Myofascial release   Soft tissue mobilization STM to R UT due to pt. report of "tightness" here   Myofascial Release TPR to R UT just superior to mid clavicle; ttp here     Neck Exercises: Stretches   Upper Trapezius Stretch 1 rep;30 seconds   Upper Trapezius Stretch Limitations R   Other Neck Stretches R scallenes stretch x 30 sec                 PT Education - 11/04/16 0945    Education provided Yes   Education Details tandem stance, SLS    Person(s) Educated Patient   Methods Explanation;Demonstration;Verbal cues;Handout   Comprehension Verbalized understanding;Returned demonstration;Verbal cues required;Need further instruction          PT Short Term Goals - 11/04/16 0855      PT SHORT TERM GOAL #1   Title Patient to improve R shoulder AROM to WNL without pain (11/26/16)   Status On-going     PT SHORT TERM GOAL #2   Title Patient to improve Berg Balance to 51/56 (11/26/16)   Status On-going           PT Long Term Goals - 11/04/16 0856      PT LONG TERM GOAL #1   Title Patient to be independent with advacned HEP (12/24/16)   Status On-going     PT LONG TERM GOAL #2   Title Patient to improve R UE gross strength to 4+/5 without pain (12/24/16)   Status On-going     PT LONG TERM GOAL #3   Title Patient to demonstrate ability to ambulate over various surfaces for > 500 feet without LOB or fall demonstrating improved balance and functional mobility (12/24/16)   Status On-going     PT LONG TERM GOAL #4   Title Patient to demonstrate ability to reach overhead without pain (12/24/16)   Status On-going     PT LONG TERM GOAL #5   Title Patient to report reduction in LBP by 50% (12/24/16)   Status On-going               Plan - 11/04/16 0901    Clinical Impression Statement Pt. doing well today however noting some increased R shoulder pain without known trigger.  Some tone and  tenderness in R UT today thus manual STM/TPR here with some relief noted.  Pt. noting HEP activities increase LBP.  HEP reviewed today with pt. demonstrating difficulty relaxing  LE musculature with stretches.  With cueing pt. able to perform all HEP activities correctly without LBP increase in treatment.  Some gentle shoulder strengthening today with tandem, and SLS balance activities at counter.  Pt. tolerated all activities today well ending therapy with decreased pain from initial report at start of treatment.  HEP updated to include balance activities with UE support.  Will monitor tolerance to HEP and progress per pt. tolerance in coming visits.       PT Treatment/Interventions ADLs/Self Care Home Management;Cryotherapy;Electrical Stimulation;Iontophoresis 4mg /ml Dexamethasone;Moist Heat;Traction;Ultrasound;Neuromuscular re-education;Balance training;Therapeutic exercise;Therapeutic activities;Functional mobility training;Stair training;Patient/family education;Manual techniques;Passive range of motion;Vasopneumatic Device;Taping;Dry needling   PT Next Visit Plan Continue balance, core, and R shoulder mobility/strength      Patient will benefit from skilled therapeutic intervention in order to improve the following deficits and impairments:  Decreased activity tolerance, Decreased balance, Decreased range of motion, Decreased mobility, Decreased strength, Impaired UE functional use, Pain  Visit Diagnosis: Other abnormalities of gait and mobility  Acute pain of right shoulder  Chronic low back pain, unspecified back pain laterality, with sciatica presence unspecified  Unsteadiness on feet     Problem List Patient Active Problem List   Diagnosis Date Noted  . Left shoulder pain 05/19/2016  . Urinary hesitancy 10/18/2014  . Trapezius muscle spasm 10/18/2014  . Idiopathic neuropathy 10/18/2014  . Allergic reaction 09/28/2014  . Hyperglycemia 09/28/2014  . Acute bacterial bronchitis  08/18/2014  . Hematuria 08/18/2014  . Screening, ischemic heart disease 06/16/2014  . Lumbar radiculopathy, chronic 05/19/2014  . Midline thoracic back pain 03/24/2014  . BPPV (benign paroxysmal positional vertigo) 01/20/2014  . Right-sided low back pain with right-sided sciatica 12/21/2013  . Lightheadedness 09/22/2013  . Porokeratosis 02/25/2013  . Pain in joint, ankle and foot 02/25/2013  . Lumbosacral pain 02/15/2013  . Abdominal pain, epigastric 02/15/2013  . Acid reflux 02/15/2013  . Hearing loss 02/15/2013  . Preventive measure 02/15/2013  . Chest pain 07/19/2012  . Bradycardia, sinus 07/19/2012  . Borderline glaucoma 03/15/2012  . 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 Dislocated inferior maxilla 01/10/2011  . H/O gastric ulcer 01/10/2011  . Back pain, chronic 11/21/2010    Bess Harvest, PTA 11/04/16 11:19 AM  [facility] [address]  [place], [place], [address] Phone: [phone]   Fax:  [phone]  Name: REEVES MUSICK MRN: [id number] Date of Birth: [birth year]

## 2016-11-07 ENCOUNTER — Ambulatory Visit: Payer: MEDICARE

## 2016-11-07 DIAGNOSIS — M25511 Pain in right shoulder: Secondary | ICD-10-CM

## 2016-11-07 DIAGNOSIS — R2681 Unsteadiness on feet: Secondary | ICD-10-CM

## 2016-11-07 DIAGNOSIS — G8929 Other chronic pain: Secondary | ICD-10-CM

## 2016-11-07 DIAGNOSIS — R2689 Other abnormalities of gait and mobility: Secondary | ICD-10-CM | POA: Diagnosis not present

## 2016-11-07 DIAGNOSIS — M545 Low back pain: Secondary | ICD-10-CM

## 2016-11-07 NOTE — Therapy (Signed)
[facility] [address]  [place], [place], [address] Phone: [phone]   Fax:  [phone]  Physical Therapy Treatment  Patient Details  Name: Brian [name] MRN: [id number] Date of Birth: 11-13-1945 Referring Provider: Mackie Pai, PA-C  Encounter Date: 11/07/2016      PT End of Session - 11/07/16 0850    Visit Number 4   Number of Visits 16   Date for PT Re-Evaluation 12/24/16   PT Start Time 0846   PT Stop Time 0929   PT Time Calculation (min) 43 min   Activity Tolerance Patient tolerated treatment well      Past Medical History:  Diagnosis Date  . Atypical chest pain     Negative cardiac catheterization 2014  . 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 Blind right eye   . 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 Chicken pox   . Chronic back pain   . Dislocation, jaw 1960s  . Edema    Left Leg  . Essential hypertension, benign   . GERD (gastroesophageal reflux disease)   . [place] measles   . Heart attack ([place])   . Hyperlipidemia   . Measles   . Mumps   . Osteopenia 11.12.10   bone density test  . Personal history of other diseases of digestive system 08.10.12   Gastric ulcer    Past Surgical History:  Procedure Laterality Date  . ABDOMINAL HERNIA REPAIR  1972  . APPENDECTOMY  1973  . CATARACT EXTRACTION, BILATERAL  2005  . ESOPHAGOGASTRODUODENOSCOPY  06.2011  . EYE SURGERY  1972   Bilateral Lens Replacement  . FACIAL RECONSTRUCTION SURGERY  2008  . LEFT HEART CATH  02.17.2014  . [place], 2008   Dislocated Jaw  . PARS PLANA VITRECTOMY W/ REPAIR OF MACULAR HOLE  2005   macular hole repair, right eye  . PARS PLANA VITRECTOMY W/ REPAIR OF MACULAR HOLE  2010   mechanical, left eye  . PROSTATE BIOPSY  [place] 2017   w/ Dr. Nevada Crane  . WISDOM TOOTH EXTRACTION      There were no vitals filed for this visit.      Subjective Assessment - 11/07/16 0849     Subjective Pt. noting his R shoulder is his main concern today.     Patient Stated Goals reduce pain, wean from medication   Currently in Pain? Yes   Pain Score 6    Pain Location Shoulder   Pain Orientation Right   Pain Descriptors / Indicators Aching;Sore   Pain Type Acute pain   Pain Frequency Constant   Aggravating Factors  Reaching above head, lifting   Pain Relieving Factors nothing   Multiple Pain Sites Yes   Pain Score 3   Pain Location Back   Pain Orientation Right;Left;Lower   Pain Descriptors / Indicators Aching;Sharp   Pain Radiating Towards pain radiation into R flank/lateral trunk    Pain Onset More than a month ago                         [facility] Adult PT Treatment/Exercise - 11/07/16 7793      Neuro Re-ed    Neuro Re-ed Details  At counter: occasional UE support; B SLS 3 x 20 sec, B tandem stance 3 x 20 sec; tandem walk forward/backwards x 2 laps down/back     Lumbar Exercises: Stretches   Passive Hamstring Stretch 2 reps;30  seconds   Passive Hamstring Stretch Limitations with strap; bilateral    Lower Trunk Rotation 3 reps;10 seconds   Piriformis Stretch 2 reps;30 seconds   Piriformis Stretch Limitations Figure-4, KTOS; bilateral      Lumbar Exercises: Aerobic   Stationary Bike NuStep: lvl 5, 6 min      Shoulder Exercises: Standing   External Rotation Right;Theraband;15 reps   Theraband Level (Shoulder External Rotation) Level 2 (Red)  towel roll    Internal Rotation Right;Theraband;15 reps   Internal Rotation Limitations towel roll    Extension Both;10 reps;Theraband;Strengthening   Theraband Level (Shoulder Extension) Level 3 (Green)   Extension Limitations tactile cueing for full scap. retraction   Row Both;15 reps;Theraband   Theraband Level (Shoulder Row) Level 3 (Green)   Row Limitations tc for scap. retraction                PT Education - 11/07/16 0921    Education provided Yes   Education Details shoulder IR, ER with  red TB issued to pt.    Person(s) Educated Patient   Methods Explanation;Demonstration;Verbal cues;Handout   Comprehension Verbalized understanding;Returned demonstration;Verbal cues required;Need further instruction          PT Short Term Goals - 11/04/16 0855      PT SHORT TERM GOAL #1   Title Patient to improve R shoulder AROM to WNL without pain (11/26/16)   Status On-going     PT SHORT TERM GOAL #2   Title Patient to improve Berg Balance to 51/56 (11/26/16)   Status On-going           PT Long Term Goals - 11/04/16 0856      PT LONG TERM GOAL #1   Title Patient to be independent with advacned HEP (12/24/16)   Status On-going     PT LONG TERM GOAL #2   Title Patient to improve R UE gross strength to 4+/5 without pain (12/24/16)   Status On-going     PT LONG TERM GOAL #3   Title Patient to demonstrate ability to ambulate over various surfaces for > 500 feet without LOB or fall demonstrating improved balance and functional mobility (12/24/16)   Status On-going     PT LONG TERM GOAL #4   Title Patient to demonstrate ability to reach overhead without pain (12/24/16)   Status On-going     PT LONG TERM GOAL #5   Title Patient to report reduction in LBP by 50% (12/24/16)   Status On-going               Plan - 11/07/16 0850    Clinical Impression Statement Pt. doing well today noting less LBP over last few days.  R shoulder pain was pt.'s primary concern today.  Advancement of scapular/RTC strengthening activities, which was tolerated well.  HEP updated to include RTC strengthening band resistance.  Pt. noting better tolerance for HEP activities and verbalizing/demonstrating good recall today.  Performing balance HEP daily without issue and instructed to decrease UE support as he feels comfortable.       PT Treatment/Interventions ADLs/Self Care Home Management;Cryotherapy;Electrical Stimulation;Iontophoresis 4mg /ml Dexamethasone;Moist  Heat;Traction;Ultrasound;Neuromuscular re-education;Balance training;Therapeutic exercise;Therapeutic activities;Functional mobility training;Stair training;Patient/family education;Manual techniques;Passive range of motion;Vasopneumatic Device;Taping;Dry needling      Patient will benefit from skilled therapeutic intervention in order to improve the following deficits and impairments:  Decreased activity tolerance, Decreased balance, Decreased range of motion, Decreased mobility, Decreased strength, Impaired UE functional use, Pain  Visit Diagnosis: Other abnormalities of gait  and mobility  Acute pain of right shoulder  Chronic low back pain, unspecified back pain laterality, with sciatica presence unspecified  Unsteadiness on feet     Problem List Patient Active Problem List   Diagnosis Date Noted  . Left shoulder pain 05/19/2016  . Urinary hesitancy 10/18/2014  . Trapezius muscle spasm 10/18/2014  . Idiopathic neuropathy 10/18/2014  . Allergic reaction 09/28/2014  . Hyperglycemia 09/28/2014  . Acute bacterial bronchitis 08/18/2014  . Hematuria 08/18/2014  . Screening, ischemic heart disease 06/16/2014  . Lumbar radiculopathy, chronic 05/19/2014  . Midline thoracic back pain 03/24/2014  . BPPV (benign paroxysmal positional vertigo) 01/20/2014  . Right-sided low back pain with right-sided sciatica 12/21/2013  . Lightheadedness 09/22/2013  . Porokeratosis 02/25/2013  . Pain in joint, ankle and foot 02/25/2013  . Lumbosacral pain 02/15/2013  . Abdominal pain, epigastric 02/15/2013  . Acid reflux 02/15/2013  . Hearing loss 02/15/2013  . Preventive measure 02/15/2013  . Chest pain 07/19/2012  . Bradycardia, sinus 07/19/2012  . Borderline glaucoma 03/15/2012  . 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 Dislocated inferior maxilla 01/10/2011  . H/O gastric ulcer 01/10/2011  . Back pain, chronic 11/21/2010    Brian [name], PTA 11/07/16 11:58  AM  [facility] [address]  [place], [place], [address] Phone: [phone]   Fax:  [phone]  Name: Brian [name] MRN: [id number] Date of Birth: 09/23/1945

## 2016-11-11 ENCOUNTER — Ambulatory Visit: Payer: MEDICARE

## 2016-11-11 DIAGNOSIS — R2689 Other abnormalities of gait and mobility: Secondary | ICD-10-CM | POA: Diagnosis not present

## 2016-11-11 DIAGNOSIS — M545 Low back pain: Secondary | ICD-10-CM

## 2016-11-11 DIAGNOSIS — M25511 Pain in right shoulder: Secondary | ICD-10-CM

## 2016-11-11 DIAGNOSIS — R2681 Unsteadiness on feet: Secondary | ICD-10-CM

## 2016-11-11 DIAGNOSIS — G8929 Other chronic pain: Secondary | ICD-10-CM

## 2016-11-11 NOTE — Therapy (Signed)
[facility] [address]  [place], [place], [address] Phone: [phone]   Fax:  [phone]  Physical Therapy Treatment  Patient Details  Name: Brian [name] MRN: [id number] Date of Birth: [birth year] Referring Provider: Mackie Pai, PA-C  Encounter Date: 11/11/2016      PT End of Session - 11/11/16 0849    Visit Number 5   Number of Visits 16   Date for PT Re-Evaluation 12/24/16   PT Start Time 0845   PT Stop Time 0928   PT Time Calculation (min) 43 min   Activity Tolerance Patient tolerated treatment well      Past Medical History:  Diagnosis Date  . Atypical chest pain     Negative cardiac catheterization 2014  . 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 Blind right eye   . 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 Chicken pox   . Chronic back pain   . Dislocation, jaw 1960s  . Edema    Left Leg  . Essential hypertension, benign   . GERD (gastroesophageal reflux disease)   . [place] measles   . Heart attack ([place])   . Hyperlipidemia   . Measles   . Mumps   . Osteopenia 11.12.10   bone density test  . Personal history of other diseases of digestive system 08.10.12   Gastric ulcer    Past Surgical History:  Procedure Laterality Date  . ABDOMINAL HERNIA REPAIR  1972  . APPENDECTOMY  1973  . CATARACT EXTRACTION, BILATERAL  2005  . ESOPHAGOGASTRODUODENOSCOPY  06.2011  . EYE SURGERY  1972   Bilateral Lens Replacement  . FACIAL RECONSTRUCTION SURGERY  2008  . LEFT HEART CATH  02.17.2014  . [place], 2008   Dislocated Jaw  . PARS PLANA VITRECTOMY W/ REPAIR OF MACULAR HOLE  2005   macular hole repair, right eye  . PARS PLANA VITRECTOMY W/ REPAIR OF MACULAR HOLE  2010   mechanical, left eye  . PROSTATE BIOPSY  [place] 2017   w/ Dr. [place] Crane  . WISDOM TOOTH EXTRACTION      There were no vitals filed for this visit.      Subjective Assessment - 11/11/16 0903     Subjective R shoulder remains pt. greatest concern today.  Only occasional back pain now.  Reports consistently performing HEP activities.     Patient Stated Goals reduce pain, wean from medication   Currently in Pain? Yes   Pain Score 8    Pain Location Shoulder   Pain Orientation Right   Pain Descriptors / Indicators Aching;Sore   Pain Type Acute pain   Aggravating Factors  reaching above head, lifting   Pain Relieving Factors keeping the shoulder moving                         [facility] Adult PT Treatment/Exercise - 11/11/16 1660      Neuro Re-ed    Neuro Re-ed Details  B SLS x 30 sec on compliant surface 1 ski pole support, tandem walk forward backward on blue foam balance beam x 3 laps, 1 HH support; side stepping on blue foam balance beam 3 laps with cone toe tap; Narrow stance with diagonal head turns on airex pad x 30 sec each way     Lumbar Exercises: Stretches   Passive Hamstring Stretch 2 reps;30 seconds   Passive Hamstring Stretch Limitations with strap; bilateral  Single Knee to Chest Stretch 2 reps;30 seconds   Single Knee to Chest Stretch Limitations opp knee bent   Lower Trunk Rotation 3 reps;10 seconds     Lumbar Exercises: Standing   Other Standing Lumbar Exercises Alternating toe-tap to 8" step standing on airex pad x 15 reps each; close supervision       Shoulder Exercises: Standing   External Rotation Right;Theraband;20 reps   Theraband Level (Shoulder External Rotation) Level 2 (Red)  towel roll    Internal Rotation Right;Theraband;20 reps   Internal Rotation Limitations towel roll      Shoulder Exercises: ROM/Strengthening   UBE (Upper Arm Bike) UBE: lvl 2.5, 3 min each way                  PT Short Term Goals - 11/04/16 0855      PT SHORT TERM GOAL #1   Title Patient to improve R shoulder AROM to WNL without pain (11/26/16)   Status On-going     PT SHORT TERM GOAL #2   Title Patient to improve Berg Balance to 51/56  (11/26/16)   Status On-going           PT Long Term Goals - 11/04/16 0856      PT LONG TERM GOAL #1   Title Patient to be independent with advacned HEP (12/24/16)   Status On-going     PT LONG TERM GOAL #2   Title Patient to improve R UE gross strength to 4+/5 without pain (12/24/16)   Status On-going     PT LONG TERM GOAL #3   Title Patient to demonstrate ability to ambulate over various surfaces for > 500 feet without LOB or fall demonstrating improved balance and functional mobility (12/24/16)   Status On-going     PT LONG TERM GOAL #4   Title Patient to demonstrate ability to reach overhead without pain (12/24/16)   Status On-going     PT LONG TERM GOAL #5   Title Patient to report reduction in LBP by 50% (12/24/16)   Status On-going               Plan - 11/11/16 7989    Clinical Impression Statement Pt. noting R shoulder pain is his main concern today.  Treatment focusing on RTC strengthening and balance activities on compliant surfaces.  Pt. noting decrease in R shoulder pain throughout treatment.  Some difficulty with tandem stance and stepping on compliant surfaces today.  Pt. progressing well with therapy at this point.     PT Treatment/Interventions ADLs/Self Care Home Management;Cryotherapy;Electrical Stimulation;Iontophoresis 4mg /ml Dexamethasone;Moist Heat;Traction;Ultrasound;Neuromuscular re-education;Balance training;Therapeutic exercise;Therapeutic activities;Functional mobility training;Stair training;Patient/family education;Manual techniques;Passive range of motion;Vasopneumatic Device;Taping;Dry needling   PT Next Visit Plan Continue balance, core, and R shoulder mobility/strength      Patient will benefit from skilled therapeutic intervention in order to improve the following deficits and impairments:  Decreased activity tolerance, Decreased balance, Decreased range of motion, Decreased mobility, Decreased strength, Impaired UE functional use,  Pain  Visit Diagnosis: Other abnormalities of gait and mobility  Acute pain of right shoulder  Chronic low back pain, unspecified back pain laterality, with sciatica presence unspecified  Unsteadiness on feet     Problem List Patient Active Problem List   Diagnosis Date Noted  . Left shoulder pain 05/19/2016  . Urinary hesitancy 10/18/2014  . Trapezius muscle spasm 10/18/2014  . Idiopathic neuropathy 10/18/2014  . Allergic reaction 09/28/2014  . Hyperglycemia 09/28/2014  . Acute bacterial bronchitis 08/18/2014  .  Hematuria 08/18/2014  . Screening, ischemic heart disease 06/16/2014  . Lumbar radiculopathy, chronic 05/19/2014  . Midline thoracic back pain 03/24/2014  . BPPV (benign paroxysmal positional vertigo) 01/20/2014  . Right-sided low back pain with right-sided sciatica 12/21/2013  . Lightheadedness 09/22/2013  . Porokeratosis 02/25/2013  . Pain in joint, ankle and foot 02/25/2013  . Lumbosacral pain 02/15/2013  . Abdominal pain, epigastric 02/15/2013  . Acid reflux 02/15/2013  . Hearing loss 02/15/2013  . Preventive measure 02/15/2013  . Chest pain 07/19/2012  . Bradycardia, sinus 07/19/2012  . Borderline glaucoma 03/15/2012  . 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 Dislocated inferior maxilla 01/10/2011  . H/O gastric ulcer 01/10/2011  . Back pain, chronic 11/21/2010    Bess Harvest, PTA 11/11/16 10:42 AM  [facility] [address]  [place], [place], [address] Phone: [phone]   Fax:  [phone]  Name: JOURNEY RATTERMAN MRN: [id number] Date of Birth: [birth year]

## 2016-11-13 ENCOUNTER — Encounter: Payer: Self-pay | Admitting: Family Medicine

## 2016-11-13 ENCOUNTER — Ambulatory Visit: Payer: MEDICARE | Admitting: Physical Therapy

## 2016-11-13 ENCOUNTER — Ambulatory Visit (INDEPENDENT_AMBULATORY_CARE_PROVIDER_SITE_OTHER): Payer: MEDICARE | Admitting: Family Medicine

## 2016-11-13 VITALS — BP 118/60 | HR 53 | Temp 98.0°F | Ht 66.0 in | Wt 184.2 lb

## 2016-11-13 DIAGNOSIS — G8929 Other chronic pain: Secondary | ICD-10-CM

## 2016-11-13 DIAGNOSIS — R2681 Unsteadiness on feet: Secondary | ICD-10-CM

## 2016-11-13 DIAGNOSIS — R300 Dysuria: Secondary | ICD-10-CM | POA: Diagnosis not present

## 2016-11-13 DIAGNOSIS — R2689 Other abnormalities of gait and mobility: Secondary | ICD-10-CM

## 2016-11-13 DIAGNOSIS — M25511 Pain in right shoulder: Secondary | ICD-10-CM

## 2016-11-13 DIAGNOSIS — M545 Low back pain: Secondary | ICD-10-CM

## 2016-11-13 DIAGNOSIS — R3911 Hesitancy of micturition: Secondary | ICD-10-CM | POA: Diagnosis not present

## 2016-11-13 NOTE — Therapy (Signed)
[facility] [address]  [place], [place], [address] Phone: [phone]   Fax:  [phone]  Physical Therapy Treatment  Patient Details  Name: Brian [name] MRN: [id number] Date of Birth: Aug 06, 1945 Referring Provider: Mackie Pai, PA-C  Encounter Date: 11/13/2016      PT End of Session - 11/13/16 0847    Visit Number 6   Number of Visits 16   Date for PT Re-Evaluation 12/24/16   Authorization Type Medicare   PT Start Time 0845   PT Stop Time 0926   PT Time Calculation (min) 41 min   Activity Tolerance Patient tolerated treatment well   Behavior During Therapy [facility] for tasks assessed/performed      Past Medical History:  Diagnosis Date  . Atypical chest pain     Negative cardiac catheterization 2014  . 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 Blind right eye   . 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 Chicken pox   . Chronic back pain   . Dislocation, jaw 1960s  . Edema    Left Leg  . Essential hypertension, benign   . GERD (gastroesophageal reflux disease)   . [place] measles   . Heart attack ([place])   . Hyperlipidemia   . Measles   . Mumps   . Osteopenia 11.12.10   bone density test  . Personal history of other diseases of digestive system 08.10.12   Gastric ulcer    Past Surgical History:  Procedure Laterality Date  . ABDOMINAL HERNIA REPAIR  1972  . APPENDECTOMY  1973  . CATARACT EXTRACTION, BILATERAL  2005  . ESOPHAGOGASTRODUODENOSCOPY  06.2011  . EYE SURGERY  1972   Bilateral Lens Replacement  . FACIAL RECONSTRUCTION SURGERY  2008  . LEFT HEART CATH  02.17.2014  . [place], 2008   Dislocated Jaw  . PARS PLANA VITRECTOMY W/ REPAIR OF MACULAR HOLE  2005   macular hole repair, right eye  . PARS PLANA VITRECTOMY W/ REPAIR OF MACULAR HOLE  2010   mechanical, left eye  . PROSTATE BIOPSY  [place] 2017   w/ Dr. [place] Crane  . WISDOM TOOTH EXTRACTION      There were  no vitals filed for this visit.      Subjective Assessment - 11/13/16 0846    Subjective Feeling well today - shulder feels a lot better compared to lsat visit   Patient Stated Goals reduce pain, wean from medication   Currently in Pain? Yes   Pain Score 3    Pain Location Shoulder   Pain Orientation Right   Pain Descriptors / Indicators Aching;Sore   Pain Type Acute pain                         [facility] Adult PT Treatment/Exercise - 11/13/16 0849      Shoulder Exercises: Seated   Other Seated Exercises scaption to shoulder level - 1# each hand x 15 reps     Shoulder Exercises: Sidelying   External Rotation Strengthening;Right;15 reps;Weights   External Rotation Weight (lbs) 3   ABduction Strengthening;Right;15 reps;Weights   ABduction Weight (lbs) 3     Shoulder Exercises: Standing   External Rotation Strengthening;Right;15 reps;Theraband   Theraband Level (Shoulder External Rotation) Level 3 (Green)   External Rotation Limitations towel roll   Internal Rotation Strengthening;Right;15 reps;Theraband   Theraband Level (Shoulder Internal Rotation) Level 3 (Green)   Internal Rotation Limitations towel  roll    Extension Strengthening;Both;15 reps;Theraband   Theraband Level (Shoulder Extension) Level 3 (Green)   Extension Limitations with scap squeeze   Row Both;15 reps;Theraband   Theraband Level (Shoulder Row) Level 3 (Green)   Row Limitations VC to reduce shoulder hiking   Other Standing Exercises 2 level cabinet reaches - flexion and abduction - 2# x 10 each     Shoulder Exercises: ROM/Strengthening   UBE (Upper Arm Bike) level 3 x 6 min (3/3)     Shoulder Exercises: Stretch   Other Shoulder Stretches B UT stretch 1 x 30 sec each side   Other Shoulder Stretches B LS stretch 1 x 30 second each side     Manual Therapy   Manual Therapy Soft tissue mobilization   Soft tissue mobilization STM to R posterior shoulder complex - trigger pointed noted in  rhomboid and UT with slight tenderness in these areas                  PT Short Term Goals - 11/04/16 0855      PT SHORT TERM GOAL #1   Title Patient to improve R shoulder AROM to WNL without pain (11/26/16)   Status On-going     PT SHORT TERM GOAL #2   Title Patient to improve Berg Balance to 51/56 (11/26/16)   Status On-going           PT Long Term Goals - 11/04/16 0856      PT LONG TERM GOAL #1   Title Patient to be independent with advacned HEP (12/24/16)   Status On-going     PT LONG TERM GOAL #2   Title Patient to improve R UE gross strength to 4+/5 without pain (12/24/16)   Status On-going     PT LONG TERM GOAL #3   Title Patient to demonstrate ability to ambulate over various surfaces for > 500 feet without LOB or fall demonstrating improved balance and functional mobility (12/24/16)   Status On-going     PT LONG TERM GOAL #4   Title Patient to demonstrate ability to reach overhead without pain (12/24/16)   Status On-going     PT LONG TERM GOAL #5   Title Patient to report reduction in LBP by 50% (12/24/16)   Status On-going               Plan - 11/13/16 0847    Clinical Impression Statement Pain much improved today, as compared to last visit. Does continue to demonstrate tightness in R UT, R LS, and rhomboids with some tenderness to deep palpation in these areas. Patient doing well with all strengthening progressions with anticpated fatigue at R shoulder with increased weight and more overhead tasks. Will continue to benefit form PT to maximize functional use of R UE.    PT Treatment/Interventions ADLs/Self Care Home Management;Cryotherapy;Electrical Stimulation;Iontophoresis 4mg /ml Dexamethasone;Moist Heat;Traction;Ultrasound;Neuromuscular re-education;Balance training;Therapeutic exercise;Therapeutic activities;Functional mobility training;Stair training;Patient/family education;Manual techniques;Passive range of motion;Vasopneumatic Device;Taping;Dry  needling   PT Next Visit Plan Continue balance, core, and R shoulder mobility/strength   Consulted and Agree with Plan of Care Patient      Patient will benefit from skilled therapeutic intervention in order to improve the following deficits and impairments:  Decreased activity tolerance, Decreased balance, Decreased range of motion, Decreased mobility, Decreased strength, Impaired UE functional use, Pain  Visit Diagnosis: Other abnormalities of gait and mobility  Acute pain of right shoulder  Chronic low back pain, unspecified back pain laterality, with sciatica presence unspecified  Unsteadiness on feet     Problem List Patient Active Problem List   Diagnosis Date Noted  . Left shoulder pain 05/19/2016  . Urinary hesitancy 10/18/2014  . Trapezius muscle spasm 10/18/2014  . Idiopathic neuropathy 10/18/2014  . Allergic reaction 09/28/2014  . Hyperglycemia 09/28/2014  . Acute bacterial bronchitis 08/18/2014  . Hematuria 08/18/2014  . Screening, ischemic heart disease 06/16/2014  . Lumbar radiculopathy, chronic 05/19/2014  . Midline thoracic back pain 03/24/2014  . BPPV (benign paroxysmal positional vertigo) 01/20/2014  . Right-sided low back pain with right-sided sciatica 12/21/2013  . Lightheadedness 09/22/2013  . Porokeratosis 02/25/2013  . Pain in joint, ankle and foot 02/25/2013  . Lumbosacral pain 02/15/2013  . Abdominal pain, epigastric 02/15/2013  . Acid reflux 02/15/2013  . Hearing loss 02/15/2013  . Preventive measure 02/15/2013  . Chest pain 07/19/2012  . Bradycardia, sinus 07/19/2012  . Borderline glaucoma 03/15/2012  . 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 Dislocated inferior maxilla 01/10/2011  . H/O gastric ulcer 01/10/2011  . Back pain, chronic 11/21/2010    Lanney Gins, PT, DPT 11/13/16 9:28 AM   [facility] [address]  [place], [place],  [address] Phone: [phone]   Fax:  [phone]  Name: Brian [name] MRN: [id number] Date of Birth: 12-16-1945

## 2016-11-13 NOTE — Progress Notes (Signed)
Chief Complaint  Patient presents with  . Bladder issues    bladder pain-pt has an appt with Urology (2 week in July)    Brian [name] is a 71 y.o. male here for bladder pain.  Duration: 2 weeks. Symptoms: urinary frequency, dysuria, some suprapubic abd pain Denies: hematuria, Penile discharge, urinary retention, fever, nausea, vomiting and constipation Hx of recurrent UTI? No Denies new sexual partners.  ROS:  Constitutional: denies fever GU: As noted in HPI  Past Medical History:  Diagnosis Date  . Atypical chest pain     Negative cardiac catheterization 2014  . 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 Blind right eye   . 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 Chicken pox   . Chronic back pain   . Dislocation, jaw 1960s  . Edema    Left Leg  . Essential hypertension, benign   . GERD (gastroesophageal reflux disease)   . [place] measles   . Heart attack ([place])   . Hyperlipidemia   . Measles   . Mumps   . Osteopenia 11.12.10   bone density test  . Personal history of other diseases of digestive system 08.10.12   Gastric ulcer   Family History  Problem Relation Age of Onset  . Alzheimer's disease Mother        Deceased  . Diabetes Maternal Grandmother   . Heart attack Maternal Grandmother   . Prostate cancer Maternal Uncle   . Diabetes Maternal Uncle   . Healthy Son   . Cataracts Maternal Aunt    Social History   Social History  . Marital status: Single   Social History Main Topics  . Smoking status: Former Smoker    Packs/day: 0.25    Years: 2.00    Types: Cigarettes    Quit date: 06/02/1966  . Smokeless tobacco: Never Used  . Alcohol use No  . Drug use: No  . Sexual activity: No    BP 118/60 (BP Location: Left Arm, Patient Position: Sitting, Cuff Size: Normal)   Pulse (!) 53   Temp 98 F (36.7 C) (Oral)   Ht 5\' 6"  (1.676 m)   Wt 184 lb 3.2 oz (83.6 kg)   SpO2 97%   BMI 29.73 kg/m  General: Awake, alert, appears stated  age HEENT: MMM Heart: RRR, no murmurs Lungs: CTAB, normal respiratory effort, no accessory muscle usage Abd: BS+, soft, NT, ND, no masses or organomegaly MSK: No CVA tenderness, neg Lloyd's sign GU: Mild TTP over R epididymis, prostate nontender, smooth, enlarged, no lesions, no discharge, no other testicular TTP Psych: Age appropriate judgment and insight  Dysuria - Plan: tamsulosin (FLOMAX) 0.4 MG CAPS capsule, POCT Urinalysis Dipstick (Automated), Urine cytology ancillary only  Urinary hesitancy - Plan: tamsulosin (FLOMAX) 0.4 MG CAPS capsule  Will run ancillary testing. Increase dose of Flomax. Stay hydrated. Pyridium OTC also suggested. F/u with urology, [place] prn. The patient voiced understanding and agreement to the plan.  [place], DO 11/13/16 10:47 AM

## 2016-11-13 NOTE — Patient Instructions (Signed)
Bring [place] a urine sample on Tuesday.  Pyridium (Azo) is available over the counter and can be helpful for treating your symptoms.  Stay well hydrated.  Let us know if you have anything new.

## 2016-11-18 ENCOUNTER — Other Ambulatory Visit (HOSPITAL_COMMUNITY)
Admission: RE | Admit: 2016-11-18 | Discharge: 2016-11-18 | Disposition: A | Discharge status: Home / Self Care | Payer: MEDICARE | Source: Ambulatory Visit | Attending: Family Medicine | Admitting: Family Medicine

## 2016-11-18 ENCOUNTER — Ambulatory Visit: Payer: MEDICARE

## 2016-11-18 DIAGNOSIS — G8929 Other chronic pain: Secondary | ICD-10-CM

## 2016-11-18 DIAGNOSIS — R300 Dysuria: Secondary | ICD-10-CM | POA: Diagnosis present

## 2016-11-18 DIAGNOSIS — R2689 Other abnormalities of gait and mobility: Secondary | ICD-10-CM

## 2016-11-18 DIAGNOSIS — M25511 Pain in right shoulder: Secondary | ICD-10-CM

## 2016-11-18 DIAGNOSIS — R2681 Unsteadiness on feet: Secondary | ICD-10-CM

## 2016-11-18 DIAGNOSIS — M545 Low back pain: Secondary | ICD-10-CM

## 2016-11-18 LAB — POC URINALSYSI DIPSTICK (AUTOMATED)
Bilirubin, UA: NEGATIVE
Glucose, UA: NEGATIVE
KETONES UA: NEGATIVE
Leukocytes, UA: NEGATIVE
NITRITE UA: NEGATIVE
PH UA: 6 (ref 5.0–8.0)
PROTEIN UA: NEGATIVE
RBC UA: NEGATIVE
Spec Grav, UA: 1.02 (ref 1.010–1.025)
Urobilinogen, UA: 0.2 E.U./dL

## 2016-11-18 NOTE — Addendum Note (Signed)
Addended by: Peggyann Shoals on: 11/18/2016 07:50 AM   Modules accepted: Orders

## 2016-11-18 NOTE — Therapy (Signed)
[facility] [address]  [place], [place], [address] Phone: [phone]   Fax:  [phone]  Physical Therapy Treatment  Patient Details  Name: Brian [name] MRN: [id number] Date of Birth: 07/14/1945 Referring Provider: Mackie Pai, PA-C  Encounter Date: 11/18/2016      PT End of Session - 11/18/16 0817    Visit Number 7   Number of Visits 16   Date for PT Re-Evaluation 12/24/16   Authorization Type Medicare   PT Start Time 0810   PT Stop Time 0856   PT Time Calculation (min) 46 min   Activity Tolerance Patient tolerated treatment well   Behavior During Therapy [facility] for tasks assessed/performed      Past Medical History:  Diagnosis Date  . Atypical chest pain     Negative cardiac catheterization 2014  . 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 Blind right eye   . 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 Chicken pox   . Chronic back pain   . Dislocation, jaw 1960s  . Edema    Left Leg  . Essential hypertension, benign   . GERD (gastroesophageal reflux disease)   . [place] measles   . Heart attack ([place])   . Hyperlipidemia   . Measles   . Mumps   . Osteopenia 11.12.10   bone density test  . Personal history of other diseases of digestive system 08.10.12   Gastric ulcer    Past Surgical History:  Procedure Laterality Date  . ABDOMINAL HERNIA REPAIR  1972  . APPENDECTOMY  1973  . CATARACT EXTRACTION, BILATERAL  2005  . ESOPHAGOGASTRODUODENOSCOPY  06.2011  . EYE SURGERY  1972   Bilateral Lens Replacement  . FACIAL RECONSTRUCTION SURGERY  2008  . LEFT HEART CATH  02.17.2014  . [place], 2008   Dislocated Jaw  . PARS PLANA VITRECTOMY W/ REPAIR OF MACULAR HOLE  2005   macular hole repair, right eye  . PARS PLANA VITRECTOMY W/ REPAIR OF MACULAR HOLE  2010   mechanical, left eye  . PROSTATE BIOPSY  [place] 2017   w/ Dr. [place] Crane  . WISDOM TOOTH EXTRACTION      There were  no vitals filed for this visit.      Subjective Assessment - 11/18/16 0815    Subjective Pt. noting he had muscular soreness in LE and R shoulder following last visit.     Patient Stated Goals reduce pain, wean from medication   Currently in Pain? Yes   Pain Score 6    Pain Location Shoulder   Pain Orientation Right   Pain Descriptors / Indicators Aching;Sore   Pain Type Acute pain   Pain Frequency Intermittent   Aggravating Factors  Reaching above head,    Multiple Pain Sites No                         [facility] Adult PT Treatment/Exercise - 11/18/16 0388      Neuro Re-ed    Neuro Re-ed Details  Alternating lunge onto BOSU ball (up) with cross-over reach to cone on bolster x 15 reps; supervision provided     Lumbar Exercises: Aerobic   Stationary Bike NuStep: lvl 5, 7 min      Shoulder Exercises: Supine   Horizontal ABduction Both;15 reps;Theraband   Theraband Level (Shoulder Horizontal ABduction) Level 3 (Green)   Horizontal ABduction Limitations Laying on 1/2 foam bolster  External Rotation Both;15 reps;Theraband   Theraband Level (Shoulder External Rotation) Level 3 (Green)  laying on 1/2 foam bolster      Shoulder Exercises: Standing   External Rotation Strengthening;Right;15 reps;Theraband   Theraband Level (Shoulder External Rotation) Level 3 (Green)   External Rotation Limitations towel roll   Internal Rotation Strengthening;Right;15 reps;Theraband   Theraband Level (Shoulder Internal Rotation) Level 3 (Green)   Internal Rotation Limitations towel roll    Flexion 15 reps;Weights;Both;Strengthening   Shoulder Flexion Weight (lbs) 1   Flexion Limitations leaning on 6" bolster    ABduction 15 reps;Weights;Both;Strengthening   Shoulder ABduction Weight (lbs) 1    ABduction Limitations scaption; leaning on 6" bolster                 PT Education - 11/18/16 0858    Education provided Yes   Education Details Counter squat, clam shell with  green TB issued to pt., bridge with hip abd/ER with green TB    Person(s) Educated Patient   Methods Explanation;Demonstration;Verbal cues;Handout   Comprehension Verbalized understanding;Returned demonstration;Verbal cues required;Need further instruction          PT Short Term Goals - 11/04/16 0855      PT SHORT TERM GOAL #1   Title Patient to improve R shoulder AROM to WNL without pain (11/26/16)   Status On-going     PT SHORT TERM GOAL #2   Title Patient to improve Berg Balance to 51/56 (11/26/16)   Status On-going           PT Long Term Goals - 11/04/16 0856      PT LONG TERM GOAL #1   Title Patient to be independent with advacned HEP (12/24/16)   Status On-going     PT LONG TERM GOAL #2   Title Patient to improve R UE gross strength to 4+/5 without pain (12/24/16)   Status On-going     PT LONG TERM GOAL #3   Title Patient to demonstrate ability to ambulate over various surfaces for > 500 feet without LOB or fall demonstrating improved balance and functional mobility (12/24/16)   Status On-going     PT LONG TERM GOAL #4   Title Patient to demonstrate ability to reach overhead without pain (12/24/16)   Status On-going     PT LONG TERM GOAL #5   Title Patient to report reduction in LBP by 50% (12/24/16)   Status On-going               Plan - 11/18/16 1540    Clinical Impression Statement Pt. doing well today noting some muscular soreness folllowing last treatment which subsided next day.  Some balance and hip strengthening activities today which were well tolerated.  Pt. able to perform all activities well without pain thus HEP updated to include more hip strengthening activities.  Pt. noting he is consistently performing balance and shoulder HEP without issue at home consistently.  Pt. seems to be progressing well with strengthening and balance activities at this point.     PT Treatment/Interventions ADLs/Self Care Home Management;Cryotherapy;Electrical  Stimulation;Iontophoresis 4mg /ml Dexamethasone;Moist Heat;Traction;Ultrasound;Neuromuscular re-education;Balance training;Therapeutic exercise;Therapeutic activities;Functional mobility training;Stair training;Patient/family education;Manual techniques;Passive range of motion;Vasopneumatic Device;Taping;Dry needling   PT Next Visit Plan Monitor tolerance to updated HEP; Continue balance, core, and R shoulder mobility/strength      Patient will benefit from skilled therapeutic intervention in order to improve the following deficits and impairments:  Decreased activity tolerance, Decreased balance, Decreased range of motion, Decreased mobility, Decreased strength,  Impaired UE functional use, Pain  Visit Diagnosis: Other abnormalities of gait and mobility  Acute pain of right shoulder  Chronic low back pain, unspecified back pain laterality, with sciatica presence unspecified  Unsteadiness on feet     Problem List Patient Active Problem List   Diagnosis Date Noted  . Left shoulder pain 05/19/2016  . Urinary hesitancy 10/18/2014  . Trapezius muscle spasm 10/18/2014  . Idiopathic neuropathy 10/18/2014  . Allergic reaction 09/28/2014  . Hyperglycemia 09/28/2014  . Acute bacterial bronchitis 08/18/2014  . Hematuria 08/18/2014  . Screening, ischemic heart disease 06/16/2014  . Lumbar radiculopathy, chronic 05/19/2014  . Midline thoracic back pain 03/24/2014  . BPPV (benign paroxysmal positional vertigo) 01/20/2014  . Right-sided low back pain with right-sided sciatica 12/21/2013  . Lightheadedness 09/22/2013  . Porokeratosis 02/25/2013  . Pain in joint, ankle and foot 02/25/2013  . Lumbosacral pain 02/15/2013  . Abdominal pain, epigastric 02/15/2013  . Acid reflux 02/15/2013  . Hearing loss 02/15/2013  . Preventive measure 02/15/2013  . Chest pain 07/19/2012  . Bradycardia, sinus 07/19/2012  . Borderline glaucoma 03/15/2012  . 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 Dislocated inferior maxilla 01/10/2011  . H/O gastric ulcer 01/10/2011  . Back pain, chronic 11/21/2010    Bess Harvest, PTA 11/18/16 11:53 AM  [facility] [address]  [place], [place], [address] Phone: [phone]   Fax:  [phone]  Name: Brian [name] MRN: [id number] Date of Birth: Oct 14, 1945

## 2016-11-20 LAB — URINE CYTOLOGY ANCILLARY ONLY
CHLAMYDIA, DNA PROBE: NEGATIVE
Neisseria Gonorrhea: NEGATIVE
TRICH (WINDOWPATH): NEGATIVE

## 2016-11-21 ENCOUNTER — Ambulatory Visit: Payer: MEDICARE | Admitting: Physical Therapy

## 2016-11-21 DIAGNOSIS — R2689 Other abnormalities of gait and mobility: Secondary | ICD-10-CM | POA: Diagnosis not present

## 2016-11-21 DIAGNOSIS — M25511 Pain in right shoulder: Secondary | ICD-10-CM

## 2016-11-21 DIAGNOSIS — G8929 Other chronic pain: Secondary | ICD-10-CM

## 2016-11-21 DIAGNOSIS — R2681 Unsteadiness on feet: Secondary | ICD-10-CM

## 2016-11-21 DIAGNOSIS — M545 Low back pain: Secondary | ICD-10-CM

## 2016-11-21 NOTE — Therapy (Signed)
[facility] [address]  [place], [place], [address] Phone: [phone]   Fax:  [phone]  Physical Therapy Treatment  Patient Details  Name: Brian [name] MRN: [id number] Date of Birth: 05-Feb-1946 Referring Provider: Mackie Pai, PA-C  Encounter Date: 11/21/2016      PT End of Session - 11/21/16 0845    Visit Number 8   Number of Visits 16   Date for PT Re-Evaluation 12/24/16   Authorization Type Medicare   PT Start Time 0842   PT Stop Time 0922   PT Time Calculation (min) 40 min   Activity Tolerance Patient tolerated treatment well   Behavior During Therapy [facility] for tasks assessed/performed      Past Medical History:  Diagnosis Date  . Atypical chest pain     Negative cardiac catheterization 2014  . 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 Blind right eye   . 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 Chicken pox   . Chronic back pain   . Dislocation, jaw 1960s  . Edema    Left Leg  . Essential hypertension, benign   . GERD (gastroesophageal reflux disease)   . [place] measles   . Heart attack ([place])   . Hyperlipidemia   . Measles   . Mumps   . Osteopenia 11.12.10   bone density test  . Personal history of other diseases of digestive system 08.10.12   Gastric ulcer    Past Surgical History:  Procedure Laterality Date  . ABDOMINAL HERNIA REPAIR  1972  . APPENDECTOMY  1973  . CATARACT EXTRACTION, BILATERAL  2005  . ESOPHAGOGASTRODUODENOSCOPY  06.2011  . EYE SURGERY  1972   Bilateral Lens Replacement  . FACIAL RECONSTRUCTION SURGERY  2008  . LEFT HEART CATH  02.17.2014  . [place], 2008   Dislocated Jaw  . PARS PLANA VITRECTOMY W/ REPAIR OF MACULAR HOLE  2005   macular hole repair, right eye  . PARS PLANA VITRECTOMY W/ REPAIR OF MACULAR HOLE  2010   mechanical, left eye  . PROSTATE BIOPSY  [place] 2017   w/ Dr. [place] Crane  . WISDOM TOOTH EXTRACTION      There were  no vitals filed for this visit.      Subjective Assessment - 11/21/16 0844    Subjective "I can lift my arm up, but I'm having bicep soreness"   Patient Stated Goals reduce pain, wean from medication   Currently in Pain? Yes   Pain Score 4    Pain Location Arm   Pain Orientation Proximal;Mid;Right   Pain Descriptors / Indicators Aching;Sore   Pain Type Acute pain                         [facility] Adult PT Treatment/Exercise - 11/21/16 0001      Shoulder Exercises: Supine   External Rotation Both;15 reps;Theraband   Theraband Level (Shoulder External Rotation) Level 2 (Red)   External Rotation Limitations hooklying on pool noodle   Other Supine Exercises resisted diagonlas - hooklying on pool noodle - red tband     Shoulder Exercises: Standing   Flexion Strengthening;Both;15 reps;Weights   Shoulder Flexion Weight (lbs) 2   Flexion Limitations pool noodle against wall   ABduction Strengthening;Both;15 reps;Limitations   Shoulder ABduction Weight (lbs) 2   ABduction Limitations pool noodle against wall   Row Both;15 reps;Theraband   Theraband Level (Shoulder Row) Level 3 (Green)  Row Limitations VC for correct form   Other Standing Exercises wall push-ups x 15 reps   Other Standing Exercises 4-way resisted D1/D2 PNF pattern - red tband x 15 each direction     Shoulder Exercises: ROM/Strengthening   UBE (Upper Arm Bike) level 3.5 x 6 minutes (3/3)                  PT Short Term Goals - 11/04/16 0240      PT SHORT TERM GOAL #1   Title Patient to improve R shoulder AROM to WNL without pain (11/26/16)   Status On-going     PT SHORT TERM GOAL #2   Title Patient to improve Berg Balance to 51/56 (11/26/16)   Status On-going           PT Long Term Goals - 11/04/16 0856      PT LONG TERM GOAL #1   Title Patient to be independent with advacned HEP (12/24/16)   Status On-going     PT LONG TERM GOAL #2   Title Patient to improve R UE gross strength  to 4+/5 without pain (12/24/16)   Status On-going     PT LONG TERM GOAL #3   Title Patient to demonstrate ability to ambulate over various surfaces for > 500 feet without LOB or fall demonstrating improved balance and functional mobility (12/24/16)   Status On-going     PT LONG TERM GOAL #4   Title Patient to demonstrate ability to reach overhead without pain (12/24/16)   Status On-going     PT LONG TERM GOAL #5   Title Patient to report reduction in LBP by 50% (12/24/16)   Status On-going               Plan - 11/21/16 0845    Clinical Impression Statement Patient doing well with all strengthening work today - some fatigue noted after weighted overhead movements requiring rest breaks. Doing well with all progressions towwards goals.    PT Treatment/Interventions ADLs/Self Care Home Management;Cryotherapy;Electrical Stimulation;Iontophoresis 4mg /ml Dexamethasone;Moist Heat;Traction;Ultrasound;Neuromuscular re-education;Balance training;Therapeutic exercise;Therapeutic activities;Functional mobility training;Stair training;Patient/family education;Manual techniques;Passive range of motion;Vasopneumatic Device;Taping;Dry needling   Consulted and Agree with Plan of Care Patient      Patient will benefit from skilled therapeutic intervention in order to improve the following deficits and impairments:  Decreased activity tolerance, Decreased balance, Decreased range of motion, Decreased mobility, Decreased strength, Impaired UE functional use, Pain  Visit Diagnosis: Other abnormalities of gait and mobility  Acute pain of right shoulder  Chronic low back pain, unspecified back pain laterality, with sciatica presence unspecified  Unsteadiness on feet     Problem List Patient Active Problem List   Diagnosis Date Noted  . Left shoulder pain 05/19/2016  . Urinary hesitancy 10/18/2014  . Trapezius muscle spasm 10/18/2014  . Idiopathic neuropathy 10/18/2014  . Allergic reaction  09/28/2014  . Hyperglycemia 09/28/2014  . Acute bacterial bronchitis 08/18/2014  . Hematuria 08/18/2014  . Screening, ischemic heart disease 06/16/2014  . Lumbar radiculopathy, chronic 05/19/2014  . Midline thoracic back pain 03/24/2014  . BPPV (benign paroxysmal positional vertigo) 01/20/2014  . Right-sided low back pain with right-sided sciatica 12/21/2013  . Lightheadedness 09/22/2013  . Porokeratosis 02/25/2013  . Pain in joint, ankle and foot 02/25/2013  . Lumbosacral pain 02/15/2013  . Abdominal pain, epigastric 02/15/2013  . Acid reflux 02/15/2013  . Hearing loss 02/15/2013  . Preventive measure 02/15/2013  . Chest pain 07/19/2012  . Bradycardia, sinus 07/19/2012  . Borderline  glaucoma 03/15/2012  . 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 Dislocated inferior maxilla 01/10/2011  . H/O gastric ulcer 01/10/2011  . Back pain, chronic 11/21/2010     Lanney Gins, PT, DPT 11/21/16 9:22 AM   [facility] [address]  [place], [place], [address] Phone: [phone]   Fax:  [phone]  Name: EOGHAN BELCHER MRN: [id number] Date of Birth: 07/01/1945

## 2016-11-25 ENCOUNTER — Ambulatory Visit: Payer: MEDICARE

## 2016-11-25 DIAGNOSIS — M25511 Pain in right shoulder: Secondary | ICD-10-CM

## 2016-11-25 DIAGNOSIS — R2689 Other abnormalities of gait and mobility: Secondary | ICD-10-CM | POA: Diagnosis not present

## 2016-11-25 DIAGNOSIS — G8929 Other chronic pain: Secondary | ICD-10-CM

## 2016-11-25 DIAGNOSIS — R2681 Unsteadiness on feet: Secondary | ICD-10-CM

## 2016-11-25 DIAGNOSIS — M545 Low back pain: Secondary | ICD-10-CM

## 2016-11-25 NOTE — Therapy (Signed)
[facility] [address]  [place], [place], [address] Phone: [phone]   Fax:  [phone]  Physical Therapy Treatment  Patient Details  Name: Brian [name] MRN: [id number] Date of Birth: [birth year] Referring Provider: Mackie Pai, PA-C  Encounter Date: 11/25/2016      PT End of Session - 11/25/16 0812    Visit Number 9   Number of Visits 16   Date for PT Re-Evaluation 12/24/16   Authorization Type Medicare   PT Start Time 0803   PT Stop Time 0859   PT Time Calculation (min) 56 min   Activity Tolerance Patient tolerated treatment well   Behavior During Therapy [facility] for tasks assessed/performed      Past Medical History:  Diagnosis Date  . Atypical chest pain     Negative cardiac catheterization 2014  . 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 Blind right eye   . 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 Chicken pox   . Chronic back pain   . Dislocation, jaw 1960s  . Edema    Left Leg  . Essential hypertension, benign   . GERD (gastroesophageal reflux disease)   . [place] measles   . Heart attack ([place])   . Hyperlipidemia   . Measles   . Mumps   . Osteopenia 11.12.10   bone density test  . Personal history of other diseases of digestive system 08.10.12   Gastric ulcer    Past Surgical History:  Procedure Laterality Date  . ABDOMINAL HERNIA REPAIR  1972  . APPENDECTOMY  1973  . CATARACT EXTRACTION, BILATERAL  2005  . ESOPHAGOGASTRODUODENOSCOPY  06.2011  . EYE SURGERY  1972   Bilateral Lens Replacement  . FACIAL RECONSTRUCTION SURGERY  2008  . LEFT HEART CATH  02.17.2014  . [place], 2008   Dislocated Jaw  . PARS PLANA VITRECTOMY W/ REPAIR OF MACULAR HOLE  2005   macular hole repair, right eye  . PARS PLANA VITRECTOMY W/ REPAIR OF MACULAR HOLE  2010   mechanical, left eye  . PROSTATE BIOPSY  [place] 2017   w/ Dr. Nevada Crane  . WISDOM TOOTH EXTRACTION      There were  no vitals filed for this visit.          [facility] PT Assessment - 11/25/16 0814      AROM   AROM Assessment Site Shoulder   Right/Left Shoulder Right;Left   Right Shoulder Flexion 135 Degrees   Right Shoulder ABduction 137 Degrees   Right Shoulder Internal Rotation 72 Degrees   Right Shoulder External Rotation 89 Degrees   Left Shoulder Flexion 142 Degrees   Left Shoulder ABduction 122 Degrees   Left Shoulder Internal Rotation 82 Degrees   Left Shoulder External Rotation 80 Degrees     Berg Balance Test   Sit to Stand Able to stand without using hands and stabilize independently   Standing Unsupported Able to stand safely 2 minutes   Sitting with Back Unsupported but Feet Supported on Floor or Stool Able to sit safely and securely 2 minutes   Stand to Sit Sits safely with minimal use of hands   Transfers Able to transfer safely, minor use of hands   Standing Unsupported with Eyes Closed Able to stand 10 seconds safely   Standing Ubsupported with Feet Together Able to place feet together independently and stand 1 minute safely   From Standing, Reach Forward with Outstretched Arm Can reach forward >  12 cm safely (5")   From Standing Position, Pick up Object from [place] to pick up shoe safely and easily   From Standing Position, Turn to Look Behind Over each Shoulder Looks behind from both sides and weight shifts well   Turn 360 Degrees Able to turn 360 degrees safely in 4 seconds or less   Standing Unsupported, Alternately Place Feet on Step/Stool Able to stand independently and safely and complete 8 steps in 20 seconds   Standing Unsupported, One Foot in [place] to place foot tandem independently and hold 30 seconds   Standing on One Leg Able to lift leg independently and hold 5-10 seconds   Total Score 54                     [facility] Adult PT Treatment/Exercise - 11/25/16 0829      Self-Care   Self-Care Other Self-Care Comments   Other Self-Care Comments   Discussion of appropriateness of current HEP; pt. encouraged to concentrate on shoulder activities and cut back to every other day on balance activities with HEP; Discussion of BERG score and area of balance improvement     Shoulder Exercises: Standing   External Rotation Strengthening;Right;Theraband;20 reps   Theraband Level (Shoulder External Rotation) Level 3 (Green)   External Rotation Limitations towel roll   Internal Rotation Strengthening;Right;Theraband;20 reps   Theraband Level (Shoulder Internal Rotation) Level 3 (Green)   Flexion Strengthening;Both;15 reps;Weights   Shoulder Flexion Weight (lbs) 2   Flexion Limitations leaning on 1/2 foam on wall    ABduction Strengthening;Both;15 reps;Limitations   Shoulder ABduction Weight (lbs) 2   ABduction Limitations leaning on 1/2 foam on wall    Extension Strengthening;Both;15 reps;Theraband   Theraband Level (Shoulder Extension) Level 3 (Green)   Extension Limitations with scap squeeze   Row Both;15 reps;Theraband   Theraband Level (Shoulder Row) Level 4 (Blue)   Row Limitations some tc for full scap. squeeze    Other Standing Exercises B shoulder ER at 90 dg abd with red TB x 10 reps      Shoulder Exercises: ROM/Strengthening   UBE (Upper Arm Bike) level 3.5 x 6 minutes (3/3)                PT Education - 11/25/16 1884    Education provided Yes   Education Details row with green TB issued to pt., extension with green TB    Person(s) Educated Patient   Methods Explanation;Demonstration;Verbal cues;Handout   Comprehension Verbalized understanding;Returned demonstration;Verbal cues required;Need further instruction          PT Short Term Goals - 11/25/16 1660      PT SHORT TERM GOAL #1   Title Patient to improve R shoulder AROM to WNL without pain (11/26/16)   Status Partially Met  6.26.18: R shoulder AROM flex, abd, IR still ~ 5-10 dg less than L shoulder      PT SHORT TERM GOAL #2   Title Patient to improve  Berg Balance to 51/56 (11/26/16)   Status Achieved  6.26.18: BERG Balance 54/56           PT Long Term Goals - 11/04/16 0856      PT LONG TERM GOAL #1   Title Patient to be independent with advacned HEP (12/24/16)   Status On-going     PT LONG TERM GOAL #2   Title Patient to improve R UE gross strength to 4+/5 without pain (12/24/16)   Status On-going  PT LONG TERM GOAL #3   Title Patient to demonstrate ability to ambulate over various surfaces for > 500 feet without LOB or fall demonstrating improved balance and functional mobility (12/24/16)   Status On-going     PT LONG TERM GOAL #4   Title Patient to demonstrate ability to reach overhead without pain (12/24/16)   Status On-going     PT LONG TERM GOAL #5   Title Patient to report reduction in LBP by 50% (12/24/16)   Status On-going               Plan - 11/25/16 0908    Clinical Impression Statement Pt. making good progress with therapy thus far with improvement in R shoulder AROM and BERG balance scoring.  BERG balance score improved from 46/56 when tested on 5.30 to 54/56 score today showing decrease in fall risk.  R shoulder AROM nearly symmetrical to L with flexion, abduction, and IR still lacking 5-10 dg as compared to L.  Pt. to see MD tomorrow (6.27) for f/u and scheduled for 10th visit with therapy 6.29.  HEP reviewed with pt. and updated to shift focus toward shoulder strengthening to improve AROM and overhead functional ability. Progressing well at this point.     PT Treatment/Interventions ADLs/Self Care Home Management;Cryotherapy;Electrical Stimulation;Iontophoresis 94m/ml Dexamethasone;Moist Heat;Traction;Ultrasound;Neuromuscular re-education;Balance training;Therapeutic exercise;Therapeutic activities;Functional mobility training;Stair training;Patient/family education;Manual techniques;Passive range of motion;Vasopneumatic Device;Taping;Dry needling   PT Next Visit Plan 10th visit G-code/FOTO; R shoulder  mobility/strength; tolerance to updated HEP       Patient will benefit from skilled therapeutic intervention in order to improve the following deficits and impairments:  Decreased activity tolerance, Decreased balance, Decreased range of motion, Decreased mobility, Decreased strength, Impaired UE functional use, Pain  Visit Diagnosis: Other abnormalities of gait and mobility  Acute pain of right shoulder  Chronic low back pain, unspecified back pain laterality, with sciatica presence unspecified  Unsteadiness on feet     Problem List Patient Active Problem List   Diagnosis Date Noted  . Left shoulder pain 05/19/2016  . Urinary hesitancy 10/18/2014  . Trapezius muscle spasm 10/18/2014  . Idiopathic neuropathy 10/18/2014  . Allergic reaction 09/28/2014  . Hyperglycemia 09/28/2014  . Acute bacterial bronchitis 08/18/2014  . Hematuria 08/18/2014  . Screening, ischemic heart disease 06/16/2014  . Lumbar radiculopathy, chronic 05/19/2014  . Midline thoracic back pain 03/24/2014  . BPPV (benign paroxysmal positional vertigo) 01/20/2014  . Right-sided low back pain with right-sided sciatica 12/21/2013  . Lightheadedness 09/22/2013  . Porokeratosis 02/25/2013  . Pain in joint, ankle and foot 02/25/2013  . Lumbosacral pain 02/15/2013  . Abdominal pain, epigastric 02/15/2013  . Acid reflux 02/15/2013  . Hearing loss 02/15/2013  . Preventive measure 02/15/2013  . Chest pain 07/19/2012  . Bradycardia, sinus 07/19/2012  . Borderline glaucoma 03/15/2012  . 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 Dislocated inferior maxilla 01/10/2011  . H/O gastric ulcer 01/10/2011  . Back pain, chronic 11/21/2010    MBess Harvest PTA 11/25/16 9:26 AM  [facility] [place] [place] [address]Phone: [phone]  Fax:  [phone] Name: Brian GRIMMMRN: [id number] of Birth:  6December 31, 1947

## 2016-11-26 ENCOUNTER — Encounter: Payer: Self-pay | Admitting: Medical

## 2016-11-26 ENCOUNTER — Telehealth: Payer: Self-pay | Admitting: Medical

## 2016-11-26 ENCOUNTER — Ambulatory Visit (INDEPENDENT_AMBULATORY_CARE_PROVIDER_SITE_OTHER): Payer: MEDICARE | Admitting: Medical

## 2016-11-26 VITALS — BP 114/71 | HR 60 | Temp 98.4°F | Resp 16 | Wt 183.4 lb

## 2016-11-26 DIAGNOSIS — K760 Fatty (change of) liver, not elsewhere classified: Secondary | ICD-10-CM

## 2016-11-26 DIAGNOSIS — L299 Pruritus, unspecified: Secondary | ICD-10-CM | POA: Diagnosis not present

## 2016-11-26 DIAGNOSIS — R109 Unspecified abdominal pain: Secondary | ICD-10-CM

## 2016-11-26 LAB — COMPREHENSIVE METABOLIC PANEL
ALBUMIN: 3.9 g/dL (ref 3.5–5.2)
ALT: 18 U/L (ref 0–53)
AST: 18 U/L (ref 0–37)
Alkaline Phosphatase: 62 U/L (ref 39–117)
BILIRUBIN TOTAL: 1.2 mg/dL (ref 0.2–1.2)
BUN: 11 mg/dL (ref 6–23)
CALCIUM: 9.8 mg/dL (ref 8.4–10.5)
CO2: 32 meq/L (ref 19–32)
CREATININE: 1.09 mg/dL (ref 0.40–1.50)
Chloride: 101 mEq/L (ref 96–112)
GFR: 85.28 mL/min (ref >60.00)
Glucose, Bld: 100 mg/dL — ABNORMAL HIGH (ref 70–99)
Potassium: 3.8 mEq/L (ref 3.5–5.1)
SODIUM: 137 meq/L (ref 135–145)
Total Protein: 6.7 g/dL (ref 6.0–8.3)

## 2016-11-26 LAB — AMMONIA: AMMONIA: 36 umol/L — AB (ref 11–35)

## 2016-11-26 MED ORDER — HYDROXYZINE HCL 10 MG PO TABS: ORAL_TABLET | ORAL | 0 refills | Status: DC

## 2016-11-26 MED FILL — hydrOXYzine HCL 10 MG TABS: 10 | 14 days supply | Qty: 14 | Fill #0

## 2016-11-26 NOTE — Progress Notes (Signed)
Subjective:    Patient ID: Brian [name], male    DOB: 23-May-1946, 71 y.o.   MRN: [id number]  HPI   Pt in with some mild itching to his arms for the past week. He states itches more on arms and on his back. On review no known insect bites. Overall no known suspicious allergy exposures on review(food, out doors, new creams, plants or poison ivy/oak etc .  Pt does not drink alcohol. He has some small  Slight scars on his arms. Also on review he denies taking excessive hot showers. Hx of diffuse fatty liver on ultrasound.  Pt states itching is about the same for one week. Sometimes worse in the evenings. But denies that it keeps in up.  Pt also reports rt upper quadrant trochar scar region itches with occasional sharp second or two pain. Occurs 3-4 times a day. No rash in rt upper quadrant. Pain does radiate to his rt flank/back.  Pt wants to loose weight. He was referred to [facility] office as upstairs closed. Service that brings [place] here [place] go [place]. PT recently gave him some exercises to do at home. Bands and some body weight exercises. Pt walks 1.5 miles daily.       Review of Systems  Constitutional: Negative for chills, fatigue and fever.  Respiratory: Negative for cough, choking, chest tightness and shortness of breath.   Cardiovascular: Negative for chest pain.  Gastrointestinal: Negative for abdominal pain and blood in stool.  Musculoskeletal: Negative for back pain.  Skin: Positive for rash.       See hpi. Faint rash.  Neurological: Negative for dizziness, light-headedness, numbness and headaches.  Hematological: Negative for adenopathy. Does not bruise/bleed easily.  Psychiatric/Behavioral: Negative for behavioral problems and confusion.    Past Medical History:  Diagnosis Date  . Atypical chest pain     Negative cardiac catheterization 2014  . 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 Blind right eye   . 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 Chicken pox   . Chronic back pain   . Dislocation, jaw 1960s  . Edema    Left Leg  . Essential hypertension, benign   . GERD (gastroesophageal reflux disease)   . [place] measles   . Heart attack ([place])   . Hyperlipidemia   . Measles   . Mumps   . Osteopenia 11.12.10   bone density test  . Personal history of other diseases of digestive system 08.10.12   Gastric ulcer     Social History   Social History  . Marital status: Single    Spouse name: N/A  . Number of children: N/A  . Years of education: N/A   Occupational History  . Not on file.   Social History Main Topics  . Smoking status: Former Smoker    Packs/day: 0.25    Years: 2.00    Types: Cigarettes    Quit date: 06/02/1966  . Smokeless tobacco: Never Used  . Alcohol use No  . Drug use: No  . Sexual activity: No   Other Topics Concern  . Not on file   Social History Narrative  . No narrative on file    Past Surgical History:  Procedure Laterality Date  . ABDOMINAL HERNIA REPAIR  1972  . APPENDECTOMY  1973  . CATARACT EXTRACTION, BILATERAL  2005  . ESOPHAGOGASTRODUODENOSCOPY  06.2011  . EYE SURGERY  1972   Bilateral Lens Replacement  . FACIAL RECONSTRUCTION SURGERY  2008  .  LEFT HEART CATH  02.17.2014  . [place], 2008   Dislocated Jaw  . PARS PLANA VITRECTOMY W/ REPAIR OF MACULAR HOLE  2005   macular hole repair, right eye  . PARS PLANA VITRECTOMY W/ REPAIR OF MACULAR HOLE  2010   mechanical, left eye  . PROSTATE BIOPSY  [place] 2017   w/ Dr. [place] Crane  . WISDOM TOOTH EXTRACTION      Family History  Problem Relation Age of Onset  . Alzheimer's disease Mother        Deceased  . Diabetes Maternal Grandmother   . Heart attack Maternal Grandmother   . Prostate cancer Maternal Uncle   . Diabetes Maternal Uncle   . Healthy Son   . Cataracts Maternal Aunt     Allergies  Allergen Reactions  . Gabapentin Other (See Comments)    Feel "weird"; Nausea; Weakness; Syncope    Current  Outpatient Prescriptions on File Prior to Visit  Medication Sig Dispense Refill  . albuterol (PROVENTIL HFA;VENTOLIN HFA) 108 (90 Base) MCG/ACT inhaler Inhale 2 puffs into the lungs every 6 (six) hours as needed for wheezing or shortness of breath. 1 Inhaler 0  . aspirin (ASPIR-81) 81 MG EC tablet Take 1 tablet by mouth daily.    Marland Kitchen b complex vitamins tablet Take 1 tablet by mouth daily.    . Calcium Carbonate-Vitamin D (CALCIUM-VITAMIN D3 PO) Take by mouth daily.    . fluticasone (FLOVENT HFA) 110 MCG/ACT inhaler Inhale 2 puffs into the lungs 2 (two) times daily at 10 AM and 5 PM. 1 Inhaler 5  . hydrochlorothiazide (MICROZIDE) 12.5 MG capsule Take 1 capsule (12.5 mg total) by mouth daily. 90 capsule 3  . levocetirizine (XYZAL) 5 MG tablet Take 1 tablet (5 mg total) by mouth every evening. 90 tablet 0  . mirabegron ER (MYRBETRIQ) 50 MG TB24 tablet Take 50 mg by mouth daily.    . pantoprazole (PROTONIX) 40 MG tablet Take 1 tablet (40 mg total) by mouth 2 (two) times daily. 180 tablet 3  . simvastatin (ZOCOR) 40 MG tablet Take 1 tablet (40 mg total) by mouth every evening. 90 tablet 3  . tamsulosin (FLOMAX) 0.4 MG CAPS capsule Take 2 capsules (0.8 mg total) by mouth daily. 90 capsule 1  . timolol (TIMOPTIC) 0.5 % ophthalmic solution Place 1 drop into the left eye daily. 10 mL 12  . traMADol (ULTRAM) 50 MG tablet Take 1 tablet (50 mg total) by mouth every 8 (eight) hours as needed. 12 tablet 0  . cyclobenzaprine (FLEXERIL) 5 MG tablet 1 tab po q hs (Patient not taking: Reported on 11/26/2016) 10 tablet 0  . ezetimibe (ZETIA) 10 MG tablet Take 1 tablet (10 mg total) by mouth daily. (Patient not taking: Reported on 11/26/2016) 30 tablet 2  . ranitidine (ZANTAC) 150 MG capsule Take 1 capsule (150 mg total) by mouth 2 (two) times daily. (Patient not taking: Reported on 11/26/2016) 60 capsule 0   No current facility-administered medications on file prior to visit.     BP 114/71 (BP Location: Right Arm,  Patient Position: Sitting, Cuff Size: Normal)   Pulse 60   Temp 98.4 F (36.9 C) (Oral)   Resp 16   Wt 183 lb 6.4 oz (83.2 kg)   SpO2 99%   BMI 29.60 kg/m       Objective:   Physical Exam  General Appearance- Not in acute distress.  HEENT Eyes- Scleraeral/Conjuntiva-bilat- Not Yellow. Mouth & Throat- Normal.  Chest  and Lung Exam Auscultation: Breath sounds:-Normal. Adventitious sounds:- No Adventitious sounds.  Cardiovascular Auscultation:Rythm - Regular. Heart Sounds -Normal heart sounds.  Abdomen Inspection:-Inspection Normal.  Palpation/Perucssion: Palpation and Percussion of the abdomen reveal- faint rt upper quadrant  Tender, No Rebound tenderness, No rigidity(Guarding) and No Palpable abdominal masses.  Liver:-Normal.  Spleen:- Normal.   Skin- 2-3 small hyperpigmented area on lateral bicep area both sides. Areas measure 3-4 in width. Not raised. Trochar scars appear normal. No dermatomal rash.         Assessment & Plan:  For your itching of skin I want you to use moisturizer once daily and will rx hydroxyzine to use at night. Cause of itching currently not known. Will get labs check lft due to liver diffuse fatty liver and get ammonia level.  Also will get abdomen US and ask to schedule you for Friday.  With mild transient sharp pain educate on shingles. No rash now but if you get any rash on rt flank area as discussed notify us.  Advise diet and exercise. Will try to refer you to Weight loss clinic in [facility] if can find one for your since transportation issue.   Follow date to be determined after lab review.

## 2016-11-26 NOTE — Patient Instructions (Addendum)
For your itching of skin I want you to use moisturizer once daily and will rx hydroxyzine to use at night. Cause of itching currently not known. Will get labs check lft due to liver diffuse fatty liver and get ammonia level.  Also will get abdomen US and ask to schedule you for Friday.  With mild transient sharp pain educate on shingles. No rash now but if you get any rash on rt flank area as discussed notify us.( Shingles vaccine in 2014.)  Advise diet and exercise. Will try to refer you to Weight loss clinic in [facility] if can find one for your since transportation issue.   Follow date to be determined after lab review.

## 2016-11-26 NOTE — Telephone Encounter (Signed)
Will you refer to weight loss clinic but in [place].

## 2016-11-28 ENCOUNTER — Ambulatory Visit (HOSPITAL_BASED_OUTPATIENT_CLINIC_OR_DEPARTMENT_OTHER)
Admission: RE | Admit: 2016-11-28 | Discharge: 2016-11-28 | Disposition: A | Discharge status: Home / Self Care | Payer: MEDICARE | Source: Ambulatory Visit | Attending: Medical | Admitting: Medical

## 2016-11-28 ENCOUNTER — Encounter (HOSPITAL_BASED_OUTPATIENT_CLINIC_OR_DEPARTMENT_OTHER): Payer: Self-pay

## 2016-11-28 ENCOUNTER — Ambulatory Visit: Payer: MEDICARE

## 2016-11-28 DIAGNOSIS — R2681 Unsteadiness on feet: Secondary | ICD-10-CM

## 2016-11-28 DIAGNOSIS — M25511 Pain in right shoulder: Secondary | ICD-10-CM

## 2016-11-28 DIAGNOSIS — K760 Fatty (change of) liver, not elsewhere classified: Secondary | ICD-10-CM | POA: Diagnosis not present

## 2016-11-28 DIAGNOSIS — N2889 Other specified disorders of kidney and ureter: Secondary | ICD-10-CM | POA: Diagnosis not present

## 2016-11-28 DIAGNOSIS — M545 Low back pain: Secondary | ICD-10-CM

## 2016-11-28 DIAGNOSIS — Z9049 Acquired absence of other specified parts of digestive tract: Secondary | ICD-10-CM | POA: Insufficient documentation

## 2016-11-28 DIAGNOSIS — R109 Unspecified abdominal pain: Secondary | ICD-10-CM

## 2016-11-28 DIAGNOSIS — G8929 Other chronic pain: Secondary | ICD-10-CM

## 2016-11-28 DIAGNOSIS — R2689 Other abnormalities of gait and mobility: Secondary | ICD-10-CM | POA: Diagnosis not present

## 2016-11-28 NOTE — Therapy (Signed)
[facility] [address]  [place], [place], [address] Phone: [phone]   Fax:  [phone]  Physical Therapy Treatment  Patient Details  Name: Brian [name] MRN: [id number] Date of Birth: December 08, 1945 Referring Provider: Mackie Pai, PA-C  Encounter Date: 11/28/2016      PT End of Session - 11/28/16 0809    Visit Number 10   Number of Visits 16   Date for PT Re-Evaluation 12/24/16   Authorization Type Medicare   PT Start Time 0812   PT Stop Time 0905   PT Time Calculation (min) 53 min   Activity Tolerance Patient tolerated treatment well   Behavior During Therapy [facility] for tasks assessed/performed      Past Medical History:  Diagnosis Date  . Atypical chest pain     Negative cardiac catheterization 2014  . 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 Blind right eye   . 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 Chicken pox   . Chronic back pain   . Dislocation, jaw 1960s  . Edema    Left Leg  . Essential hypertension, benign   . GERD (gastroesophageal reflux disease)   . [place] measles   . Heart attack ([place])   . Hyperlipidemia   . Measles   . Mumps   . Osteopenia 11.12.10   bone density test  . Personal history of other diseases of digestive system 08.10.12   Gastric ulcer    Past Surgical History:  Procedure Laterality Date  . ABDOMINAL HERNIA REPAIR  1972  . APPENDECTOMY  1973  . CATARACT EXTRACTION, BILATERAL  2005  . CHOLECYSTECTOMY  08/2016  . ESOPHAGOGASTRODUODENOSCOPY  06.2011  . EYE SURGERY  1972   Bilateral Lens Replacement  . FACIAL RECONSTRUCTION SURGERY  2008  . LEFT HEART CATH  02.17.2014  . [place], 2008   Dislocated Jaw  . PARS PLANA VITRECTOMY W/ REPAIR OF MACULAR HOLE  2005   macular hole repair, right eye  . PARS PLANA VITRECTOMY W/ REPAIR OF MACULAR HOLE  2010   mechanical, left eye  . PROSTATE BIOPSY  [place] 2017   w/ Dr. Nevada Crane  . WISDOM TOOTH  EXTRACTION      There were no vitals filed for this visit.      Subjective Assessment - 11/28/16 0808    Subjective Pt. doing well noting he would like to be stronger reaching overhead and would like to lose weight around his trunk.     Patient Stated Goals reduce pain, wean from medication   Currently in Pain? No/denies   Pain Score 0-No pain   Multiple Pain Sites No            [facility] PT Assessment - 11/28/16 0828      Strength   Overall Strength Comments minor R shoulder pain with max effort in all resisted positions   Strength Assessment Site Shoulder   Right/Left Shoulder Right   Right Shoulder Flexion 4/5   Right Shoulder ABduction 4/5   Right Shoulder Internal Rotation 4/5   Right Shoulder External Rotation 4-/5                     [facility] Adult PT Treatment/Exercise - 11/28/16 8416      Ambulation/Gait   Ambulation/Gait Yes   Ambulation/Gait Assistance 7: Independent   Ambulation Distance (Feet) 800 Feet   Assistive device None   Ambulation Surface Level;Unlevel;Indoor;Outdoor;Paved;Grass   Stairs Yes  Stairs Assistance 7: Independent   Stair Management Technique Alternating pattern;No rails   Number of Stairs 20   Height of Stairs 8   Gait Comments Pt. able to ambulate on grass with forward gaze and good stability; Step over step ascending/descending stairs without rail use and good stability     Self-Care   Self-Care Other Self-Care Comments   Other Self-Care Comments  Discussion of community resources (silver sneakers) for general fitness; Discussion of goal status.       Lumbar Exercises: Aerobic   Stationary Bike NuStep: lvl 5, 5 min      Lumbar Exercises: Supine   Isometric Hip Flexion 10 reps;5 seconds  2 sets    Isometric Hip Flexion Limitations with UE pushing into knees    Other Supine Lumbar Exercises Dead bug x 2 reps; pt. verbalizing too difficult thus terminated      Shoulder Exercises: Standing   External Rotation Both;15  reps;Theraband   Theraband Level (Shoulder External Rotation) Level 2 (Red)   External Rotation Limitations elevated at 90 abduction    Flexion Strengthening;Both;20 reps  only minor R shoulder "discomfort"   Flexion Limitations leaning on 1/2 foam on wall    Extension Strengthening;Both;Theraband;20 reps  good retraction without cueing   Theraband Level (Shoulder Extension) Level 3 (Green)   Extension Limitations with scap squeeze   Row Both;15 reps;Theraband  good retraction without cueing    Theraband Level (Shoulder Row) Level 4 (Blue)   Row Limitations some tc for full scap. squeeze      Shoulder Exercises: ROM/Strengthening   Other ROM/Strengthening Exercises R shoulder flexion, scaption reaching to 2nd -3rd shelf with 2# x 1 min each way; pt. noting fatigue and mild R shoulder pain following which resolved                 PT Education - 11/28/16 0923    Education provided Yes   Education Details hip flexion isometrics, shoulder abduction, flexion, B shoulder ER with red band (pt. already has)   Person(s) Educated Patient   Methods Explanation;Demonstration;Verbal cues;Handout   Comprehension Verbalized understanding;Returned demonstration;Verbal cues required;Need further instruction          PT Short Term Goals - 11/25/16 9470      PT SHORT TERM GOAL #1   Title Patient to improve R shoulder AROM to WNL without pain (11/26/16)   Status Partially Met  6.26.18: R shoulder AROM flex, abd, IR still ~ 5-10 dg less than L shoulder      PT SHORT TERM GOAL #2   Title Patient to improve Berg Balance to 51/56 (11/26/16)   Status Achieved  6.26.18: BERG Balance 54/56           PT Long Term Goals - 11/28/16 0816      PT LONG TERM GOAL #1   Title Patient to be independent with advacned HEP (12/24/16)   Status Partially Met  6.29.18: met for current      PT LONG TERM GOAL #2   Title Patient to improve R UE gross strength to 4+/5 without pain (12/24/16)   Status  On-going  6.29.18: still grossly 4/5 strength      PT LONG TERM GOAL #3   Title Patient to demonstrate ability to ambulate over various surfaces for > 500 feet without LOB or fall demonstrating improved balance and functional mobility (12/24/16)   Status Achieved     PT LONG TERM GOAL #4   Title Patient to demonstrate ability to  reach overhead without pain (12/24/16)   Status Achieved     PT LONG TERM GOAL #5   Title Patient to report reduction in LBP by 50% (12/24/16)   Status Achieved  11-30-2016: Pt. noting 90% improvement in LBP.  "LBP not really bothering me now".  some LBP after standing still >10 min.                Plan - 11-30-16 0814    Clinical Impression Statement Pt. doing well today.  Notes he would like to lose weight around his trunk.  Discussion today regarding community resources to encourage regular exercise.  Pt. requesting core strengthening activities today thus HEP updated to include these with pt. able to perform without issue.  Pt. showing strength improvement at R shoulder today with testing grossly 4/5.  Pt. reports he is able to reach overhead at home without pain now.  Noting 90% improvement in LBP since starting therapy.  Able to ambulate outside on grass, navigate stairs reciprocally without rail use, and navigate various surfaces without LOB and with good stability.  BERG Balance testing improved from 46/56 (5.30.18) > 54/56 when tested on 6.26.18 showing significant decrease in fall risk.  Pt. continues to demo good recall of current HEP and demonstrates good progress with therapy to this point.  Showed interest in utilizing community resources.  Will further discuss options in upcoming visits.     PT Treatment/Interventions ADLs/Self Care Home Management;Cryotherapy;Electrical Stimulation;Iontophoresis 19m/ml Dexamethasone;Moist Heat;Traction;Ultrasound;Neuromuscular re-education;Balance training;Therapeutic exercise;Therapeutic activities;Functional mobility  training;Stair training;Patient/family education;Manual techniques;Passive range of motion;Vasopneumatic Device;Taping;Dry needling   PT Next Visit Plan Further discuss community resources for general fitness; R shoulder mobility/strength; tolerance to updated HEP       Patient will benefit from skilled therapeutic intervention in order to improve the following deficits and impairments:  Decreased activity tolerance, Decreased balance, Decreased range of motion, Decreased mobility, Decreased strength, Impaired UE functional use, Pain  Visit Diagnosis: Other abnormalities of gait and mobility  Acute pain of right shoulder  Chronic low back pain, unspecified back pain laterality, with sciatica presence unspecified  Unsteadiness on feet       G-Codes - 02018/07/010908    Functional Assessment Tool Used (Outpatient Only) FOTO: 60% (40% limitation)   Functional Limitation Mobility: Walking and moving around   Mobility: Walking and Moving Around Current Status [phone] At least 40 percent but less than 60 percent impaired, limited or restricted   Mobility: Walking and Moving Around Goal Status [phone] At least 20 percent but less than 40 percent impaired, limited or restricted      Problem List Patient Active Problem List   Diagnosis Date Noted  . Left shoulder pain 05/19/2016  . Urinary hesitancy 10/18/2014  . Trapezius muscle spasm 10/18/2014  . Idiopathic neuropathy 10/18/2014  . Allergic reaction 09/28/2014  . Hyperglycemia 09/28/2014  . Acute bacterial bronchitis 08/18/2014  . Hematuria 08/18/2014  . Screening, ischemic heart disease 06/16/2014  . Lumbar radiculopathy, chronic 05/19/2014  . Midline thoracic back pain 03/24/2014  . BPPV (benign paroxysmal positional vertigo) 01/20/2014  . Right-sided low back pain with right-sided sciatica 12/21/2013  . Lightheadedness 09/22/2013  . Porokeratosis 02/25/2013  . Pain in joint, ankle and foot 02/25/2013  . Lumbosacral pain  02/15/2013  . Abdominal pain, epigastric 02/15/2013  . Acid reflux 02/15/2013  . Hearing loss 02/15/2013  . Preventive measure 02/15/2013  . Chest pain 07/19/2012  . Bradycardia, sinus 07/19/2012  . Borderline glaucoma 03/15/2012  . 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 Dislocated inferior maxilla 01/10/2011  . H/O gastric ulcer 01/10/2011  . Back pain, chronic 11/21/2010    Bess Harvest, PTA 11/28/16 11:48 AM   [facility] [address]  [place], [place], [address] Phone: [phone]   Fax:  [phone]  Name: Brian [name] MRN: [id number] Date of Birth: Apr 11, 1946

## 2016-12-05 ENCOUNTER — Ambulatory Visit: Payer: MEDICARE | Attending: Medical | Admitting: Physical Therapy

## 2016-12-05 DIAGNOSIS — G8929 Other chronic pain: Secondary | ICD-10-CM | POA: Diagnosis present

## 2016-12-05 DIAGNOSIS — R2681 Unsteadiness on feet: Secondary | ICD-10-CM | POA: Insufficient documentation

## 2016-12-05 DIAGNOSIS — M545 Low back pain: Secondary | ICD-10-CM | POA: Insufficient documentation

## 2016-12-05 DIAGNOSIS — R2689 Other abnormalities of gait and mobility: Secondary | ICD-10-CM | POA: Diagnosis present

## 2016-12-05 DIAGNOSIS — M25511 Pain in right shoulder: Secondary | ICD-10-CM | POA: Insufficient documentation

## 2016-12-05 NOTE — Therapy (Signed)
[facility] [address]  [place], [place], [address] Phone: [phone]   Fax:  [phone]  Physical Therapy Treatment  Patient Details  Name: Brian [name] MRN: [id number] Date of Birth: December 31, 1945 Referring Provider: Mackie Pai, PA-C  Encounter Date: 12/05/2016      PT End of Session - 12/05/16 0748    Visit Number 11   Number of Visits 16   Date for PT Re-Evaluation 12/24/16   Authorization Type Medicare   PT Start Time 0745   PT Stop Time 0827   PT Time Calculation (min) 42 min   Activity Tolerance Patient tolerated treatment well   Behavior During Therapy [facility] for tasks assessed/performed      Past Medical History:  Diagnosis Date  . Atypical chest pain     Negative cardiac catheterization 2014  . 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 Blind right eye   . 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 Chicken pox   . Chronic back pain   . Dislocation, jaw 1960s  . Edema    Left Leg  . Essential hypertension, benign   . GERD (gastroesophageal reflux disease)   . [place] measles   . Heart attack ([place])   . Hyperlipidemia   . Measles   . Mumps   . Osteopenia 11.12.10   bone density test  . Personal history of other diseases of digestive system 08.10.12   Gastric ulcer    Past Surgical History:  Procedure Laterality Date  . ABDOMINAL HERNIA REPAIR  1972  . APPENDECTOMY  1973  . CATARACT EXTRACTION, BILATERAL  2005  . CHOLECYSTECTOMY  08/2016  . ESOPHAGOGASTRODUODENOSCOPY  06.2011  . EYE SURGERY  1972   Bilateral Lens Replacement  . FACIAL RECONSTRUCTION SURGERY  2008  . LEFT HEART CATH  02.17.2014  . [place], 2008   Dislocated Jaw  . PARS PLANA VITRECTOMY W/ REPAIR OF MACULAR HOLE  2005   macular hole repair, right eye  . PARS PLANA VITRECTOMY W/ REPAIR OF MACULAR HOLE  2010   mechanical, left eye  . PROSTATE BIOPSY  [place] 2017   w/ Dr. [place] Crane  . WISDOM TOOTH  EXTRACTION      There were no vitals filed for this visit.      Subjective Assessment - 12/05/16 0747    Subjective "today is not a good day" - pain increase in back and shoulder since Tuesday - no known reason   Patient Stated Goals reduce pain, wean from medication   Currently in Pain? Yes   Pain Score 7    Pain Location Shoulder  & back   Pain Orientation Right   Pain Descriptors / Indicators Aching;Discomfort   Pain Type Acute pain                         [facility] Adult PT Treatment/Exercise - 12/05/16 0749      Shoulder Exercises: Standing   External Rotation Right;15 reps;Theraband   Theraband Level (Shoulder External Rotation) Level 3 (Green)   Internal Rotation Right;15 reps;Theraband   Theraband Level (Shoulder Internal Rotation) Level 3 (Green)   Extension Both;15 reps;Theraband   Theraband Level (Shoulder Extension) Level 3 (Green)   Row Both;15 reps;Theraband   Theraband Level (Shoulder Row) Level 3 (Green)   Other Standing Exercises B scap retraction - leaning against pool noodle x 15 reps     Shoulder Exercises: Therapy Diona Foley  Flexion 15 reps   Flexion Limitations B UE - 2# at wrists     Shoulder Exercises: ROM/Strengthening   UBE (Upper Arm Bike) L2.5 x 6 min (3/3)   Cybex Row 20 reps   Cybex Row Limitations 15#   Wall Pushups 15 reps   Other ROM/Strengthening Exercises BATCA pull down 15# x 15 reps     Manual Therapy   Manual Therapy Soft tissue mobilization   Manual therapy comments patient supine   Soft tissue mobilization STM to R sided neck and upper back musculature - patient tender to deep touch; PT led R UT stretch 2 x 1 minute                  PT Short Term Goals - 11/25/16 2353      PT SHORT TERM GOAL #1   Title Patient to improve R shoulder AROM to WNL without pain (11/26/16)   Status Partially Met  6.26.18: R shoulder AROM flex, abd, IR still ~ 5-10 dg less than L shoulder      PT SHORT TERM GOAL #2   Title  Patient to improve Berg Balance to 51/56 (11/26/16)   Status Achieved  6.26.18: BERG Balance 54/56           PT Long Term Goals - 11/28/16 0816      PT LONG TERM GOAL #1   Title Patient to be independent with advacned HEP (12/24/16)   Status Partially Met  6.29.18: met for current      PT LONG TERM GOAL #2   Title Patient to improve R UE gross strength to 4+/5 without pain (12/24/16)   Status On-going  6.29.18: still grossly 4/5 strength      PT LONG TERM GOAL #3   Title Patient to demonstrate ability to ambulate over various surfaces for > 500 feet without LOB or fall demonstrating improved balance and functional mobility (12/24/16)   Status Achieved     PT LONG TERM GOAL #4   Title Patient to demonstrate ability to reach overhead without pain (12/24/16)   Status Achieved     PT LONG TERM GOAL #5   Title Patient to report reduction in LBP by 50% (12/24/16)   Status Achieved  6.29.18: Pt. noting 90% improvement in LBP.  "LBP not really bothering me now".  some LBP after standing still >10 min.                Plan - 12/05/16 0748    Clinical Impression Statement Patient today reporting an increase in R shoulder/back pain today of unknown region - likely musculature in nature as patient is tender to deep palpation and responded well to stretching and manual therapy today. Gentle strengthening for R shoulder and periscapular musculature continued today with good tolerance. WIll continue to progress as tolerated towards transition to HEP.    PT Treatment/Interventions ADLs/Self Care Home Management;Cryotherapy;Electrical Stimulation;Iontophoresis 44m/ml Dexamethasone;Moist Heat;Traction;Ultrasound;Neuromuscular re-education;Balance training;Therapeutic exercise;Therapeutic activities;Functional mobility training;Stair training;Patient/family education;Manual techniques;Passive range of motion;Vasopneumatic Device;Taping;Dry needling   Consulted and Agree with Plan of Care Patient       Patient will benefit from skilled therapeutic intervention in order to improve the following deficits and impairments:  Decreased activity tolerance, Decreased balance, Decreased range of motion, Decreased mobility, Decreased strength, Impaired UE functional use, Pain  Visit Diagnosis: Other abnormalities of gait and mobility  Acute pain of right shoulder  Chronic low back pain, unspecified back pain laterality, with sciatica presence unspecified  Unsteadiness on feet  Problem List Patient Active Problem List   Diagnosis Date Noted  . Left shoulder pain 05/19/2016  . Urinary hesitancy 10/18/2014  . Trapezius muscle spasm 10/18/2014  . Idiopathic neuropathy 10/18/2014  . Allergic reaction 09/28/2014  . Hyperglycemia 09/28/2014  . Acute bacterial bronchitis 08/18/2014  . Hematuria 08/18/2014  . Screening, ischemic heart disease 06/16/2014  . Lumbar radiculopathy, chronic 05/19/2014  . Midline thoracic back pain 03/24/2014  . BPPV (benign paroxysmal positional vertigo) 01/20/2014  . Right-sided low back pain with right-sided sciatica 12/21/2013  . Lightheadedness 09/22/2013  . Porokeratosis 02/25/2013  . Pain in joint, ankle and foot 02/25/2013  . Lumbosacral pain 02/15/2013  . Abdominal pain, epigastric 02/15/2013  . Acid reflux 02/15/2013  . Hearing loss 02/15/2013  . Preventive measure 02/15/2013  . Chest pain 07/19/2012  . Bradycardia, sinus 07/19/2012  . Borderline glaucoma 03/15/2012  . 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 Dislocated inferior maxilla 01/10/2011  . H/O gastric ulcer 01/10/2011  . Back pain, chronic 11/21/2010    Lanney Gins, PT, DPT 12/05/16 8:28 AM   [facility] [address]  [place], [place], [address] Phone: [phone]   Fax:  [phone]  Name: Brian [name] MRN: [id number] Date of Birth: 11-28-1945

## 2016-12-09 ENCOUNTER — Ambulatory Visit: Payer: MEDICARE | Admitting: Physical Therapy

## 2016-12-09 DIAGNOSIS — M25511 Pain in right shoulder: Secondary | ICD-10-CM

## 2016-12-09 DIAGNOSIS — M545 Low back pain: Secondary | ICD-10-CM

## 2016-12-09 DIAGNOSIS — R2689 Other abnormalities of gait and mobility: Secondary | ICD-10-CM

## 2016-12-09 DIAGNOSIS — R2681 Unsteadiness on feet: Secondary | ICD-10-CM

## 2016-12-09 DIAGNOSIS — G8929 Other chronic pain: Secondary | ICD-10-CM

## 2016-12-09 NOTE — Therapy (Signed)
[facility] [address]  [place], [place], [address] Phone: [phone]   Fax:  [phone]  Physical Therapy Treatment  Patient Details  Name: Brian [name] MRN: [id number] Date of Birth: 1945-12-27 Referring Provider: Mackie Pai, PA-C  Encounter Date: 12/09/2016      PT End of Session - 12/09/16 0756    Visit Number 12   Number of Visits 16   Date for PT Re-Evaluation 12/24/16   Authorization Type Medicare   PT Start Time 1027   PT Stop Time 0834   PT Time Calculation (min) 40 min   Activity Tolerance Patient tolerated treatment well   Behavior During Therapy [facility] for tasks assessed/performed      Past Medical History:  Diagnosis Date  . Atypical chest pain     Negative cardiac catheterization 2014  . 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 Blind right eye   . 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 Chicken pox   . Chronic back pain   . Dislocation, jaw 1960s  . Edema    Left Leg  . Essential hypertension, benign   . GERD (gastroesophageal reflux disease)   . [place] measles   . Heart attack ([place])   . Hyperlipidemia   . Measles   . Mumps   . Osteopenia 11.12.10   bone density test  . Personal history of other diseases of digestive system 08.10.12   Gastric ulcer    Past Surgical History:  Procedure Laterality Date  . ABDOMINAL HERNIA REPAIR  1972  . APPENDECTOMY  1973  . CATARACT EXTRACTION, BILATERAL  2005  . CHOLECYSTECTOMY  08/2016  . ESOPHAGOGASTRODUODENOSCOPY  06.2011  . EYE SURGERY  1972   Bilateral Lens Replacement  . FACIAL RECONSTRUCTION SURGERY  2008  . LEFT HEART CATH  02.17.2014  . [place], 2008   Dislocated Jaw  . PARS PLANA VITRECTOMY W/ REPAIR OF MACULAR HOLE  2005   macular hole repair, right eye  . PARS PLANA VITRECTOMY W/ REPAIR OF MACULAR HOLE  2010   mechanical, left eye  . PROSTATE BIOPSY  Jully 2017   w/ Dr. [place] Crane  . WISDOM TOOTH  EXTRACTION      There were no vitals filed for this visit.      Subjective Assessment - 12/09/16 0754    Subjective doing "okay" - no new complaints   Patient Stated Goals reduce pain, wean from medication   Currently in Pain? Yes   Pain Score 2    Pain Location Shoulder   Pain Orientation Right   Pain Descriptors / Indicators Sore   Pain Type Acute pain                         [facility] Adult PT Treatment/Exercise - 12/09/16 0756      Neuro Re-ed    Neuro Re-ed Details  SL cone knock overs 2 x 7 each; narrow BOS on foam - vertical and horizontal head turns x 10 each; tandem stance on foam 2 x 30 seconds; tandem walking 4 x 30 feet; bwds walking 2 x 30 feet;     Shoulder Exercises: Seated   Horizontal ABduction Both;15 reps;Theraband   Theraband Level (Shoulder Horizontal ABduction) Level 3 (Green)   External Rotation Both;15 reps;Theraband   Theraband Level (Shoulder External Rotation) Level 3 (Green)     Shoulder Exercises: Standing   External Rotation Right;15 reps;Theraband  Theraband Level (Shoulder External Rotation) Level 3 (Green)   Internal Rotation Right;15 reps;Theraband   Theraband Level (Shoulder Internal Rotation) Level 3 (Green)   Row Both;15 reps;Theraband   Theraband Level (Shoulder Row) Level 3 (Green)   Other Standing Exercises cabinet reaches - flexion and abduction x 15 each - 3# R UE     Shoulder Exercises: ROM/Strengthening   UBE (Upper Arm Bike) L3 x 6 min (3/3)     Shoulder Exercises: Stretch   Other Shoulder Stretches B UT stretch 3 x 30 seconds                  PT Short Term Goals - 11/25/16 3825      PT SHORT TERM GOAL #1   Title Patient to improve R shoulder AROM to WNL without pain (11/26/16)   Status Partially Met  6.26.18: R shoulder AROM flex, abd, IR still ~ 5-10 dg less than L shoulder      PT SHORT TERM GOAL #2   Title Patient to improve Berg Balance to 51/56 (11/26/16)   Status Achieved  6.26.18: BERG  Balance 54/56           PT Long Term Goals - 11/28/16 0816      PT LONG TERM GOAL #1   Title Patient to be independent with advacned HEP (12/24/16)   Status Partially Met  6.29.18: met for current      PT LONG TERM GOAL #2   Title Patient to improve R UE gross strength to 4+/5 without pain (12/24/16)   Status On-going  6.29.18: still grossly 4/5 strength      PT LONG TERM GOAL #3   Title Patient to demonstrate ability to ambulate over various surfaces for > 500 feet without LOB or fall demonstrating improved balance and functional mobility (12/24/16)   Status Achieved     PT LONG TERM GOAL #4   Title Patient to demonstrate ability to reach overhead without pain (12/24/16)   Status Achieved     PT LONG TERM GOAL #5   Title Patient to report reduction in LBP by 50% (12/24/16)   Status Achieved  6.29.18: Pt. noting 90% improvement in LBP.  "LBP not really bothering me now".  some LBP after standing still >10 min.                Plan - 12/09/16 0756    Clinical Impression Statement Patient doing well today with subjective reports of reduced shoulder pain. PT session today focusing on both shoulder strengthening as well as balance with patient able to perform all balance tasks with minimal LOB and only SUP from PT.   PT Treatment/Interventions ADLs/Self Care Home Management;Cryotherapy;Electrical Stimulation;Iontophoresis 26m/ml Dexamethasone;Moist Heat;Traction;Ultrasound;Neuromuscular re-education;Balance training;Therapeutic exercise;Therapeutic activities;Functional mobility training;Stair training;Patient/family education;Manual techniques;Passive range of motion;Vasopneumatic Device;Taping;Dry needling   Consulted and Agree with Plan of Care Patient      Patient will benefit from skilled therapeutic intervention in order to improve the following deficits and impairments:  Decreased activity tolerance, Decreased balance, Decreased range of motion, Decreased mobility,  Decreased strength, Impaired UE functional use, Pain  Visit Diagnosis: Other abnormalities of gait and mobility  Acute pain of right shoulder  Chronic low back pain, unspecified back pain laterality, with sciatica presence unspecified  Unsteadiness on feet     Problem List Patient Active Problem List   Diagnosis Date Noted  . Left shoulder pain 05/19/2016  . Urinary hesitancy 10/18/2014  . Trapezius muscle spasm 10/18/2014  . Idiopathic neuropathy  10/18/2014  . Allergic reaction 09/28/2014  . Hyperglycemia 09/28/2014  . Acute bacterial bronchitis 08/18/2014  . Hematuria 08/18/2014  . Screening, ischemic heart disease 06/16/2014  . Lumbar radiculopathy, chronic 05/19/2014  . Midline thoracic back pain 03/24/2014  . BPPV (benign paroxysmal positional vertigo) 01/20/2014  . Right-sided low back pain with right-sided sciatica 12/21/2013  . Lightheadedness 09/22/2013  . Porokeratosis 02/25/2013  . Pain in joint, ankle and foot 02/25/2013  . Lumbosacral pain 02/15/2013  . Abdominal pain, epigastric 02/15/2013  . Acid reflux 02/15/2013  . Hearing loss 02/15/2013  . Preventive measure 02/15/2013  . Chest pain 07/19/2012  . Bradycardia, sinus 07/19/2012  . Borderline glaucoma 03/15/2012  . 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 Dislocated inferior maxilla 01/10/2011  . H/O gastric ulcer 01/10/2011  . Back pain, chronic 11/21/2010    Lanney Gins, PT, DPT 12/09/16 8:48 AM   [facility] [address]  [place], [place], [address] Phone: [phone]   Fax:  [phone]  Name: JOHNOTHAN BASCOMB MRN: [id number] Date of Birth: 1945/12/24

## 2016-12-12 ENCOUNTER — Ambulatory Visit: Payer: MEDICARE

## 2016-12-12 DIAGNOSIS — G8929 Other chronic pain: Secondary | ICD-10-CM

## 2016-12-12 DIAGNOSIS — M545 Low back pain: Secondary | ICD-10-CM

## 2016-12-12 DIAGNOSIS — R2689 Other abnormalities of gait and mobility: Secondary | ICD-10-CM

## 2016-12-12 DIAGNOSIS — M25511 Pain in right shoulder: Secondary | ICD-10-CM

## 2016-12-12 DIAGNOSIS — R2681 Unsteadiness on feet: Secondary | ICD-10-CM

## 2016-12-12 NOTE — Therapy (Signed)
[facility] [address]  [place], [place], [address] Phone: [phone]   Fax:  [phone]  Physical Therapy Treatment  Patient Details  Name: Brian [name] MRN: [id number] Date of Birth: [birth year] Referring Provider: Mackie Pai, PA-C  Encounter Date: 12/12/2016      PT End of Session - 12/12/16 0824    Visit Number 13   Number of Visits 16   Date for PT Re-Evaluation 12/24/16   Authorization Type Medicare   PT Start Time 0818   PT Stop Time 0913   PT Time Calculation (min) 55 min   Activity Tolerance Patient tolerated treatment well   Behavior During Therapy [facility] for tasks assessed/performed      Past Medical History:  Diagnosis Date  . Atypical chest pain     Negative cardiac catheterization 2014  . 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 Blind right eye   . 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 Chicken pox   . Chronic back pain   . Dislocation, jaw 1960s  . Edema    Left Leg  . Essential hypertension, benign   . GERD (gastroesophageal reflux disease)   . [place] measles   . Heart attack ([place])   . Hyperlipidemia   . Measles   . Mumps   . Osteopenia 11.12.10   bone density test  . Personal history of other diseases of digestive system 08.10.12   Gastric ulcer    Past Surgical History:  Procedure Laterality Date  . ABDOMINAL HERNIA REPAIR  1972  . APPENDECTOMY  1973  . CATARACT EXTRACTION, BILATERAL  2005  . CHOLECYSTECTOMY  08/2016  . ESOPHAGOGASTRODUODENOSCOPY  06.2011  . EYE SURGERY  1972   Bilateral Lens Replacement  . FACIAL RECONSTRUCTION SURGERY  2008  . LEFT HEART CATH  02.17.2014  . [place], 2008   Dislocated Jaw  . PARS PLANA VITRECTOMY W/ REPAIR OF MACULAR HOLE  2005   macular hole repair, right eye  . PARS PLANA VITRECTOMY W/ REPAIR OF MACULAR HOLE  2010   mechanical, left eye  . PROSTATE BIOPSY  Jul[place] 2017   w/ Dr. [place] Crane  . WISDOM TOOTH  EXTRACTION      There were no vitals filed for this visit.      Subjective Assessment - 12/12/16 0819    Subjective Pt. noting he feels he has improved with therapy however frustrated with ongoing arthritis pain in bilateral.     Patient Stated Goals reduce pain, wean from medication   Currently in Pain? Yes   Pain Score 5    Pain Location Shoulder   Pain Orientation Right   Pain Descriptors / Indicators Sore   Pain Type Acute pain   Pain Radiating Towards radiates into R sided UT and radiates into R lateral upper arm   Multiple Pain Sites No                         [facility] Adult PT Treatment/Exercise - 12/12/16 0831      Self-Care   Self-Care Other Self-Care Comments   Other Self-Care Comments  Discussion and adjustment to comprehensive HEP increasing band resistance as appropriate; pt. issue full printout of comprehensive HEP with band color noted on each activity     Neuro Re-ed    Neuro Re-ed Details  narrow stance with diagonal head turns on airex pad x 1 min each way; 8 cone side  step over x 4 laps; Tandem stance on foam at counter x 20 sec each; SLS at counter on foam 2 x 20 sec each  Instructed to stand on foam or pillow for tandem and SLS HEP     Lumbar Exercises: Supine   Bridge 15 reps;5 seconds   Bridge Limitations with blue TB around knees with sustained hip abd/ER   pt. instructed to use blue TB for HEP     Lumbar Exercises: Sidelying   Clam 10 reps;3 seconds   Clam Limitations B sidelying clam shell with blue TB   Pt. instructed to use blue TB for HEP      Shoulder Exercises: Standing   External Rotation Right;15 reps;Theraband  Pt. instructed to use green TB for HEP   Theraband Level (Shoulder External Rotation) Level 3 (Green)   Internal Rotation Right;15 reps;Theraband  pt. instructed to use green TB for HEP   Theraband Level (Shoulder Internal Rotation) Level 3 (Green)   Extension Both;15 reps;Theraband  Pt. instructed to continue  using green TB for HEP   Theraband Level (Shoulder Extension) Level 3 (Green)   Row Both;15 reps;Theraband  Pt. instructed to start using blue TB for HEP   Theraband Level (Shoulder Row) Level 4 (Blue)     Shoulder Exercises: ROM/Strengthening   UBE (Upper Arm Bike) L3 x 6 min (3/3)                PT Education - 12/12/16 2025    Education provided Yes   Education Details blue loopted TB issued to pt. for clam shell and bridge, blue TB issued to pt. for row    Person(s) Educated Patient   Methods Explanation;Demonstration;Verbal cues;Handout   Comprehension Verbalized understanding;Returned demonstration;Verbal cues required;Need further instruction          PT Short Term Goals - 11/25/16 4270      PT SHORT TERM GOAL #1   Title Patient to improve R shoulder AROM to WNL without pain (11/26/16)   Status Partially Met  6.26.18: R shoulder AROM flex, abd, IR still ~ 5-10 dg less than L shoulder      PT SHORT TERM GOAL #2   Title Patient to improve Berg Balance to 51/56 (11/26/16)   Status Achieved  6.26.18: BERG Balance 54/56           PT Long Term Goals - 11/28/16 0816      PT LONG TERM GOAL #1   Title Patient to be independent with advacned HEP (12/24/16)   Status Partially Met  6.29.18: met for current      PT LONG TERM GOAL #2   Title Patient to improve R UE gross strength to 4+/5 without pain (12/24/16)   Status On-going  6.29.18: still grossly 4/5 strength      PT LONG TERM GOAL #3   Title Patient to demonstrate ability to ambulate over various surfaces for > 500 feet without LOB or fall demonstrating improved balance and functional mobility (12/24/16)   Status Achieved     PT LONG TERM GOAL #4   Title Patient to demonstrate ability to reach overhead without pain (12/24/16)   Status Achieved     PT LONG TERM GOAL #5   Title Patient to report reduction in LBP by 50% (12/24/16)   Status Achieved  6.29.18: Pt. noting 90% improvement in LBP.  "LBP not  really bothering me now".  some LBP after standing still >10 min.  Plan - 12/12/16 5732    Clinical Impression Statement Pt. seen today with complaint of increased R shoulder pain, which he attributes to flare up of arthritis.  Treatment focusing on review and update of comprehensive HEP to advance balance and band resistance with shoulder/scapular exercises at home.  Pt. tolerated advancement well today and issued handout of full HEP with appropriate band color noted on each activity.  Pt. ending treatment pain free at shoulder and verbalized understanding of updated HEP in prep for transition to home program.     PT Treatment/Interventions ADLs/Self Care Home Management;Cryotherapy;Electrical Stimulation;Iontophoresis 40m/ml Dexamethasone;Moist Heat;Traction;Ultrasound;Neuromuscular re-education;Balance training;Therapeutic exercise;Therapeutic activities;Functional mobility training;Stair training;Patient/family education;Manual techniques;Passive range of motion;Vasopneumatic Device;Taping;Dry needling   PT Next Visit Plan Tolerance to updated HEP; continue preparation for transition for home program      Patient will benefit from skilled therapeutic intervention in order to improve the following deficits and impairments:  Decreased activity tolerance, Decreased balance, Decreased range of motion, Decreased mobility, Decreased strength, Impaired UE functional use, Pain  Visit Diagnosis: Other abnormalities of gait and mobility  Acute pain of right shoulder  Chronic low back pain, unspecified back pain laterality, with sciatica presence unspecified  Unsteadiness on feet     Problem List Patient Active Problem List   Diagnosis Date Noted  . Left shoulder pain 05/19/2016  . Urinary hesitancy 10/18/2014  . Trapezius muscle spasm 10/18/2014  . Idiopathic neuropathy 10/18/2014  . Allergic reaction 09/28/2014  . Hyperglycemia 09/28/2014  . Acute bacterial  bronchitis 08/18/2014  . Hematuria 08/18/2014  . Screening, ischemic heart disease 06/16/2014  . Lumbar radiculopathy, chronic 05/19/2014  . Midline thoracic back pain 03/24/2014  . BPPV (benign paroxysmal positional vertigo) 01/20/2014  . Right-sided low back pain with right-sided sciatica 12/21/2013  . Lightheadedness 09/22/2013  . Porokeratosis 02/25/2013  . Pain in joint, ankle and foot 02/25/2013  . Lumbosacral pain 02/15/2013  . Abdominal pain, epigastric 02/15/2013  . Acid reflux 02/15/2013  . Hearing loss 02/15/2013  . Preventive measure 02/15/2013  . Chest pain 07/19/2012  . Bradycardia, sinus 07/19/2012  . Borderline glaucoma 03/15/2012  . 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 Dislocated inferior maxilla 01/10/2011  . H/O gastric ulcer 01/10/2011  . Back pain, chronic 11/21/2010    MBess Harvest PTA 12/12/16 9:28 AM  [facility] [place] [address]Phone: [phone]  Fax:  [phone] Name: Brian TAHAMRN: [id number] of Birth: [birth year]

## 2016-12-16 ENCOUNTER — Ambulatory Visit: Payer: MEDICARE

## 2016-12-16 DIAGNOSIS — G8929 Other chronic pain: Secondary | ICD-10-CM

## 2016-12-16 DIAGNOSIS — M545 Low back pain: Secondary | ICD-10-CM

## 2016-12-16 DIAGNOSIS — R2689 Other abnormalities of gait and mobility: Secondary | ICD-10-CM | POA: Diagnosis not present

## 2016-12-16 DIAGNOSIS — M25511 Pain in right shoulder: Secondary | ICD-10-CM

## 2016-12-16 DIAGNOSIS — R2681 Unsteadiness on feet: Secondary | ICD-10-CM

## 2016-12-16 NOTE — Therapy (Signed)
[facility] [address]  [place], [place], [address] Phone: [phone]   Fax:  [phone]  Physical Therapy Treatment  Patient Details  Name: Brian [name] MRN: [id number] Date of Birth: 1945/07/16 Referring Provider: Mackie Pai, PA-C  Encounter Date: 12/16/2016      PT End of Session - 12/16/16 0802    Visit Number 14   Number of Visits 16   Date for PT Re-Evaluation 12/24/16   Authorization Type Medicare   PT Start Time 0758   PT Stop Time 0840   PT Time Calculation (min) 42 min   Activity Tolerance Patient tolerated treatment well   Behavior During Therapy [facility] for tasks assessed/performed      Past Medical History:  Diagnosis Date  . Atypical chest pain     Negative cardiac catheterization 2014  . 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 Blind right eye   . 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 Chicken pox   . Chronic back pain   . Dislocation, jaw 1960s  . Edema    Left Leg  . Essential hypertension, benign   . GERD (gastroesophageal reflux disease)   . [place] measles   . Heart attack ([place])   . Hyperlipidemia   . Measles   . Mumps   . Osteopenia 11.12.10   bone density test  . Personal history of other diseases of digestive system 08.10.12   Gastric ulcer    Past Surgical History:  Procedure Laterality Date  . ABDOMINAL HERNIA REPAIR  1972  . APPENDECTOMY  1973  . CATARACT EXTRACTION, BILATERAL  2005  . CHOLECYSTECTOMY  08/2016  . ESOPHAGOGASTRODUODENOSCOPY  06.2011  . EYE SURGERY  1972   Bilateral Lens Replacement  . FACIAL RECONSTRUCTION SURGERY  2008  . LEFT HEART CATH  02.17.2014  . [place], 2008   Dislocated Jaw  . PARS PLANA VITRECTOMY W/ REPAIR OF MACULAR HOLE  2005   macular hole repair, right eye  . PARS PLANA VITRECTOMY W/ REPAIR OF MACULAR HOLE  2010   mechanical, left eye  . PROSTATE BIOPSY  Jul[place] 2017   w/ Dr. [place] Crane  . WISDOM TOOTH  EXTRACTION      There were no vitals filed for this visit.      Subjective Assessment - 12/16/16 0801    Subjective Pt. noting no issues with updated HEP.     Patient Stated Goals reduce pain, wean from medication   Currently in Pain? Yes   Pain Score 4    Pain Location Shoulder   Pain Orientation Right   Pain Descriptors / Indicators Sore   Pain Type Acute pain   Pain Radiating Towards radiates into R upper lateral arm    Pain Frequency Intermittent   Aggravating Factors  reaching above head    Multiple Pain Sites No                         [facility] Adult PT Treatment/Exercise - 12/16/16 0824      Self-Care   Self-Care Other Self-Care Comments   Other Self-Care Comments  Discussion of fall prevent community resource handout for possible options for pt. post-d/c      Neuro Re-ed    Neuro Re-ed Details  Alternating toe-tap to 8" step standing on airex pad x 20 reps each; no UE support      Shoulder Exercises: Standing   External Rotation 15  reps;Theraband;Both  high tension    Theraband Level (Shoulder External Rotation) Level 3 (Green)   Internal Rotation 15 reps;Theraband;Both  high tension    Theraband Level (Shoulder Internal Rotation) Level 3 (Green)   Extension Theraband;20 reps   Theraband Level (Shoulder Extension) Level 3 (Green)   Row Both;Theraband;20 reps   Theraband Level (Shoulder Row) Level 4 (Blue)   Other Standing Exercises B shoulder ER with red TB at 90 abd x 15 reps   Other Standing Exercises cabinet reaches with alternating shoulder 3# flexion to 2nd and 3rd shelf x 90 sec      Shoulder Exercises: ROM/Strengthening   Cybex Row 15 reps   Cybex Row Limitations 35#                PT Education - 12/16/16 [phone]    Education provided Yes   Education Details [employer] handout for fall prevention programs   Person(s) Educated Patient   Methods Explanation;Verbal cues;Handout   Comprehension Verbalized  understanding;Verbal cues required;Need further instruction          PT Short Term Goals - 11/25/16 6734      PT SHORT TERM GOAL #1   Title Patient to improve R shoulder AROM to WNL without pain (11/26/16)   Status Partially Met  6.26.18: R shoulder AROM flex, abd, IR still ~ 5-10 dg less than L shoulder      PT SHORT TERM GOAL #2   Title Patient to improve Berg Balance to 51/56 (11/26/16)   Status Achieved  6.26.18: BERG Balance 54/56           PT Long Term Goals - 11/28/16 0816      PT LONG TERM GOAL #1   Title Patient to be independent with advacned HEP (12/24/16)   Status Partially Met  6.29.18: met for current      PT LONG TERM GOAL #2   Title Patient to improve R UE gross strength to 4+/5 without pain (12/24/16)   Status On-going  6.29.18: still grossly 4/5 strength      PT LONG TERM GOAL #3   Title Patient to demonstrate ability to ambulate over various surfaces for > 500 feet without LOB or fall demonstrating improved balance and functional mobility (12/24/16)   Status Achieved     PT LONG TERM GOAL #4   Title Patient to demonstrate ability to reach overhead without pain (12/24/16)   Status Achieved     PT LONG TERM GOAL #5   Title Patient to report reduction in LBP by 50% (12/24/16)   Status Achieved  6.29.18: Pt. noting 90% improvement in LBP.  "LBP not really bothering me now".  some LBP after standing still >10 min.                Plan - 12/16/16 0804    Clinical Impression Statement Pt. doing well today.  Fall prevention, Psychologist, occupational to pt. today with discussion of appropriate programs for pt. to participate in pos-d/c.  Remainder of treatment focusing on balance and shoulder strengthening activities.  Strengthening activities focusing on remaining deficits as pt. has not yet met shoulder strength goal.  Will continue preparation for transition to HEP in coming visits.     PT Treatment/Interventions ADLs/Self Care Home  Management;Cryotherapy;Electrical Stimulation;Iontophoresis 79m/ml Dexamethasone;Moist Heat;Traction;Ultrasound;Neuromuscular re-education;Balance training;Therapeutic exercise;Therapeutic activities;Functional mobility training;Stair training;Patient/family education;Manual techniques;Passive range of motion;Vasopneumatic Device;Taping;Dry needling   PT Next Visit Plan Continue preparation for transition for home program  Patient will benefit from skilled therapeutic intervention in order to improve the following deficits and impairments:  Decreased activity tolerance, Decreased balance, Decreased range of motion, Decreased mobility, Decreased strength, Impaired UE functional use, Pain  Visit Diagnosis: Other abnormalities of gait and mobility  Acute pain of right shoulder  Chronic low back pain, unspecified back pain laterality, with sciatica presence unspecified  Unsteadiness on feet     Problem List Patient Active Problem List   Diagnosis Date Noted  . Left shoulder pain 05/19/2016  . Urinary hesitancy 10/18/2014  . Trapezius muscle spasm 10/18/2014  . Idiopathic neuropathy 10/18/2014  . Allergic reaction 09/28/2014  . Hyperglycemia 09/28/2014  . Acute bacterial bronchitis 08/18/2014  . Hematuria 08/18/2014  . Screening, ischemic heart disease 06/16/2014  . Lumbar radiculopathy, chronic 05/19/2014  . Midline thoracic back pain 03/24/2014  . BPPV (benign paroxysmal positional vertigo) 01/20/2014  . Right-sided low back pain with right-sided sciatica 12/21/2013  . Lightheadedness 09/22/2013  . Porokeratosis 02/25/2013  . Pain in joint, ankle and foot 02/25/2013  . Lumbosacral pain 02/15/2013  . Abdominal pain, epigastric 02/15/2013  . Acid reflux 02/15/2013  . Hearing loss 02/15/2013  . Preventive measure 02/15/2013  . Chest pain 07/19/2012  . Bradycardia, sinus 07/19/2012  . Borderline glaucoma 03/15/2012  . 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 Dislocated inferior maxilla 01/10/2011  . H/O gastric ulcer 01/10/2011  . Back pain, chronic 11/21/2010    Bess Harvest, PTA 12/16/16 9:07 AM   [facility] [address]  [place], [place], [address] Phone: [phone]   Fax:  [phone]  Name: Brian [name] MRN: [id number] Date of Birth: [birth year]

## 2016-12-16 NOTE — Patient Instructions (Signed)

## 2016-12-19 ENCOUNTER — Ambulatory Visit: Payer: MEDICARE

## 2016-12-23 ENCOUNTER — Ambulatory Visit: Payer: MEDICARE | Admitting: Physical Therapy

## 2016-12-23 DIAGNOSIS — R2689 Other abnormalities of gait and mobility: Secondary | ICD-10-CM

## 2016-12-23 DIAGNOSIS — M545 Low back pain: Secondary | ICD-10-CM

## 2016-12-23 DIAGNOSIS — M25511 Pain in right shoulder: Secondary | ICD-10-CM

## 2016-12-23 DIAGNOSIS — G8929 Other chronic pain: Secondary | ICD-10-CM

## 2016-12-23 DIAGNOSIS — R2681 Unsteadiness on feet: Secondary | ICD-10-CM

## 2016-12-23 NOTE — Therapy (Signed)
[facility] [address]  [place], [place], [address] Phone: [phone]   Fax:  [phone]  Physical Therapy Treatment  Patient Details  Name: Brian [name] MRN: [id number] Date of Birth: [birth year] Referring Provider: Mackie Pai, PA-C  Encounter Date: 12/23/2016      PT End of Session - 12/23/16 0801    Visit Number 15   Number of Visits 16   Date for PT Re-Evaluation 12/24/16   Authorization Type Medicare   PT Start Time 0758   PT Stop Time 0838   PT Time Calculation (min) 40 min   Activity Tolerance Patient tolerated treatment well   Behavior During Therapy [facility] for tasks assessed/performed      Past Medical History:  Diagnosis Date  . Atypical chest pain     Negative cardiac catheterization 2014  . 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 Blind right eye   . 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 Chicken pox   . Chronic back pain   . Dislocation, jaw 1960s  . Edema    Left Leg  . Essential hypertension, benign   . GERD (gastroesophageal reflux disease)   . [place] measles   . Heart attack ([place])   . Hyperlipidemia   . Measles   . Mumps   . Osteopenia 11.12.10   bone density test  . Personal history of other diseases of digestive system 08.10.12   Gastric ulcer    Past Surgical History:  Procedure Laterality Date  . ABDOMINAL HERNIA REPAIR  1972  . APPENDECTOMY  1973  . CATARACT EXTRACTION, BILATERAL  2005  . CHOLECYSTECTOMY  08/2016  . ESOPHAGOGASTRODUODENOSCOPY  06.2011  . EYE SURGERY  1972   Bilateral Lens Replacement  . FACIAL RECONSTRUCTION SURGERY  2008  . LEFT HEART CATH  02.17.2014  . [place], 2008   Dislocated Jaw  . PARS PLANA VITRECTOMY W/ REPAIR OF MACULAR HOLE  2005   macular hole repair, right eye  . PARS PLANA VITRECTOMY W/ REPAIR OF MACULAR HOLE  2010   mechanical, left eye  . PROSTATE BIOPSY  Jul[place] 2017   w/ Dr. Nevada Crane  . WISDOM TOOTH  EXTRACTION      There were no vitals filed for this visit.      Subjective Assessment - 12/23/16 0800    Subjective feeling well today   Patient Stated Goals reduce pain, wean from medication   Currently in Pain? Yes   Pain Score 5    Pain Location Shoulder   Pain Orientation Right   Pain Descriptors / Indicators Sore   Pain Type Acute pain            [facility] PT Assessment - 12/23/16 0001      Strength   Strength Assessment Site Shoulder   Right/Left Shoulder Right   Right Shoulder Flexion 4/5  "burning pain"   Right Shoulder ABduction 4/5  "burning pain"   Right Shoulder Internal Rotation 4+/5   Right Shoulder External Rotation 4/5                     [facility] Adult PT Treatment/Exercise - 12/23/16 0803      Lumbar Exercises: Aerobic   Stationary Bike NuStep L6 x 6 min (BUE/BLE)     Shoulder Exercises: Standing   External Rotation Right;15 reps;Theraband   Theraband Level (Shoulder External Rotation) Level 4 (Blue)   Internal Rotation Right;15 reps;Theraband   Theraband  Level (Shoulder Internal Rotation) Level 4 (Blue)   Extension Both;15 reps;Theraband   Theraband Level (Shoulder Extension) Level 4 (Blue)   Row Both;15 reps;Theraband   Theraband Level (Shoulder Row) Level 4 (Blue)     Shoulder Exercises: ROM/Strengthening   Rebounder tossing/catching red medball from rebounder x 3 min   Wall Pushups 15 reps  2 sets   Ball on Wall CW x 20; CCW x 20     Shoulder Exercises: Body Blade   Flexion 30 seconds;4 reps   Flexion Limitations B UE at 90 degree forward elevation - VC to produce movement at UE rather than wrists                  PT Short Term Goals - 2017-01-17 0801      PT SHORT TERM GOAL #1   Title Patient to improve R shoulder AROM to WNL without pain (11/26/16)   Status Achieved     PT SHORT TERM GOAL #2   Title Patient to improve Berg Balance to 51/56 (11/26/16)   Status Achieved           PT Long Term Goals -  January 17, 2017 0801      PT LONG TERM GOAL #1   Title Patient to be independent with advacned HEP (12/24/16)   Status Achieved     PT LONG TERM GOAL #2   Title Patient to improve R UE gross strength to 4+/5 without pain (12/24/16)   Status Partially Met     PT LONG TERM GOAL #3   Title Patient to demonstrate ability to ambulate over various surfaces for > 500 feet without LOB or fall demonstrating improved balance and functional mobility (12/24/16)   Status Achieved     PT LONG TERM GOAL #4   Title Patient to demonstrate ability to reach overhead without pain (12/24/16)   Status Achieved     PT LONG TERM GOAL #5   Title Patient to report reduction in LBP by 50% (12/24/16)   Status Achieved               Plan - Jan 17, 2017 6629    Clinical Impression Statement Patient has done excellent with PT intervention. Feels as of PT was beneficial with improved balance and functiona of R shoulder. Patient today meeting or partially meeting all goals with strength continuing to improve. Patient reporting pain persisting in R shoulder and feels as if he would beneift from injection by sports MD, as he has had this in the past and had good results. Patient and PT both agreeing upon d/c today with transition to HEP as patient is capable of this at this time. Will gladly see patient in the future for any other needs.    PT Treatment/Interventions ADLs/Self Care Home Management;Cryotherapy;Electrical Stimulation;Iontophoresis 6m/ml Dexamethasone;Moist Heat;Traction;Ultrasound;Neuromuscular re-education;Balance training;Therapeutic exercise;Therapeutic activities;Functional mobility training;Stair training;Patient/family education;Manual techniques;Passive range of motion;Vasopneumatic Device;Taping;Dry needling   PT Next Visit Plan d/c today   Consulted and Agree with Plan of Care Patient      Patient will benefit from skilled therapeutic intervention in order to improve the following deficits and  impairments:  Decreased activity tolerance, Decreased balance, Decreased range of motion, Decreased mobility, Decreased strength, Impaired UE functional use, Pain  Visit Diagnosis: Other abnormalities of gait and mobility  Acute pain of right shoulder  Chronic low back pain, unspecified back pain laterality, with sciatica presence unspecified  Unsteadiness on feet       G-Codes - [id number]   Functional  Assessment Tool Used (Outpatient Only) FOTO: 84 (16% limited)   Functional Limitation Mobility: Walking and moving around   Mobility: Walking and Moving Around Goal Status [phone]) At least 20 percent but less than 40 percent impaired, limited or restricted   Mobility: Walking and Moving Around Discharge Status [phone]) At least 1 percent but less than 20 percent impaired, limited or restricted      Problem List Patient Active Problem List   Diagnosis Date Noted  . Left shoulder pain 05/19/2016  . Urinary hesitancy 10/18/2014  . Trapezius muscle spasm 10/18/2014  . Idiopathic neuropathy 10/18/2014  . Allergic reaction 09/28/2014  . Hyperglycemia 09/28/2014  . Acute bacterial bronchitis 08/18/2014  . Hematuria 08/18/2014  . Screening, ischemic heart disease 06/16/2014  . Lumbar radiculopathy, chronic 05/19/2014  . Midline thoracic back pain 03/24/2014  . BPPV (benign paroxysmal positional vertigo) 01/20/2014  . Right-sided low back pain with right-sided sciatica 12/21/2013  . Lightheadedness 09/22/2013  . Porokeratosis 02/25/2013  . Pain in joint, ankle and foot 02/25/2013  . Lumbosacral pain 02/15/2013  . Abdominal pain, epigastric 02/15/2013  . Acid reflux 02/15/2013  . Hearing loss 02/15/2013  . Preventive measure 02/15/2013  . Chest pain 07/19/2012  . Bradycardia, sinus 07/19/2012  . Borderline glaucoma 03/15/2012  . 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 Dislocated inferior maxilla 01/10/2011  . H/O gastric ulcer 01/10/2011  . Back  pain, chronic 11/21/2010    Lanney Gins, PT, DPT 12/23/16 9:28 AM  PHYSICAL THERAPY DISCHARGE SUMMARY  Visits from Start of Care: 15  Current functional level related to goals / functional outcomes: See above   Remaining deficits: See above   Education / Equipment: HEP  Plan: Patient agrees to discharge.  Patient goals were partially met. Patient is being discharged due to being pleased with the current functional level.  ?????     Lanney Gins, PT, DPT 12/23/16 9:28 AM  [facility] [address]  [place], [place], [address] Phone: [phone]   Fax:  [phone]  Name: Brian [name] MRN: [id number] Date of Birth: [birth year]

## 2016-12-26 ENCOUNTER — Ambulatory Visit: Payer: MEDICARE | Admitting: Physical Therapy

## 2016-12-30 ENCOUNTER — Ambulatory Visit: Payer: MEDICARE | Admitting: Physical Therapy

## 2017-01-29 ENCOUNTER — Ambulatory Visit (INDEPENDENT_AMBULATORY_CARE_PROVIDER_SITE_OTHER): Payer: MEDICARE | Admitting: Medical

## 2017-01-29 DIAGNOSIS — L299 Pruritus, unspecified: Secondary | ICD-10-CM | POA: Diagnosis not present

## 2017-01-29 DIAGNOSIS — M25511 Pain in right shoulder: Secondary | ICD-10-CM

## 2017-01-29 DIAGNOSIS — G8929 Other chronic pain: Secondary | ICD-10-CM | POA: Diagnosis not present

## 2017-01-29 MED ORDER — TRIAMCINOLONE ACETONIDE 0.5 % EX OINT: 1.0000 "application " | TOPICAL_OINTMENT | Freq: Two times a day (BID) | CUTANEOUS | 0 refills | Status: DC

## 2017-01-29 MED FILL — TRIAMCINOLONE 0.5% OINTMENT: 0.5 | 20 days supply | Qty: 30 | Fill #0

## 2017-01-29 NOTE — Patient Instructions (Addendum)
For your right shoulder pain, I have placed the referral to sports medicine. I am asking them to see you by early next week or sooner(maybe tomorrow?) Use tramadol for pain increases. Sparingly as directed. He might inject the area or maybe do workup for rotator cuff injury.   For itching in prior areas of surgery/gallbladder site trocar scars, I prescribed triamcinolone ointment. For the hyperpigmentation you might try vitamin E oil twice daily.   We discussed her left lower extremity sensation of swelling. You acknowledge no visible swelling. No numbness or  tingling reported. No pain behind the knee. Also denies any back pain. Also this occurs very intermittently. Discussed potential workup and we decided would not proceed presently. But notify us if signs and symptoms change or worsen as this would direct workup.  Follow-up in one month or as needed.

## 2017-01-29 NOTE — Progress Notes (Signed)
Subjective:    Patient ID: Brian [name], male    DOB: 1945/09/14, 71 y.o.   MRN: [id number]  HPI   Pt in for hx of rt shoulder pain since May. Pain has been intermittent and he states he saw sports med for left shoulder and got injection and it did help. He states he had PT for rt shoulder but did not help. He indicates he wants referral to sports med for rt shoulder for possible injection. Over the last week the right shoulder has become more painful. However he does specified that at rest without moving the shoulder or right upper extremity pain is not much. But on movement the pain is moderate-to-severe.  See review of systems regarding his skin itching complaint over surgical site scars and subjective intermittent sensation of swelling left tibia-fibula/calf area.   Review of Systems  Constitutional: Negative for chills, fatigue and fever.  Respiratory: Negative for chest tightness, shortness of breath and wheezing.   Cardiovascular: Negative for chest pain and palpitations.  Gastrointestinal: Negative for abdominal pain.  Musculoskeletal: Negative for back pain.       Rt shoulder pain.  See neuro exam- no calf cramping,no tingling and no numbness.   Skin: Negative for rash.       Also states some mild randomly itching at surgical scar sites when he go gallbaldder removed.  Neurological: Negative for dizziness, syncope, weakness, light-headedness and headaches.       Some left pretibial area faint sensation of feeling swollen which comes and goes. Happens randomly. But does not get swollen.   Hematological: Negative for adenopathy. Does not bruise/bleed easily.  Psychiatric/Behavioral: Negative for behavioral problems and confusion.    Past Medical History:  Diagnosis Date  . Atypical chest pain     Negative cardiac catheterization 2014  . 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 Blind right eye   . 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 Chicken pox   .  Chronic back pain   . Dislocation, jaw 1960s  . Edema    Left Leg  . Essential hypertension, benign   . GERD (gastroesophageal reflux disease)   . [place] measles   . Heart attack ([place])   . Hyperlipidemia   . Measles   . Mumps   . Osteopenia 11.12.10   bone density test  . Personal history of other diseases of digestive system 08.10.12   Gastric ulcer     Social History   Social History  . Marital status: Single    Spouse name: N/A  . Number of children: N/A  . Years of education: N/A   Occupational History  . Not on file.   Social History Main Topics  . Smoking status: Former Smoker    Packs/day: 0.25    Years: 2.00    Types: Cigarettes    Quit date: 06/02/1966  . Smokeless tobacco: Never Used  . Alcohol use No  . Drug use: No  . Sexual activity: No   Other Topics Concern  . Not on file   Social History Narrative  . No narrative on file    Past Surgical History:  Procedure Laterality Date  . ABDOMINAL HERNIA REPAIR  1972  . APPENDECTOMY  1973  . CATARACT EXTRACTION, BILATERAL  2005  . CHOLECYSTECTOMY  08/2016  . ESOPHAGOGASTRODUODENOSCOPY  06.2011  . EYE SURGERY  1972   Bilateral Lens Replacement  . FACIAL RECONSTRUCTION SURGERY  2008  . LEFT HEART CATH  02.17.2014  . MANDIBLE  SURGERY  1968, 2008   Dislocated Jaw  . PARS PLANA VITRECTOMY W/ REPAIR OF MACULAR HOLE  2005   macular hole repair, right eye  . PARS PLANA VITRECTOMY W/ REPAIR OF MACULAR HOLE  2010   mechanical, left eye  . PROSTATE BIOPSY  [place] 2017   w/ Dr. [place] Crane  . WISDOM TOOTH EXTRACTION      Family History  Problem Relation Age of Onset  . Alzheimer's disease Mother        Deceased  . Diabetes Maternal Grandmother   . Heart attack Maternal Grandmother   . Prostate cancer Maternal Uncle   . Diabetes Maternal Uncle   . Healthy Son   . Cataracts Maternal Aunt     Allergies  Allergen Reactions  . Gabapentin Other (See Comments)    Feel "weird"; Nausea; Weakness; Syncope     Current Outpatient Prescriptions on File Prior to Visit  Medication Sig Dispense Refill  . albuterol (PROVENTIL HFA;VENTOLIN HFA) 108 (90 Base) MCG/ACT inhaler Inhale 2 puffs into the lungs every 6 (six) hours as needed for wheezing or shortness of breath. 1 Inhaler 0  . aspirin (ASPIR-81) 81 MG EC tablet Take 1 tablet by mouth daily.    Marland Kitchen b complex vitamins tablet Take 1 tablet by mouth daily.    . Calcium Carbonate-Vitamin D (CALCIUM-VITAMIN D3 PO) Take by mouth daily.    . fluticasone (FLOVENT HFA) 110 MCG/ACT inhaler Inhale 2 puffs into the lungs 2 (two) times daily at 10 AM and 5 PM. 1 Inhaler 5  . hydrochlorothiazide (MICROZIDE) 12.5 MG capsule Take 1 capsule (12.5 mg total) by mouth daily. 90 capsule 3  . mirabegron ER (MYRBETRIQ) 50 MG TB24 tablet Take 50 mg by mouth daily.    . pantoprazole (PROTONIX) 40 MG tablet Take 1 tablet (40 mg total) by mouth 2 (two) times daily. 180 tablet 3  . simvastatin (ZOCOR) 40 MG tablet Take 1 tablet (40 mg total) by mouth every evening. 90 tablet 3  . tamsulosin (FLOMAX) 0.4 MG CAPS capsule Take 2 capsules (0.8 mg total) by mouth daily. 90 capsule 1  . timolol (TIMOPTIC) 0.5 % ophthalmic solution Place 1 drop into the left eye daily. 10 mL 12  . traMADol (ULTRAM) 50 MG tablet Take 1 tablet (50 mg total) by mouth every 8 (eight) hours as needed. 12 tablet 0   No current facility-administered medications on file prior to visit.     There were no vitals taken for this visit.      Objective:   Physical Exam   General- No acute distress. Pleasant patient. Neck- Full range of motion, no jvd Lungs- Clear, even and unlabored. Heart- regular rate and rhythm. Neurologic- CNII- XII grossly intact.  Rt shoulder- bicep tendon area tenderness to palpation. Restricted range of motion. No warmth. No crepitus. Can't abduct rt upper ext above shoulder level.  Left lower ext- no pedal edema. Symmetric calfs compared to rt side. Negative homans  signs.     Assessment & Plan:  For your right shoulder pain, I have placed the referral to sports medicine. I am asking them to see you by early next week or sooner(maybe tomorrow?) Use tramadol for pain increases. Sparingly as directed. He might inject the area or maybe do workup for rotator cuff injury.   For itching in prior areas of surgery/gallbladder site trocar scars, I prescribed triamcinolone ointment. For the hyperpigmentation you might try vitamin E oil twice daily.   We discussed her left  lower extremity sensation of swelling. You acknowledge no visible swelling. No numbness or  tingling reported. No pain behind the knee. Also denies any back pain. Also this occurs very intermittently. Discussed potential workup and we decided would not proceed presently. But notify us if signs and symptoms change or worsen as this would direct workup.  Follow-up in one month or as needed.  Soniyah Mcglory, Percell Miller, PA-C

## 2017-01-30 ENCOUNTER — Encounter: Payer: Self-pay | Admitting: Family Medicine

## 2017-01-30 ENCOUNTER — Ambulatory Visit (INDEPENDENT_AMBULATORY_CARE_PROVIDER_SITE_OTHER): Payer: MEDICARE | Admitting: Family Medicine

## 2017-01-30 DIAGNOSIS — M25511 Pain in right shoulder: Secondary | ICD-10-CM | POA: Diagnosis present

## 2017-01-30 DIAGNOSIS — G8929 Other chronic pain: Secondary | ICD-10-CM

## 2017-01-30 MED ORDER — METHYLPREDNISOLONE ACETATE 40 MG/ML IJ SUSP
40.0000 mg | Freq: Once | INTRAMUSCULAR | Status: AC
  Administered 2017-01-30: 40 mg via INTRA_ARTICULAR

## 2017-01-30 NOTE — Patient Instructions (Signed)
You have rotator cuff impingement Try to avoid painful activities (overhead activities, lifting with extended arm) as much as possible. Aleve 2 tabs twice a day with food OR ibuprofen 3 tabs three times a day with food for pain and inflammation - try to minimize as these can irritate stomach, kidneys. Can take tylenol in addition to this. Subacromial injection may be beneficial to help with pain and to decrease inflammation - we did this today. Wait about 5-7 days then restart your home exercises. If not improving at follow-up we will consider further imaging (MRI). Follow up with me in 5-6 weeks.

## 2017-01-30 NOTE — Progress Notes (Signed)
PCP and consultation requested by: Mackie Pai, PA-C  Subjective:   HPI: Patient is a 71 y.o. male here for right shoulder pain.  Patient reports he's had about 3-4 months of lateral right shoulder pain. He reports having pain prior to injury where he tripped over dishwasher and landed onto right upper extremity but this worsened his pain. He did physical therapy which helped but he has continued to have pain worse past week. Pain level up to 8/10 and sharp. Feels similar to issue he had with left shoulder that responded to injection. Worse with picking up items. No night pain. No skin changes, numbness.  Past Medical History:  Diagnosis Date  . Atypical chest pain     Negative cardiac catheterization 2014  . 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 Blind right eye   . 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 Chicken pox   . Chronic back pain   . Dislocation, jaw 1960s  . Edema    Left Leg  . Essential hypertension, benign   . GERD (gastroesophageal reflux disease)   . [place] measles   . Heart attack ([place])   . Hyperlipidemia   . Measles   . Mumps   . Osteopenia 11.12.10   bone density test  . Personal history of other diseases of digestive system 08.10.12   Gastric ulcer    Current Outpatient Prescriptions on File Prior to Visit  Medication Sig Dispense Refill  . albuterol (PROVENTIL HFA;VENTOLIN HFA) 108 (90 Base) MCG/ACT inhaler Inhale 2 puffs into the lungs every 6 (six) hours as needed for wheezing or shortness of breath. 1 Inhaler 0  . aspirin (ASPIR-81) 81 MG EC tablet Take 1 tablet by mouth daily.    Marland Kitchen b complex vitamins tablet Take 1 tablet by mouth daily.    . Calcium Carbonate-Vitamin D (CALCIUM-VITAMIN D3 PO) Take by mouth daily.    . fluticasone (FLOVENT HFA) 110 MCG/ACT inhaler Inhale 2 puffs into the lungs 2 (two) times daily at 10 AM and 5 PM. 1 Inhaler 5  . hydrochlorothiazide (MICROZIDE) 12.5 MG capsule Take 1 capsule (12.5 mg total) by  mouth daily. 90 capsule 3  . mirabegron ER (MYRBETRIQ) 50 MG TB24 tablet Take 50 mg by mouth daily.    . pantoprazole (PROTONIX) 40 MG tablet Take 1 tablet (40 mg total) by mouth 2 (two) times daily. 180 tablet 3  . simvastatin (ZOCOR) 40 MG tablet Take 1 tablet (40 mg total) by mouth every evening. 90 tablet 3  . tamsulosin (FLOMAX) 0.4 MG CAPS capsule Take 2 capsules (0.8 mg total) by mouth daily. 90 capsule 1  . timolol (TIMOPTIC) 0.5 % ophthalmic solution Place 1 drop into the left eye daily. 10 mL 12  . traMADol (ULTRAM) 50 MG tablet Take 1 tablet (50 mg total) by mouth every 8 (eight) hours as needed. 12 tablet 0  . triamcinolone ointment (KENALOG) 0.5 % Apply 1 application topically 2 (two) times daily. 30 g 0   No current facility-administered medications on file prior to visit.     Past Surgical History:  Procedure Laterality Date  . ABDOMINAL HERNIA REPAIR  1972  . APPENDECTOMY  1973  . CATARACT EXTRACTION, BILATERAL  2005  . CHOLECYSTECTOMY  08/2016  . ESOPHAGOGASTRODUODENOSCOPY  06.2011  . EYE SURGERY  1972   Bilateral Lens Replacement  . FACIAL RECONSTRUCTION SURGERY  2008  . LEFT HEART CATH  02.17.2014  . [place], 2008   Dislocated Jaw  .  PARS PLANA VITRECTOMY W/ REPAIR OF MACULAR HOLE  2005   macular hole repair, right eye  . PARS PLANA VITRECTOMY W/ REPAIR OF MACULAR HOLE  2010   mechanical, left eye  . PROSTATE BIOPSY  [place] 2017   w/ Dr. [place] Crane  . WISDOM TOOTH EXTRACTION      Allergies  Allergen Reactions  . Gabapentin Other (See Comments)    Feel "weird"; Nausea; Weakness; Syncope    Social History   Social History  . Marital status: Single    Spouse name: N/A  . Number of children: N/A  . Years of education: N/A   Occupational History  . Not on file.   Social History Main Topics  . Smoking status: Former Smoker    Packs/day: 0.25    Years: 2.00    Types: Cigarettes    Quit date: 06/02/1966  . Smokeless tobacco: Never Used  .  Alcohol use No  . Drug use: No  . Sexual activity: No   Other Topics Concern  . Not on file   Social History Narrative  . No narrative on file    Family History  Problem Relation Age of Onset  . Alzheimer's disease Mother        Deceased  . Diabetes Maternal Grandmother   . Heart attack Maternal Grandmother   . Prostate cancer Maternal Uncle   . Diabetes Maternal Uncle   . Healthy Son   . Cataracts Maternal Aunt     BP (!) 139/93   Pulse (!) 57   Ht 5\' 6"  (1.676 m)   Wt 184 lb (83.5 kg)   BMI 29.70 kg/m   Review of Systems: See HPI above.     Objective:  Physical Exam:  Gen: NAD, comfortable in exam room  Right shoulder: No swelling, ecchymoses.  No gross deformity. No TTP. FROM with painful arc. Positive Hawkins, Neers. Negative Yergasons. Strength 5/5 with empty can and resisted internal/external rotation.  Pain empty can. Negative apprehension. NV intact distally.  Left shoulder: FROM without pain.   Assessment & Plan:  1. Right shoulder pain - 2/2 rotator cuff impingement though may also have strained this with his fall 3 months ago.  Exam is reassuring.  Aleve or ibuprofen.  Injection given today.  Restart home exercises in 5-7 days.  Consider ultrasound or MRI if not improving as expected.  F/u in 5-6 weeks.  After informed written consent timeout was performed, patient was seated on exam table. Right shoulder was prepped with alcohol swab and utilizing posterior approach, patient's right subacromial space was injected with 3:1 bupivicaine: depomedrol. Patient tolerated the procedure well without immediate complications.

## 2017-01-30 NOTE — Assessment & Plan Note (Signed)
2/2 rotator cuff impingement though may also have strained this with his fall 3 months ago.  Exam is reassuring.  Aleve or ibuprofen.  Injection given today.  Restart home exercises in 5-7 days.  Consider ultrasound or MRI if not improving as expected.  F/u in 5-6 weeks.  After informed written consent timeout was performed, patient was seated on exam table. Right shoulder was prepped with alcohol swab and utilizing posterior approach, patient's right subacromial space was injected with 3:1 bupivicaine: depomedrol. Patient tolerated the procedure well without immediate complications.

## 2017-02-16 ENCOUNTER — Telehealth: Payer: Self-pay | Admitting: Medical

## 2017-02-16 NOTE — Telephone Encounter (Signed)
°  Relation to DX:AJOI Call back number:[phone]  Reason for call:  Patient states he received a bill date of service for 08/15/16 and medicare will not cover ( PR OFFICE OUTPATIENT VISIT 25 MINUTES [78676] )and would like claim re submitted. Informed patient PCP submits claim according to what's discuss at the time of appointment, patient states he will contact Medicare regarding what code needs to be submitted and will call back to inform office, please advise

## 2017-02-18 NOTE — Progress Notes (Addendum)
Subjective:   Brian [name] is a 71 y.o. male who presents for Medicare Annual/Subsequent preventive examination.  Review of Systems:  No ROS.  Medicare Wellness Visit. Additional risk factors are reflected in the social history.    Sleep patterns: Handicap cousin sleeps with him. Wakes him frequently. Home Safety/Smoke Alarms: Feels safe in home. Smoke alarms in place.  Living environment; residence and Firearm Safety: Lives with cousin and 3 kids. 2 story home. Pt states he does okay with stairs. Seat Belt Safety/Bike Helmet: Wears seat belt.  Male:   CCS-  Pt states he is due again 2020. Pt states last colonoscopy normal. PSA-  Lab Results  Component Value Date   PSA 0.79 10/22/2016   PSA 0.65 03/07/2015   PSA 0.52 06/16/2014       Objective:    Vitals: There were no vitals taken for this visit.  There is no height or weight on file to calculate BMI.  Tobacco History  Smoking Status  . Former Smoker  . Packs/day: 0.25  . Years: 2.00  . Types: Cigarettes  . Quit date: 06/02/1966  Smokeless Tobacco  . Never Used     Counseling given: Not Answered   Past Medical History:  Diagnosis Date  . Atypical chest pain     Negative cardiac catheterization 2014  . 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 Blind right eye   . 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 Chicken pox   . Chronic back pain   . Dislocation, jaw 1960s  . Edema    Left Leg  . Essential hypertension, benign   . GERD (gastroesophageal reflux disease)   . [place] measles   . Heart attack ([place])   . Hyperlipidemia   . Measles   . Mumps   . Osteopenia 11.12.10   bone density test  . Personal history of other diseases of digestive system 08.10.12   Gastric ulcer   Past Surgical History:  Procedure Laterality Date  . ABDOMINAL HERNIA REPAIR  1972  . APPENDECTOMY  1973  . CATARACT EXTRACTION, BILATERAL  2005  . CHOLECYSTECTOMY  08/2016  . ESOPHAGOGASTRODUODENOSCOPY  06.2011  . EYE  SURGERY  1972   Bilateral Lens Replacement  . FACIAL RECONSTRUCTION SURGERY  2008  . LEFT HEART CATH  02.17.2014  . [place], 2008   Dislocated Jaw  . PARS PLANA VITRECTOMY W/ REPAIR OF MACULAR HOLE  2005   macular hole repair, right eye  . PARS PLANA VITRECTOMY W/ REPAIR OF MACULAR HOLE  2010   mechanical, left eye  . PROSTATE BIOPSY  [place] 2017   w/ Dr. [place] Crane  . WISDOM TOOTH EXTRACTION     Family History  Problem Relation Age of Onset  . Alzheimer's disease Mother        Deceased  . Diabetes Maternal Grandmother   . Heart attack Maternal Grandmother   . Prostate cancer Maternal Uncle   . Diabetes Maternal Uncle   . Healthy Son   . Cataracts Maternal Aunt    History  Sexual Activity  . Sexual activity: No    Outpatient Encounter Prescriptions as of 02/20/2017  Medication Sig  . albuterol (PROVENTIL HFA;VENTOLIN HFA) 108 (90 Base) MCG/ACT inhaler Inhale 2 puffs into the lungs every 6 (six) hours as needed for wheezing or shortness of breath.  Marland Kitchen aspirin (ASPIR-81) 81 MG EC tablet Take 1 tablet by mouth daily.  Marland Kitchen b complex vitamins tablet Take 1 tablet by mouth daily.  Marland Kitchen  Calcium Carbonate-Vitamin D (CALCIUM-VITAMIN D3 PO) Take by mouth daily.  . fluticasone (FLOVENT HFA) 110 MCG/ACT inhaler Inhale 2 puffs into the lungs 2 (two) times daily at 10 AM and 5 PM.  . hydrochlorothiazide (MICROZIDE) 12.5 MG capsule Take 1 capsule (12.5 mg total) by mouth daily.  . mirabegron ER (MYRBETRIQ) 50 MG TB24 tablet Take 50 mg by mouth daily.  . pantoprazole (PROTONIX) 40 MG tablet Take 1 tablet (40 mg total) by mouth 2 (two) times daily.  . simvastatin (ZOCOR) 40 MG tablet Take 1 tablet (40 mg total) by mouth every evening.  . tamsulosin (FLOMAX) 0.4 MG CAPS capsule Take 2 capsules (0.8 mg total) by mouth daily.  . timolol (TIMOPTIC) 0.5 % ophthalmic solution Place 1 drop into the left eye daily.  . traMADol (ULTRAM) 50 MG tablet Take 1 tablet (50 mg total) by mouth every 8  (eight) hours as needed.  . triamcinolone ointment (KENALOG) 0.5 % Apply 1 application topically 2 (two) times daily.   No facility-administered encounter medications on file as of 02/20/2017.     Activities of Daily Living In your present state of health, do you have any difficulty performing the following activities: 02/26/2016 02/26/2016  Hearing? Y Y  Comment Does not wear his hearing aids Doesn't wear his hearing aid  Vision? Y Y  Comment Wears corrective lenses Wears corrective lenses  Difficulty concentrating or making decisions? N -  Walking or climbing stairs? N -  Dressing or bathing? N -  Doing errands, shopping? N -  Some recent data might be hidden    Patient Care Team: Saguier, Iris Pert as PCP - General (Internal Medicine) Margaretmary Bayley, MD as Consulting Physician (Gastroenterology) Karie Chimera, PA-C as Physician Assistant (Pulmonary Disease) Antionette Fairy Isaias Cowman, MD as Consulting Physician (Ophthalmology) Bonnee Quin, PA-C as Physician Assistant (Neurosurgery) Pamala Hurry, MD as Consulting Physician (Urology)   Assessment:    Physical assessment deferred to PCP.  Exercise Activities and Dietary recommendations   Diet (meal preparation, eat out, water intake, caffeinated beverages, dairy products, fruits and vegetables): 24 hour recall Breakfast: oatmeal, toast, hot chocolate. Lunch: snacks Dinner:  Chicken or pork and rice or corn    Goals    . Increase physical activity          Start walking 3-4 times weekly. Increase as tolerated.    . Weight (lb) < 160 lb (72.6 kg)      Fall Risk Fall Risk  02/26/2016 01/08/2016 10/19/2015 10/18/2014 06/16/2014  Falls in the past year? Yes Yes No Yes Yes  Number falls in past yr: 1 1 - 2 or more 2 or more  Comment - Pt was putting clothes away in a dresser and turned around and fell and hit his head. No injury, no LOC. - - -  Injury with Fall? No No - No -  Risk for fall due to : Impaired balance/gait  Impaired vision;Impaired balance/gait - - -  Follow up Falls prevention discussed - - - -   Depression Screen PHQ 2/9 Scores 02/26/2016 01/08/2016 10/19/2015 10/18/2014  PHQ - 2 Score 0 0 0 0  PHQ- 9 Score 0 5 - -  Exception Documentation Patient refusal - - -    Cognitive Function MMSE - Mini Mental State Exam 01/08/2016  Orientation to time 5  Orientation to Place 5  Registration 3  Attention/ Calculation 5  Recall 3  Language- name 2 objects 2  Language- repeat 1  Language- follow 3 step  command 3  Language- read & follow direction 1  Write a sentence 1  Copy design 1  Total score 30        Immunization History  Administered Date(s) Administered  . Influenza,inj,Quad PF,6+ Mos 02/15/2013, 06/16/2014, 06/06/2015, 02/11/2016  . Influenza-Unspecified 03/19/2011, 04/26/2012  . Pneumococcal Conjugate-13 01/08/2016  . Pneumococcal Polysaccharide-23 11/21/2010, 06/06/2015  . Tdap 11/21/2010  . Zoster 02/15/2013   Screening Tests Health Maintenance  Topic Date Due  . Hepatitis C Screening  [birth year]  . INFLUENZA VACCINE  12/31/2016  . COLONOSCOPY  06/05/2017  . TETANUS/TDAP  11/20/2020  . PNA vac Low Risk Adult  Completed      Plan:   Follow up with PCP as directed.  Continue to eat heart healthy diet (full of fruits, vegetables, whole grains, lean protein, water--limit salt, fat, and sugar intake) and increase physical activity as tolerated.  Continue doing brain stimulating activities (puzzles, reading, adult coloring books, staying active) to keep memory sharp.   You received your flu vaccine today.  I have personally reviewed and noted the following in the patient's chart:   . Medical and social history . Use of alcohol, tobacco or illicit drugs  . Current medications and supplements . Functional ability and status . Nutritional status . Physical activity . Advanced directives . List of other physicians . Hospitalizations, surgeries, and ER visits in  previous 12 months . Vitals . Screenings to include cognitive, depression, and falls . Referrals and appointments  In addition, I have reviewed and discussed with patient certain preventive protocols, quality metrics, and best practice recommendations. A written personalized care plan for preventive services as well as general preventive health recommendations were provided to patient.     Shela Nevin, [place]  02/18/2017  I agree with assessment and plan of RN given on day of visit.  Saguier, Percell Miller, PA-C

## 2017-02-20 ENCOUNTER — Ambulatory Visit (INDEPENDENT_AMBULATORY_CARE_PROVIDER_SITE_OTHER): Payer: MEDICARE | Admitting: *Deleted

## 2017-02-20 ENCOUNTER — Encounter: Payer: Self-pay | Admitting: *Deleted

## 2017-02-20 DIAGNOSIS — Z23 Encounter for immunization: Secondary | ICD-10-CM

## 2017-02-20 DIAGNOSIS — Z0000 Encounter for general adult medical examination without abnormal findings: Secondary | ICD-10-CM

## 2017-02-20 NOTE — Patient Instructions (Signed)
Brian [name] , Thank you for taking time to come for your Medicare Wellness Visit. I appreciate your ongoing commitment to your health goals. Please review the following plan we discussed and let me know if I can assist you in the future.   These are the goals we discussed: Goals    . Increase physical activity          Start walking 3-4 times weekly. Increase as tolerated.    . Weight (lb) < 160 lb (72.6 kg)       This is a list of the screening recommended for you and due dates:  Health Maintenance  Topic Date Due  .  Hepatitis C: One time screening is recommended by Center for Disease Control  (CDC) for  adults born from [birth year] through 1967.   22-Apr-1946  . Flu Shot  12/31/2016  . Colon Cancer Screening  06/05/2017  . Tetanus Vaccine  11/20/2020  . Pneumonia vaccines  Completed   Continue to eat heart healthy diet (full of fruits, vegetables, whole grains, lean protein, water--limit salt, fat, and sugar intake) and increase physical activity as tolerated.  Continue doing brain stimulating activities (puzzles, reading, adult coloring books, staying active) to keep memory sharp.   You received your flu vaccine today.   Calorie Counting for Weight Loss Calories are units of energy. Your body needs a certain amount of calories from food to keep you going throughout the day. When you eat more calories than your body needs, your body stores the extra calories as fat. When you eat fewer calories than your body needs, your body burns fat to get the energy it needs. Calorie counting means keeping track of how many calories you eat and drink each day. Calorie counting can be helpful if you need to lose weight. If you make sure to eat fewer calories than your body needs, you should lose weight. Ask your health care provider what a healthy weight is for you. For calorie counting to work, you will need to eat the right number of calories in a day in order to lose a healthy amount of weight per  week. A dietitian can help you determine how many calories you need in a day and will give you suggestions on how to reach your calorie goal.  A healthy amount of weight to lose per week is usually 1-2 lb (0.5-0.9 kg). This usually means that your daily calorie intake should be reduced by 500-750 calories.  Eating 1,200 - 1,500 calories per day can help most women lose weight.  Eating 1,500 - 1,800 calories per day can help most men lose weight.  What is my plan? My goal is to have __________ calories per day. If I have this many calories per day, I should lose around __________ pounds per week. What do I need to know about calorie counting? In order to meet your daily calorie goal, you will need to:  Find out how many calories are in each food you would like to eat. Try to do this before you eat.  Decide how much of the food you plan to eat.  Write down what you ate and how many calories it had. Doing this is called keeping a food log.  To successfully lose weight, it is important to balance calorie counting with a healthy lifestyle that includes regular activity. Aim for 150 minutes of moderate exercise (such as walking) or 75 minutes of vigorous exercise (such as running) each week. Where do I  find calorie information?  The number of calories in a food can be found on a Nutrition Facts label. If a food does not have a Nutrition Facts label, try to look up the calories online or ask your dietitian for help. Remember that calories are listed per serving. If you choose to have more than one serving of a food, you will have to multiply the calories per serving by the amount of servings you plan to eat. For example, the label on a package of bread might say that a serving size is 1 slice and that there are 90 calories in a serving. If you eat 1 slice, you will have eaten 90 calories. If you eat 2 slices, you will have eaten 180 calories. How do I keep a food log? Immediately after each meal,  record the following information in your food log:  What you ate. Don't forget to include toppings, sauces, and other extras on the food.  How much you ate. This can be measured in cups, ounces, or number of items.  How many calories each food and drink had.  The total number of calories in the meal.  Keep your food log near you, such as in a small notebook in your pocket, or use a mobile app or website. Some programs will calculate calories for you and show you how many calories you have left for the day to meet your goal. What are some calorie counting tips?  Use your calories on foods and drinks that will fill you up and not leave you hungry: ? Some examples of foods that fill you up are nuts and nut butters, vegetables, lean proteins, and high-fiber foods like whole grains. High-fiber foods are foods with more than 5 g fiber per serving. ? Drinks such as sodas, specialty coffee drinks, alcohol, and juices have a lot of calories, yet do not fill you up.  Eat nutritious foods and avoid empty calories. Empty calories are calories you get from foods or beverages that do not have many vitamins or protein, such as candy, sweets, and soda. It is better to have a nutritious high-calorie food (such as an avocado) than a food with few nutrients (such as a bag of chips).  Know how many calories are in the foods you eat most often. This will help you calculate calorie counts faster.  Pay attention to calories in drinks. Low-calorie drinks include water and unsweetened drinks.  Pay attention to nutrition labels for "low fat" or "fat free" foods. These foods sometimes have the same amount of calories or more calories than the full fat versions. They also often have added sugar, starch, or salt, to make up for flavor that was removed with the fat.  Find a way of tracking calories that works for you. Get creative. Try different apps or programs if writing down calories does not work for you. What are  some portion control tips?  Know how many calories are in a serving. This will help you know how many servings of a certain food you can have.  Use a measuring cup to measure serving sizes. You could also try weighing out portions on a kitchen scale. With time, you will be able to estimate serving sizes for some foods.  Take some time to put servings of different foods on your favorite plates, bowls, and cups so you know what a serving looks like.  Try not to eat straight from a bag or box. Doing this can lead to overeating.  Put the amount you would like to eat in a cup or on a plate to make sure you are eating the right portion.  Use smaller plates, glasses, and bowls to prevent overeating.  Try not to multitask (for example, watch TV or use your computer) while eating. If it is time to eat, sit down at a table and enjoy your food. This will help you to know when you are full. It will also help you to be aware of what you are eating and how much you are eating. What are tips for following this plan? Reading food labels  Check the calorie count compared to the serving size. The serving size may be smaller than what you are used to eating.  Check the source of the calories. Make sure the food you are eating is high in vitamins and protein and low in saturated and trans fats. Shopping  Read nutrition labels while you shop. This will help you make healthy decisions before you decide to purchase your food.  Make a grocery list and stick to it. Cooking  Try to cook your favorite foods in a healthier way. For example, try baking instead of frying.  Use low-fat dairy products. Meal planning  Use more fruits and vegetables. Half of your plate should be fruits and vegetables.  Include lean proteins like poultry and fish. How do I count calories when eating out?  Ask for smaller portion sizes.  Consider sharing an entree and sides instead of getting your own entree.  If you get your own  entree, eat only half. Ask for a box at the beginning of your meal and put the rest of your entree in it so you are not tempted to eat it.  If calories are listed on the menu, choose the lower calorie options.  Choose dishes that include vegetables, fruits, whole grains, low-fat dairy products, and lean protein.  Choose items that are boiled, broiled, grilled, or steamed. Stay away from items that are buttered, battered, fried, or served with cream sauce. Items labeled "crispy" are usually fried, unless stated otherwise.  Choose water, low-fat milk, unsweetened iced tea, or other drinks without added sugar. If you want an alcoholic beverage, choose a lower calorie option such as a glass of wine or light beer.  Ask for dressings, sauces, and syrups on the side. These are usually high in calories, so you should limit the amount you eat.  If you want a salad, choose a garden salad and ask for grilled meats. Avoid extra toppings like bacon, cheese, or fried items. Ask for the dressing on the side, or ask for olive oil and vinegar or lemon to use as dressing.  Estimate how many servings of a food you are given. For example, a serving of cooked rice is  cup or about the size of half a baseball. Knowing serving sizes will help you be aware of how much food you are eating at restaurants. The list below tells you how big or small some common portion sizes are based on everyday objects: ? 1 oz-4 stacked dice. ? 3 oz-1 deck of cards. ? 1 tsp-1 die. ? 1 Tbsp- a ping-pong ball. ? 2 Tbsp-1 ping-pong ball. ?  cup- baseball. ? 1 cup-1 baseball. Summary  Calorie counting means keeping track of how many calories you eat and drink each day. If you eat fewer calories than your body needs, you should lose weight.  A healthy amount of weight to lose per week is  usually 1-2 lb (0.5-0.9 kg). This usually means reducing your daily calorie intake by 500-750 calories.  The number of calories in a food can be  found on a Nutrition Facts label. If a food does not have a Nutrition Facts label, try to look up the calories online or ask your dietitian for help.  Use your calories on foods and drinks that will fill you up, and not on foods and drinks that will leave you hungry.  Use smaller plates, glasses, and bowls to prevent overeating. This information is not intended to replace advice given to you by your health care provider. Make sure you discuss any questions you have with your health care provider. Document Released: 05/19/2005 Document Revised: 04/18/2016 Document Reviewed: 04/18/2016 Elsevier Interactive Patient Education  2017 [employer].

## 2017-02-24 ENCOUNTER — Ambulatory Visit (INDEPENDENT_AMBULATORY_CARE_PROVIDER_SITE_OTHER): Payer: MEDICARE | Admitting: Medical

## 2017-02-24 ENCOUNTER — Ambulatory Visit (HOSPITAL_BASED_OUTPATIENT_CLINIC_OR_DEPARTMENT_OTHER)
Admission: RE | Admit: 2017-02-24 | Discharge: 2017-02-24 | Disposition: A | Discharge status: Home / Self Care | Payer: MEDICARE | Source: Ambulatory Visit | Attending: Medical | Admitting: Medical

## 2017-02-24 ENCOUNTER — Encounter: Payer: Self-pay | Admitting: Medical

## 2017-02-24 VITALS — BP 118/60 | HR 55 | Temp 98.3°F | Resp 16 | Ht 66.0 in | Wt 185.6 lb

## 2017-02-24 DIAGNOSIS — R918 Other nonspecific abnormal finding of lung field: Secondary | ICD-10-CM | POA: Diagnosis not present

## 2017-02-24 DIAGNOSIS — R0600 Dyspnea, unspecified: Secondary | ICD-10-CM | POA: Diagnosis present

## 2017-02-24 DIAGNOSIS — J3089 Other allergic rhinitis: Secondary | ICD-10-CM | POA: Diagnosis not present

## 2017-02-24 DIAGNOSIS — R079 Chest pain, unspecified: Secondary | ICD-10-CM | POA: Diagnosis not present

## 2017-02-24 DIAGNOSIS — M940 Chondrocostal junction syndrome [Tietze]: Secondary | ICD-10-CM

## 2017-02-24 LAB — TROPONIN I: TNIDX: 0 ug/L (ref 0.00–0.06)

## 2017-02-24 LAB — BRAIN NATRIURETIC PEPTIDE: PRO B NATRI PEPTIDE: 21 pg/mL (ref 0.0–100.0)

## 2017-02-24 LAB — D-DIMER, QUANTITATIVE: D-Dimer, Quant: 0.25 mcg/mL FEU (ref <0.50)

## 2017-02-24 MED ORDER — LEVOCETIRIZINE DIHYDROCHLORIDE 5 MG PO TABS: 5.0000 mg | ORAL_TABLET | Freq: Every evening | ORAL | 0 refills | Status: DC

## 2017-02-24 MED ORDER — BENZONATATE 100 MG PO CAPS: 100.0000 mg | ORAL_CAPSULE | Freq: Two times a day (BID) | ORAL | 0 refills | Status: DC | PRN

## 2017-02-24 MED ORDER — AZITHROMYCIN 250 MG PO TABS: ORAL_TABLET | ORAL | 0 refills | Status: DC

## 2017-02-24 MED ORDER — ALBUTEROL SULFATE HFA 108 (90 BASE) MCG/ACT IN AERS: 2.0000 | INHALATION_SPRAY | Freq: Four times a day (QID) | RESPIRATORY_TRACT | 0 refills | Status: DC | PRN

## 2017-02-24 MED ORDER — KETOROLAC TROMETHAMINE 60 MG/2ML IM SOLN
60.0000 mg | Freq: Once | INTRAMUSCULAR | Status: AC
Start: 2017-02-24 — End: 2017-02-24
  Administered 2017-02-24: 60 mg via INTRAMUSCULAR

## 2017-02-24 MED FILL — BENZONATATE 100 MG CAP: 100 | 10 days supply | Qty: 20 | Fill #0

## 2017-02-24 MED FILL — AZITHROMYCIN 250 MG TABLET: 250 | 5 days supply | Qty: 6 | Fill #0

## 2017-02-24 MED FILL — LEVOCETIRIZINE 5 MG TABLET: 5 | 30 days supply | Qty: 30 | Fill #0

## 2017-02-24 NOTE — Progress Notes (Signed)
Subjective:    Patient ID: Brian [name], male    DOB: Feb 12, 1946, 71 y.o.   MRN: [id number]  HPI  Pt in reporting that he some recent anterior chest pain/costochondral junction region pain since Saturday. Pain would be on and off. He states if at rest and not moving he does not have pain but if he coughs or moves will have sharp pain for 3-4 seconds then subsides. No report of any jaw pain, no shoulder, no arm pain with sharp transient chest pain.  He reports no wheezing. He has some shortness of breath since Saturday. Some pain when climbs steps And walk  well feel some shortness of breath. No popliteal pain or leg swelling.   Pt has been nasal congested since Thursday. Runny nose and dry cough. Pt states neighbor cut his yard on Thursday and he was exposed to grass clippings. Pt does have dry cough all weekend.    Review of Systems  Constitutional: Positive for fatigue. Negative for chills and fever.  HENT: Positive for congestion, postnasal drip and sneezing. Negative for ear pain.   Respiratory: Positive for shortness of breath. Negative for chest tightness and wheezing.   Cardiovascular: Negative for chest pain and palpitations.       She history of present illness physical exam  Gastrointestinal: Negative for abdominal pain.  Musculoskeletal: Negative for back pain, myalgias and neck pain.       No leg pain  Skin: Negative for rash.  Neurological: Negative for dizziness, seizures, weakness, numbness and headaches.  Hematological: Negative for adenopathy. Does not bruise/bleed easily.  Psychiatric/Behavioral: Negative for behavioral problems, confusion and suicidal ideas.   Past Medical History:  Diagnosis Date  . Atypical chest pain     Negative cardiac catheterization 2014  . 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 Blind right eye   . 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 Chicken pox   . Chronic back pain   . Dislocation, jaw 1960s  . Edema    Left Leg    . Essential hypertension, benign   . GERD (gastroesophageal reflux disease)   . [place] measles   . Heart attack ([place])   . Hyperlipidemia   . Measles   . Mumps   . Osteopenia 11.12.10   bone density test  . Personal history of other diseases of digestive system 08.10.12   Gastric ulcer     Social History   Social History  . Marital status: Single    Spouse name: N/A  . Number of children: N/A  . Years of education: N/A   Occupational History  . Not on file.   Social History Main Topics  . Smoking status: Former Smoker    Packs/day: 0.25    Years: 2.00    Types: Cigarettes    Quit date: 06/02/1966  . Smokeless tobacco: Never Used  . Alcohol use No  . Drug use: No  . Sexual activity: No   Other Topics Concern  . Not on file   Social History Narrative  . No narrative on file    Past Surgical History:  Procedure Laterality Date  . ABDOMINAL HERNIA REPAIR  1972  . APPENDECTOMY  1973  . CATARACT EXTRACTION, BILATERAL  2005  . CHOLECYSTECTOMY  08/2016  . ESOPHAGOGASTRODUODENOSCOPY  06.2011  . EYE SURGERY  1972   Bilateral Lens Replacement  . FACIAL RECONSTRUCTION SURGERY  2008  . LEFT HEART CATH  02.17.2014  . [place], 2008  Dislocated Jaw  . PARS PLANA VITRECTOMY W/ REPAIR OF MACULAR HOLE  2005   macular hole repair, right eye  . PARS PLANA VITRECTOMY W/ REPAIR OF MACULAR HOLE  2010   mechanical, left eye  . PROSTATE BIOPSY  [place] 2017   w/ Dr. [place] Crane  . WISDOM TOOTH EXTRACTION      Family History  Problem Relation Age of Onset  . Alzheimer's disease Mother        Deceased  . Diabetes Maternal Grandmother   . Heart attack Maternal Grandmother   . Prostate cancer Maternal Uncle   . Diabetes Maternal Uncle   . Healthy Son   . Cataracts Maternal Aunt     Allergies  Allergen Reactions  . Gabapentin Other (See Comments)    Feel "weird"; Nausea; Weakness; Syncope    Current Outpatient Prescriptions on File Prior to Visit  Medication  Sig Dispense Refill  . aspirin (ASPIR-81) 81 MG EC tablet Take 1 tablet by mouth daily.    Marland Kitchen b complex vitamins tablet Take 1 tablet by mouth daily.    . Calcium Carbonate-Vitamin D (CALCIUM-VITAMIN D3 PO) Take by mouth daily.    . fluticasone (FLOVENT HFA) 110 MCG/ACT inhaler Inhale 2 puffs into the lungs 2 (two) times daily at 10 AM and 5 PM. 1 Inhaler 5  . hydrochlorothiazide (MICROZIDE) 12.5 MG capsule Take 1 capsule (12.5 mg total) by mouth daily. 90 capsule 3  . mirabegron ER (MYRBETRIQ) 50 MG TB24 tablet Take 50 mg by mouth daily.    . pantoprazole (PROTONIX) 40 MG tablet Take 1 tablet (40 mg total) by mouth 2 (two) times daily. 180 tablet 3  . simvastatin (ZOCOR) 40 MG tablet Take 1 tablet (40 mg total) by mouth every evening. 90 tablet 3  . tamsulosin (FLOMAX) 0.4 MG CAPS capsule Take 2 capsules (0.8 mg total) by mouth daily. 90 capsule 1  . timolol (TIMOPTIC) 0.5 % ophthalmic solution Place 1 drop into the left eye daily. 10 mL 12  . traMADol (ULTRAM) 50 MG tablet Take 1 tablet (50 mg total) by mouth every 8 (eight) hours as needed. 12 tablet 0  . triamcinolone ointment (KENALOG) 0.5 % Apply 1 application topically 2 (two) times daily. 30 g 0   No current facility-administered medications on file prior to visit.     BP 118/60   Pulse (!) 55   Temp 98.3 F (36.8 C) (Oral)   Resp 16   Ht 5\' 6"  (1.676 m)   Wt 185 lb 9.6 oz (84.2 kg)   SpO2 99%   BMI 29.96 kg/m       Objective:   Physical Exam  General  Mental Status - Alert. General Appearance - Well groomed. Not in acute distress.  Skin Rashes- No Rashes.  HEENT Head- Normal. Ear Auditory Canal - Left- Normal. Right - Normal.Tympanic Membrane- Left- Normal. Right- Normal. Eye Sclera/Conjunctiva- Left- Normal. Right- Normal. Nose & Sinuses Nasal Mucosa- Left-  Boggy and Congested. Right-  Boggy and  Congested.Bilateral maxillary and frontal sinus pressure. Mouth & Throat Lips: Upper Lip- Normal: no dryness,  cracking, pallor, cyanosis, or vesicular eruption. Lower Lip-Normal: no dryness, cracking, pallor, cyanosis or vesicular eruption. Buccal Mucosa- Bilateral- No Aphthous ulcers. Oropharynx- No Discharge or Erythema. Tonsils: Characteristics- Bilateral- No Erythema or Congestion. Size/Enlargement- Bilateral- No enlargement. Discharge- bilateral-None.  Neck Neck- Supple. No Masses. No JVD   Chest and Lung Exam Auscultation: Breath Sounds:-Clear even and unlabored.(on walking today in office pulse increase to 74.  O2 sat maintained at 96%). Appeared mild sob after walk.  Cardiovascular Auscultation:Rythm- Regular, rate and rhythm. Murmurs & Other Heart Sounds:Ausculatation of the heart reveal- No Murmurs.  Lymphatic Head & Neck General Head & Neck Lymphatics: Bilateral: Description- No Localized lymphadenopathy.   Abdomen- soft, nt, nd, +bs, no rebound or guarding.  Back- no cva tenderness.   Lower ext- calfs symmetric . No pedal edema. Neg homans signs bilaterally.  Anterior thorax-direct costochondral junction tender to palpation and pain reproducible on deep inspiration. At rest with no palpitations or deep inspiration no pain.      Assessment & Plan:  You had recent allergic rhinitis symptoms since past Thursday. Will prescribe Xyzal antihistamine. For associated cough prescription of benzonatate. Offered steroid nasal spray but that was declined today.  Your anterior chest pain by history and exam does support most likely diagnosis of costochondritis. Will give you Toradol 60 mg injection today and tomorrow early afternoon can start low-dose ibuprofen 200-400 mg 3 times daily.  With recent dyspnea since the weekend will get troponin stat. Your EKG looks very similar to prior one which was normal. Some artifact on the v2 lead. Also for the dyspnea on exertion will get a d-dimer stat and included BNP. Also getting chest x-ray. I sent in another refill of your albuterol. If all  studies are negative and dyspnea on exertion persists then advise to try 2 puffs of albuterol and assess impact on your shortness of breath. Give [place] an update on whether or not albuterol help.  I am going to go ahead and make a azithromycin antibiotic available. If you get chest congestion or productive cough then would advise you to start the antibiotic.  Follow-up in 3-5 days or as needed. I will try to call you with stat results. Also please check your my chart account.

## 2017-02-24 NOTE — Patient Instructions (Addendum)
You had recent allergic rhinitis symptoms since past Thursday. Will prescribe Xyzal antihistamine. For associated cough prescription of benzonatate. Offered steroid nasal spray but that was declined today.  Your anterior chest pain by history and exam does support most likely diagnosis of costochondritis. Will give you Toradol 60 mg injection today and tomorrow early afternoon can start low-dose ibuprofen 200-400 mg 3 times daily.  With recent dyspnea since the weekend will get troponin stat. Your EKG looks very similar to prior one which was normal. Some artifact on the v2 lead. Also for the dyspnea on exertion will get a d-dimer stat and included BNP. Also getting chest x-ray. I sent in another refill of your albuterol. If all studies are negative and dyspnea on exertion persists then advise to try 2 puffs of albuterol and assess impact on your shortness of breath. Give [place] an update on whether or not albuterol help.  I am going to go ahead and make a azithromycin antibiotic available. If you get chest congestion or productive cough then would advise you to start the antibiotic.  Follow-up in 3-5 days or as needed. I will try to call you with stat results. Also please check your my chart account.

## 2017-03-06 ENCOUNTER — Ambulatory Visit: Payer: MEDICARE | Admitting: Family Medicine

## 2017-03-11 ENCOUNTER — Ambulatory Visit (INDEPENDENT_AMBULATORY_CARE_PROVIDER_SITE_OTHER): Payer: MEDICARE | Admitting: Family Medicine

## 2017-03-11 ENCOUNTER — Encounter: Payer: Self-pay | Admitting: Family Medicine

## 2017-03-11 DIAGNOSIS — M25511 Pain in right shoulder: Secondary | ICD-10-CM | POA: Diagnosis present

## 2017-03-11 DIAGNOSIS — G8929 Other chronic pain: Secondary | ICD-10-CM

## 2017-03-11 MED ORDER — PREDNISONE 10 MG PO TABS: ORAL_TABLET | ORAL | 0 refills | Status: DC

## 2017-03-11 MED FILL — predniSONE 10 MG TABS: 10 | 6 days supply | Qty: 21 | Fill #0

## 2017-03-11 NOTE — Patient Instructions (Signed)
You're getting some symptoms now related to a pinched nerve in your neck in addition to your shoulder pain. Try prednisone dose pack for 6 days. Call me in a week to let me know how you're doing. Next steps to consider: physical therapy, further imaging (MRI).

## 2017-03-12 NOTE — Progress Notes (Signed)
PCP and consultation requested by: Mackie Pai, PA-C  Subjective:   HPI: Patient is a 71 y.o. male here for right shoulder pain.  8/31: Patient reports he's had about 3-4 months of lateral right shoulder pain. He reports having pain prior to injury where he tripped over dishwasher and landed onto right upper extremity but this worsened his pain. He did physical therapy which helped but he has continued to have pain worse past week. Pain level up to 8/10 and sharp. Feels similar to issue he had with left shoulder that responded to injection. Worse with picking up items. No night pain. No skin changes, numbness.  10/10: Patient reports he feels about the same as last visit. Injection only helped for about 2 days. Pain level 8/10 and sharp. Pain lateral right shoulder but can go into neck the past 2 weeks. Has also reported right arm goes numb sometimes. Gets pain into pinky finger on right when picking something up. No skin changes. No new injuries. No bowel/bladder dysfunction.  Past Medical History:  Diagnosis Date  . Atypical chest pain     Negative cardiac catheterization 2014  . 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 Blind right eye   . 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 Chicken pox   . Chronic back pain   . Dislocation, jaw 1960s  . Edema    Left Leg  . Essential hypertension, benign   . GERD (gastroesophageal reflux disease)   . [place] measles   . Heart attack ([place])   . Hyperlipidemia   . Measles   . Mumps   . Osteopenia 11.12.10   bone density test  . Personal history of other diseases of digestive system 08.10.12   Gastric ulcer    Current Outpatient Prescriptions on File Prior to Visit  Medication Sig Dispense Refill  . albuterol (PROVENTIL HFA;VENTOLIN HFA) 108 (90 Base) MCG/ACT inhaler Inhale 2 puffs into the lungs every 6 (six) hours as needed for wheezing or shortness of breath. 1 Inhaler 0  . aspirin (ASPIR-81) 81 MG EC  tablet Take 1 tablet by mouth daily.    Marland Kitchen azithromycin (ZITHROMAX) 250 MG tablet Take 2 tablets by mouth on day 1, followed by 1 tablet by mouth daily for 4 days. 6 tablet 0  . b complex vitamins tablet Take 1 tablet by mouth daily.    . benzonatate (TESSALON) 100 MG capsule Take 1 capsule (100 mg total) by mouth 2 (two) times daily as needed for cough. 20 capsule 0  . Calcium Carbonate-Vitamin D (CALCIUM-VITAMIN D3 PO) Take by mouth daily.    . fluticasone (FLOVENT HFA) 110 MCG/ACT inhaler Inhale 2 puffs into the lungs 2 (two) times daily at 10 AM and 5 PM. 1 Inhaler 5  . hydrochlorothiazide (MICROZIDE) 12.5 MG capsule Take 1 capsule (12.5 mg total) by mouth daily. 90 capsule 3  . levocetirizine (XYZAL) 5 MG tablet Take 1 tablet (5 mg total) by mouth every evening. 30 tablet 0  . mirabegron ER (MYRBETRIQ) 50 MG TB24 tablet Take 50 mg by mouth daily.    . pantoprazole (PROTONIX) 40 MG tablet Take 1 tablet (40 mg total) by mouth 2 (two) times daily. 180 tablet 3  . simvastatin (ZOCOR) 40 MG tablet Take 1 tablet (40 mg total) by mouth every evening. 90 tablet 3  . tamsulosin (FLOMAX) 0.4 MG CAPS capsule Take 2 capsules (0.8 mg total) by mouth daily. 90 capsule 1  . timolol (TIMOPTIC) 0.5 % ophthalmic solution Place 1  drop into the left eye daily. 10 mL 12  . traMADol (ULTRAM) 50 MG tablet Take 1 tablet (50 mg total) by mouth every 8 (eight) hours as needed. 12 tablet 0  . triamcinolone ointment (KENALOG) 0.5 % Apply 1 application topically 2 (two) times daily. 30 g 0   No current facility-administered medications on file prior to visit.     Past Surgical History:  Procedure Laterality Date  . ABDOMINAL HERNIA REPAIR  1972  . APPENDECTOMY  1973  . CATARACT EXTRACTION, BILATERAL  2005  . CHOLECYSTECTOMY  08/2016  . ESOPHAGOGASTRODUODENOSCOPY  06.2011  . EYE SURGERY  1972   Bilateral Lens Replacement  . FACIAL RECONSTRUCTION SURGERY  2008  . LEFT HEART CATH  02.17.2014  . [place], 2008   Dislocated Jaw  . PARS PLANA VITRECTOMY W/ REPAIR OF MACULAR HOLE  2005   macular hole repair, right eye  . PARS PLANA VITRECTOMY W/ REPAIR OF MACULAR HOLE  2010   mechanical, left eye  . PROSTATE BIOPSY  [place] 2017   w/ Dr. [place] Crane  . WISDOM TOOTH EXTRACTION      Allergies  Allergen Reactions  . Gabapentin Other (See Comments)    Feel "weird"; Nausea; Weakness; Syncope    Social History   Social History  . Marital status: Single    Spouse name: N/A  . Number of children: N/A  . Years of education: N/A   Occupational History  . Not on file.   Social History Main Topics  . Smoking status: Former Smoker    Packs/day: 0.25    Years: 2.00    Types: Cigarettes    Quit date: 06/02/1966  . Smokeless tobacco: Never Used  . Alcohol use No  . Drug use: No  . Sexual activity: No   Other Topics Concern  . Not on file   Social History Narrative  . No narrative on file    Family History  Problem Relation Age of Onset  . Alzheimer's disease Mother        Deceased  . Diabetes Maternal Grandmother   . Heart attack Maternal Grandmother   . Prostate cancer Maternal Uncle   . Diabetes Maternal Uncle   . Healthy Son   . Cataracts Maternal Aunt     BP 119/87   Pulse (!) 56   Ht 5\' 6"  (1.676 m)   Wt 186 lb (84.4 kg)   BMI 30.02 kg/m   Review of Systems: See HPI above.     Objective:  Physical Exam:  Gen: NAD, comfortable in exam room.  Neck: No gross deformity, swelling, bruising. TTP mildly bilateral trapezius muscles.  No midline/bony TTP. FROM. BUE strength 5/5.   Sensation intact to light touch.   2+ equal reflexes in triceps, biceps, brachioradialis tendons. Negative spurlings. NV intact distal BUEs.  Right shoulder: No swelling, ecchymoses.  No gross deformity. No TTP. FROM with painful arc. Positive Hawkins, Neers. Negative Yergasons. Strength 5/5 with empty can and resisted internal/external rotation.  Pain empty can. Negative  apprehension. NV intact distally.  Left shoulder: FROM without pain.   Assessment & Plan:  1. Right shoulder pain - 2/2 rotator cuff impingement - describing some radicular symptoms now though exam is reassuring.  No improvement with subacromial injection, home exercises.  Discussed options - he would like to try prednisone dose pack next.  He will consider physical therapy. He is not interested in doing MRI.

## 2017-03-12 NOTE — Assessment & Plan Note (Signed)
2/2 rotator cuff impingement - describing some radicular symptoms now though exam is reassuring.  No improvement with subacromial injection, home exercises.  Discussed options - he would like to try prednisone dose pack next.  He will consider physical therapy. He is not interested in doing MRI.

## 2017-03-23 ENCOUNTER — Telehealth: Payer: Self-pay | Admitting: Family Medicine

## 2017-03-23 NOTE — Addendum Note (Signed)
Addended by: Sherrie George F on: 03/23/2017 04:19 PM   Modules accepted: Orders

## 2017-03-23 NOTE — Telephone Encounter (Signed)
Spoke to patient and he would like to do physical therapy downstairs. Order sent.

## 2017-03-23 NOTE — Telephone Encounter (Signed)
Patient calling to give an update. States he is still in pain and experiencing circulation problems

## 2017-03-23 NOTE — Telephone Encounter (Signed)
We talked about two things we could do next: physical therapy and MRI - he was pretty adamant about not wanting MRI.

## 2017-03-27 ENCOUNTER — Ambulatory Visit (INDEPENDENT_AMBULATORY_CARE_PROVIDER_SITE_OTHER): Payer: MEDICARE | Admitting: Medical

## 2017-03-27 VITALS — BP 113/71 | HR 54 | Temp 97.9°F | Resp 16 | Ht 66.0 in | Wt 188.2 lb

## 2017-03-27 DIAGNOSIS — M25562 Pain in left knee: Secondary | ICD-10-CM | POA: Diagnosis not present

## 2017-03-27 DIAGNOSIS — R739 Hyperglycemia, unspecified: Secondary | ICD-10-CM

## 2017-03-27 DIAGNOSIS — M25561 Pain in right knee: Secondary | ICD-10-CM

## 2017-03-27 DIAGNOSIS — R350 Frequency of micturition: Secondary | ICD-10-CM | POA: Diagnosis not present

## 2017-03-27 DIAGNOSIS — G8929 Other chronic pain: Secondary | ICD-10-CM | POA: Diagnosis not present

## 2017-03-27 DIAGNOSIS — R109 Unspecified abdominal pain: Secondary | ICD-10-CM | POA: Diagnosis not present

## 2017-03-27 DIAGNOSIS — M25552 Pain in left hip: Secondary | ICD-10-CM

## 2017-03-27 DIAGNOSIS — M25551 Pain in right hip: Secondary | ICD-10-CM

## 2017-03-27 LAB — COMPREHENSIVE METABOLIC PANEL
ALBUMIN: 3.7 g/dL (ref 3.5–5.2)
ALT: 22 U/L (ref 0–53)
AST: 20 U/L (ref 0–37)
Alkaline Phosphatase: 58 U/L (ref 39–117)
BUN: 11 mg/dL (ref 6–23)
CHLORIDE: 102 meq/L (ref 96–112)
CO2: 32 mEq/L (ref 19–32)
Calcium: 9.3 mg/dL (ref 8.4–10.5)
Creatinine, Ser: 1.08 mg/dL (ref 0.40–1.50)
GFR: 86.11 mL/min (ref >60.00)
GLUCOSE: 105 mg/dL — AB (ref 70–99)
POTASSIUM: 4 meq/L (ref 3.5–5.1)
SODIUM: 139 meq/L (ref 135–145)
Total Bilirubin: 1.6 mg/dL — ABNORMAL HIGH (ref 0.2–1.2)
Total Protein: 6.2 g/dL (ref 6.0–8.3)

## 2017-03-27 LAB — CBC WITH DIFFERENTIAL/PLATELET
Basophils Absolute: 0 10*3/uL (ref 0.0–0.1)
Basophils Relative: 0.2 % (ref 0.0–3.0)
EOS ABS: 0.2 10*3/uL (ref 0.0–0.7)
Eosinophils Relative: 4.2 % (ref 0.0–5.0)
HCT: 41.9 % (ref 39.0–52.0)
HEMOGLOBIN: 13.4 g/dL (ref 13.0–17.0)
Lymphocytes Relative: 36.4 % (ref 12.0–46.0)
Lymphs Abs: 1.7 10*3/uL (ref 0.7–4.0)
MCHC: 32 g/dL (ref 30.0–36.0)
MCV: 88.1 fl (ref 78.0–100.0)
MONO ABS: 0.6 10*3/uL (ref 0.1–1.0)
Monocytes Relative: 11.7 % (ref 3.0–12.0)
NEUTROS PCT: 47.5 % (ref 43.0–77.0)
Neutro Abs: 2.3 10*3/uL (ref 1.4–7.7)
Platelets: 275 10*3/uL (ref 150.0–400.0)
RBC: 4.76 Mil/uL (ref 4.22–5.81)
RDW: 13.7 % (ref 11.5–15.5)
WBC: 4.8 10*3/uL (ref 4.0–10.5)

## 2017-03-27 LAB — POC URINALSYSI DIPSTICK (AUTOMATED)
Bilirubin, UA: NEGATIVE
Blood, UA: NEGATIVE
Glucose, UA: NEGATIVE
Ketones, UA: NEGATIVE
LEUKOCYTES UA: NEGATIVE
NITRITE UA: NEGATIVE
PH UA: 6 (ref 5.0–8.0)
PROTEIN UA: NEGATIVE
Spec Grav, UA: 1.025 (ref 1.010–1.025)
UROBILINOGEN UA: NEGATIVE U/dL — AB

## 2017-03-27 LAB — LIPASE: Lipase: 18 U/L (ref 11.0–59.0)

## 2017-03-27 LAB — URIC ACID: URIC ACID, SERUM: 5 mg/dL (ref 4.0–7.8)

## 2017-03-27 LAB — PSA: PSA: 0.85 ng/mL (ref 0.10–4.00)

## 2017-03-27 LAB — AMYLASE: AMYLASE: 43 U/L (ref 27–131)

## 2017-03-27 LAB — HEMOGLOBIN A1C: HEMOGLOBIN A1C: 5.8 % (ref 4.6–6.5)

## 2017-03-27 LAB — SEDIMENTATION RATE: SED RATE: 11 mm/h (ref 0–20)

## 2017-03-27 MED ORDER — TRAMADOL HCL 50 MG PO TABS: 50.0000 mg | ORAL_TABLET | Freq: Four times a day (QID) | ORAL | 0 refills | Status: DC | PRN

## 2017-03-27 MED ORDER — GI COCKTAIL ~~LOC~~
30.0000 mL | Freq: Once | ORAL | Status: AC
  Administered 2017-03-27: 30 mL via ORAL

## 2017-03-27 MED ORDER — RANITIDINE HCL 150 MG PO CAPS: 150.0000 mg | ORAL_CAPSULE | Freq: Two times a day (BID) | ORAL | 0 refills | Status: DC

## 2017-03-27 MED ORDER — KETOROLAC TROMETHAMINE 60 MG/2ML IM SOLN
60.0000 mg | Freq: Once | INTRAMUSCULAR | Status: AC
  Administered 2017-03-27: 60 mg via INTRAMUSCULAR

## 2017-03-27 MED ORDER — HYDROCHLOROTHIAZIDE 12.5 MG PO CAPS: 12.5000 mg | ORAL_CAPSULE | Freq: Every day | ORAL | 3 refills | Status: DC

## 2017-03-27 MED FILL — traMADol HCL 50 MG TABS: 50 | 5 days supply | Qty: 20 | Fill #0

## 2017-03-27 NOTE — Progress Notes (Signed)
Subjective:    Patient ID: Brian [name], male    DOB: 03-Feb-1946, 71 y.o.   MRN: [id number]  HPI  Pt in today stating he has some abdomen pain at rest without eating. But at times when he eats he feels like his abdomen expands and feels little bloated. Abdomen pain over last week. Pt states he noted abdomen pain seemed to occur with onset of prednisone that sports med gave him. Pt denies any black or bloody stools. Pt abdomen discomfort about 7/10. Pt at times when eats bread or rise it feels like sticks in his throat. Pt had egd in the past year. Benign polps and Gi told to take protonix.  Pt denies any constipation. He has bowel movements 2-3 times a day but no diarrhea. Pt due to colonosocpy per his report in 2019. (pt had gallbladder and appendix removed in the past)  Pt has been urinating more in last couple of days. Pt does note recent prednisone use. Hx of mild sugar elevation in past. He states he wanted to eat a lot of sweets when was on prednisone. Faint transient dizziness yesterday. Occurred when he stood up real quick and then resolved after he sat down quickly.  Pt states recent with cold weather and rain both shoulder pain, knee pain and ankle pains.    Review of Systems  Constitutional: Negative for chills, fatigue and fever.  HENT: Negative for congestion and drooling.   Respiratory: Negative for chest tightness, shortness of breath and wheezing.   Cardiovascular: Negative for chest pain and palpitations.  Gastrointestinal: Negative for abdominal pain.  Musculoskeletal: Negative for back pain.  Neurological: Negative for dizziness, seizures, weakness and headaches.       Clarification on his statement to ma about feeling like passing out. He stated felt real dizzy when he was taking prednisone. Not on presently and not reported and reported no syncope or near syncope to me.  Hematological: Negative for adenopathy. Does not bruise/bleed easily.  Psychiatric/Behavioral:  Negative for behavioral problems, confusion, dysphoric mood and sleep disturbance. The patient is not nervous/anxious.     Past Medical History:  Diagnosis Date  . Atypical chest pain     Negative cardiac catheterization 2014  . 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 Blind right eye   . 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 Chicken pox   . Chronic back pain   . Dislocation, jaw 1960s  . Edema    Left Leg  . Essential hypertension, benign   . GERD (gastroesophageal reflux disease)   . [place] measles   . Heart attack ([place])   . Hyperlipidemia   . Measles   . Mumps   . Osteopenia 11.12.10   bone density test  . Personal history of other diseases of digestive system 08.10.12   Gastric ulcer     Social History   Social History  . Marital status: Single    Spouse name: N/A  . Number of children: N/A  . Years of education: N/A   Occupational History  . Not on file.   Social History Main Topics  . Smoking status: Former Smoker    Packs/day: 0.25    Years: 2.00    Types: Cigarettes    Quit date: 06/02/1966  . Smokeless tobacco: Never Used  . Alcohol use No  . Drug use: No  . Sexual activity: No   Other Topics Concern  . Not on file   Social History Narrative  . No  narrative on file    Past Surgical History:  Procedure Laterality Date  . ABDOMINAL HERNIA REPAIR  1972  . APPENDECTOMY  1973  . CATARACT EXTRACTION, BILATERAL  2005  . CHOLECYSTECTOMY  08/2016  . ESOPHAGOGASTRODUODENOSCOPY  06.2011  . EYE SURGERY  1972   Bilateral Lens Replacement  . FACIAL RECONSTRUCTION SURGERY  2008  . LEFT HEART CATH  02.17.2014  . [place], 2008   Dislocated Jaw  . PARS PLANA VITRECTOMY W/ REPAIR OF MACULAR HOLE  2005   macular hole repair, right eye  . PARS PLANA VITRECTOMY W/ REPAIR OF MACULAR HOLE  2010   mechanical, left eye  . PROSTATE BIOPSY  [place] 2017   w/ Dr. [place] Crane  . WISDOM TOOTH EXTRACTION      Family History  Problem Relation  Age of Onset  . Alzheimer's disease Mother        Deceased  . Diabetes Maternal Grandmother   . Heart attack Maternal Grandmother   . Prostate cancer Maternal Uncle   . Diabetes Maternal Uncle   . Healthy Son   . Cataracts Maternal Aunt     Allergies  Allergen Reactions  . Gabapentin Other (See Comments)    Feel "weird"; Nausea; Weakness; Syncope    Current Outpatient Prescriptions on File Prior to Visit  Medication Sig Dispense Refill  . albuterol (PROVENTIL HFA;VENTOLIN HFA) 108 (90 Base) MCG/ACT inhaler Inhale 2 puffs into the lungs every 6 (six) hours as needed for wheezing or shortness of breath. 1 Inhaler 0  . aspirin (ASPIR-81) 81 MG EC tablet Take 1 tablet by mouth daily.    Marland Kitchen azithromycin (ZITHROMAX) 250 MG tablet Take 2 tablets by mouth on day 1, followed by 1 tablet by mouth daily for 4 days. 6 tablet 0  . b complex vitamins tablet Take 1 tablet by mouth daily.    . benzonatate (TESSALON) 100 MG capsule Take 1 capsule (100 mg total) by mouth 2 (two) times daily as needed for cough. 20 capsule 0  . Calcium Carbonate-Vitamin D (CALCIUM-VITAMIN D3 PO) Take by mouth daily.    . fluticasone (FLOVENT HFA) 110 MCG/ACT inhaler Inhale 2 puffs into the lungs 2 (two) times daily at 10 AM and 5 PM. 1 Inhaler 5  . hydrochlorothiazide (MICROZIDE) 12.5 MG capsule Take 1 capsule (12.5 mg total) by mouth daily. 90 capsule 3  . levocetirizine (XYZAL) 5 MG tablet Take 1 tablet (5 mg total) by mouth every evening. 30 tablet 0  . mirabegron ER (MYRBETRIQ) 50 MG TB24 tablet Take 50 mg by mouth daily.    . pantoprazole (PROTONIX) 40 MG tablet Take 1 tablet (40 mg total) by mouth 2 (two) times daily. 180 tablet 3  . predniSONE (DELTASONE) 10 MG tablet 6 tabs po day 1, 5 tabs po day 2, 4 tabs po day 3, 3 tabs po day 4, 2 tabs po day 5, 1 tab po day 6 21 tablet 0  . simvastatin (ZOCOR) 40 MG tablet Take 1 tablet (40 mg total) by mouth every evening. 90 tablet 3  . tamsulosin (FLOMAX) 0.4 MG CAPS  capsule Take 2 capsules (0.8 mg total) by mouth daily. 90 capsule 1  . timolol (TIMOPTIC) 0.5 % ophthalmic solution Place 1 drop into the left eye daily. 10 mL 12  . traMADol (ULTRAM) 50 MG tablet Take 1 tablet (50 mg total) by mouth every 8 (eight) hours as needed. 12 tablet 0  . triamcinolone ointment (KENALOG) 0.5 % Apply 1  application topically 2 (two) times daily. 30 g 0   No current facility-administered medications on file prior to visit.     BP 113/71   Pulse (!) 54   Temp 97.9 F (36.6 C) (Oral)   Resp 16   Ht 5\' 6"  (1.676 m)   Wt 188 lb 3.2 oz (85.4 kg)   SpO2 100%   BMI 30.38 kg/m       Objective:   Physical Exam  General Appearance- Not in acute distress.(does not look to be in any severe pain)  HEENT Eyes- Scleraeral/Conjuntiva-bilat- Not Yellow. Mouth & Throat- Normal.  Chest and Lung Exam Auscultation: Breath sounds:-Normal. Adventitious sounds:- No Adventitious sounds.  Cardiovascular Auscultation:Rythm - Regular. Heart Sounds -Normal heart sounds.  Abdomen Inspection:-Inspection Normal.  Palpation/Perucssion: Palpation and Percussion of the abdomen reveal- mild epigastric to palpation and moderate pain rt side abdomen mid way point between umbilicus and rt lower quadrant, No Rebound tenderness, No rigidity(Guarding) and No Palpable abdominal masses.  Liver:-Normal.  Spleen:- Normal.  (pt states after 15 minutes stomach is beginning to feel better. 4-5/10 level pain)  Back- faint rt cva tenderness.  Skin- on inspection no rash or vesicles.  Shoulder, knees and ankles- mild pain on palpaitn and range of motion.   Neurologic  Cranial Nerve exam:- CN III-XII intact(No nystagmus), symmetric smile. Strength:- 5/5 equal and symmetric strength both upper and lower extremities.    Assessment & Plan:  For abdomen pain please get labs listed. We gave you GI cocktail in office. Continue protonix. You could add ranitidine as well.   For frequent  urination will get ua, psa and a1c(due to mild high sugar hx and recent use of prednisone).  For arthralgias we gave toradol 60 mg im. Refill prior tramadol. Avoid nsaids preently due recent abdomen pain and epigastric pain.  I decided since you abdomen pain decreased with GI cocktail to hold off(on imaging studies) and follow you lab results. When we call later with lab results may decide to do imaging studies such as CT if you report increase of abdomen pain again. Any severe or changing symptoms over weekend then ED evaluation.  Follow up in 5-7 days or as needed  Brian [name], Brian [name], [employer]

## 2017-03-27 NOTE — Patient Instructions (Addendum)
For abdomen pain please get labs listed. We gave you GI cocktail in office. Continue protonix. You could add ranitidine as well.   For frequent urination will get ua, psa and a1c(due to mild high sugar hx and recent use of prednisone).Urine looked clear so decided not to do culture.  For arthralgias we gave toradol 60 mg im. Refill prior tramadol. Avoid nsaids preently due recent abdomen pain and epigastric pain.  I decided since your abdomen pain decreased with GI cocktail to hold off and follow you lab results. When we call later with lab results may decide to do imaging studies such as CT if you report increase of abdomen pain again. Any severe or changing symptoms over weekend then ED evaluation.  Follow up in 5-7 days or as needed

## 2017-04-06 ENCOUNTER — Ambulatory Visit: Payer: MEDICARE | Admitting: Physical Therapy

## 2017-04-14 ENCOUNTER — Encounter: Payer: Self-pay | Admitting: Podiatry

## 2017-04-14 ENCOUNTER — Ambulatory Visit (INDEPENDENT_AMBULATORY_CARE_PROVIDER_SITE_OTHER): Payer: MEDICARE | Admitting: Podiatry

## 2017-04-14 DIAGNOSIS — Q828 Other specified congenital malformations of skin: Secondary | ICD-10-CM

## 2017-04-14 DIAGNOSIS — B351 Tinea unguium: Secondary | ICD-10-CM | POA: Diagnosis not present

## 2017-04-14 DIAGNOSIS — M79671 Pain in right foot: Secondary | ICD-10-CM

## 2017-04-14 NOTE — Progress Notes (Signed)
Subjective:    Patient ID: Brian [name], male   DOB: 71 y.o.   MRN: [id number]   HPI 71 year old male presents the office today for concerns of right foot pain on the callus near the fifth toe.  He states it hurts with pressure in shoes.  Denies any swelling or redness or any drainage coming from the area. He has previously seen Dr. Joya Gaskins in 2017 and Dr. Caffie Pinto in 2014 for the same issue.    Review of Systems  All other systems reviewed and are negative.  Past Medical History:  Diagnosis Date  . Atypical chest pain     Negative cardiac catheterization 2014  . 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 Blind right eye   . 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 Chicken pox   . Chronic back pain   . Dislocation, jaw 1960s  . Edema    Left Leg  . Essential hypertension, benign   . GERD (gastroesophageal reflux disease)   . [place] measles   . Heart attack ([place])   . Hyperlipidemia   . Measles   . Mumps   . Osteopenia 11.12.10   bone density test  . Personal history of other diseases of digestive system 08.10.12   Gastric ulcer    Past Surgical History:  Procedure Laterality Date  . ABDOMINAL HERNIA REPAIR  1972  . APPENDECTOMY  1973  . CATARACT EXTRACTION, BILATERAL  2005  . CHOLECYSTECTOMY  08/2016  . ESOPHAGOGASTRODUODENOSCOPY  06.2011  . EYE SURGERY  1972   Bilateral Lens Replacement  . FACIAL RECONSTRUCTION SURGERY  2008  . LEFT HEART CATH  02.17.2014  . [place], 2008   Dislocated Jaw  . PARS PLANA VITRECTOMY W/ REPAIR OF MACULAR HOLE  2005   macular hole repair, right eye  . PARS PLANA VITRECTOMY W/ REPAIR OF MACULAR HOLE  2010   mechanical, left eye  . PROSTATE BIOPSY  [place] 2017   w/ Dr. [place] Crane  . WISDOM TOOTH EXTRACTION       Current Outpatient Medications:  .  albuterol (PROVENTIL HFA;VENTOLIN HFA) 108 (90 Base) MCG/ACT inhaler, Inhale 2 puffs into the lungs every 6 (six) hours as needed for wheezing or shortness of breath.,  Disp: 1 Inhaler, Rfl: 0 .  aspirin (ASPIR-81) 81 MG EC tablet, Take 1 tablet by mouth daily., Disp: , Rfl:  .  azithromycin (ZITHROMAX) 250 MG tablet, Take 2 tablets by mouth on day 1, followed by 1 tablet by mouth daily for 4 days., Disp: 6 tablet, Rfl: 0 .  b complex vitamins tablet, Take 1 tablet by mouth daily., Disp: , Rfl:  .  benzonatate (TESSALON) 100 MG capsule, Take 1 capsule (100 mg total) by mouth 2 (two) times daily as needed for cough., Disp: 20 capsule, Rfl: 0 .  Calcium Carbonate-Vitamin D (CALCIUM-VITAMIN D3 PO), Take by mouth daily., Disp: , Rfl:  .  fluticasone (FLOVENT HFA) 110 MCG/ACT inhaler, Inhale 2 puffs into the lungs 2 (two) times daily at 10 AM and 5 PM., Disp: 1 Inhaler, Rfl: 5 .  hydrochlorothiazide (MICROZIDE) 12.5 MG capsule, Take 1 capsule (12.5 mg total) by mouth daily., Disp: 90 capsule, Rfl: 3 .  levocetirizine (XYZAL) 5 MG tablet, Take 1 tablet (5 mg total) by mouth every evening., Disp: 30 tablet, Rfl: 0 .  mirabegron ER (MYRBETRIQ) 50 MG TB24 tablet, Take 50 mg by mouth daily., Disp: , Rfl:  .  pantoprazole (PROTONIX) 40 MG tablet, Take 1 tablet (  40 mg total) by mouth 2 (two) times daily., Disp: 180 tablet, Rfl: 3 .  predniSONE (DELTASONE) 10 MG tablet, 6 tabs po day 1, 5 tabs po day 2, 4 tabs po day 3, 3 tabs po day 4, 2 tabs po day 5, 1 tab po day 6, Disp: 21 tablet, Rfl: 0 .  ranitidine (ZANTAC) 150 MG capsule, Take 1 capsule (150 mg total) by mouth 2 (two) times daily., Disp: 60 capsule, Rfl: 0 .  simvastatin (ZOCOR) 40 MG tablet, Take 1 tablet (40 mg total) by mouth every evening., Disp: 90 tablet, Rfl: 3 .  tamsulosin (FLOMAX) 0.4 MG CAPS capsule, Take 2 capsules (0.8 mg total) by mouth daily., Disp: 90 capsule, Rfl: 1 .  timolol (TIMOPTIC) 0.5 % ophthalmic solution, Place 1 drop into the left eye daily., Disp: 10 mL, Rfl: 12 .  traMADol (ULTRAM) 50 MG tablet, Take 1 tablet (50 mg total) by mouth every 6 (six) hours as needed., Disp: 20 tablet, Rfl: 0 .   triamcinolone ointment (KENALOG) 0.5 %, Apply 1 application topically 2 (two) times daily., Disp: 30 g, Rfl: 0  Allergies  Allergen Reactions  . Gabapentin Other (See Comments)    Feel "weird"; Nausea; Weakness; Syncope    Social History   Socioeconomic History  . Marital status: Single    Spouse name: Not on file  . Number of children: Not on file  . Years of education: Not on file  . Highest education level: Not on file  Social Needs  . Financial resource strain: Not on file  . Food insecurity - worry: Not on file  . Food insecurity - inability: Not on file  . Transportation needs - medical: Not on file  . Transportation needs - non-medical: Not on file  Occupational History  . Not on file  Tobacco Use  . Smoking status: Former Smoker    Packs/day: 0.25    Years: 2.00    Pack years: 0.50    Types: Cigarettes    Last attempt to quit: 06/02/1966    Years since quitting: 50.9  . Smokeless tobacco: Never Used  Substance and Sexual Activity  . Alcohol use: No    Alcohol/week: 0.0 oz  . Drug use: No  . Sexual activity: No  Other Topics Concern  . Not on file  Social History Narrative  . Not on file         Objective:  Physical Exam General: AAO x3, NAD  Dermatological: Reveals a somewhat mild yellow to brown discoloration of there is no pain at the nails there is no surrounding redness or drainage there is no significant hypertrophy.  On the right foot third metatarsal 5 the deep annular porokeratosis, hyperkeratotic lesion with tenderness to palpation.  Upon debridement there was no underlying ulceration, drainage or any signs of infection there is no evidence of foreign body.  No other open lesions or pre-ulcerative lesions are identified today.  Vascular: Dorsalis Pedis artery and Posterior Tibial artery pedal pulses are 2/4 bilateral with immedate capillary fill time.  There is no pain with calf compression, swelling, warmth, erythema.   Neruologic: Grossly intact  via light touch bilateral.  Protective threshold with Semmes Wienstein monofilament intact to all pedal sites bilateral.   Musculoskeletal: Tenderness of the hyperkeratotic lesion right foot but no other areas of tenderness identified.  No gross boney pedal deformities bilateral. No pain, crepitus, or limitation noted with foot and ankle range of motion bilateral. Muscular strength 5/5 in all groups  tested bilateral.      Assessment:     Porokeratosis right foot; chronic mycosis    Plan:  -Treatment options discussed including all alternatives, risks, and complications -Etiology of symptoms were discussed -Sharply debrided the hyperkeratotic lesion to the right foot without any complications or bleeding today.  Dispensed offloading pads as he states he has not been trying this.  Also discussed some accommodative soft insert for shoes but we will hold off on that for now. -Discussed treatment options for nail fungus.  Will start with over-the-counter Fungi-Nail. -Follow-up at 3 monthsor sooner if any issues arise  Trula Slade DPM

## 2017-04-14 NOTE — Patient Instructions (Signed)
Can try Fungi-Nail which he can purchase at the drugstore

## 2017-05-19 ENCOUNTER — Ambulatory Visit (HOSPITAL_BASED_OUTPATIENT_CLINIC_OR_DEPARTMENT_OTHER)
Admission: RE | Admit: 2017-05-19 | Discharge: 2017-05-19 | Disposition: A | Discharge status: Home / Self Care | Payer: MEDICARE | Source: Ambulatory Visit | Attending: Medical | Admitting: Medical

## 2017-05-19 ENCOUNTER — Encounter: Payer: Self-pay | Admitting: Medical

## 2017-05-19 ENCOUNTER — Other Ambulatory Visit: Payer: Self-pay | Admitting: Medical

## 2017-05-19 ENCOUNTER — Ambulatory Visit (INDEPENDENT_AMBULATORY_CARE_PROVIDER_SITE_OTHER): Payer: MEDICARE | Admitting: Medical

## 2017-05-19 ENCOUNTER — Ambulatory Visit: Payer: MEDICARE

## 2017-05-19 VITALS — BP 124/72 | HR 52 | Temp 97.7°F | Resp 16 | Ht 66.0 in | Wt 192.4 lb

## 2017-05-19 DIAGNOSIS — R079 Chest pain, unspecified: Secondary | ICD-10-CM | POA: Diagnosis not present

## 2017-05-19 DIAGNOSIS — E785 Hyperlipidemia, unspecified: Secondary | ICD-10-CM

## 2017-05-19 DIAGNOSIS — M79641 Pain in right hand: Secondary | ICD-10-CM

## 2017-05-19 DIAGNOSIS — M25512 Pain in left shoulder: Secondary | ICD-10-CM

## 2017-05-19 DIAGNOSIS — M25572 Pain in left ankle and joints of left foot: Secondary | ICD-10-CM | POA: Insufficient documentation

## 2017-05-19 DIAGNOSIS — M545 Low back pain, unspecified: Secondary | ICD-10-CM

## 2017-05-19 DIAGNOSIS — G8929 Other chronic pain: Secondary | ICD-10-CM

## 2017-05-19 DIAGNOSIS — M25511 Pain in right shoulder: Secondary | ICD-10-CM | POA: Diagnosis not present

## 2017-05-19 LAB — COMPREHENSIVE METABOLIC PANEL
ALT: 17 U/L (ref 0–53)
AST: 18 U/L (ref 0–37)
Albumin: 3.8 g/dL (ref 3.5–5.2)
Alkaline Phosphatase: 53 U/L (ref 39–117)
BUN: 13 mg/dL (ref 6–23)
CHLORIDE: 102 meq/L (ref 96–112)
CO2: 32 mEq/L (ref 19–32)
CREATININE: 1.02 mg/dL (ref 0.40–1.50)
Calcium: 9 mg/dL (ref 8.4–10.5)
GFR: 91.95 mL/min (ref >60.00)
GLUCOSE: 99 mg/dL (ref 70–99)
POTASSIUM: 3.8 meq/L (ref 3.5–5.1)
SODIUM: 139 meq/L (ref 135–145)
TOTAL PROTEIN: 6.8 g/dL (ref 6.0–8.3)
Total Bilirubin: 1.1 mg/dL (ref 0.2–1.2)

## 2017-05-19 MED ORDER — SIMVASTATIN 40 MG PO TABS: 40.0000 mg | ORAL_TABLET | Freq: Every evening | ORAL | 3 refills | Status: DC

## 2017-05-19 MED ORDER — HYDROCHLOROTHIAZIDE 12.5 MG PO CAPS: 12.5000 mg | ORAL_CAPSULE | Freq: Every day | ORAL | 3 refills | Status: DC

## 2017-05-19 MED ORDER — PANTOPRAZOLE SODIUM 40 MG PO TBEC: 40.0000 mg | DELAYED_RELEASE_TABLET | Freq: Two times a day (BID) | ORAL | 3 refills | Status: DC

## 2017-05-19 NOTE — Patient Instructions (Signed)
For your new areas of pain left ankle and right hand, we would do x-rays of both.  For your areas shoulder pain from rotator cuff injuries and chronic low back pain, I am not doing further workup presently.  You expressed reluctance to use medication for pain but I do think alternating Tylenol and ibuprofen is reasonable option for mild to moderate pain.  For moderate-to-severe pain can use tramadol.   Your EKG showed sinus bradycardia.  With no acute ischemia type changes.  EKG compared to prior looked virtually identical.  Your pain the other day was brief lasting 1-2 seconds.  This appears more in line with musculoskeletal type pain.  If any chest pain with worse features as discussed then emergency department evaluation.  Please get lipid panel and metabolic panel today to evaluate cholesterol, kidney function and liver enzymes  Follow-up in 3 months or as needed.

## 2017-05-19 NOTE — Progress Notes (Signed)
Subjective:    Patient ID: Brian [name], male    DOB: 1946-04-20, 71 y.o.   MRN: [id number]  HPI  Pt in with various areas of pain.   He mentions shoulder pain from known rotator both sides. Some low back pain as well.  Also some left ankle pain.  Pt aware or rotator cuff issues. Also Back on and and off chronic lumbar pain.  Xray lumbar spine.. IMPRESSION: 1. No fracture or acute malalignment in the lumbar spine . 2. Minimal progression of mild multilevel degenerative disc disease in the lumbar spine. 3. Stable 4 mm anterolisthesis at L4-5.  Pt ankle pain hurting 1-2 weeks. No injury just started.   Rt hand pain that just started. Just woke up and noted pain. Points to 5th metacarpal.  Pt expresses hesitancy to use medication for pain. In past I had written him tramadol.   Of all areas lower back pain and rt hand pain is worse.     Last week he had brief pain mid chest and left lower rib for a second when leaning over. Then Sunday occurred again brief and transient for 2 seconds only.    Review of Systems  Constitutional: Negative for chills, fatigue and fever.  Respiratory: Negative for cough, chest tightness, shortness of breath and wheezing.   Cardiovascular: Negative for chest pain and palpitations.  Gastrointestinal: Negative for abdominal pain, constipation, rectal pain and vomiting.  Musculoskeletal: Positive for back pain. Negative for joint swelling, myalgias, neck pain and neck stiffness.  Skin: Negative for pallor and rash.  Neurological: Negative for dizziness, seizures, weakness, numbness and headaches.  Hematological: Negative for adenopathy. Does not bruise/bleed easily.  Psychiatric/Behavioral: Negative for behavioral problems, confusion and sleep disturbance. The patient is not nervous/anxious.    Past Medical History:  Diagnosis Date  . Atypical chest pain     Negative cardiac catheterization 2014  . 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 Blind right  eye   . 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 Chicken pox   . Chronic back pain   . Dislocation, jaw 1960s  . Edema    Left Leg  . Essential hypertension, benign   . GERD (gastroesophageal reflux disease)   . [place] measles   . Heart attack ([place])   . Hyperlipidemia   . Measles   . Mumps   . Osteopenia 11.12.10   bone density test  . Personal history of other diseases of digestive system 08.10.12   Gastric ulcer     Social History   Socioeconomic History  . Marital status: Single    Spouse name: Not on file  . Number of children: Not on file  . Years of education: Not on file  . Highest education level: Not on file  Social Needs  . Financial resource strain: Not on file  . Food insecurity - worry: Not on file  . Food insecurity - inability: Not on file  . Transportation needs - medical: Not on file  . Transportation needs - non-medical: Not on file  Occupational History  . Not on file  Tobacco Use  . Smoking status: Former Smoker    Packs/day: 0.25    Years: 2.00    Pack years: 0.50    Types: Cigarettes    Last attempt to quit: 06/02/1966    Years since quitting: 50.9  . Smokeless tobacco: Never Used  Substance and Sexual Activity  . Alcohol use: No    Alcohol/week: 0.0 oz  .  Drug use: No  . Sexual activity: No  Other Topics Concern  . Not on file  Social History Narrative  . Not on file    Past Surgical History:  Procedure Laterality Date  . ABDOMINAL HERNIA REPAIR  1972  . APPENDECTOMY  1973  . CATARACT EXTRACTION, BILATERAL  2005  . CHOLECYSTECTOMY  08/2016  . ESOPHAGOGASTRODUODENOSCOPY  06.2011  . EYE SURGERY  1972   Bilateral Lens Replacement  . FACIAL RECONSTRUCTION SURGERY  2008  . LEFT HEART CATH  02.17.2014  . [place], 2008   Dislocated Jaw  . PARS PLANA VITRECTOMY W/ REPAIR OF MACULAR HOLE  2005   macular hole repair, right eye  . PARS PLANA VITRECTOMY W/ REPAIR OF MACULAR HOLE  2010   mechanical, left  eye  . PROSTATE BIOPSY  [place] 2017   w/ Dr. Nevada Crane  . WISDOM TOOTH EXTRACTION      Family History  Problem Relation Age of Onset  . Alzheimer's disease Mother        Deceased  . Diabetes Maternal Grandmother   . Heart attack Maternal Grandmother   . Prostate cancer Maternal Uncle   . Diabetes Maternal Uncle   . Healthy Son   . Cataracts Maternal Aunt     Allergies  Allergen Reactions  . Gabapentin Other (See Comments)    Feel "weird"; Nausea; Weakness; Syncope    Current Outpatient Medications on File Prior to Visit  Medication Sig Dispense Refill  . aspirin (ASPIR-81) 81 MG EC tablet Take 1 tablet by mouth daily.    Marland Kitchen b complex vitamins tablet Take 1 tablet by mouth daily.    . Calcium Carbonate-Vitamin D (CALCIUM-VITAMIN D3 PO) Take by mouth daily.    . hydrochlorothiazide (MICROZIDE) 12.5 MG capsule Take 1 capsule (12.5 mg total) by mouth daily. 90 capsule 3  . mirabegron ER (MYRBETRIQ) 50 MG TB24 tablet Take 50 mg by mouth daily.    . pantoprazole (PROTONIX) 40 MG tablet Take 1 tablet (40 mg total) by mouth 2 (two) times daily. 180 tablet 3  . predniSONE (DELTASONE) 10 MG tablet 6 tabs po day 1, 5 tabs po day 2, 4 tabs po day 3, 3 tabs po day 4, 2 tabs po day 5, 1 tab po day 6 21 tablet 0  . ranitidine (ZANTAC) 150 MG capsule Take 1 capsule (150 mg total) by mouth 2 (two) times daily. 60 capsule 0  . simvastatin (ZOCOR) 40 MG tablet Take 1 tablet (40 mg total) by mouth every evening. 90 tablet 3  . tamsulosin (FLOMAX) 0.4 MG CAPS capsule Take 2 capsules (0.8 mg total) by mouth daily. 90 capsule 1  . timolol (TIMOPTIC) 0.5 % ophthalmic solution Place 1 drop into the left eye daily. 10 mL 12  . traMADol (ULTRAM) 50 MG tablet Take 1 tablet (50 mg total) by mouth every 6 (six) hours as needed. 20 tablet 0  . triamcinolone ointment (KENALOG) 0.5 % Apply 1 application topically 2 (two) times daily. 30 g 0   No current facility-administered medications on file prior to visit.      BP 124/72   Pulse (!) 52   Temp 97.7 F (36.5 C) (Oral)   Resp 16   Ht 5\' 6"  (1.676 m)   Wt 192 lb 6.4 oz (87.3 kg)   SpO2 99%   BMI 31.05 kg/m       Objective:   Physical Exam  General Appearance- Not in acute distress.  Chest and Lung Exam Auscultation: Breath sounds:-Normal. Clear even and unlabored. Adventitious sounds:- No Adventitious sounds.  Cardiovascular Auscultation:Rythm - Regular, rate and rythm. Heart Sounds -Normal heart sounds.  Abdomen Inspection:-Inspection Normal.  Palpation/Perucssion: Palpation and Percussion of the abdomen reveal- Non Tender, No Rebound tenderness, No rigidity(Guarding) and No Palpable abdominal masses.  Liver:-Normal.  Spleen:- Normal.   Back Mid lumbar spine tenderness to palpation. Pain on straight leg lift. Pain on lateral movements and flexion/extension of the spine.  Lower ext neurologic  L5-S1 sensation intact bilaterally. Normal patellar reflexes bilaterally. No foot drop bilaterally.  Shoulders- moderate-severe pain on abduction of both upper ext.  Rt hand- 5th metacarpal area pain on palpation. No warmth or swelling.  Left ankle- mild talofibular tenderness.       Assessment & Plan:  For your new areas of pain left ankle and right hand, we would do x-rays of both.  For your areas shoulder pain from rotator cuff injuries and chronic low back pain, I am not doing further workup presently.  You expressed reluctance to use medication for pain but I do think alternating Tylenol and ibuprofen is reasonable option for mild to moderate pain.  For moderate-to-severe pain can use tramadol.   Your EKG showed sinus bradycardia.  With no acute ischemia type changes.  EKG compared to prior looked virtually identical.  Your pain the other day was brief lasting 1-2 seconds.  This appears more in line with musculoskeletal type pain.  If any chest pain with worse features as discussed then emergency department  evaluation.  Please get lipid panel and metabolic panel today to evaluate cholesterol, kidney function and liver enzymes  Follow-up in 3 months or as needed.  Fahmida Jurich, Percell Miller, PA-C

## 2017-05-20 ENCOUNTER — Encounter: Payer: Self-pay | Admitting: Medical

## 2017-05-20 ENCOUNTER — Telehealth: Payer: Self-pay | Admitting: *Deleted

## 2017-05-20 ENCOUNTER — Other Ambulatory Visit (INDEPENDENT_AMBULATORY_CARE_PROVIDER_SITE_OTHER): Payer: MEDICARE

## 2017-05-20 DIAGNOSIS — E785 Hyperlipidemia, unspecified: Secondary | ICD-10-CM | POA: Diagnosis not present

## 2017-05-20 LAB — LIPID PANEL
CHOLESTEROL: 203 mg/dL — AB (ref 0–200)
HDL: 55.2 mg/dL (ref >39.00)
LDL CALC: 122 mg/dL — AB (ref 0–99)
NonHDL: 147.83
TRIGLYCERIDES: 130 mg/dL (ref 0.0–149.0)
Total CHOL/HDL Ratio: 4
VLDL: 26 mg/dL (ref 0.0–40.0)

## 2017-05-20 NOTE — Telephone Encounter (Signed)
Added Lipid to recent blood work.  Confirmation received.//AB/CMA

## 2017-05-20 NOTE — Telephone Encounter (Signed)
-----   Message from Mackie Pai, PA-C sent at 05/19/2017  6:30 PM EST ----- I did a metabolic panel on him yesterday.  But I wanted to order a lipid panel as well.  Looks like I forgot to order that.  Can you add that to the lab done yesterday.

## 2017-06-05 ENCOUNTER — Ambulatory Visit: Payer: MEDICARE | Admitting: Medical

## 2017-06-09 ENCOUNTER — Ambulatory Visit (INDEPENDENT_AMBULATORY_CARE_PROVIDER_SITE_OTHER): Payer: MEDICARE | Admitting: Medical

## 2017-06-09 ENCOUNTER — Encounter: Payer: Self-pay | Admitting: Medical

## 2017-06-09 VITALS — BP 122/71 | HR 50 | Temp 97.6°F | Resp 16 | Ht 66.0 in | Wt 187.4 lb

## 2017-06-09 DIAGNOSIS — R140 Abdominal distension (gaseous): Secondary | ICD-10-CM | POA: Diagnosis not present

## 2017-06-09 DIAGNOSIS — R110 Nausea: Secondary | ICD-10-CM

## 2017-06-09 DIAGNOSIS — R6883 Chills (without fever): Secondary | ICD-10-CM | POA: Diagnosis not present

## 2017-06-09 DIAGNOSIS — R109 Unspecified abdominal pain: Secondary | ICD-10-CM | POA: Diagnosis not present

## 2017-06-09 DIAGNOSIS — R634 Abnormal weight loss: Secondary | ICD-10-CM

## 2017-06-09 DIAGNOSIS — R350 Frequency of micturition: Secondary | ICD-10-CM

## 2017-06-09 DIAGNOSIS — M549 Dorsalgia, unspecified: Secondary | ICD-10-CM | POA: Diagnosis not present

## 2017-06-09 LAB — COMPREHENSIVE METABOLIC PANEL
ALBUMIN: 4 g/dL (ref 3.5–5.2)
ALK PHOS: 62 U/L (ref 39–117)
ALT: 15 U/L (ref 0–53)
AST: 16 U/L (ref 0–37)
BILIRUBIN TOTAL: 1.2 mg/dL (ref 0.2–1.2)
BUN: 10 mg/dL (ref 6–23)
CALCIUM: 9.5 mg/dL (ref 8.4–10.5)
CO2: 34 mEq/L — ABNORMAL HIGH (ref 19–32)
CREATININE: 1.09 mg/dL (ref 0.40–1.50)
Chloride: 101 mEq/L (ref 96–112)
GFR: 85.16 mL/min (ref >60.00)
Glucose, Bld: 104 mg/dL — ABNORMAL HIGH (ref 70–99)
Potassium: 4 mEq/L (ref 3.5–5.1)
Sodium: 138 mEq/L (ref 135–145)
TOTAL PROTEIN: 6.5 g/dL (ref 6.0–8.3)

## 2017-06-09 LAB — POC URINALSYSI DIPSTICK (AUTOMATED)
Bilirubin, UA: NEGATIVE
Blood, UA: NEGATIVE
GLUCOSE UA: NEGATIVE
Ketones, UA: NEGATIVE
Leukocytes, UA: NEGATIVE
Nitrite, UA: NEGATIVE
Protein, UA: NEGATIVE
SPEC GRAV UA: 1.02 (ref 1.010–1.025)
UROBILINOGEN UA: NEGATIVE U/dL — AB
pH, UA: 6 (ref 5.0–8.0)

## 2017-06-09 LAB — CBC WITH DIFFERENTIAL/PLATELET
BASOS PCT: 1.3 % (ref 0.0–3.0)
Basophils Absolute: 0.1 10*3/uL (ref 0.0–0.1)
EOS PCT: 3.4 % (ref 0.0–5.0)
Eosinophils Absolute: 0.1 10*3/uL (ref 0.0–0.7)
HEMATOCRIT: 43.4 % (ref 39.0–52.0)
HEMOGLOBIN: 14.2 g/dL (ref 13.0–17.0)
LYMPHS PCT: 43.9 % (ref 12.0–46.0)
Lymphs Abs: 1.7 10*3/uL (ref 0.7–4.0)
MCHC: 32.7 g/dL (ref 30.0–36.0)
MCV: 87 fl (ref 78.0–100.0)
MONO ABS: 0.6 10*3/uL (ref 0.1–1.0)
MONOS PCT: 14 % — AB (ref 3.0–12.0)
Neutro Abs: 1.5 10*3/uL (ref 1.4–7.7)
Neutrophils Relative %: 37.4 % — ABNORMAL LOW (ref 43.0–77.0)
Platelets: 303 10*3/uL (ref 150.0–400.0)
RBC: 4.99 Mil/uL (ref 4.22–5.81)
RDW: 13.3 % (ref 11.5–15.5)
WBC: 4 10*3/uL (ref 4.0–10.5)

## 2017-06-09 LAB — AMYLASE: Amylase: 44 U/L (ref 27–131)

## 2017-06-09 LAB — PSA: PSA: 1.22 ng/mL (ref 0.10–4.00)

## 2017-06-09 LAB — LIPASE: Lipase: 13 U/L (ref 11.0–59.0)

## 2017-06-09 MED ORDER — ONDANSETRON 8 MG PO TBDP: 8.0000 mg | ORAL_TABLET | Freq: Three times a day (TID) | ORAL | 0 refills | Status: DC | PRN

## 2017-06-09 NOTE — Progress Notes (Signed)
Subjective:    Patient ID: Brian [name], male    DOB: [birth year], 72 y.o.   MRN: [id number]  HPI   Pt is having pain on both sides past 2 weeks Some pain radiates to groin. Pt has concern about his kidneys. No blood in urine today. Pt kidney function looks goods by labs in the past.  Pt at times feels nausea randomly at time. Some abdomen pain with bitter taste to her mouth as well(when eats greasy foods). This has been going on for past 2 weeks. Reviewed chart/labs in past and no he pylori was negative.  Pt does have some history of possible fat in liver on [place].  Non smoker in past except for only one year when very young.. No alcohol use.  No weight loss recently compared to his range. But 5 lbs since last visit.  Pt expresses concern for his liver, and kidney function.(expresses concern for serious diagnosis)   Review of Systems  Constitutional: Positive for chills. Negative for fatigue and fever.  Respiratory: Negative for cough, chest tightness, shortness of breath and wheezing.   Cardiovascular: Negative for chest pain and palpitations.  Gastrointestinal: Positive for abdominal distention, abdominal pain and nausea. Negative for anal bleeding, blood in stool, constipation, diarrhea, rectal pain and vomiting.  Genitourinary: Positive for frequency.  Musculoskeletal: Positive for back pain. Negative for neck pain and neck stiffness.       Cva area pain.  Skin: Negative for rash.  Neurological: Negative for dizziness, tremors, weakness, numbness and headaches.  Hematological: Negative for adenopathy. Does not bruise/bleed easily.  Psychiatric/Behavioral: Negative for behavioral problems and confusion.    Past Medical History:  Diagnosis Date  . Atypical chest pain     Negative cardiac catheterization 2014  . 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 Blind right eye   . 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 Chicken pox   . Chronic back pain   . Dislocation,  jaw 1960s  . Edema    Left Leg  . Essential hypertension, benign   . GERD (gastroesophageal reflux disease)   . [place] measles   . Heart attack ([place])   . Hyperlipidemia   . Measles   . Mumps   . Osteopenia 11.12.10   bone density test  . Personal history of other diseases of digestive system 08.10.12   Gastric ulcer     Social History   Socioeconomic History  . Marital status: Single    Spouse name: Not on file  . Number of children: Not on file  . Years of education: Not on file  . Highest education level: Not on file  Social Needs  . Financial resource strain: Not on file  . Food insecurity - worry: Not on file  . Food insecurity - inability: Not on file  . Transportation needs - medical: Not on file  . Transportation needs - non-medical: Not on file  Occupational History  . Not on file  Tobacco Use  . Smoking status: Former Smoker    Packs/day: 0.25    Years: 2.00    Pack years: 0.50    Types: Cigarettes    Last attempt to quit: 06/02/1966    Years since quitting: 51.0  . Smokeless tobacco: Never Used  Substance and Sexual Activity  . Alcohol use: No    Alcohol/week: 0.0 oz  . Drug use: No  . Sexual activity: No  Other Topics Concern  . Not on file  Social History Narrative  .  Not on file    Past Surgical History:  Procedure Laterality Date  . ABDOMINAL HERNIA REPAIR  1972  . APPENDECTOMY  1973  . CATARACT EXTRACTION, BILATERAL  2005  . CHOLECYSTECTOMY  08/2016  . ESOPHAGOGASTRODUODENOSCOPY  06.2011  . EYE SURGERY  1972   Bilateral Lens Replacement  . FACIAL RECONSTRUCTION SURGERY  2008  . LEFT HEART CATH  02.17.2014  . [place], 2008   Dislocated Jaw  . PARS PLANA VITRECTOMY W/ REPAIR OF MACULAR HOLE  2005   macular hole repair, right eye  . PARS PLANA VITRECTOMY W/ REPAIR OF MACULAR HOLE  2010   mechanical, left eye  . PROSTATE BIOPSY  [place] 2017   w/ Dr. [place] Crane  . WISDOM TOOTH EXTRACTION      Family History  Problem Relation  Age of Onset  . Alzheimer's disease Mother        Deceased  . Diabetes Maternal Grandmother   . Heart attack Maternal Grandmother   . Prostate cancer Maternal Uncle   . Diabetes Maternal Uncle   . Healthy Son   . Cataracts Maternal Aunt     Allergies  Allergen Reactions  . Gabapentin Other (See Comments)    Feel "weird"; Nausea; Weakness; Syncope    Current Outpatient Medications on File Prior to Visit  Medication Sig Dispense Refill  . aspirin (ASPIR-81) 81 MG EC tablet Take 1 tablet by mouth daily.    Marland Kitchen b complex vitamins tablet Take 1 tablet by mouth daily.    . Calcium Carbonate-Vitamin D (CALCIUM-VITAMIN D3 PO) Take by mouth daily.    . hydrochlorothiazide (MICROZIDE) 12.5 MG capsule Take 1 capsule (12.5 mg total) by mouth daily. 90 capsule 3  . mirabegron ER (MYRBETRIQ) 50 MG TB24 tablet Take 50 mg by mouth daily.    . pantoprazole (PROTONIX) 40 MG tablet Take 1 tablet (40 mg total) by mouth 2 (two) times daily. 180 tablet 3  . predniSONE (DELTASONE) 10 MG tablet 6 tabs po day 1, 5 tabs po day 2, 4 tabs po day 3, 3 tabs po day 4, 2 tabs po day 5, 1 tab po day 6 21 tablet 0  . ranitidine (ZANTAC) 150 MG capsule Take 1 capsule (150 mg total) by mouth 2 (two) times daily. 60 capsule 0  . simvastatin (ZOCOR) 40 MG tablet Take 1 tablet (40 mg total) by mouth every evening. 90 tablet 3  . tamsulosin (FLOMAX) 0.4 MG CAPS capsule Take 2 capsules (0.8 mg total) by mouth daily. 90 capsule 1  . timolol (TIMOPTIC) 0.5 % ophthalmic solution Place 1 drop into the left eye daily. 10 mL 12  . traMADol (ULTRAM) 50 MG tablet Take 1 tablet (50 mg total) by mouth every 6 (six) hours as needed. 20 tablet 0  . triamcinolone ointment (KENALOG) 0.5 % Apply 1 application topically 2 (two) times daily. 30 g 0   No current facility-administered medications on file prior to visit.     BP 122/71   Pulse (!) 50   Temp 97.6 F (36.4 C) (Oral)   Resp 16   Ht 5\' 6"  (1.676 m)   Wt 187 lb 6.4 oz (85 kg)    SpO2 98%   BMI 30.25 kg/m       Objective:   Physical Exam  General Appearance- Not in acute distress.  HEENT Eyes- Scleraeral/Conjuntiva-bilat- Not Yellow. Mouth & Throat- Normal.  Chest and Lung Exam Auscultation: Breath sounds:-Normal. Adventitious sounds:- No Adventitious sounds.  Cardiovascular  Auscultation:Rythm - Regular. Heart Sounds -Normal heart sounds.  Abdomen Inspection:-Inspection Normal.  Palpation/Perucssion: Palpation and Percussion of the abdomen reveal- faint bilateral lower flank pains, No Rebound tenderness, No rigidity(Guarding) and No Palpable abdominal masses.  Liver:-Normal.  Spleen:- Normal.   Back- faint bilateral cva tenderness  Skin no rashes.      Assessment & Plan:  For your history of abdomen pain, bloating, and nausea will get labs listed on your avs.   Will evaluated kidneys, liver, pancrease, infection fighting cells and get bacteria breath test.  Will get ct abd/pelvis as well.  At your request referring to nephrologist.   For abdomen pain continue your protonix. Can add back your zantac if needed. Also rx of zofran.   If symptoms change or worsen notify us but if worse then ED evaluation.  Follow date to be determined after lab review.

## 2017-06-09 NOTE — Patient Instructions (Addendum)
For your history of abdomen pain, bloating, and nausea will get labs listed on your avs.   Will evaluated kidneys, liver, pancrease, infection fighting cells and get bacteria breath test.   Also for frequent urination getting psa and urine cultrue  Will get ct abd/pelvis as well.  At your request referring to nephrologist.   For abdomen pain continue your protonix. Can add back your zantac if needed. Also rx of zofran.   If symptoms change or worsen notify us but if worse then ED evaluation.  Follow date to be determined after lab review.

## 2017-06-10 ENCOUNTER — Ambulatory Visit (HOSPITAL_BASED_OUTPATIENT_CLINIC_OR_DEPARTMENT_OTHER): Admission: RE | Admit: 2017-06-10 | Payer: MEDICARE | Source: Ambulatory Visit

## 2017-06-10 LAB — H. PYLORI BREATH TEST: H. pylori Breath Test: NOT DETECTED

## 2017-06-11 ENCOUNTER — Encounter (HOSPITAL_BASED_OUTPATIENT_CLINIC_OR_DEPARTMENT_OTHER): Payer: Self-pay

## 2017-06-11 ENCOUNTER — Ambulatory Visit (HOSPITAL_BASED_OUTPATIENT_CLINIC_OR_DEPARTMENT_OTHER)
Admission: RE | Admit: 2017-06-11 | Discharge: 2017-06-11 | Disposition: A | Discharge status: Home / Self Care | Payer: MEDICARE | Source: Ambulatory Visit | Attending: Medical | Admitting: Medical

## 2017-06-11 DIAGNOSIS — R110 Nausea: Secondary | ICD-10-CM

## 2017-06-11 DIAGNOSIS — R109 Unspecified abdominal pain: Secondary | ICD-10-CM

## 2017-06-11 DIAGNOSIS — K439 Ventral hernia without obstruction or gangrene: Secondary | ICD-10-CM | POA: Diagnosis not present

## 2017-06-11 DIAGNOSIS — K449 Diaphragmatic hernia without obstruction or gangrene: Secondary | ICD-10-CM | POA: Insufficient documentation

## 2017-06-11 DIAGNOSIS — R140 Abdominal distension (gaseous): Secondary | ICD-10-CM

## 2017-06-11 DIAGNOSIS — I700 Atherosclerosis of aorta: Secondary | ICD-10-CM | POA: Insufficient documentation

## 2017-06-11 DIAGNOSIS — K760 Fatty (change of) liver, not elsewhere classified: Secondary | ICD-10-CM | POA: Diagnosis not present

## 2017-06-11 DIAGNOSIS — R634 Abnormal weight loss: Secondary | ICD-10-CM | POA: Diagnosis present

## 2017-06-11 DIAGNOSIS — I708 Atherosclerosis of other arteries: Secondary | ICD-10-CM | POA: Insufficient documentation

## 2017-06-11 LAB — URINE CULTURE
MICRO NUMBER:: 90028626
Result:: NO GROWTH
SPECIMEN QUALITY:: ADEQUATE

## 2017-06-11 MED ORDER — IOPAMIDOL (ISOVUE-300) INJECTION 61%
100.0000 mL | Freq: Once | INTRAVENOUS | Status: AC | PRN
  Administered 2017-06-11: 100 mL via INTRAVENOUS

## 2017-06-15 ENCOUNTER — Telehealth: Payer: Self-pay | Admitting: Medical

## 2017-06-15 NOTE — Telephone Encounter (Signed)
Attempted to call patient x 2 at [phone], the call was noisy with static, unable to leave message for patient.

## 2017-06-15 NOTE — Telephone Encounter (Signed)
Returned call to pt.  He stated his phone has been acting up and he thought he missed a call from Brian [name].  Questioned if I could answer any questions for him in regards to the CT scan.  The pt. Replied he has an appt. Tomorrow and will discuss his questions with Brian [name] at that time.

## 2017-06-15 NOTE — Telephone Encounter (Signed)
Copied from [place] [phone]. Topic: Quick Communication - Lab Results >> Jun 15, 2017  8:26 AM Hinton Dyer, [place] wrote: Called patient to inform them of 1//2018 lab results. When patient returns call, triage nurse may disclose results.   Pt called back to receive results. Informed him NT would call him back

## 2017-06-16 ENCOUNTER — Encounter: Payer: Self-pay | Admitting: Medical

## 2017-06-16 ENCOUNTER — Ambulatory Visit (INDEPENDENT_AMBULATORY_CARE_PROVIDER_SITE_OTHER): Payer: MEDICARE | Admitting: Medical

## 2017-06-16 VITALS — BP 120/70 | HR 48 | Temp 97.6°F | Resp 16 | Ht 66.0 in | Wt 191.4 lb

## 2017-06-16 DIAGNOSIS — R109 Unspecified abdominal pain: Secondary | ICD-10-CM | POA: Diagnosis not present

## 2017-06-16 DIAGNOSIS — M792 Neuralgia and neuritis, unspecified: Secondary | ICD-10-CM | POA: Diagnosis not present

## 2017-06-16 MED ORDER — TRAMADOL HCL 50 MG PO TABS: ORAL_TABLET | ORAL | 0 refills | Status: DC

## 2017-06-16 MED ORDER — FAMCICLOVIR 500 MG PO TABS: 500.0000 mg | ORAL_TABLET | Freq: Three times a day (TID) | ORAL | 0 refills | Status: DC

## 2017-06-16 NOTE — Patient Instructions (Addendum)
Your workup for your recent abdomen and flank pain was negative.  This workup included a CBC, CBC, amylase, lipase, urine culture, PSA and CT of abdomen.  Most recently your pain features see consistent with nerve type pain.  You report some sharp, transient burning right side flank/abdomen pain.  Some faint pain on light palpation.  In light of the extensive negative workup and these most recent symptoms, you might be having early shingles with neuropathic pain preceding small blister outbreak/findings.  You declined starting antiviral or nerve pain medication presently.  But I am making both available if you have any skin/small blister type outbreak.  If that were to occur please start medications immediately.  Notify us of any changing or worsening signs or symptoms.  Follow-up 2 weeks or as needed

## 2017-06-16 NOTE — Progress Notes (Signed)
Subjective:    Patient ID: Brian [name], male    DOB: 1945-10-28, 72 y.o.   MRN: [id number]  HPI   Pt in for follow up.  Pt had extensive work up for some abdomen pain.   CBC, CMP, PSA, amylase, lipase, urine, urine culture and abd/pelvis ct. All studies were negative.     Below prior hpi. Pt is having pain on both sides past 2 weeks Some pain radiates to groin. Pt has concern about his kidneys. No blood in urine today. Pt kidney function looks goods by labs in the past.  Pt at times feels nausea randomly at time. Some abdomen pain with bitter taste to her mouth as well(when eats greasy foods). This has been going on for past 2 weeks. Reviewed chart/labs in past and no he pylori was negative.  Pt does have some history of possible fat in liver on [place].  Non smoker in past except for only one year when very young.. No alcohol use.  No weight loss recently compared to his range. But 5 lbs since last visit.  Pt expresses concern for his liver, and kidney function.(expresses concern for serious diagnosis) End of prior hpi visit.   Today he is describing some pain that comes and goes rt lower back/side area that is sharp at times and it can burn at times(superficial type pain.). Pain at times sitting. Not associated with eating. Pain occurring 4-5 times a day for last 3-4 days. When has pain will last for 10 seconds or so then subside. No skin rashes or blisters seen.  Review of Systems  Constitutional: Negative for chills, fatigue and fever.  HENT: Negative for congestion, drooling and ear pain.   Respiratory: Negative for cough, chest tightness, shortness of breath and wheezing.   Cardiovascular: Negative for chest pain and palpitations.  Gastrointestinal: Negative for abdominal distention, abdominal pain, blood in stool, constipation, nausea and vomiting.  Genitourinary: Negative for dysuria, flank pain, frequency, genital sores, penile swelling, scrotal swelling and  testicular pain.  Musculoskeletal:       Pain can occur at times with moving. But other times at rest.  Skin: Negative for rash.  Neurological: Negative for dizziness, light-headedness and headaches.  Hematological: Negative for adenopathy. Does not bruise/bleed easily.  Psychiatric/Behavioral: Negative for behavioral problems and confusion.   Past Medical History:  Diagnosis Date  . Atypical chest pain     Negative cardiac catheterization 2014  . 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 Blind right eye   . 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 Chicken pox   . Chronic back pain   . Dislocation, jaw 1960s  . Edema    Left Leg  . Essential hypertension, benign   . GERD (gastroesophageal reflux disease)   . [place] measles   . Heart attack ([place])   . Hyperlipidemia   . Measles   . Mumps   . Osteopenia 11.12.10   bone density test  . Personal history of other diseases of digestive system 08.10.12   Gastric ulcer     Social History   Socioeconomic History  . Marital status: Single    Spouse name: Not on file  . Number of children: Not on file  . Years of education: Not on file  . Highest education level: Not on file  Social Needs  . Financial resource strain: Not on file  . Food insecurity - worry: Not on file  . Food insecurity - inability: Not on file  .  Transportation needs - medical: Not on file  . Transportation needs - non-medical: Not on file  Occupational History  . Not on file  Tobacco Use  . Smoking status: Former Smoker    Packs/day: 0.25    Years: 2.00    Pack years: 0.50    Types: Cigarettes    Last attempt to quit: 06/02/1966    Years since quitting: 51.0  . Smokeless tobacco: Never Used  Substance and Sexual Activity  . Alcohol use: No    Alcohol/week: 0.0 oz  . Drug use: No  . Sexual activity: No  Other Topics Concern  . Not on file  Social History Narrative  . Not on file    Past Surgical History:  Procedure Laterality Date    . ABDOMINAL HERNIA REPAIR  1972  . APPENDECTOMY  1973  . CATARACT EXTRACTION, BILATERAL  2005  . CHOLECYSTECTOMY  08/2016  . ESOPHAGOGASTRODUODENOSCOPY  06.2011  . EYE SURGERY  1972   Bilateral Lens Replacement  . FACIAL RECONSTRUCTION SURGERY  2008  . LEFT HEART CATH  02.17.2014  . [place], 2008   Dislocated Jaw  . PARS PLANA VITRECTOMY W/ REPAIR OF MACULAR HOLE  2005   macular hole repair, right eye  . PARS PLANA VITRECTOMY W/ REPAIR OF MACULAR HOLE  2010   mechanical, left eye  . PROSTATE BIOPSY  [place] 2017   w/ Dr. Nevada Crane  . WISDOM TOOTH EXTRACTION      Family History  Problem Relation Age of Onset  . Alzheimer's disease Mother        Deceased  . Diabetes Maternal Grandmother   . Heart attack Maternal Grandmother   . Prostate cancer Maternal Uncle   . Diabetes Maternal Uncle   . Healthy Son   . Cataracts Maternal Aunt     Allergies  Allergen Reactions  . Gabapentin Other (See Comments)    Feel "weird"; Nausea; Weakness; Syncope    Current Outpatient Medications on File Prior to Visit  Medication Sig Dispense Refill  . aspirin (ASPIR-81) 81 MG EC tablet Take 1 tablet by mouth daily.    Marland Kitchen b complex vitamins tablet Take 1 tablet by mouth daily.    . Calcium Carbonate-Vitamin D (CALCIUM-VITAMIN D3 PO) Take by mouth daily.    . hydrochlorothiazide (MICROZIDE) 12.5 MG capsule Take 1 capsule (12.5 mg total) by mouth daily. 90 capsule 3  . mirabegron ER (MYRBETRIQ) 50 MG TB24 tablet Take 50 mg by mouth daily.    . ondansetron (ZOFRAN ODT) 8 MG disintegrating tablet Take 1 tablet (8 mg total) by mouth every 8 (eight) hours as needed for nausea or vomiting. 20 tablet 0  . pantoprazole (PROTONIX) 40 MG tablet Take 1 tablet (40 mg total) by mouth 2 (two) times daily. 180 tablet 3  . predniSONE (DELTASONE) 10 MG tablet 6 tabs po day 1, 5 tabs po day 2, 4 tabs po day 3, 3 tabs po day 4, 2 tabs po day 5, 1 tab po day 6 21 tablet 0  . ranitidine (ZANTAC) 150 MG  capsule Take 1 capsule (150 mg total) by mouth 2 (two) times daily. 60 capsule 0  . simvastatin (ZOCOR) 40 MG tablet Take 1 tablet (40 mg total) by mouth every evening. 90 tablet 3  . tamsulosin (FLOMAX) 0.4 MG CAPS capsule Take 2 capsules (0.8 mg total) by mouth daily. 90 capsule 1  . timolol (TIMOPTIC) 0.5 % ophthalmic solution Place 1 drop into the left eye daily.  10 mL 12  . traMADol (ULTRAM) 50 MG tablet Take 1 tablet (50 mg total) by mouth every 6 (six) hours as needed. 20 tablet 0  . triamcinolone ointment (KENALOG) 0.5 % Apply 1 application topically 2 (two) times daily. 30 g 0   No current facility-administered medications on file prior to visit.     BP 120/70   Pulse (!) 48   Temp 97.6 F (36.4 C) (Oral)   Resp 16   Ht 5\' 6"  (1.676 m)   Wt 191 lb 6.4 oz (86.8 kg)   SpO2 100%   BMI 30.89 kg/m       Objective:   Physical Exam  General Appearance- Not in acute distress.  HEENT Eyes- Scleraeral/Conjuntiva-bilat- Not Yellow. Mouth & Throat- Normal.  Chest and Lung Exam Auscultation: Breath sounds:-Normal. Adventitious sounds:- No Adventitious sounds.  Cardiovascular Auscultation:Rythm - Regular. Heart Sounds -Normal heart sounds.  Abdomen Inspection:-Inspection Normal.  Palpation/Perucssion: Palpation and Percussion of the abdomen reveal- faint bilateral lower flank pains, No Rebound tenderness, No rigidity(Guarding) and No Palpable abdominal masses.  Liver:-Normal.  Spleen:- Normal.   Back- faint bilateral cva tenderness   Skin(rt lower flank from lower lumbar rt side toward his abdomen umbilicus level-no rashes. At times tender to touch. Has slight itch sensation. But he has no rash. No vesicles on close inspection      Assessment & Plan:  Your workup for your recent abdomen and flank pain was negative.  This workup included a CBC, CBC, amylase, lipase, urine culture, PSA and CT of abdomen.  Most recently your pain features see consistent with  nerve type pain.  You report some sharp, transient burning right side flank/abdomen pain.  Some faint pain on light palpation.  In light of the extensive negative workup and these most recent symptoms, you might be having early shingles with neuropathic pain preceding  small blister outbreak/findings.  You declined starting antiviral or nerve pain medication presently.  But I am making both available if you have any skin/small blister type outbreak.  If that were to occur please start medications immediately.  Notify us of any changing or worsening signs or symptoms.  Follow-up 2 weeks or as needed  Liahm Grivas, Percell Miller, [employer]

## 2017-08-04 ENCOUNTER — Ambulatory Visit (INDEPENDENT_AMBULATORY_CARE_PROVIDER_SITE_OTHER): Payer: MEDICARE | Admitting: Medical

## 2017-08-04 ENCOUNTER — Encounter: Payer: Self-pay | Admitting: Medical

## 2017-08-04 ENCOUNTER — Ambulatory Visit (HOSPITAL_BASED_OUTPATIENT_CLINIC_OR_DEPARTMENT_OTHER)
Admission: RE | Admit: 2017-08-04 | Discharge: 2017-08-04 | Disposition: A | Discharge status: Home / Self Care | Payer: MEDICARE | Source: Ambulatory Visit | Attending: Medical | Admitting: Medical

## 2017-08-04 VITALS — BP 120/68 | HR 54 | Temp 97.4°F | Resp 16 | Ht 66.0 in | Wt 185.2 lb

## 2017-08-04 DIAGNOSIS — H9193 Unspecified hearing loss, bilateral: Secondary | ICD-10-CM

## 2017-08-04 DIAGNOSIS — D179 Benign lipomatous neoplasm, unspecified: Secondary | ICD-10-CM

## 2017-08-04 DIAGNOSIS — M25561 Pain in right knee: Secondary | ICD-10-CM | POA: Insufficient documentation

## 2017-08-04 DIAGNOSIS — M25562 Pain in left knee: Secondary | ICD-10-CM | POA: Diagnosis present

## 2017-08-04 DIAGNOSIS — R229 Localized swelling, mass and lump, unspecified: Secondary | ICD-10-CM

## 2017-08-04 DIAGNOSIS — R222 Localized swelling, mass and lump, trunk: Secondary | ICD-10-CM | POA: Diagnosis not present

## 2017-08-04 DIAGNOSIS — IMO0002 Reserved for concepts with insufficient information to code with codable children: Secondary | ICD-10-CM

## 2017-08-04 DIAGNOSIS — R109 Unspecified abdominal pain: Secondary | ICD-10-CM | POA: Diagnosis not present

## 2017-08-04 LAB — POC URINALSYSI DIPSTICK (AUTOMATED)
Bilirubin, UA: NEGATIVE
Glucose, UA: NEGATIVE
Ketones, UA: NEGATIVE
LEUKOCYTES UA: NEGATIVE
NITRITE UA: NEGATIVE
PH UA: 6 (ref 5.0–8.0)
PROTEIN UA: NEGATIVE
RBC UA: NEGATIVE
Spec Grav, UA: 1.025 (ref 1.010–1.025)
Urobilinogen, UA: NEGATIVE E.U./dL — AB

## 2017-08-04 MED ORDER — DICLOFENAC SODIUM 75 MG PO TBEC: 75.0000 mg | DELAYED_RELEASE_TABLET | Freq: Two times a day (BID) | ORAL | 0 refills | Status: DC

## 2017-08-04 MED FILL — DICLOFENAC SODIUM 75 MG TAB: 75 | 10 days supply | Qty: 20 | Fill #0

## 2017-08-04 NOTE — Progress Notes (Signed)
Subjective:    Patient ID: Brian [name], male    DOB: 1945-07-04, 72 y.o.   MRN: [id number]  HPI  Pt is still having pain in his rt side back and his flank. In January similar pain and CT of abdomen and pelvis was ok except some equivical fat on his liver. [place] in the summer did show some fat on his liver. Also open hernia repair rt side and open appendectomy in 1970's.  No kidney stones noted on his most recent CT imaging.  He also has more knee pain. He describes crepitus when going up and down stairs.Moderate pain. This has been for one month.  Pt also states when he drinks milk it will make his stomach upset. Pt states for 2 weeks his stomach is upset with milk. Hx of some mild intermittent upset stomach in past with millk but worse now.   On discussion he points out that one area on his back beneath his ribs right side and above his kidney is most tender area.       Review of Systems  Constitutional: Negative for chills, fatigue and fever.  Respiratory: Negative for cough, shortness of breath and wheezing.   Cardiovascular: Negative for palpitations.  Gastrointestinal: Positive for abdominal pain. Negative for abdominal distention, anal bleeding, blood in stool, constipation, diarrhea, nausea, rectal pain and vomiting.  Musculoskeletal: Positive for back pain. Negative for joint swelling, myalgias, neck pain and neck stiffness.  Skin: Negative for rash.  Neurological: Negative for dizziness, syncope, weakness, numbness and headaches.  Psychiatric/Behavioral: Negative for agitation, behavioral problems, dysphoric mood, self-injury, sleep disturbance and suicidal ideas. The patient is not nervous/anxious.    Past Medical History:  Diagnosis Date  . Atypical chest pain     Negative cardiac catheterization 2014  . 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 Blind right eye   . 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 Chicken pox   . Chronic back pain   . Dislocation,  jaw 1960s  . Edema    Left Leg  . Essential hypertension, benign   . GERD (gastroesophageal reflux disease)   . [place] measles   . Heart attack ([place])   . Hyperlipidemia   . Measles   . Mumps   . Osteopenia 11.12.10   bone density test  . Personal history of other diseases of digestive system 08.10.12   Gastric ulcer     Social History   Socioeconomic History  . Marital status: Single    Spouse name: Not on file  . Number of children: Not on file  . Years of education: Not on file  . Highest education level: Not on file  Social Needs  . Financial resource strain: Not on file  . Food insecurity - worry: Not on file  . Food insecurity - inability: Not on file  . Transportation needs - medical: Not on file  . Transportation needs - non-medical: Not on file  Occupational History  . Not on file  Tobacco Use  . Smoking status: Former Smoker    Packs/day: 0.25    Years: 2.00    Pack years: 0.50    Types: Cigarettes    Last attempt to quit: 06/02/1966    Years since quitting: 51.2  . Smokeless tobacco: Never Used  Substance and Sexual Activity  . Alcohol use: No    Alcohol/week: 0.0 oz  . Drug use: No  . Sexual activity: No  Other Topics Concern  . Not on file  Social History Narrative  . Not on file    Past Surgical History:  Procedure Laterality Date  . ABDOMINAL HERNIA REPAIR  1972  . APPENDECTOMY  1973  . CATARACT EXTRACTION, BILATERAL  2005  . CHOLECYSTECTOMY  08/2016  . ESOPHAGOGASTRODUODENOSCOPY  06.2011  . EYE SURGERY  1972   Bilateral Lens Replacement  . FACIAL RECONSTRUCTION SURGERY  2008  . LEFT HEART CATH  02.17.2014  . [place], 2008   Dislocated Jaw  . PARS PLANA VITRECTOMY W/ REPAIR OF MACULAR HOLE  2005   macular hole repair, right eye  . PARS PLANA VITRECTOMY W/ REPAIR OF MACULAR HOLE  2010   mechanical, left eye  . PROSTATE BIOPSY  [place] 2017   w/ Dr. [place] Crane  . WISDOM TOOTH EXTRACTION      Family History  Problem Relation  Age of Onset  . Alzheimer's disease Mother        Deceased  . Diabetes Maternal Grandmother   . Heart attack Maternal Grandmother   . Prostate cancer Maternal Uncle   . Diabetes Maternal Uncle   . Healthy Son   . Cataracts Maternal Aunt     Allergies  Allergen Reactions  . Gabapentin Other (See Comments)    Feel "weird"; Nausea; Weakness; Syncope    Current Outpatient Medications on File Prior to Visit  Medication Sig Dispense Refill  . omeprazole (PRILOSEC) 40 MG capsule Take 1 capsule by mouth twice daily before breakfast and dinner    . aspirin (ASPIR-81) 81 MG EC tablet Take 1 tablet by mouth daily.    Marland Kitchen b complex vitamins tablet Take 1 tablet by mouth daily.    . Calcium Carbonate-Vitamin D (CALCIUM-VITAMIN D3 PO) Take by mouth daily.    . famciclovir (FAMVIR) 500 MG tablet Take 1 tablet (500 mg total) by mouth 3 (three) times daily. 21 tablet 0  . hydrochlorothiazide (MICROZIDE) 12.5 MG capsule Take 1 capsule (12.5 mg total) by mouth daily. 90 capsule 3  . mirabegron ER (MYRBETRIQ) 50 MG TB24 tablet Take 50 mg by mouth daily.    . ondansetron (ZOFRAN ODT) 8 MG disintegrating tablet Take 1 tablet (8 mg total) by mouth every 8 (eight) hours as needed for nausea or vomiting. 20 tablet 0  . pantoprazole (PROTONIX) 40 MG tablet Take 1 tablet (40 mg total) by mouth 2 (two) times daily. 180 tablet 3  . ranitidine (ZANTAC) 150 MG capsule Take 1 capsule (150 mg total) by mouth 2 (two) times daily. 60 capsule 0  . simvastatin (ZOCOR) 40 MG tablet Take 1 tablet (40 mg total) by mouth every evening. 90 tablet 3  . tamsulosin (FLOMAX) 0.4 MG CAPS capsule Take 2 capsules (0.8 mg total) by mouth daily. 90 capsule 1  . timolol (TIMOPTIC) 0.5 % ophthalmic solution Place 1 drop into the left eye daily. 10 mL 12  . traMADol (ULTRAM) 50 MG tablet 1 tab po q 8 hours prn pain 15 tablet 0  . triamcinolone ointment (KENALOG) 0.5 % Apply 1 application topically 2 (two) times daily. 30 g 0   No  current facility-administered medications on file prior to visit.     BP 120/68   Pulse (!) 54   Temp (!) 97.4 F (36.3 C) (Oral)   Resp 16   Ht 5\' 6"  (1.676 m)   Wt 185 lb 3.2 oz (84 kg)   SpO2 100%   BMI 29.89 kg/m       Objective:   Physical Exam  General Appearance- Not in acute distress.  HEENT Eyes- Scleraeral/Conjuntiva-bilat- Not Yellow. Mouth & Throat- Normal.  Chest and Lung Exam Auscultation: Breath sounds:-Normal. Adventitious sounds:- No Adventitious sounds.  Cardiovascular Auscultation:Rythm - Regular. Heart Sounds -Normal heart sounds.  Abdomen Inspection:-Inspection Normal.  Palpation/Perucssion: Palpation and Percussion of the abdomen reveal-faint mild tenderness palpation right upper quadrant, also slightly tender to palpation in the right lower quadrant over his large scar.  No Rebound tenderness, No rigidity(Guarding) and No Palpable abdominal masses.  Liver:-Normal.  Spleen:- Normal.    Back- no direct cva tenderness. Below lower rib tender area on palpation. Possible 2 cm sized lipoma. Tender to palpation.  Skin-on inspection of his skin there is no rash, lesions or vesicles.     Assessment & Plan:  For your bilateral recent knee pain, I put in orders to get x-rays of both sides.  Also will go ahead and prescribe you diclofenac to help with the pain.  For your history of reported intolerance to drinking milk, I would recommend that you do a trial of 2-3 weeks with no milk.  Recommended she do desire taste of milk try lactose-free milk.  If you do not drink milk then recommend getting some calcium tablets over-the-counter.  Your right side abdomen/flank area pain probably is related to fatty liver and possible adhesions.  I think this is the case since prior CT imaging was negative, no blood in urine today, and you have no gallbladder or appendix.  Studies have shown fatty liver and you have had to open procedures in the right lower quadrant  in the 70s(so considering adhesion pain).  However if your pain does worsen or change then would recommend repeat imaging studies.  Presently diclofenac for your knee pain might help with some of the year intermittent abdomen discomfort.  For the area on your back which he feels like possible 2.5 cm lipoma, I did place ultrasound order and you can get that scheduled today.  Follow-up in 2-3 weeks or as needed.  Mackie Pai, PA-C

## 2017-08-04 NOTE — Patient Instructions (Addendum)
For your bilateral recent knee pain, I put in orders to get x-rays of both sides.  Also will go ahead and prescribe you diclofenac to help with the pain.  For your history of reported intolerance to drinking milk, I would recommend that you do a trial of 2-3 weeks with no milk.  Recommended she do desire taste of milk try lactose-free milk.  If you do not drink milk then recommend getting some calcium tablets over-the-counter.  Your right side abdomen/flank area pain probably is related to fatty liver and possible adhesions.  I think this is the case since prior CT imaging was negative, no blood in urine today, and you have no gallbladder or appendix.  Studies have shown fatty liver and you have had to open procedures in the right lower quadrant in the 70s.  However if your pain does worsen or change then would recommend repeat imaging studies.  Presently diclofenac for your knee pain might help with some of the year intermittent abdomen discomfort.  For the area on your back which he feels like possible 2.5 cm lipoma, I did place ultrasound order and you can get that scheduled today.  Follow-up in 2-3 weeks or as needed.

## 2017-08-25 ENCOUNTER — Encounter: Payer: Self-pay | Admitting: Medical

## 2017-08-25 ENCOUNTER — Ambulatory Visit (HOSPITAL_BASED_OUTPATIENT_CLINIC_OR_DEPARTMENT_OTHER)
Admission: RE | Admit: 2017-08-25 | Discharge: 2017-08-25 | Disposition: A | Discharge status: Home / Self Care | Payer: MEDICARE | Source: Ambulatory Visit | Attending: Medical | Admitting: Medical

## 2017-08-25 ENCOUNTER — Ambulatory Visit (INDEPENDENT_AMBULATORY_CARE_PROVIDER_SITE_OTHER): Payer: MEDICARE | Admitting: Medical

## 2017-08-25 VITALS — BP 124/77 | HR 56 | Resp 16 | Ht 66.0 in | Wt 193.0 lb

## 2017-08-25 DIAGNOSIS — M778 Other enthesopathies, not elsewhere classified: Secondary | ICD-10-CM | POA: Insufficient documentation

## 2017-08-25 DIAGNOSIS — R918 Other nonspecific abnormal finding of lung field: Secondary | ICD-10-CM | POA: Insufficient documentation

## 2017-08-25 DIAGNOSIS — R0781 Pleurodynia: Secondary | ICD-10-CM

## 2017-08-25 DIAGNOSIS — Z1211 Encounter for screening for malignant neoplasm of colon: Secondary | ICD-10-CM | POA: Diagnosis not present

## 2017-08-25 DIAGNOSIS — I700 Atherosclerosis of aorta: Secondary | ICD-10-CM | POA: Insufficient documentation

## 2017-08-25 NOTE — Patient Instructions (Signed)
For your right side posterior rib region pain and right forearm pain post fall, we will get x-rays of both regions to see if he may have suffered a fracture.  Regarding your transient upper pectoralis pain on lifting things, I think this is likely directly associated from straining of the muscles when you tried to brace herself with both arms while falling to the ground.  Prior EKG looked good.  Lipid panel in the past only showed slight elevation.  Would recommend watching for any changing features of this transient pain.  If any constant type pain or cardiac associated type symptoms then be seen in the ED.  For your moderate intermittent pain would recommend using diclofenac which you have at home.  Regarding your colonoscopy, I did place referral for that to be done.  I asked referral staff to call for office and inquire as to when year follow-up repeat screening colonoscopy should be done.   Follow-up in 2 to 3 weeks or as needed.

## 2017-08-25 NOTE — Progress Notes (Signed)
Subjective:    Patient ID: Brian [name], male    DOB: 1946-04-16, 72 y.o.   MRN: [id number]  HPI  Pt in for follow up on fall 2 weeks. Pt he was looking for  a crayon in a  box for young relative. Pt stepped to sit down and chair shift and he landed on his buttox. Pt tried to catch himself. Since then has some rt  lower back/rib area pan pain and both forearms have been sore. Rt side forearm still sore. Last week left forearm subsided a lot.Left side not painful toda.  Pt notes rt forearm hurts when he squeezes bottle to clean bathroom.  No loss of consciousness from the fall.  Pt has rx of diclofenac. He uses this sparingly in past for pain. He tends not to want to use medication unless he has to.  Also since fall he states upper pectoralis area pain that hurts very briefly for just 1-2 seconds then resolved after he finishes lifting. No associated cardiac  type pain.(slight soreness on lifting came on day after fall)  Some upper para thoracic tenderness/pain when he stands up right.  Pt states he got automated call from someone he thinks medicare stating he needs refer to [facility] for [facility]. Pt thinks he is due in 2021. Pt had prioror study with GI in [place]. He states address [address].   Pain level at most 4/10.    Review of Systems  Constitutional: Negative for chills, fatigue and fever.  Respiratory: Negative for cough, chest tightness and shortness of breath.   Cardiovascular: Negative for chest pain.  Gastrointestinal: Negative for abdominal pain.  Musculoskeletal:       See hpi  Skin: Negative for rash.       See physical exam. Bruise.  Neurological: Negative for dizziness, speech difficulty, weakness, numbness and headaches.  Hematological: Negative for adenopathy. Does not bruise/bleed easily.  Psychiatric/Behavioral: Negative for behavioral problems and confusion. The patient is not nervous/anxious.    Past Medical History:  Diagnosis Date  . Atypical  chest pain     Negative cardiac catheterization 2014  . 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 Blind right eye   . 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 Chicken pox   . Chronic back pain   . Dislocation, jaw 1960s  . Edema    Left Leg  . Essential hypertension, benign   . GERD (gastroesophageal reflux disease)   . [place] measles   . Heart attack ([place])   . Hyperlipidemia   . Measles   . Mumps   . Osteopenia 11.12.10   bone density test  . Personal history of other diseases of digestive system 08.10.12   Gastric ulcer     Social History   Socioeconomic History  . Marital status: Single    Spouse name: Not on file  . Number of children: Not on file  . Years of education: Not on file  . Highest education level: Not on file  Occupational History  . Not on file  Social Needs  . Financial resource strain: Not on file  . Food insecurity:    Worry: Not on file    Inability: Not on file  . Transportation needs:    Medical: Not on file    Non-medical: Not on file  Tobacco Use  . Smoking status: Former Smoker    Packs/day: 0.25    Years: 2.00    Pack years: 0.50    Types: Cigarettes  Last attempt to quit: 06/02/1966    Years since quitting: 51.2  . Smokeless tobacco: Never Used  Substance and Sexual Activity  . Alcohol use: No    Alcohol/week: 0.0 oz  . Drug use: No  . Sexual activity: Never  Lifestyle  . Physical activity:    Days per week: Not on file    Minutes per session: Not on file  . Stress: Not on file  Relationships  . Social connections:    Talks on phone: Not on file    Gets together: Not on file    Attends religious service: Not on file    Active member of club or organization: Not on file    Attends meetings of clubs or organizations: Not on file    Relationship status: Not on file  . Intimate partner violence:    Fear of current or ex partner: Not on file    Emotionally abused: Not on file    Physically abused: Not on file     Forced sexual activity: Not on file  Other Topics Concern  . Not on file  Social History Narrative  . Not on file    Past Surgical History:  Procedure Laterality Date  . ABDOMINAL HERNIA REPAIR  1972  . APPENDECTOMY  1973  . CATARACT EXTRACTION, BILATERAL  2005  . CHOLECYSTECTOMY  08/2016  . ESOPHAGOGASTRODUODENOSCOPY  06.2011  . EYE SURGERY  1972   Bilateral Lens Replacement  . FACIAL RECONSTRUCTION SURGERY  2008  . LEFT HEART CATH  02.17.2014  . [place], 2008   Dislocated Jaw  . PARS PLANA VITRECTOMY W/ REPAIR OF MACULAR HOLE  2005   macular hole repair, right eye  . PARS PLANA VITRECTOMY W/ REPAIR OF MACULAR HOLE  2010   mechanical, left eye  . PROSTATE BIOPSY  [place] 2017   w/ Dr. Nevada Crane  . WISDOM TOOTH EXTRACTION      Family History  Problem Relation Age of Onset  . Alzheimer's disease Mother        Deceased  . Diabetes Maternal Grandmother   . Heart attack Maternal Grandmother   . Prostate cancer Maternal Uncle   . Diabetes Maternal Uncle   . Healthy Son   . Cataracts Maternal Aunt     Allergies  Allergen Reactions  . Gabapentin Other (See Comments)    Feel "weird"; Nausea; Weakness; Syncope    Current Outpatient Medications on File Prior to Visit  Medication Sig Dispense Refill  . aspirin (ASPIR-81) 81 MG EC tablet Take 1 tablet by mouth daily.    Marland Kitchen b complex vitamins tablet Take 1 tablet by mouth daily.    . Calcium Carbonate-Vitamin D (CALCIUM-VITAMIN D3 PO) Take by mouth daily.    . diclofenac (VOLTAREN) 75 MG EC tablet Take 1 tablet (75 mg total) by mouth 2 (two) times daily. 20 tablet 0  . famciclovir (FAMVIR) 500 MG tablet Take 1 tablet (500 mg total) by mouth 3 (three) times daily. 21 tablet 0  . hydrochlorothiazide (MICROZIDE) 12.5 MG capsule Take 1 capsule (12.5 mg total) by mouth daily. 90 capsule 3  . mirabegron ER (MYRBETRIQ) 50 MG TB24 tablet Take 50 mg by mouth daily.    . ondansetron (ZOFRAN ODT) 8 MG disintegrating tablet Take  1 tablet (8 mg total) by mouth every 8 (eight) hours as needed for nausea or vomiting. 20 tablet 0  . pantoprazole (PROTONIX) 40 MG tablet Take 1 tablet (40 mg total) by mouth 2 (two)  times daily. 180 tablet 3  . ranitidine (ZANTAC) 150 MG capsule Take 1 capsule (150 mg total) by mouth 2 (two) times daily. 60 capsule 0  . simvastatin (ZOCOR) 40 MG tablet Take 1 tablet (40 mg total) by mouth every evening. 90 tablet 3  . tamsulosin (FLOMAX) 0.4 MG CAPS capsule Take 2 capsules (0.8 mg total) by mouth daily. 90 capsule 1  . timolol (TIMOPTIC) 0.5 % ophthalmic solution Place 1 drop into the left eye daily. 10 mL 12  . traMADol (ULTRAM) 50 MG tablet 1 tab po q 8 hours prn pain 15 tablet 0  . triamcinolone ointment (KENALOG) 0.5 % Apply 1 application topically 2 (two) times daily. 30 g 0   No current facility-administered medications on file prior to visit.     BP 124/77 (BP Location: Right Arm, Patient Position: Sitting, Cuff Size: Normal)   Pulse (!) 56   Resp 16   Ht 5\' 6"  (1.676 m)   Wt 193 lb (87.5 kg)   SpO2 96%   BMI 31.15 kg/m       Objective:   Physical Exam  General Mental Status- Alert. General Appearance- Not in acute distress.   Skin General: Color- Normal Color. Moisture- Normal Moisture.  Neck Carotid Arteries- Normal color. Moisture- Normal Moisture. No carotid bruits. No JVD.  Chest and Lung Exam Auscultation: Breath Sounds:-Normal.  Cardiovascular Auscultation:Rythm- Regular. Murmurs & Other Heart Sounds:Auscultation of the heart reveals- No Murmurs.  Abdomen Inspection:-Inspeection Normal. Palpation/Percussion:Note:No mass. Palpation and Percussion of the abdomen reveal- Non Tender, Non Distended + BS, no rebound or guarding.    Neurologic Cranial Nerve exam:- CN III-XII intact(No nystagmus), symmetric smile. Strength:- 5/5 equal and symmetric strength both upper and lower extremities.  Back- no mid lumbar pain. Some faint brusing over rt cva area. Rt  lower rib above cva area mild tender to palption. RT upper ext- no upper arm pain, no elbow pain on palpation. But rt forearm tenderness to palpation. Left upper ext- no pain on palpation or range of motion. Anterior thorax- no direct pain on palpation. Thoracic spine- no direct pain on palpation.      Assessment & Plan:  For your right side posterior rib region pain and right forearm pain post fall, we will get x-rays of both regions to see if he may have suffered a fracture.  Regarding your transient upper pectoralis pain on lifting things, I think this is likely directly associated from straining of the muscles when you tried to brace herself with both arms while falling to the ground.  Prior EKG looked good.  Lipid panel in the past only showed slight elevation.  Would recommend watching for any changing features of this transient pain.  If any constant type pain or cardiac associated type symptoms then be seen in the ED.  For your moderate intermittent pain would recommend using diclofenac which you have at home.  Regarding your colonoscopy, I did place referral for that to be done.  I asked referral staff to call for office and inquire as to when year follow-up repeat screening colonoscopy should be done.   Follow-up in 2 to 3 weeks or as needed.  Mackie Pai, PA-C

## 2017-08-28 ENCOUNTER — Telehealth: Payer: Self-pay

## 2017-08-28 NOTE — Telephone Encounter (Signed)
Copied from [facility] [id number]. Topic: Referral - Status >> Aug 27, 2017 11:58 AM Scherrie Gerlach wrote: Reason for CRM: Manuela Schwartz with [facility] calling to advise they have contacted the pt per referral. Pt has declined to make an appt at this time.   Pt told Manuela Schwartz he wanting to come for side pain after falling, but he no longer has that pain. Pt is not due for routine EDD until 2020 so they cannot schedule the pt at this time.

## 2017-08-28 NOTE — Telephone Encounter (Signed)
Ok. Would you mind abstracting his new due date. Maintenance currently states 2019. Thanks,

## 2017-08-28 NOTE — Telephone Encounter (Signed)
FYI

## 2017-09-15 ENCOUNTER — Ambulatory Visit (INDEPENDENT_AMBULATORY_CARE_PROVIDER_SITE_OTHER): Payer: MEDICARE | Admitting: Medical

## 2017-09-15 ENCOUNTER — Encounter: Payer: Self-pay | Admitting: Medical

## 2017-09-15 VITALS — BP 127/77 | HR 53 | Temp 97.6°F | Resp 16 | Ht 66.0 in | Wt 188.4 lb

## 2017-09-15 DIAGNOSIS — J301 Allergic rhinitis due to pollen: Secondary | ICD-10-CM

## 2017-09-15 DIAGNOSIS — R059 Cough, unspecified: Secondary | ICD-10-CM

## 2017-09-15 DIAGNOSIS — J3489 Other specified disorders of nose and nasal sinuses: Secondary | ICD-10-CM

## 2017-09-15 DIAGNOSIS — R05 Cough: Secondary | ICD-10-CM

## 2017-09-15 MED ORDER — DICLOFENAC SODIUM 75 MG PO TBEC: 75.0000 mg | DELAYED_RELEASE_TABLET | Freq: Two times a day (BID) | ORAL | 0 refills | Status: DC

## 2017-09-15 MED ORDER — AZITHROMYCIN 250 MG PO TABS: ORAL_TABLET | ORAL | 0 refills | Status: DC

## 2017-09-15 MED ORDER — METHYLPREDNISOLONE ACETATE 40 MG/ML IJ SUSP
40.0000 mg | Freq: Once | INTRAMUSCULAR | Status: AC
Start: 2017-09-15 — End: 2017-09-15
  Administered 2017-09-15: 40 mg via INTRAMUSCULAR

## 2017-09-15 MED ORDER — LEVOCETIRIZINE DIHYDROCHLORIDE 5 MG PO TABS: 5.0000 mg | ORAL_TABLET | Freq: Every evening | ORAL | 0 refills | Status: DC

## 2017-09-15 MED ORDER — BENZONATATE 100 MG PO CAPS: 100.0000 mg | ORAL_CAPSULE | Freq: Three times a day (TID) | ORAL | 0 refills | Status: DC | PRN

## 2017-09-15 MED FILL — BENZONATATE 100 MG CAPSULE: 100 | 10 days supply | Qty: 30 | Fill #0

## 2017-09-15 MED FILL — AZITHROMYCIN 250 MG TABLET: 250 | 5 days supply | Qty: 6 | Fill #0

## 2017-09-15 MED FILL — LEVOCETIRIZINE 5 MG TABLET: 5 | 30 days supply | Qty: 30 | Fill #0

## 2017-09-15 NOTE — Progress Notes (Signed)
de

## 2017-09-15 NOTE — Patient Instructions (Signed)
You do appear to have recent allergic rhinitis flare. Prescription of Xyzal antihistamine sent to your pharmacy.  Discussion on use of nasal steroid today but you declined since nasal medications are irritating.  Decided on using low-dose Depo-Medrol to help relieve allergy symptoms quickly.  For cough, I prescribed benzonatate.  Some concern today that productive cough indicates bronchitis.  So I went ahead and sent in a azithromycin antibiotic to your pharmacy.  If with the above medications your symptoms do not improve dramatically by Thursday then recommend starting antibiotic.  Follow-up in 7 days or as needed.

## 2017-09-15 NOTE — Progress Notes (Signed)
Subjective:    Patient ID: Brian [name], male    DOB: 07/06/1945, 72 y.o.   MRN: [id number]  HPI  Pt in with some cough over last 2 weeks. Cough is productive for past 2 weeks. Has a lot of pnd.  Pt has been sneezing a lot. States reason is pollen.  Mild frontal region pain/pressure.  Pt does have some chills but no fever.  Pt still has same rt side abdomen/back pain for which has been worked up.See end of march xray report and [place] report as well(done earlier in month). No change in features of pain per pt. Same as before.     Review of Systems  Constitutional: Positive for chills. Negative for fatigue and fever.  HENT: Positive for congestion, postnasal drip, rhinorrhea and sinus pressure. Negative for sinus pain, sore throat and tinnitus.   Respiratory: Positive for cough. Negative for shortness of breath and wheezing.   Cardiovascular: Negative for chest pain and palpitations.  Gastrointestinal: Negative for abdominal pain.  Musculoskeletal: Positive for back pain.  Skin: Negative for rash and wound.  Neurological: Negative for dizziness, seizures, weakness and headaches.  Hematological: Negative for adenopathy. Does not bruise/bleed easily.  Psychiatric/Behavioral: Negative for behavioral problems and confusion.   Past Medical History:  Diagnosis Date  . Atypical chest pain     Negative cardiac catheterization 2014  . 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 Blind right eye   . 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 Chicken pox   . Chronic back pain   . Dislocation, jaw 1960s  . Edema    Left Leg  . Essential hypertension, benign   . GERD (gastroesophageal reflux disease)   . [place] measles   . Heart attack ([place])   . Hyperlipidemia   . Measles   . Mumps   . Osteopenia 11.12.10   bone density test  . Personal history of other diseases of digestive system 08.10.12   Gastric ulcer     Social History   Socioeconomic History  . Marital status:  Single    Spouse name: Not on file  . Number of children: Not on file  . Years of education: Not on file  . Highest education level: Not on file  Occupational History  . Not on file  Social Needs  . Financial resource strain: Not on file  . Food insecurity:    Worry: Not on file    Inability: Not on file  . Transportation needs:    Medical: Not on file    Non-medical: Not on file  Tobacco Use  . Smoking status: Former Smoker    Packs/day: 0.25    Years: 2.00    Pack years: 0.50    Types: Cigarettes    Last attempt to quit: 06/02/1966    Years since quitting: 51.3  . Smokeless tobacco: Never Used  Substance and Sexual Activity  . Alcohol use: No    Alcohol/week: 0.0 oz  . Drug use: No  . Sexual activity: Never  Lifestyle  . Physical activity:    Days per week: Not on file    Minutes per session: Not on file  . Stress: Not on file  Relationships  . Social connections:    Talks on phone: Not on file    Gets together: Not on file    Attends religious service: Not on file    Active member of club or organization: Not on file    Attends meetings of clubs or  organizations: Not on file    Relationship status: Not on file  . Intimate partner violence:    Fear of current or ex partner: Not on file    Emotionally abused: Not on file    Physically abused: Not on file    Forced sexual activity: Not on file  Other Topics Concern  . Not on file  Social History Narrative  . Not on file    Past Surgical History:  Procedure Laterality Date  . ABDOMINAL HERNIA REPAIR  1972  . APPENDECTOMY  1973  . CATARACT EXTRACTION, BILATERAL  2005  . CHOLECYSTECTOMY  08/2016  . ESOPHAGOGASTRODUODENOSCOPY  06.2011  . EYE SURGERY  1972   Bilateral Lens Replacement  . FACIAL RECONSTRUCTION SURGERY  2008  . LEFT HEART CATH  02.17.2014  . [place], 2008   Dislocated Jaw  . PARS PLANA VITRECTOMY W/ REPAIR OF MACULAR HOLE  2005   macular hole repair, right eye  . PARS PLANA  VITRECTOMY W/ REPAIR OF MACULAR HOLE  2010   mechanical, left eye  . PROSTATE BIOPSY  [place] 2017   w/ Dr. [place] Crane  . WISDOM TOOTH EXTRACTION      Family History  Problem Relation Age of Onset  . Alzheimer's disease Mother        Deceased  . Diabetes Maternal Grandmother   . Heart attack Maternal Grandmother   . Prostate cancer Maternal Uncle   . Diabetes Maternal Uncle   . Healthy Son   . Cataracts Maternal Aunt     Allergies  Allergen Reactions  . Gabapentin Other (See Comments)    Feel "weird"; Nausea; Weakness; Syncope    Current Outpatient Medications on File Prior to Visit  Medication Sig Dispense Refill  . aspirin (ASPIR-81) 81 MG EC tablet Take 1 tablet by mouth daily.    Marland Kitchen b complex vitamins tablet Take 1 tablet by mouth daily.    . Calcium Carbonate-Vitamin D (CALCIUM-VITAMIN D3 PO) Take by mouth daily.    . famciclovir (FAMVIR) 500 MG tablet Take 1 tablet (500 mg total) by mouth 3 (three) times daily. 21 tablet 0  . hydrochlorothiazide (MICROZIDE) 12.5 MG capsule Take 1 capsule (12.5 mg total) by mouth daily. 90 capsule 3  . mirabegron ER (MYRBETRIQ) 50 MG TB24 tablet Take 50 mg by mouth daily.    . ondansetron (ZOFRAN ODT) 8 MG disintegrating tablet Take 1 tablet (8 mg total) by mouth every 8 (eight) hours as needed for nausea or vomiting. 20 tablet 0  . pantoprazole (PROTONIX) 40 MG tablet Take 1 tablet (40 mg total) by mouth 2 (two) times daily. 180 tablet 3  . ranitidine (ZANTAC) 150 MG capsule Take 1 capsule (150 mg total) by mouth 2 (two) times daily. 60 capsule 0  . simvastatin (ZOCOR) 40 MG tablet Take 1 tablet (40 mg total) by mouth every evening. 90 tablet 3  . tamsulosin (FLOMAX) 0.4 MG CAPS capsule Take 2 capsules (0.8 mg total) by mouth daily. 90 capsule 1  . timolol (TIMOPTIC) 0.5 % ophthalmic solution Place 1 drop into the left eye daily. 10 mL 12  . traMADol (ULTRAM) 50 MG tablet 1 tab po q 8 hours prn pain 15 tablet 0  . triamcinolone ointment (KENALOG)  0.5 % Apply 1 application topically 2 (two) times daily. 30 g 0   No current facility-administered medications on file prior to visit.     BP 127/77   Pulse (!) 53   Temp 97.6 F (  36.4 C) (Oral)   Resp 16   Ht 5\' 6"  (1.676 m)   Wt 188 lb 6.4 oz (85.5 kg)   SpO2 100%   BMI 30.41 kg/m       Objective:   Physical Exam  General  Mental Status - Alert. General Appearance - Well groomed. Not in acute distress.  Skin Rashes- No Rashes.  HEENT Head- Normal. Ear Auditory Canal - Left- Normal. Right - Normal.Tympanic Membrane- Left- Normal. Right- Normal. Eye Sclera/Conjunctiva- Left- Normal. Right- Normal. Nose & Sinuses Nasal Mucosa- Left-  Boggy and Congested. Right-  Boggy and  Congested.Bilateral  No maxillary but faint frontal sinus pressure. Mouth & Throat Lips: Upper Lip- Normal: no dryness, cracking, pallor, cyanosis, or vesicular eruption. Lower Lip-Normal: no dryness, cracking, pallor, cyanosis or vesicular eruption. Buccal Mucosa- Bilateral- No Aphthous ulcers. Oropharynx- No Discharge or Erythema. +pnd. Tonsils: Characteristics- Bilateral- No Erythema or Congestion. Size/Enlargement- Bilateral- No enlargement. Discharge- bilateral-None.  Neck Neck- Supple. No Masses.   Chest and Lung Exam Auscultation: Breath Sounds:-Clear even and unlabored.  Cardiovascular Auscultation:Rythm- Regular, rate and rhythm. Murmurs & Other Heart Sounds:Ausculatation of the heart reveal- No Murmurs.  Lymphatic Head & Neck General Head & Neck Lymphatics: Bilateral: Description- No Localized lymphadenopathy.       Assessment & Plan:  You do appear to have recent allergic rhinitis flare. Prescription of Xyzal antihistamine sent to your pharmacy.  Discussion on use of nasal steroid today but you declined since nasal medications are irritating.  Decided on using low-dose Depo-Medrol to help relieve allergy symptoms quickly.  For cough, I prescribed benzonatate.  Some concern  today that productive cough indicates bronchitis.  So I went ahead and sent in a azithromycin antibiotic to your pharmacy.  If with the above medications your symptoms do not improve dramatically by Thursday then recommend starting antibiotic.  Follow-up in 7 days or as needed.  Note for patient's right lower rib/right upper quadrant region pain that is chronic in nature January I did not do a workup today.  Patient had negative chest x-ray with right rib series, negative abdomen ultrasound and a negative CT.  Did refill his diclofenac.  Also hoping for the short-term that he will get some response from Depo-Medrol in the event he has muscular type pain.  If pain worsens or changes then would revisit workup and consider additional testing.  Mackie Pai, PA-C

## 2017-10-06 ENCOUNTER — Ambulatory Visit (INDEPENDENT_AMBULATORY_CARE_PROVIDER_SITE_OTHER): Payer: MEDICARE | Admitting: Medical

## 2017-10-06 ENCOUNTER — Encounter: Payer: Self-pay | Admitting: Medical

## 2017-10-06 ENCOUNTER — Ambulatory Visit (HOSPITAL_BASED_OUTPATIENT_CLINIC_OR_DEPARTMENT_OTHER)
Admission: RE | Admit: 2017-10-06 | Discharge: 2017-10-06 | Disposition: A | Discharge status: Home / Self Care | Payer: MEDICARE | Source: Ambulatory Visit | Attending: Medical | Admitting: Medical

## 2017-10-06 VITALS — BP 127/80 | HR 60 | Temp 97.8°F | Resp 16 | Ht 66.0 in | Wt 183.8 lb

## 2017-10-06 DIAGNOSIS — M5442 Lumbago with sciatica, left side: Secondary | ICD-10-CM | POA: Diagnosis not present

## 2017-10-06 DIAGNOSIS — M5441 Lumbago with sciatica, right side: Secondary | ICD-10-CM

## 2017-10-06 DIAGNOSIS — G8929 Other chronic pain: Secondary | ICD-10-CM | POA: Diagnosis not present

## 2017-10-06 DIAGNOSIS — M542 Cervicalgia: Secondary | ICD-10-CM

## 2017-10-06 DIAGNOSIS — M47812 Spondylosis without myelopathy or radiculopathy, cervical region: Secondary | ICD-10-CM | POA: Diagnosis not present

## 2017-10-06 NOTE — Patient Instructions (Signed)
For your recent neck pain and lower lumbar pain, you can continue with diclofenac and tramadol.  You state that this is adequate for pain control presently.  If your pain worsens despite these medications let me know and I could try different regimen of medication.  Otherwise your plan of for the lumbar back surgery at the end of this month.  Regarding your new neck pain, I do want to get cervical spine x-ray to evaluate for any abnormality or neuroforaminal narrowing.  If pain worsens please let us know as might consider doing cervical spine MRI if necessary.  Follow-up in 10 to 14 days or as needed.

## 2017-10-06 NOTE — Progress Notes (Signed)
Subjective:    Patient ID: Brian [name], male    DOB: 11/25/1945, 72 y.o.   MRN: [id number]  HPI  Pt in for follow up.  He states he is going to have surgery on Oct 28, 2017 with Dr. Gerilyn Nestle.  Surgery is for  MRI shows lumbar spondylolisthesis, spondylosis and stenosis L4/5. Anterolisthesis appx 34mm on upright xray compared to 4 on supine MRI. These findings are consistent with the patient's symptoms.  The patient has failed non operative mgt. After a discussion of the potential risks and benefits of surgical intervention versus the potential risks and benefits of other reasonable treatment options, including observation, the patient has elected to proceed with a lumbar decompression and stabilization L4/5 .   Pt also describes new pain in his neck. When he goes through  range of motion exercises he will have pain. No fall trauma reported. Pain since Friday. No pain in his arms. Since this Friday. Also at times hand and forearm feels numb but no movement deficits.   He is not reporting any thoracic area pain to me.  Pt is satisfied with both diclofenac and tramadol. Controls pain.   Review of Systems  Constitutional: Negative for chills, fatigue and fever.  Respiratory: Negative for cough, chest tightness, shortness of breath and wheezing.   Cardiovascular: Negative for chest pain and palpitations.  Musculoskeletal: Positive for back pain and neck pain. Negative for arthralgias, joint swelling and neck stiffness.  Skin: Negative for rash.  Neurological: Negative for dizziness, weakness and headaches.       See hpi on left hand and forearm. Associated with new neck pain.  Hematological: Negative for adenopathy. Does not bruise/bleed easily.  Psychiatric/Behavioral: Negative for behavioral problems, decreased concentration and self-injury. The patient is not nervous/anxious.    Past Medical History:  Diagnosis Date  . Atypical chest pain     Negative cardiac catheterization 2014    . 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 Blind right eye   . 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 Chicken pox   . Chronic back pain   . Dislocation, jaw 1960s  . Edema    Left Leg  . Essential hypertension, benign   . GERD (gastroesophageal reflux disease)   . [place] measles   . Heart attack ([place])   . Hyperlipidemia   . Measles   . Mumps   . Osteopenia 11.12.10   bone density test  . Personal history of other diseases of digestive system 08.10.12   Gastric ulcer     Social History   Socioeconomic History  . Marital status: Single    Spouse name: Not on file  . Number of children: Not on file  . Years of education: Not on file  . Highest education level: Not on file  Occupational History  . Not on file  Social Needs  . Financial resource strain: Not on file  . Food insecurity:    Worry: Not on file    Inability: Not on file  . Transportation needs:    Medical: Not on file    Non-medical: Not on file  Tobacco Use  . Smoking status: Former Smoker    Packs/day: 0.25    Years: 2.00    Pack years: 0.50    Types: Cigarettes    Last attempt to quit: 06/02/1966    Years since quitting: 51.3  . Smokeless tobacco: Never Used  Substance and Sexual Activity  . Alcohol use: No    Alcohol/week:  0.0 oz  . Drug use: No  . Sexual activity: Never  Lifestyle  . Physical activity:    Days per week: Not on file    Minutes per session: Not on file  . Stress: Not on file  Relationships  . Social connections:    Talks on phone: Not on file    Gets together: Not on file    Attends religious service: Not on file    Active member of club or organization: Not on file    Attends meetings of clubs or organizations: Not on file    Relationship status: Not on file  . Intimate partner violence:    Fear of current or ex partner: Not on file    Emotionally abused: Not on file    Physically abused: Not on file    Forced sexual activity: Not on file  Other Topics  Concern  . Not on file  Social History Narrative  . Not on file    Past Surgical History:  Procedure Laterality Date  . ABDOMINAL HERNIA REPAIR  1972  . APPENDECTOMY  1973  . CATARACT EXTRACTION, BILATERAL  2005  . CHOLECYSTECTOMY  08/2016  . ESOPHAGOGASTRODUODENOSCOPY  06.2011  . EYE SURGERY  1972   Bilateral Lens Replacement  . FACIAL RECONSTRUCTION SURGERY  2008  . LEFT HEART CATH  02.17.2014  . [place], 2008   Dislocated Jaw  . PARS PLANA VITRECTOMY W/ REPAIR OF MACULAR HOLE  2005   macular hole repair, right eye  . PARS PLANA VITRECTOMY W/ REPAIR OF MACULAR HOLE  2010   mechanical, left eye  . PROSTATE BIOPSY  [place] 2017   w/ Dr. Nevada Crane  . WISDOM TOOTH EXTRACTION      Family History  Problem Relation Age of Onset  . Alzheimer's disease Mother        Deceased  . Diabetes Maternal Grandmother   . Heart attack Maternal Grandmother   . Prostate cancer Maternal Uncle   . Diabetes Maternal Uncle   . Healthy Son   . Cataracts Maternal Aunt     Allergies  Allergen Reactions  . Gabapentin Other (See Comments)    Feel "weird"; Nausea; Weakness; Syncope    Current Outpatient Medications on File Prior to Visit  Medication Sig Dispense Refill  . aspirin (ASPIR-81) 81 MG EC tablet Take 1 tablet by mouth daily.    Marland Kitchen azithromycin (ZITHROMAX) 250 MG tablet Take 2 tablets by mouth on day 1, followed by 1 tablet by mouth daily for 4 days. 6 tablet 0  . b complex vitamins tablet Take 1 tablet by mouth daily.    . benzonatate (TESSALON) 100 MG capsule Take 1 capsule (100 mg total) by mouth 3 (three) times daily as needed. 30 capsule 0  . Calcium Carbonate-Vitamin D (CALCIUM-VITAMIN D3 PO) Take by mouth daily.    . diclofenac (VOLTAREN) 75 MG EC tablet Take 1 tablet (75 mg total) by mouth 2 (two) times daily. 20 tablet 0  . famciclovir (FAMVIR) 500 MG tablet Take 1 tablet (500 mg total) by mouth 3 (three) times daily. 21 tablet 0  . hydrochlorothiazide (MICROZIDE)  12.5 MG capsule Take 1 capsule (12.5 mg total) by mouth daily. 90 capsule 3  . levocetirizine (XYZAL) 5 MG tablet Take 1 tablet (5 mg total) by mouth every evening. 30 tablet 0  . mirabegron ER (MYRBETRIQ) 50 MG TB24 tablet Take 50 mg by mouth daily.    . ondansetron (ZOFRAN ODT) 8 MG  disintegrating tablet Take 1 tablet (8 mg total) by mouth every 8 (eight) hours as needed for nausea or vomiting. 20 tablet 0  . pantoprazole (PROTONIX) 40 MG tablet Take 1 tablet (40 mg total) by mouth 2 (two) times daily. 180 tablet 3  . ranitidine (ZANTAC) 150 MG capsule Take 1 capsule (150 mg total) by mouth 2 (two) times daily. 60 capsule 0  . simvastatin (ZOCOR) 40 MG tablet Take 1 tablet (40 mg total) by mouth every evening. 90 tablet 3  . tamsulosin (FLOMAX) 0.4 MG CAPS capsule Take 2 capsules (0.8 mg total) by mouth daily. 90 capsule 1  . timolol (TIMOPTIC) 0.5 % ophthalmic solution Place 1 drop into the left eye daily. 10 mL 12  . traMADol (ULTRAM) 50 MG tablet 1 tab po q 8 hours prn pain 15 tablet 0  . triamcinolone ointment (KENALOG) 0.5 % Apply 1 application topically 2 (two) times daily. 30 g 0   No current facility-administered medications on file prior to visit.     BP 127/80   Pulse 60   Temp 97.8 F (36.6 C) (Oral)   Resp 16   Ht 5\' 6"  (1.676 m)   Wt 183 lb 12.8 oz (83.4 kg)   SpO2 98%   BMI 29.67 kg/m      Objective:   Physical Exam  General Appearance- Not in acute distress.  Neck- mild mid cervical spine tenderness. Upper portion. On turning head no radiating pain to arms.  Chest and Lung Exam Auscultation: Breath sounds:-Normal. Clear even and unlabored. Adventitious sounds:- No Adventitious sounds.  Cardiovascular Auscultation:Rythm - Regular, rate and rythm. Heart Sounds -Normal heart sounds.  Abdomen Inspection:-Inspection Normal.  Palpation/Perucssion: Palpation and Percussion of the abdomen reveal- Non Tender, No Rebound tenderness, No rigidity(Guarding) and No  Palpable abdominal masses.  Liver:-Normal.  Spleen:- Normal.   Back Mid lumbar spine tenderness to palpation. Pain on straight leg lift. Pain on lateral movements and flexion/extension of the spine.  Lower ext neurologic  L5-S1 sensation intact bilaterally. Normal patellar reflexes bilaterally. No foot drop bilaterally.  Upper ext- 5/5 equal and symmetric grip strength. Sharp and dull discrimination intact.      Assessment & Plan:  For your recent neck pain and lower lumbar pain, you can continue with diclofenac and tramadol.  You state that this is adequate for pain control presently.  If your pain worsens despite these medications let me know and I could try different regimen of medication.  Otherwise your plan of for the lumbar back surgery at the end of this month.  Regarding your new neck pain, I do want to get cervical spine x-ray to evaluate for any abnormality or neuroforaminal narrowing.  If pain worsens please let us know as might consider doing cervical spine MRI if necessary.  Follow-up in 10 to 14 days or as needed.  Mackie Pai, PA-C

## 2017-10-21 ENCOUNTER — Telehealth: Payer: Self-pay | Admitting: Medical

## 2017-10-21 NOTE — Telephone Encounter (Signed)
Faxed results requested.

## 2017-10-21 NOTE — Telephone Encounter (Signed)
Copied from [place] [id number]. Topic: Quick Communication - See Telephone Encounter >> Oct 21, 2017 10:01 AM Rutherford Nail, NT wrote: CRM for notification. See Telephone encounter for: 10/21/17. Cecille Rubin with [facility] [facility] Pre Admission department calling and states that they need the EKG results from 05/19/2017 and any other cardio testing that may have been done. CB#: [phone] Fax#: [phone] Attn: Cecille Rubin

## 2017-10-28 HISTORY — PX: BACK SURGERY: SHX140

## 2017-10-30 MED FILL — METHOCARBAMOL 500 MG TABLET: 500 | 20 days supply | Qty: 60 | Fill #0

## 2017-10-30 MED FILL — traMADol HCL 50 MG TABS: 50 | 7 days supply | Qty: 28 | Fill #0

## 2017-11-03 ENCOUNTER — Ambulatory Visit: Payer: Self-pay | Admitting: *Deleted

## 2017-11-03 ENCOUNTER — Telehealth: Payer: Self-pay | Admitting: Medical

## 2017-11-03 NOTE — Telephone Encounter (Signed)
Go ahead and give verbal order.

## 2017-11-03 NOTE — Telephone Encounter (Signed)
Verbal orders give

## 2017-11-03 NOTE — Telephone Encounter (Signed)
Copied from [id number]. Topic: Quick Communication - See Telephone Encounter >> Nov 03, 2017  8:27 AM Conception Chancy, NT wrote: CRM for notification. See Telephone encounter for: 11/03/17.  Juanda Bond is a physical therapist with [facility] and is requesting verbal orders for PT 1 week 2 and 2 week 4. Please contact.  Juanda Bond [phone]

## 2017-11-03 NOTE — Telephone Encounter (Signed)
Brian [name] for [facility] states PT is for pt's back. Pt recently had back surgery.

## 2017-11-03 NOTE — Telephone Encounter (Signed)
Patient is calling to request an appointment for a dressing change. He reports he is having more drainage than he was and wants Dr Harvie Heck to look at his incision and change the dressing. I did ask him about calling his surgeon and he does have a follow up- but he wants Dr Harvie Heck to look at it also. Patient states he has spinal surgery and he fell. His family has been helping him and changing the dressing- they state the incision is intact and he reports no redness or sign of infection. Patient states he gets SOB with exertion and he does have PT coming to his home. He reports that they were in the home today and they did check his BP and extremities. Patient states his L hand is sore where he caught himself when he fell. Appointment made with Dr Harvie Heck so patient can be directed to his surgeon and his breathing can be assessed.   Reason for Disposition . [1] MODERATE longstanding difficulty breathing (e.g., speaks in phrases, SOB even at rest, pulse 100-120) AND [2] SAME as normal  Answer Assessment - Initial Assessment Questions 1. RESPIRATORY STATUS: "Describe your breathing?" (e.g., wheezing, shortness of breath, unable to speak, severe coughing)      Short of breath 2. ONSET: "When did this breathing problem begin?"      Patient has SOB with exertion- more with the surgery 3. PATTERN "Does the difficult breathing come and go, or has it been constant since it started?"      Patient reports he can breath normally if he is sitting still- he has problems when he moves 4. SEVERITY: "How bad is your breathing?" (e.g., mild, moderate, severe)    - MILD: No SOB at rest, mild SOB with walking, speaks normally in sentences, can lay down, no retractions, pulse < 100.    - MODERATE: SOB at rest, SOB with minimal exertion and prefers to sit, cannot lie down flat, speaks in phrases, mild retractions, audible wheezing, pulse 100-120.    - SEVERE: Very SOB at rest, speaks in single words, struggling to  breathe, sitting hunched forward, retractions, pulse > 120      mild 5. RECURRENT SYMPTOM: "Have you had difficulty breathing before?" If so, ask: "When was the last time?" and "What happened that time?"      In hospital- patient has BP problems- patient is using diuretic 6. CARDIAC HISTORY: "Do you have any history of heart disease?" (e.g., heart attack, angina, bypass surgery, angioplasty)      no 7. LUNG HISTORY: "Do you have any history of lung disease?"  (e.g., pulmonary embolus, asthma, emphysema)     no 8. CAUSE: "What do you think is causing the breathing problem?"      Exertion from the surgery- spinal 9. OTHER SYMPTOMS: "Do you have any other symptoms? (e.g., dizziness, runny nose, cough, chest pain, fever)     Cough- not a lot- patient feels like he is choking when he coughs 10. PREGNANCY: "Is there any chance you are pregnant?" "When was your last menstrual period?"       n/a 11. TRAVEL: "Have you traveled out of the country in the last month?" (e.g., travel history, exposures)       n/a  Protocols used: BREATHING DIFFICULTY-A-AH

## 2017-11-03 NOTE — Telephone Encounter (Signed)
I will ok but first please get reason for PT. I am assuming it is for back pain? Can authorize until get reason??

## 2017-11-03 NOTE — Telephone Encounter (Signed)
FYI to PCP about upcoming appt.

## 2017-11-04 ENCOUNTER — Other Ambulatory Visit: Payer: Self-pay

## 2017-11-04 ENCOUNTER — Telehealth: Payer: Self-pay | Admitting: Medical

## 2017-11-04 ENCOUNTER — Ambulatory Visit (HOSPITAL_BASED_OUTPATIENT_CLINIC_OR_DEPARTMENT_OTHER)
Admission: RE | Admit: 2017-11-04 | Discharge: 2017-11-04 | Disposition: A | Discharge status: Home / Self Care | Payer: MEDICARE | Source: Ambulatory Visit | Attending: Medical | Admitting: Medical

## 2017-11-04 ENCOUNTER — Ambulatory Visit (INDEPENDENT_AMBULATORY_CARE_PROVIDER_SITE_OTHER): Payer: MEDICARE | Admitting: Medical

## 2017-11-04 ENCOUNTER — Encounter: Payer: Self-pay | Admitting: Medical

## 2017-11-04 VITALS — BP 131/57 | HR 73 | Temp 99.0°F | Resp 18 | Ht 64.0 in | Wt 186.0 lb

## 2017-11-04 DIAGNOSIS — R059 Cough, unspecified: Secondary | ICD-10-CM

## 2017-11-04 DIAGNOSIS — M545 Low back pain, unspecified: Secondary | ICD-10-CM

## 2017-11-04 DIAGNOSIS — M4326 Fusion of spine, lumbar region: Secondary | ICD-10-CM | POA: Insufficient documentation

## 2017-11-04 DIAGNOSIS — L0390 Cellulitis, unspecified: Secondary | ICD-10-CM

## 2017-11-04 DIAGNOSIS — R05 Cough: Secondary | ICD-10-CM

## 2017-11-04 DIAGNOSIS — R6883 Chills (without fever): Secondary | ICD-10-CM

## 2017-11-04 DIAGNOSIS — D649 Anemia, unspecified: Secondary | ICD-10-CM

## 2017-11-04 DIAGNOSIS — I700 Atherosclerosis of aorta: Secondary | ICD-10-CM | POA: Insufficient documentation

## 2017-11-04 DIAGNOSIS — W19XXXA Unspecified fall, initial encounter: Secondary | ICD-10-CM

## 2017-11-04 DIAGNOSIS — K5900 Constipation, unspecified: Secondary | ICD-10-CM | POA: Diagnosis not present

## 2017-11-04 DIAGNOSIS — R5383 Other fatigue: Secondary | ICD-10-CM

## 2017-11-04 DIAGNOSIS — J410 Simple chronic bronchitis: Secondary | ICD-10-CM | POA: Insufficient documentation

## 2017-11-04 LAB — COMPREHENSIVE METABOLIC PANEL
ALBUMIN: 3.5 g/dL (ref 3.5–5.2)
ALT: 43 U/L (ref 0–53)
AST: 38 U/L — AB (ref 0–37)
Alkaline Phosphatase: 55 U/L (ref 39–117)
BUN: 11 mg/dL (ref 6–23)
CHLORIDE: 100 meq/L (ref 96–112)
CO2: 32 mEq/L (ref 19–32)
CREATININE: 0.93 mg/dL (ref 0.40–1.50)
Calcium: 9.2 mg/dL (ref 8.4–10.5)
GFR: 102.16 mL/min (ref >60.00)
GLUCOSE: 115 mg/dL — AB (ref 70–99)
Potassium: 5 mEq/L (ref 3.5–5.1)
SODIUM: 139 meq/L (ref 135–145)
Total Bilirubin: 1 mg/dL (ref 0.2–1.2)
Total Protein: 6.3 g/dL (ref 6.0–8.3)

## 2017-11-04 LAB — CBC WITH DIFFERENTIAL/PLATELET
BASOS PCT: 0.9 % (ref 0.0–3.0)
Basophils Absolute: 0.1 10*3/uL (ref 0.0–0.1)
Eosinophils Absolute: 0.3 10*3/uL (ref 0.0–0.7)
Eosinophils Relative: 4.3 % (ref 0.0–5.0)
HCT: 33.7 % — ABNORMAL LOW (ref 39.0–52.0)
HEMOGLOBIN: 11.2 g/dL — AB (ref 13.0–17.0)
LYMPHS ABS: 1.5 10*3/uL (ref 0.7–4.0)
Lymphocytes Relative: 23.3 % (ref 12.0–46.0)
MCHC: 33.3 g/dL (ref 30.0–36.0)
MCV: 85.5 fl (ref 78.0–100.0)
MONO ABS: 0.8 10*3/uL (ref 0.1–1.0)
Monocytes Relative: 13 % — ABNORMAL HIGH (ref 3.0–12.0)
NEUTROS PCT: 58.5 % (ref 43.0–77.0)
Neutro Abs: 3.8 10*3/uL (ref 1.4–7.7)
Platelets: 453 10*3/uL — ABNORMAL HIGH (ref 150.0–400.0)
RBC: 3.94 Mil/uL — AB (ref 4.22–5.81)
RDW: 13.3 % (ref 11.5–15.5)
WBC: 6.4 10*3/uL (ref 4.0–10.5)

## 2017-11-04 MED ORDER — DOXYCYCLINE HYCLATE 100 MG PO TABS: 100.0000 mg | ORAL_TABLET | Freq: Two times a day (BID) | ORAL | 0 refills | Status: DC

## 2017-11-04 NOTE — Patient Instructions (Addendum)
For your acute midline low back pain post surgery with recent fall, I decided to get lumbar spine x-ray.  Nurse did redress the area.  Keep your appointment with surgeon in 1 week.  If you have any changes to the wound such as bleeding, discharge or more pain let me know and I would try to get you in with surgeon sooner.  You declined diclofenac for pain today.  You mentioned you have some at home and I would advise using that if needed.  Will avoid any narcotic due to side effects.  For recent cough and chills, decided to get a chest x-ray and CBC.  For fatigue added on CMP.  Also will get a wound culture to make sure no growth significant bacteria on one.  Regarding her recent constipation, recommend that you hydrate well.  If you do get re-constipated again as before then you could use the same regimen that he used to induce bowel movement.  I am going to make doxycycline print prescription available in the event your blood work and chest x-ray indicate probable infection.  Also you can take doxycycline if your symptoms clinically worsen despite normal work-up today.   Follow-up in 7 to 10 days or as needed.

## 2017-11-04 NOTE — Telephone Encounter (Signed)
Future stat cbc placed. 

## 2017-11-04 NOTE — Progress Notes (Signed)
Subjective:    Patient ID: Brian [name], male    DOB: [birth year], 72 y.o.   MRN: [id number]  HPI   Patient in today for evaluation.  He reports that he had back surgery TLIF of his L4-L5 spine last Wednesday.  He was doing okay but then he lost his balance on Saturday and fell.  He describes falling on his left knee and then breaking his fall with his left upper extremity/hand(caught himself).  He is not complaining any significant pain to these areas.  He does report that his/surgical site region is a little bit more sore than before the fall.  No severe bleeding reported but his bandages does have some dried blood.  No yellow discharge reported.  He does report over the last 3 to 4 days some intermittent feeling of chills but no sweats.  He reports feeling feverish but temperature this morning was 98.1.  He has a dry occasional cough recently.  Also he reports some constipation since surgery last Wednesday.  He described not having any BM for 6 days.  He notified surgeon office yesterday and they were advised Colace 100 mg twice daily and MiraLAX.  This did not help.  Then he tried magnesium citrate and describe moderate bowel movement.  Not reporting any abdomen pain.     Review of Systems  Constitutional: Positive for chills, fatigue and fever.  HENT: Negative for congestion.   Respiratory: Positive for cough. Negative for shortness of breath and wheezing.   Cardiovascular: Negative for chest pain and palpitations.  Gastrointestinal: Negative for abdominal distention, abdominal pain, anal bleeding, constipation, diarrhea, nausea and vomiting.       Recent constipation  Musculoskeletal: Positive for back pain.  Skin: Negative for rash.  Neurological: Negative for dizziness and light-headedness.  Hematological: Negative for adenopathy. Does not bruise/bleed easily.  Psychiatric/Behavioral: Negative for behavioral problems.    Past Medical History:  Diagnosis Date  . Atypical  chest pain     Negative cardiac catheterization 2014  . 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 Blind right eye   . 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 Chicken pox   . Chronic back pain   . Dislocation, jaw 1960s  . Edema    Left Leg  . Essential hypertension, benign   . GERD (gastroesophageal reflux disease)   . [place] measles   . Heart attack ([place])   . Hyperlipidemia   . Measles   . Mumps   . Osteopenia 11.12.10   bone density test  . Personal history of other diseases of digestive system 08.10.12   Gastric ulcer     Social History   Socioeconomic History  . Marital status: Single    Spouse name: Not on file  . Number of children: Not on file  . Years of education: Not on file  . Highest education level: Not on file  Occupational History  . Not on file  Social Needs  . Financial resource strain: Not on file  . Food insecurity:    Worry: Not on file    Inability: Not on file  . Transportation needs:    Medical: Not on file    Non-medical: Not on file  Tobacco Use  . Smoking status: Former Smoker    Packs/day: 0.25    Years: 2.00    Pack years: 0.50    Types: Cigarettes    Last attempt to quit: 06/02/1966    Years since quitting: 51.4  . Smokeless tobacco:  Never Used  Substance and Sexual Activity  . Alcohol use: No    Alcohol/week: 0.0 oz  . Drug use: No  . Sexual activity: Never  Lifestyle  . Physical activity:    Days per week: Not on file    Minutes per session: Not on file  . Stress: Not on file  Relationships  . Social connections:    Talks on phone: Not on file    Gets together: Not on file    Attends religious service: Not on file    Active member of club or organization: Not on file    Attends meetings of clubs or organizations: Not on file    Relationship status: Not on file  . Intimate partner violence:    Fear of current or ex partner: Not on file    Emotionally abused: Not on file    Physically abused: Not on file     Forced sexual activity: Not on file  Other Topics Concern  . Not on file  Social History Narrative  . Not on file    Past Surgical History:  Procedure Laterality Date  . ABDOMINAL HERNIA REPAIR  1972  . APPENDECTOMY  1973  . CATARACT EXTRACTION, BILATERAL  2005  . CHOLECYSTECTOMY  08/2016  . ESOPHAGOGASTRODUODENOSCOPY  06.2011  . EYE SURGERY  1972   Bilateral Lens Replacement  . FACIAL RECONSTRUCTION SURGERY  2008  . LEFT HEART CATH  02.17.2014  . [place], 2008   Dislocated Jaw  . PARS PLANA VITRECTOMY W/ REPAIR OF MACULAR HOLE  2005   macular hole repair, right eye  . PARS PLANA VITRECTOMY W/ REPAIR OF MACULAR HOLE  2010   mechanical, left eye  . PROSTATE BIOPSY  [place] 2017   w/ Dr. [place] Crane  . WISDOM TOOTH EXTRACTION      Family History  Problem Relation Age of Onset  . Alzheimer's disease Mother        Deceased  . Diabetes Maternal Grandmother   . Heart attack Maternal Grandmother   . Prostate cancer Maternal Uncle   . Diabetes Maternal Uncle   . Healthy Son   . Cataracts Maternal Aunt     Allergies  Allergen Reactions  . Gabapentin Other (See Comments)    Feel "weird"; Nausea; Weakness; Syncope    Current Outpatient Medications on File Prior to Visit  Medication Sig Dispense Refill  . aspirin (ASPIR-81) 81 MG EC tablet Take 1 tablet by mouth daily.    Marland Kitchen b complex vitamins tablet Take 1 tablet by mouth daily.    . benzonatate (TESSALON) 100 MG capsule Take 1 capsule (100 mg total) by mouth 3 (three) times daily as needed. 30 capsule 0  . Calcium Carbonate-Vitamin D (CALCIUM-VITAMIN D3 PO) Take by mouth daily.    . diclofenac (VOLTAREN) 75 MG EC tablet Take 1 tablet (75 mg total) by mouth 2 (two) times daily. 20 tablet 0  . hydrochlorothiazide (MICROZIDE) 12.5 MG capsule Take 1 capsule (12.5 mg total) by mouth daily. 90 capsule 3  . levocetirizine (XYZAL) 5 MG tablet Take 1 tablet (5 mg total) by mouth every evening. 30 tablet 0  . methocarbamol  (ROBAXIN) 500 MG tablet Take 500 mg by mouth 3 (three) times daily as needed.    . mirabegron ER (MYRBETRIQ) 50 MG TB24 tablet Take 50 mg by mouth daily.    . ondansetron (ZOFRAN ODT) 8 MG disintegrating tablet Take 1 tablet (8 mg total) by mouth every 8 (eight) hours  as needed for nausea or vomiting. 20 tablet 0  . pantoprazole (PROTONIX) 40 MG tablet Take 1 tablet (40 mg total) by mouth 2 (two) times daily. 180 tablet 3  . simvastatin (ZOCOR) 40 MG tablet Take 1 tablet (40 mg total) by mouth every evening. 90 tablet 3  . tamsulosin (FLOMAX) 0.4 MG CAPS capsule Take 2 capsules (0.8 mg total) by mouth daily. 90 capsule 1  . timolol (TIMOPTIC) 0.5 % ophthalmic solution Place 1 drop into the left eye daily. 10 mL 12  . traMADol (ULTRAM) 50 MG tablet 1 tab po q 8 hours prn pain 15 tablet 0  . triamcinolone ointment (KENALOG) 0.5 % Apply 1 application topically 2 (two) times daily. 30 g 0   No current facility-administered medications on file prior to visit.     BP (!) 131/57 (BP Location: Left Arm, Patient Position: Sitting, Cuff Size: Normal)   Pulse 73   Temp 99 F (37.2 C) (Oral)   Resp 18   Ht 5\' 4"  (1.626 m)   Wt 186 lb (84.4 kg)   SpO2 92%   BMI 31.93 kg/m       Objective:   Physical Exam  General  Mental Status - Alert. General Appearance - Well groomed. Not in acute distress.  Skin Rashes- No Rashes.    Neck Neck- Supple. No Masses.   Chest and Lung Exam Auscultation: Breath Sounds:-Clear even and unlabored.  Cardiovascular Auscultation:Rythm- Regular, rate and rhythm. Murmurs & Other Heart Sounds:Ausculatation of the heart reveal- No Murmurs.  Lymphatic Head & Neck General Head & Neck Lymphatics: Bilateral: Description- No Localized lymphadenopathy.    Abdomen Inspection:-Inspection Normal.  Palpation/Perucssion: Palpation and Percussion of the abdomen reveal- Non Tender, No Rebound tenderness, No rigidity(Guarding) and No Palpable abdominal masses.    Liver:-Normal.  Spleen:- Normal.   Back- midline lumbar spine he has staples in place.  The wound looks like it is healing appropriately.  There is no surrounding redness, edema or fluctuance.  The wound is dry.  No discharge.  The gauze covering the wound does have a slightly dried soaked in blood appearance.  Left hand- good range of motion.(no significant pain) Left knee- good range of motion(no significant pain)      Assessment & Plan:  For your acute midline low back pain post surgery with recent fall, I decided to get lumbar spine x-ray.  Nurse did redress the area.  Keep your appointment with surgeon in 1 week.  If you have any changes to the wound such as bleeding, discharge or more pain let me know and I would try to get you in with surgeon sooner.  You declined diclofenac for pain today.  You mentioned you have some at home and I would advise using that if needed.  Will avoid any narcotic due to side effects.  For recent cough and chills, decided to get a chest x-ray and CBC.  For fatigue added on CMP.  Also will get a wound culture to make sure no growth significant bacteria on one.  Regarding her recent constipation, recommend that you hydrate well.  If you do get re-constipated again as before then you could use the same regimen that he used to induce bowel movement.  I am going to make doxycycline print prescription available in the event your blood work and chest x-ray indicate probable infection.  Also you can take doxycycline if your symptoms clinically worsen despite normal work-up today.  Follow-up in 7 to 10 days or  as needed.   Mackie Pai, PA-C

## 2017-11-04 NOTE — Progress Notes (Signed)
Pt here for f/u back surgery last week with s/p fall with walker while at home getting out of bed on 6/3. Pt. states his lower back pain has not increased since the fall, but still severe while on tramadol and methocarbamol. He endorses having chills and temps ranging from 99 to 101F at home. Pt.states he has been constipated as well, which may be contributing to his back pain. Pt. took magnesium citrate yesterday with positive results, but still feels constipated; normally has 2-3 BM everyday pre-surgery. Percell Miller, PA to assess

## 2017-11-06 ENCOUNTER — Other Ambulatory Visit (INDEPENDENT_AMBULATORY_CARE_PROVIDER_SITE_OTHER): Payer: MEDICARE

## 2017-11-06 DIAGNOSIS — D649 Anemia, unspecified: Secondary | ICD-10-CM | POA: Diagnosis not present

## 2017-11-06 LAB — CBC WITH DIFFERENTIAL/PLATELET
BASOS ABS: 0.1 10*3/uL (ref 0.0–0.1)
Basophils Relative: 1.2 % (ref 0.0–3.0)
Eosinophils Absolute: 0.2 10*3/uL (ref 0.0–0.7)
Eosinophils Relative: 3.8 % (ref 0.0–5.0)
HCT: 33.8 % — ABNORMAL LOW (ref 39.0–52.0)
Hemoglobin: 11 g/dL — ABNORMAL LOW (ref 13.0–17.0)
LYMPHS ABS: 1.3 10*3/uL (ref 0.7–4.0)
Lymphocytes Relative: 22.2 % (ref 12.0–46.0)
MCHC: 32.4 g/dL (ref 30.0–36.0)
MCV: 86 fl (ref 78.0–100.0)
MONO ABS: 0.9 10*3/uL (ref 0.1–1.0)
Monocytes Relative: 15.3 % — ABNORMAL HIGH (ref 3.0–12.0)
NEUTROS ABS: 3.3 10*3/uL (ref 1.4–7.7)
NEUTROS PCT: 57.5 % (ref 43.0–77.0)
PLATELETS: 463 10*3/uL — AB (ref 150.0–400.0)
RBC: 3.93 Mil/uL — ABNORMAL LOW (ref 4.22–5.81)
RDW: 13.4 % (ref 11.5–15.5)
WBC: 5.7 10*3/uL (ref 4.0–10.5)

## 2017-11-06 MED FILL — DOXYCYCLINE HYCLATE 100 MG: 100 | 10 days supply | Qty: 20 | Fill #0

## 2017-11-07 LAB — WOUND CULTURE
MICRO NUMBER:: 90676247
RESULT: NO GROWTH
SPECIMEN QUALITY: ADEQUATE

## 2017-11-09 ENCOUNTER — Telehealth: Payer: Self-pay | Admitting: *Deleted

## 2017-11-09 NOTE — Telephone Encounter (Signed)
Received Physician Orders from [facility]; forwarded to provider/SLS 06/10  

## 2017-11-11 ENCOUNTER — Telehealth: Payer: Self-pay | Admitting: Medical

## 2017-11-11 NOTE — Telephone Encounter (Signed)
Copied from [place] [phone]. Topic: Inquiry >> Nov 11, 2017 10:19 AM Lennox Solders wrote: Reason for CRM: Ronalee Belts OT from [facility] is requesting two more visit once a wk for 2 wks for bathroom and bedroom equipment training

## 2017-11-11 NOTE — Telephone Encounter (Signed)
Verbal given 

## 2017-11-12 MED FILL — DOXEPIN HCL 10 MG CAPS: 10 | 10 days supply | Qty: 10 | Fill #0

## 2017-11-13 ENCOUNTER — Telehealth: Payer: Self-pay | Admitting: *Deleted

## 2017-11-13 NOTE — Telephone Encounter (Signed)
Received Physician Orders from [facility]; forwarded to provider/SLS 06/14

## 2017-11-13 NOTE — Telephone Encounter (Signed)
Received Home Health Certification and Plan of Care; forwarded to provider/SLS 06/14

## 2017-11-16 ENCOUNTER — Telehealth: Payer: Self-pay | Admitting: Medical

## 2017-11-16 MED FILL — traMADol HCL 50 MG TABS: 50 | 7 days supply | Qty: 28 | Fill #1

## 2017-11-16 MED FILL — DOXYCYCLINE HYCLATE 100 MG: 100 | 10 days supply | Qty: 20 | Fill #0

## 2017-11-16 NOTE — Telephone Encounter (Signed)
Gave signed pt, ot,  And home health orders to sharon to fax over.

## 2017-12-08 ENCOUNTER — Other Ambulatory Visit: Payer: Self-pay

## 2017-12-08 ENCOUNTER — Ambulatory Visit: Payer: MEDICARE | Attending: Neurosurgery | Admitting: Physical Therapy

## 2017-12-08 DIAGNOSIS — G8929 Other chronic pain: Secondary | ICD-10-CM

## 2017-12-08 DIAGNOSIS — R2689 Other abnormalities of gait and mobility: Secondary | ICD-10-CM | POA: Diagnosis present

## 2017-12-08 DIAGNOSIS — M6281 Muscle weakness (generalized): Secondary | ICD-10-CM | POA: Diagnosis present

## 2017-12-08 DIAGNOSIS — M545 Low back pain, unspecified: Secondary | ICD-10-CM

## 2017-12-08 NOTE — Therapy (Signed)
[facility] [address]  [place], [place], [address] Phone: [phone]   Fax:  [phone]  Physical Therapy Evaluation  Patient Details  Name: Brian [name] MRN: [id number] Date of Birth: 1945/08/26 Referring Provider: Bonnee Quin, PA-C   Encounter Date: 12/08/2017  PT End of Session - 12/08/17 0846    Visit Number  1    Number of Visits  12    Date for PT Re-Evaluation  01/19/18    PT Start Time  0845    PT Stop Time  0929    PT Time Calculation (min)  44 min    Equipment Utilized During Treatment  Other (comment) Lumbar brace    Behavior During Therapy  [facility] for tasks assessed/performed       Past Medical History:  Diagnosis Date  . Atypical chest pain     Negative cardiac catheterization 2014  . 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 Blind right eye   . 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 Chicken pox   . Chronic back pain   . Dislocation, jaw 1960s  . Edema    Left Leg  . Essential hypertension, benign   . GERD (gastroesophageal reflux disease)   . [place] measles   . Heart attack ([place])   . Hyperlipidemia   . Measles   . Mumps   . Osteopenia 11.12.10   bone density test  . Personal history of other diseases of digestive system 08.10.12   Gastric ulcer    Past Surgical History:  Procedure Laterality Date  . ABDOMINAL HERNIA REPAIR  1972  . APPENDECTOMY  1973  . CATARACT EXTRACTION, BILATERAL  2005  . CHOLECYSTECTOMY  08/2016  . ESOPHAGOGASTRODUODENOSCOPY  06.2011  . EYE SURGERY  1972   Bilateral Lens Replacement  . FACIAL RECONSTRUCTION SURGERY  2008  . LEFT HEART CATH  02.17.2014  . [place], 2008   Dislocated Jaw  . PARS PLANA VITRECTOMY W/ REPAIR OF MACULAR HOLE  2005   macular hole repair, right eye  . PARS PLANA VITRECTOMY W/ REPAIR OF MACULAR HOLE  2010   mechanical, left eye  . PROSTATE BIOPSY  [place] 2017   w/ Dr. Nevada Crane  . WISDOM TOOTH EXTRACTION       There were no vitals filed for this visit.   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 When lifting one day felt something in his back and was told he had a slipped disc, and ever since then has had back pain. Has been walking 30 min 3 times/day to recovery from surgery around his block. 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 Pt reports no numbness/tingling    Pertinent History  Lumbar     Limitations  Lifting;Sitting;Standing    How long can you sit comfortably?  10 min    How long can you stand comfortably?  10 min    How long can you walk comfortably?  No limitation    Patient Stated Goals  to decrease pain/be comfortable    Currently in Pain?  No/denies    Pain Score  0-No pain 4; sometimes occurs with standing    Pain Location  Back Sometimes into the front of the thigh    Pain Orientation  Right;Left;Posterior    Pain Descriptors / Indicators  [place];Hervey Ard  [facility] PT Assessment - 12/08/17 0001      Assessment   Medical Diagnosis  Spondylolisthesis of lumbar region; subsequent TLIF L4-L5    Referring Provider  Bonnee Quin, PA-C    Onset Date/Surgical Date  10/28/17    Next MD Visit  01/26/2018    Prior Therapy  Yes      Precautions   Precautions  Back    Precaution Comments  No BLT, no lifting greater than 5 lbs      Balance Screen   Has the patient fallen in the past 6 months  Yes When sitting up in bed in hospital after surgery    How many times?  1    Has the patient had a decrease in activity level because of a fear of falling?   No    Is the patient reluctant to leave their home because of a fear of falling?   No      Home Environment   Living Environment  Private residence    Type of [place] Access  Stairs to enter    Entrance Stairs-Number of Steps  1    Home Layout  Two level    Alternate Level Stairs-Number of  Steps  [place] bars - tub/shower;Bedside commode      Prior Function   Level of Independence  Independent    Vocation  Retired    Leisure  Provides care for 7 y/o cousin with osteogenesis imperfecta      Observation/Other Assessments   Focus on Therapeutic Outcomes (FOTO)   Lumbar - 59% status (41% limited); Predicted 69% (31% limited)      Posture/Postural Control   Posture/Postural Control  Postural limitations    Postural Limitations  Decreased lumbar lordosis;Increased thoracic kyphosis;Posterior pelvic tilt      AROM   Overall AROM Comments  Not assessed today secondary to lumbar precautions      Strength   Right Hip Flexion  5/5    Right Hip Extension  4/5    Right Hip ABduction  3+/5    Right Hip ADduction  4-/5    Left Hip Flexion  5/5    Left Hip Extension  3/5    Left Hip ABduction  3/5    Left Hip ADduction  2+/5    Right Knee Flexion  4+/5    Right Knee Extension  5/5    Left Knee Flexion  4-/5    Left Knee Extension  5/5      Palpation   Palpation comment  TTP along bilateral iliac crest and R flank. No TTP over lumbar or glute regoin. Scar mobility was good and appeared to be healing well.       Ambulation/Gait   Ambulation/Gait  Yes    Ambulation Distance (Feet)  60 Feet    Assistive device  None    Gait Pattern  Decreased arm swing - right;Decreased arm swing - left;Lateral trunk lean to left;Decreased trunk rotation    Ambulation Surface  Level;Indoor    Gait Comments  Assessed while pt wearing lumbar brace                Objective measurements completed on examination: See above findings.      [place] Adult PT Treatment/Exercise - 12/08/17 0001      Knee/Hip Exercises: Standing   Hip Abduction  Both;10 reps;Stengthening    Abduction Limitations  yellow TB around knees;  Side stepping walking length of counter    Hip Extension  10 reps;Stengthening;Both;Knee bent    Extension Limitations  Yellow TB around knees              PT Education - 12/08/17 1120    Education Details  Pt educated on results of examination, POC, and HEP    Person(s) Educated  Patient    Methods  Explanation;Demonstration    Comprehension  Verbalized understanding;Returned demonstration       PT Short Term Goals - 12/08/17 1052      PT SHORT TERM GOAL #1   Title  Pt will be independent with initial HEP    Status  New    Target Date  12/22/17        PT Long Term Goals - 12/08/17 1122      PT LONG TERM GOAL #1   Title  Pt will be independent with advanced HEP    Status  New    Target Date  01/19/18      PT LONG TERM GOAL #2   Title  Pt to improve gross bilat LE strength to 4/5 to improve support to lumbar structures/musculature    Status  New    Target Date  01/19/18      PT LONG TERM GOAL #3   Title  Pt will be able to perform 10 sit to stands without increased pain    Status  New    Target Date  01/19/18      PT LONG TERM GOAL #4   Title  Pt will report ability to stand for greater than 30 min to improve ability to perform ADLs and care for family members    Status  New    Target Date  01/19/18             Plan - 12/08/17 1154    Clinical Impression Statement  Pt is a 72 y/o M who presents to OP PT secondary to L4-L5 spondylolisthesis with surgical fixation (L4-L5 fusion). Examination revealed decreased LE strength, pain with functional activity including standing from a sitting position, and gait abnormalities including R trunk lean and decreased arm swing. Pt prognosis is positively impacted by intrinsic motivation and good prior response to PT. Prognosis is negatively impacted by the chronicity of the condition.     History and Personal Factors relevant to plan of care:  Back precautions, wears brace during all activity    Clinical Presentation  Stable    Clinical Decision Making  Low    Rehab Potential  Good    Clinical Impairments Affecting Rehab Potential  Hearing difficulty, R eye visual  deficits     PT Frequency  2x / week    PT Duration  6 weeks    PT Treatment/Interventions  ADLs/Self Care Home Management;Electrical Stimulation;Iontophoresis 4mg /ml Dexamethasone;Therapeutic activities;Moist Heat;Therapeutic exercise;Functional mobility training;Stair training;Gait training;Neuromuscular re-education;Patient/family education;Manual techniques;Dry needling;Splinting;Taping;Scar mobilization;Manual lymph drainage       Patient will benefit from skilled therapeutic intervention in order to improve the following deficits and impairments:  Abnormal gait, Decreased activity tolerance, Decreased mobility, Decreased range of motion, Decreased strength, Pain, Improper body mechanics, Postural dysfunction, Impaired flexibility, Hypomobility  Visit Diagnosis: Chronic bilateral low back pain without sciatica  Muscle weakness (generalized)  Other abnormalities of gait and mobility     Problem List Patient Active Problem List   Diagnosis Date Noted  . Right shoulder pain 01/30/2017  . Left shoulder pain 05/19/2016  . Urinary hesitancy 10/18/2014  .  Trapezius muscle spasm 10/18/2014  . Idiopathic neuropathy 10/18/2014  . Allergic reaction 09/28/2014  . Hyperglycemia 09/28/2014  . Acute bacterial bronchitis 08/18/2014  . Hematuria 08/18/2014  . Screening, ischemic heart disease 06/16/2014  . Lumbar radiculopathy, chronic 05/19/2014  . Midline thoracic back pain 03/24/2014  . BPPV (benign paroxysmal positional vertigo) 01/20/2014  . Right-sided low back pain with right-sided sciatica 12/21/2013  . Lightheadedness 09/22/2013  . Porokeratosis 02/25/2013  . Pain in joint, ankle and foot 02/25/2013  . Lumbosacral pain 02/15/2013  . Abdominal pain, epigastric 02/15/2013  . Acid reflux 02/15/2013  . Hearing loss 02/15/2013  . Preventive measure 02/15/2013  . Chest pain 07/19/2012  . Bradycardia, sinus 07/19/2012  . Borderline glaucoma 03/15/2012  . 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 Dislocated inferior maxilla 01/10/2011  . H/O gastric ulcer 01/10/2011  . Back pain, chronic 11/21/2010    Shirline Frees, SPT 12/08/2017, 6:09 PM  [facility] [address]  [place], [place], [address] Phone: [phone]   Fax:  [phone]  Name: Brian [name] MRN: [id number] Date of Birth: 21-Sep-1945

## 2017-12-09 ENCOUNTER — Ambulatory Visit (HOSPITAL_BASED_OUTPATIENT_CLINIC_OR_DEPARTMENT_OTHER)
Admission: RE | Admit: 2017-12-09 | Discharge: 2017-12-09 | Disposition: A | Discharge status: Home / Self Care | Payer: MEDICARE | Source: Ambulatory Visit | Attending: Medical | Admitting: Medical

## 2017-12-09 ENCOUNTER — Ambulatory Visit (INDEPENDENT_AMBULATORY_CARE_PROVIDER_SITE_OTHER): Payer: MEDICARE | Admitting: Medical

## 2017-12-09 ENCOUNTER — Encounter (HOSPITAL_BASED_OUTPATIENT_CLINIC_OR_DEPARTMENT_OTHER): Payer: Self-pay

## 2017-12-09 ENCOUNTER — Encounter: Payer: Self-pay | Admitting: Medical

## 2017-12-09 VITALS — BP 121/77 | HR 74 | Temp 98.2°F | Resp 16 | Ht 64.0 in | Wt 180.2 lb

## 2017-12-09 DIAGNOSIS — M25551 Pain in right hip: Secondary | ICD-10-CM | POA: Diagnosis not present

## 2017-12-09 DIAGNOSIS — M25552 Pain in left hip: Secondary | ICD-10-CM

## 2017-12-09 DIAGNOSIS — N50811 Right testicular pain: Secondary | ICD-10-CM | POA: Insufficient documentation

## 2017-12-09 DIAGNOSIS — N50812 Left testicular pain: Secondary | ICD-10-CM | POA: Diagnosis not present

## 2017-12-09 DIAGNOSIS — R1031 Right lower quadrant pain: Secondary | ICD-10-CM

## 2017-12-09 DIAGNOSIS — N50819 Testicular pain, unspecified: Secondary | ICD-10-CM

## 2017-12-09 DIAGNOSIS — R972 Elevated prostate specific antigen [PSA]: Secondary | ICD-10-CM | POA: Diagnosis not present

## 2017-12-09 DIAGNOSIS — M1612 Unilateral primary osteoarthritis, left hip: Secondary | ICD-10-CM | POA: Diagnosis not present

## 2017-12-09 DIAGNOSIS — R102 Pelvic and perineal pain: Secondary | ICD-10-CM

## 2017-12-09 DIAGNOSIS — I861 Scrotal varices: Secondary | ICD-10-CM | POA: Insufficient documentation

## 2017-12-09 DIAGNOSIS — R1032 Left lower quadrant pain: Secondary | ICD-10-CM

## 2017-12-09 DIAGNOSIS — R1030 Lower abdominal pain, unspecified: Secondary | ICD-10-CM

## 2017-12-09 LAB — POC URINALSYSI DIPSTICK (AUTOMATED)
Bilirubin, UA: NEGATIVE
Blood, UA: NEGATIVE
Glucose, UA: NEGATIVE
Ketones, UA: NEGATIVE
LEUKOCYTES UA: NEGATIVE
NITRITE UA: NEGATIVE
PH UA: 6 (ref 5.0–8.0)
Protein, UA: NEGATIVE
Spec Grav, UA: 1.015 (ref 1.010–1.025)
UROBILINOGEN UA: NEGATIVE U/dL — AB

## 2017-12-09 LAB — PSA: PSA: 0.87 ng/mL (ref 0.10–4.00)

## 2017-12-09 MED ORDER — CIPROFLOXACIN HCL 500 MG PO TABS: 500.0000 mg | ORAL_TABLET | Freq: Two times a day (BID) | ORAL | 0 refills | Status: DC

## 2017-12-09 MED FILL — CIPROFLOXACIN HCL 500 MG TA: 500 | 10 days supply | Qty: 20 | Fill #0

## 2017-12-09 NOTE — Patient Instructions (Signed)
For your recent bilateral testicle pain and suprapubic region pain, I placed order for UA, urine culture, and PSA.  On exam it appears you have probable epididymitis.  I went ahead and prescribed ciprofloxacin antibiotic.  Would recommend that you use the diclofenac for the pain.    For your left hip region pain, I did place x-ray order for your hip.  Diclofenac may help with this pain as well.  Note no hernia found on either side/inguinal canal region.  Please go down and confirm your ultrasound appointment for 130 this afternoon.  Follow-up in 7 to 10 days or as needed.

## 2017-12-09 NOTE — Progress Notes (Signed)
Subjective:    Patient ID: Brian [name], male    DOB: Dec 02, 1945, 72 y.o.   MRN: [id number]  HPI  Pt in for follow up.  Pt describing bilateral hip region pain, groin region pain into testicle region. This pain been present for last couple of days. When walks he does not note hip pain. Notices at rest.  No pain on urination.  But does report some suprapubic pain. Hx of mild increased prostate velocity. Pt also notes that he is going to have cystocopy with urologist in December.   Pt also note rt knee last couple of days. Anterior aspect has described deep itch. No fall or trauma. No rash to skin.    Review of Systems  Constitutional: Negative for chills, fatigue and fever.  Respiratory: Negative for cough, chest tightness, shortness of breath and wheezing.   Cardiovascular: Negative for chest pain and palpitations.  Gastrointestinal: Negative for abdominal pain.  Genitourinary: Positive for testicular pain. Negative for decreased urine volume, difficulty urinating, dysuria, flank pain, frequency, genital sores, hematuria, penile pain and urgency.       Some faint testicle tendernes on and off when he touches them.  Musculoskeletal: Negative for back pain.  Skin: Negative for rash.  Neurological: Negative for dizziness, seizures, speech difficulty, weakness and light-headedness.  Hematological: Negative for adenopathy.  Psychiatric/Behavioral: Negative for behavioral problems and confusion.   Past Medical History:  Diagnosis Date  . Atypical chest pain     Negative cardiac catheterization 2014  . 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 Blind right eye   . 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 Chicken pox   . Chronic back pain   . Dislocation, jaw 1960s  . Edema    Left Leg  . Essential hypertension, benign   . GERD (gastroesophageal reflux disease)   . [place] measles   . Heart attack ([place])   . Hyperlipidemia   . Measles   . Mumps   . Osteopenia  11.12.10   bone density test  . Personal history of other diseases of digestive system 08.10.12   Gastric ulcer     Social History   Socioeconomic History  . Marital status: Single    Spouse name: Not on file  . Number of children: Not on file  . Years of education: Not on file  . Highest education level: Not on file  Occupational History  . Not on file  Social Needs  . Financial resource strain: Not on file  . Food insecurity:    Worry: Not on file    Inability: Not on file  . Transportation needs:    Medical: Not on file    Non-medical: Not on file  Tobacco Use  . Smoking status: Former Smoker    Packs/day: 0.25    Years: 2.00    Pack years: 0.50    Types: Cigarettes    Last attempt to quit: 06/02/1966    Years since quitting: 51.5  . Smokeless tobacco: Never Used  Substance and Sexual Activity  . Alcohol use: No    Alcohol/week: 0.0 oz  . Drug use: No  . Sexual activity: Never  Lifestyle  . Physical activity:    Days per week: Not on file    Minutes per session: Not on file  . Stress: Not on file  Relationships  . Social connections:    Talks on phone: Not on file    Gets together: Not on file    Attends religious  service: Not on file    Active member of club or organization: Not on file    Attends meetings of clubs or organizations: Not on file    Relationship status: Not on file  . Intimate partner violence:    Fear of current or ex partner: Not on file    Emotionally abused: Not on file    Physically abused: Not on file    Forced sexual activity: Not on file  Other Topics Concern  . Not on file  Social History Narrative  . Not on file    Past Surgical History:  Procedure Laterality Date  . ABDOMINAL HERNIA REPAIR  1972  . APPENDECTOMY  1973  . CATARACT EXTRACTION, BILATERAL  2005  . CHOLECYSTECTOMY  08/2016  . ESOPHAGOGASTRODUODENOSCOPY  06.2011  . EYE SURGERY  1972   Bilateral Lens Replacement  . FACIAL RECONSTRUCTION SURGERY  2008  . LEFT  HEART CATH  02.17.2014  . [place], 2008   Dislocated Jaw  . PARS PLANA VITRECTOMY W/ REPAIR OF MACULAR HOLE  2005   macular hole repair, right eye  . PARS PLANA VITRECTOMY W/ REPAIR OF MACULAR HOLE  2010   mechanical, left eye  . PROSTATE BIOPSY  [place] 2017   w/ Dr. [place] Crane  . WISDOM TOOTH EXTRACTION      Family History  Problem Relation Age of Onset  . Alzheimer's disease Mother        Deceased  . Diabetes Maternal Grandmother   . Heart attack Maternal Grandmother   . Prostate cancer Maternal Uncle   . Diabetes Maternal Uncle   . Healthy Son   . Cataracts Maternal Aunt     Allergies  Allergen Reactions  . Gabapentin Other (See Comments)    Feel "weird"; Nausea; Weakness; Syncope    Current Outpatient Medications on File Prior to Visit  Medication Sig Dispense Refill  . aspirin (ASPIR-81) 81 MG EC tablet Take 1 tablet by mouth daily.    Marland Kitchen b complex vitamins tablet Take 1 tablet by mouth daily.    . benzonatate (TESSALON) 100 MG capsule Take 1 capsule (100 mg total) by mouth 3 (three) times daily as needed. 30 capsule 0  . Calcium Carbonate-Vitamin D (CALCIUM-VITAMIN D3 PO) Take by mouth daily.    . diclofenac (VOLTAREN) 75 MG EC tablet Take 1 tablet (75 mg total) by mouth 2 (two) times daily. 20 tablet 0  . doxycycline (VIBRA-TABS) 100 MG tablet Take 1 tablet (100 mg total) by mouth 2 (two) times daily. Can give caps or generic 20 tablet 0  . hydrochlorothiazide (MICROZIDE) 12.5 MG capsule Take 1 capsule (12.5 mg total) by mouth daily. 90 capsule 3  . levocetirizine (XYZAL) 5 MG tablet Take 1 tablet (5 mg total) by mouth every evening. 30 tablet 0  . mirabegron ER (MYRBETRIQ) 50 MG TB24 tablet Take 50 mg by mouth daily.    . ondansetron (ZOFRAN ODT) 8 MG disintegrating tablet Take 1 tablet (8 mg total) by mouth every 8 (eight) hours as needed for nausea or vomiting. 20 tablet 0  . pantoprazole (PROTONIX) 40 MG tablet Take 1 tablet (40 mg total) by mouth 2 (two)  times daily. 180 tablet 3  . simvastatin (ZOCOR) 40 MG tablet Take 1 tablet (40 mg total) by mouth every evening. 90 tablet 3  . tamsulosin (FLOMAX) 0.4 MG CAPS capsule Take 2 capsules (0.8 mg total) by mouth daily. 90 capsule 1  . timolol (TIMOPTIC) 0.5 % ophthalmic solution  Place 1 drop into the left eye daily. 10 mL 12  . traMADol (ULTRAM) 50 MG tablet 1 tab po q 8 hours prn pain 15 tablet 0  . triamcinolone ointment (KENALOG) 0.5 % Apply 1 application topically 2 (two) times daily. 30 g 0   No current facility-administered medications on file prior to visit.     BP 121/77   Pulse 74   Temp 98.2 F (36.8 C) (Oral)   Resp 16   Ht 5\' 4"  (1.626 m)   Wt 180 lb 3.2 oz (81.7 kg)   SpO2 98%   BMI 30.93 kg/m       Objective:   Physical Exam   General- No acute distress. Pleasant patient. Neck- Full range of motion, no jvd Lungs- Clear, even and unlabored. Heart- regular rate and rhythm. Neurologic- CNII- XII grossly intact. Abdomen- soft, nondisteneded, bs, no rebound or guarding. Faint dirrect supres  Genital exam- right testicle is faintly tender in the epididymis region.  Mild left testicle has moderate swollen epididymis region with moderate tenderness.  Inguinal canals are clear on both sides.  No hernia felt.  Right hip- no pain on palpation or range of motion. Left hip- no pain on palpation but on rotation he does report mild hip pain.  Back-no CVA tenderness.  Rt knee- good rom. No crepitus.    Assessment & Plan:  For your recent bilateral testicle pain and suprapubic region pain, I placed order for UA, urine culture, and PSA.  On exam it appears you have probable epididymitis.  I went ahead and prescribed ciprofloxacin antibiotic.  Would recommend that you use the diclofenac for the pain.    For your left hip region pain, I did place x-ray order for your hip.  Diclofenac may help with this pain as well.  Note no hernia found on either side/inguinal canal  region.  Please go down and confirm your ultrasound appointment for 130 this afternoon.  Follow-up in 7 to 10 days or as needed.  Note prior knee xray in march showed minial ostoarthric changes. Pt reports no rash to knee. Mld itch sensation deep. Will follow see if symptoms change or worsen. If persists consider re-xray.  Mackie Pai, PA-C

## 2017-12-10 LAB — URINE CULTURE
MICRO NUMBER: 90816935
RESULT: NO GROWTH
SPECIMEN QUALITY:: ADEQUATE

## 2017-12-11 ENCOUNTER — Ambulatory Visit: Payer: MEDICARE | Admitting: Physical Therapy

## 2017-12-11 ENCOUNTER — Encounter: Payer: Self-pay | Admitting: Physical Therapy

## 2017-12-11 DIAGNOSIS — M6281 Muscle weakness (generalized): Secondary | ICD-10-CM

## 2017-12-11 DIAGNOSIS — G8929 Other chronic pain: Secondary | ICD-10-CM

## 2017-12-11 DIAGNOSIS — M545 Low back pain, unspecified: Secondary | ICD-10-CM

## 2017-12-11 DIAGNOSIS — R2689 Other abnormalities of gait and mobility: Secondary | ICD-10-CM

## 2017-12-11 NOTE — Therapy (Signed)
[facility] [address]  [place], [place], [address] Phone: [phone]   Fax:  [phone]  Physical Therapy Treatment  Patient Details  Name: Brian [name] MRN: [id number] Date of Birth: 04-Feb-1946 Referring Provider: Bonnee Quin, PA-C   Encounter Date: 12/11/2017  PT End of Session - 12/11/17 0854    Visit Number  2    Number of Visits  12    Date for PT Re-Evaluation  01/19/18    PT Start Time  0848    PT Stop Time  0930    PT Time Calculation (min)  42 min    Activity Tolerance  Patient tolerated treatment well    Behavior During Therapy  [facility] for tasks assessed/performed       Past Medical History:  Diagnosis Date  . Atypical chest pain     Negative cardiac catheterization 2014  . 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 Blind right eye   . 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 Chicken pox   . Chronic back pain   . Dislocation, jaw 1960s  . Edema    Left Leg  . Essential hypertension, benign   . GERD (gastroesophageal reflux disease)   . [place] measles   . Heart attack ([place])   . Hyperlipidemia   . Measles   . Mumps   . Osteopenia 11.12.10   bone density test  . Personal history of other diseases of digestive system 08.10.12   Gastric ulcer    Past Surgical History:  Procedure Laterality Date  . ABDOMINAL HERNIA REPAIR  1972  . APPENDECTOMY  1973  . CATARACT EXTRACTION, BILATERAL  2005  . CHOLECYSTECTOMY  08/2016  . ESOPHAGOGASTRODUODENOSCOPY  06.2011  . EYE SURGERY  1972   Bilateral Lens Replacement  . FACIAL RECONSTRUCTION SURGERY  2008  . LEFT HEART CATH  02.17.2014  . [place], 2008   Dislocated Jaw  . PARS PLANA VITRECTOMY W/ REPAIR OF MACULAR HOLE  2005   macular hole repair, right eye  . PARS PLANA VITRECTOMY W/ REPAIR OF MACULAR HOLE  2010   mechanical, left eye  . PROSTATE BIOPSY  Jul[place] 2017   w/ Dr. Nevada Crane  . WISDOM TOOTH EXTRACTION       There were no vitals filed for this visit.  Subjective Assessment - 12/11/17 0851    Subjective  Pt reports he is a little sore in the back and in the L hip today.     Pertinent History  Lumbar fusion    Limitations  Sitting;Standing;Lifting    Patient Stated Goals  to decrease pain/be comfortable    Pain Score  3     Pain Location  Back    Pain Orientation  Right;Left;Posterior    Pain Descriptors / Indicators  Sore                       [facility] Adult PT Treatment/Exercise - 12/11/17 0001      Lumbar Exercises: Aerobic   Nustep  L4 x 6 min (B UE/LE)      Lumbar Exercises: Seated   Long Arc Quad on [place]  Both;10 reps    LAQ on [place] Limitations  VCs used for abdominal contraction to prevent ball from rolling anteriorly during activity. Pt required close supervision      Lumbar Exercises: Supine   Pelvic Tilt  10 reps;Other (comment)    Pelvic Tilt Limitations  PPT with emphasis on abdominal contraction     Bridge  10 reps    Bridge Limitations  2 sets; TC required for good glute activation and full ROM    Straight Leg Raise  10 reps;Other (comment)    Straight Leg Raises Limitations  Mild quad lag noted on B LEs      Knee/Hip Exercises: Standing   Lateral Step Up  20 reps;Step Height: 6"    Forward Step Up  20 reps;Step Height: 6";Both;Hand Hold: 0    Other Standing Knee Exercises  Hip hinge to approx 50 degrees of hip flexion; Straight pole used as EC not to flex with the low back; 10 reps       Knee/Hip Exercises: Seated   Sit to Sand  10 reps;without UE support      Shoulder Exercises: ROM/Strengthening   Wall Pushups  10 reps    Other ROM/Strengthening Exercises  Wall clocks; Both; 5 x 3 reps at 1,3,5 and 11,9,7             PT Education - 12/11/17 1129    Education Details  Pt provided with additional HEP exercises    Person(s) Educated  Patient    Methods  Explanation;Demonstration    Comprehension  Verbalized understanding;Returned  demonstration       PT Short Term Goals - 12/11/17 1142      PT SHORT TERM GOAL #1   Title  Pt will be independent with initial HEP    Status  On-going        PT Long Term Goals - 12/11/17 1130      PT LONG TERM GOAL #1   Title  Pt will be independent with advanced HEP    Status  On-going      PT LONG TERM GOAL #2   Title  Pt to improve gross bilat LE strength to 4/5 to improve support to lumbar structures/musculature    Status  On-going      PT LONG TERM GOAL #3   Title  Pt will be able to perform 10 sit to stands without increased pain    Status  On-going      PT LONG TERM GOAL #4   Title  Pt will report ability to stand for greater than 30 min to improve ability to perform ADLs and care for family members    Status  On-going            Plan - 12/11/17 1137    Clinical Impression Statement  Today's treatment session focused on motor control and strengthening of abdominal musculature to provide stability and a good foundation of which to eventually transition into strengthening of paraspinal musculature of the low back. Pt tolerated activities well and was fatigued toward the end of the session. Pt will continue to benefit from physical therapy to address his strength deficits and decrease his overall pain levels.     Clinical Impairments Affecting Rehab Potential  Hearing difficulty, R eye visual deficits     PT Treatment/Interventions  ADLs/Self Care Home Management;Electrical Stimulation;Iontophoresis 4mg /ml Dexamethasone;Therapeutic activities;Moist Heat;Therapeutic exercise;Functional mobility training;Stair training;Gait training;Neuromuscular re-education;Patient/family education;Manual techniques;Dry needling;Splinting;Taping;Scar mobilization    Consulted and Agree with Plan of Care  Patient       Patient will benefit from skilled therapeutic intervention in order to improve the following deficits and impairments:  Abnormal gait, Decreased activity tolerance,  Decreased mobility, Decreased range of motion, Decreased strength, Pain, Improper body mechanics, Postural dysfunction, Impaired flexibility, Hypomobility  Visit Diagnosis: Chronic bilateral low back pain without sciatica  Muscle weakness (generalized)  Other abnormalities of gait and mobility     Problem List Patient Active Problem List   Diagnosis Date Noted  . Right shoulder pain 01/30/2017  . Left shoulder pain 05/19/2016  . Urinary hesitancy 10/18/2014  . Trapezius muscle spasm 10/18/2014  . Idiopathic neuropathy 10/18/2014  . Allergic reaction 09/28/2014  . Hyperglycemia 09/28/2014  . Acute bacterial bronchitis 08/18/2014  . Hematuria 08/18/2014  . Screening, ischemic heart disease 06/16/2014  . Lumbar radiculopathy, chronic 05/19/2014  . Midline thoracic back pain 03/24/2014  . BPPV (benign paroxysmal positional vertigo) 01/20/2014  . Right-sided low back pain with right-sided sciatica 12/21/2013  . Lightheadedness 09/22/2013  . Porokeratosis 02/25/2013  . Pain in joint, ankle and foot 02/25/2013  . Lumbosacral pain 02/15/2013  . Abdominal pain, epigastric 02/15/2013  . Acid reflux 02/15/2013  . Hearing loss 02/15/2013  . Preventive measure 02/15/2013  . Chest pain 07/19/2012  . Bradycardia, sinus 07/19/2012  . Borderline glaucoma 03/15/2012  . 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 Dislocated inferior maxilla 01/10/2011  . H/O gastric ulcer 01/10/2011  . Back pain, chronic 11/21/2010    Shirline Frees, SPT 12/11/2017, 11:43 AM  [facility] [address]  [place], [place], [address] Phone: [phone]   Fax:  [phone]  Name: COPE MARTE MRN: [id number] Date of Birth: 1946-03-13

## 2017-12-14 ENCOUNTER — Ambulatory Visit: Payer: MEDICARE | Admitting: Physical Therapy

## 2017-12-14 ENCOUNTER — Encounter: Payer: Self-pay | Admitting: Physical Therapy

## 2017-12-14 ENCOUNTER — Telehealth: Payer: Self-pay | Admitting: Medical

## 2017-12-14 DIAGNOSIS — M545 Low back pain: Principal | ICD-10-CM

## 2017-12-14 DIAGNOSIS — R2689 Other abnormalities of gait and mobility: Secondary | ICD-10-CM

## 2017-12-14 DIAGNOSIS — G8929 Other chronic pain: Secondary | ICD-10-CM

## 2017-12-14 DIAGNOSIS — M6281 Muscle weakness (generalized): Secondary | ICD-10-CM

## 2017-12-14 DIAGNOSIS — M25552 Pain in left hip: Secondary | ICD-10-CM

## 2017-12-14 NOTE — Therapy (Signed)
[facility] [address]  [place], [place], [address] Phone: [phone]   Fax:  [phone]  Physical Therapy Treatment  Patient Details  Name: Brian [name] MRN: [id number] Date of Birth: Mar 16, 1946 Referring Provider: Bonnee Quin, PA-C   Encounter Date: 12/14/2017  PT End of Session - 12/14/17 0845    Visit Number  3    Number of Visits  12    Date for PT Re-Evaluation  01/19/18    Authorization Type  Medicare [employer]    PT Start Time  0845    PT Stop Time  0927    PT Time Calculation (min)  42 min    Activity Tolerance  Patient tolerated treatment well    Behavior During Therapy  [facility] for tasks assessed/performed       Past Medical History:  Diagnosis Date  . Atypical chest pain     Negative cardiac catheterization 2014  . 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 Blind right eye   . 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 Chicken pox   . Chronic back pain   . Dislocation, jaw 1960s  . Edema    Left Leg  . Essential hypertension, benign   . GERD (gastroesophageal reflux disease)   . [place] measles   . Heart attack ([place])   . Hyperlipidemia   . Measles   . Mumps   . Osteopenia 11.12.10   bone density test  . Personal history of other diseases of digestive system 08.10.12   Gastric ulcer    Past Surgical History:  Procedure Laterality Date  . ABDOMINAL HERNIA REPAIR  1972  . APPENDECTOMY  1973  . CATARACT EXTRACTION, BILATERAL  2005  . CHOLECYSTECTOMY  08/2016  . ESOPHAGOGASTRODUODENOSCOPY  06.2011  . EYE SURGERY  1972   Bilateral Lens Replacement  . FACIAL RECONSTRUCTION SURGERY  2008  . LEFT HEART CATH  02.17.2014  . [place], 2008   Dislocated Jaw  . PARS PLANA VITRECTOMY W/ REPAIR OF MACULAR HOLE  2005   macular hole repair, right eye  . PARS PLANA VITRECTOMY W/ REPAIR OF MACULAR HOLE  2010   mechanical, left eye  . PROSTATE BIOPSY  [place] 2017    w/ Dr. [place] Crane  . WISDOM TOOTH EXTRACTION      There were no vitals filed for this visit.  Subjective Assessment - 12/14/17 0847    Subjective  Pt reporting arthritic pain in knees & hips while walking down steps, but otherwise denis pain today.    Pertinent History  Lumbar fusion    Patient Stated Goals  to decrease pain/be comfortable    Currently in Pain?  No/denies                       [facility] Adult PT Treatment/Exercise - 12/14/17 0845      Exercises   Exercises  Lumbar      Lumbar Exercises: Aerobic   Nustep  L5 x 6 min (B UE/LE)      Lumbar Exercises: Machines for Strengthening   Cybex Knee Extension  B LE 15# x 15    Cybex Knee Flexion  B LE 15# x 15      Lumbar Exercises: Standing   Heel Raises  20 reps;3 seconds    Heel Raises Limitations  UE support on counter    Functional Squats  15 reps;3 seconds  Functional Squats Limitations  counter squat - cues for neutral spine    Row  Both;15 reps;Theraband;Strengthening    Theraband Level (Row)  Level 2 (Red)    Shoulder Extension  Both;15 reps;Theraband;Strengthening    Theraband Level (Shoulder Extension)  Level 2 (Red)    Shoulder Extension Limitations  + scap retraction    Other Standing Lumbar Exercises  B pallof press with red TB x15      Lumbar Exercises: Supine   Clam  10 reps;3 seconds    Clam Limitations  abd bracing + alt hip ABD/ER with red TB    Bent Knee Raise  10 reps;3 seconds    Bent Knee Raise Limitations  brace marching with red TB    Bridge with clamshell  10 reps;3 seconds    Bridge with [employer] Limitations  + hip abd isometric with red TB      Lumbar Exercises: Sidelying   Clam  Both;10 reps;3 seconds    Clam Limitations  red TB               PT Short Term Goals - 12/14/17 0926      PT SHORT TERM GOAL #1   Title  Pt will be independent with initial HEP    Status  Achieved        PT Long Term Goals - 12/11/17 1130      PT LONG TERM GOAL #1   Title   Pt will be independent with advanced HEP    Status  On-going      PT LONG TERM GOAL #2   Title  Pt to improve gross bilat LE strength to 4/5 to improve support to lumbar structures/musculature    Status  On-going      PT LONG TERM GOAL #3   Title  Pt will be able to perform 10 sit to stands without increased pain    Status  On-going      PT LONG TERM GOAL #4   Title  Pt will report ability to stand for greater than 30 min to improve ability to perform ADLs and care for family members    Status  On-going            Plan - 12/14/17 0849    Clinical Impression Statement  Asad w/o c/o back pain today, noting only arthritic pain in hips and knees with stair negotiation. He denies any concerns with initial HEP. Good tolerance for progression of abdominal and proximal LE strengthening targeting increasing musculature support for low back.    Rehab Potential  Good    Clinical Impairments Affecting Rehab Potential  Hearing difficulty, R eye visual deficits     PT Treatment/Interventions  ADLs/Self Care Home Management;Electrical Stimulation;Iontophoresis 4mg /ml Dexamethasone;Therapeutic activities;Moist Heat;Therapeutic exercise;Functional mobility training;Stair training;Gait training;Neuromuscular re-education;Patient/family education;Manual techniques;Dry needling;Splinting;Taping;Scar mobilization    Consulted and Agree with Plan of Care  Patient       Patient will benefit from skilled therapeutic intervention in order to improve the following deficits and impairments:  Abnormal gait, Decreased activity tolerance, Decreased mobility, Decreased range of motion, Decreased strength, Pain, Improper body mechanics, Postural dysfunction, Impaired flexibility, Hypomobility  Visit Diagnosis: Chronic bilateral low back pain without sciatica  Muscle weakness (generalized)  Other abnormalities of gait and mobility     Problem List Patient Active Problem List   Diagnosis Date Noted  .  Right shoulder pain 01/30/2017  . Left shoulder pain 05/19/2016  . Urinary hesitancy 10/18/2014  . Trapezius muscle  spasm 10/18/2014  . Idiopathic neuropathy 10/18/2014  . Allergic reaction 09/28/2014  . Hyperglycemia 09/28/2014  . Acute bacterial bronchitis 08/18/2014  . Hematuria 08/18/2014  . Screening, ischemic heart disease 06/16/2014  . Lumbar radiculopathy, chronic 05/19/2014  . Midline thoracic back pain 03/24/2014  . BPPV (benign paroxysmal positional vertigo) 01/20/2014  . Right-sided low back pain with right-sided sciatica 12/21/2013  . Lightheadedness 09/22/2013  . Porokeratosis 02/25/2013  . Pain in joint, ankle and foot 02/25/2013  . Lumbosacral pain 02/15/2013  . Abdominal pain, epigastric 02/15/2013  . Acid reflux 02/15/2013  . Hearing loss 02/15/2013  . Preventive measure 02/15/2013  . Chest pain 07/19/2012  . Bradycardia, sinus 07/19/2012  . Borderline glaucoma 03/15/2012  . 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 Dislocated inferior maxilla 01/10/2011  . H/O gastric ulcer 01/10/2011  . Back pain, chronic 11/21/2010    Percival Spanish, PT, MPT 12/14/2017, 10:15 AM  [facility] [address]  [place], [place], [address] Phone: [phone]   Fax:  [phone]  Name: Brian [name] MRN: [id number] Date of Birth: 11-12-1945

## 2017-12-14 NOTE — Telephone Encounter (Signed)
Referral to ortho placed.

## 2017-12-17 ENCOUNTER — Encounter: Payer: MEDICARE | Admitting: Physical Therapy

## 2017-12-18 ENCOUNTER — Ambulatory Visit: Payer: MEDICARE | Admitting: Physical Therapy

## 2017-12-18 ENCOUNTER — Encounter: Payer: Self-pay | Admitting: Physical Therapy

## 2017-12-18 DIAGNOSIS — G8929 Other chronic pain: Secondary | ICD-10-CM

## 2017-12-18 DIAGNOSIS — M545 Low back pain: Secondary | ICD-10-CM | POA: Diagnosis not present

## 2017-12-18 DIAGNOSIS — M6281 Muscle weakness (generalized): Secondary | ICD-10-CM

## 2017-12-18 DIAGNOSIS — R2689 Other abnormalities of gait and mobility: Secondary | ICD-10-CM

## 2017-12-18 NOTE — Therapy (Addendum)
[facility] [address]  [place], [place], [address] Phone: [phone]   Fax:  [phone]  Physical Therapy Treatment  Patient Details  Name: Brian [name] MRN: [id number] Date of Birth: 1945-09-03 Referring Provider: Bonnee Quin, PA-C   Encounter Date: 12/18/2017  PT End of Session - 12/18/17 0837    Visit Number  4    Number of Visits  12    Date for PT Re-Evaluation  01/19/18    Authorization Type  Medicare [employer]    PT Start Time  [phone]    PT Stop Time  0920    PT Time Calculation (min)  46 min    Equipment Utilized During Treatment  Other (comment)    Activity Tolerance  Patient tolerated treatment well    Behavior During Therapy  [facility] for tasks assessed/performed       Past Medical History:  Diagnosis Date  . Atypical chest pain     Negative cardiac catheterization 2014  . 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 Blind right eye   . 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 Chicken pox   . Chronic back pain   . Dislocation, jaw 1960s  . Edema    Left Leg  . Essential hypertension, benign   . GERD (gastroesophageal reflux disease)   . [place] measles   . Heart attack ([place])   . Hyperlipidemia   . Measles   . Mumps   . Osteopenia 11.12.10   bone density test  . Personal history of other diseases of digestive system 08.10.12   Gastric ulcer    Past Surgical History:  Procedure Laterality Date  . ABDOMINAL HERNIA REPAIR  1972  . APPENDECTOMY  1973  . CATARACT EXTRACTION, BILATERAL  2005  . CHOLECYSTECTOMY  08/2016  . ESOPHAGOGASTRODUODENOSCOPY  06.2011  . EYE SURGERY  1972   Bilateral Lens Replacement  . FACIAL RECONSTRUCTION SURGERY  2008  . LEFT HEART CATH  02.17.2014  . [place], 2008   Dislocated Jaw  . PARS PLANA VITRECTOMY W/ REPAIR OF MACULAR HOLE  2005   macular hole repair, right eye  . PARS PLANA VITRECTOMY W/ REPAIR OF MACULAR HOLE  2010    mechanical, left eye  . PROSTATE BIOPSY  [place] 2017   w/ Dr. [place] Crane  . WISDOM TOOTH EXTRACTION      There were no vitals filed for this visit.  Subjective Assessment - 12/18/17 0838    Subjective  Pt reporting pain in the L hip and R knee today. No pain in the back today.     Pertinent History  Lumbar fusion    Limitations  Sitting;Standing;Lifting    Currently in Pain?  Yes    Pain Score  5     Pain Location  Hip    Pain Orientation  Left    Pain Descriptors / Indicators  Aching    Pain Type  Chronic pain    Multiple Pain Sites  Yes    Pain Score  3    Pain Location  Knee    Pain Orientation  Right    Pain Descriptors / Indicators  Aching    Pain Type  Chronic pain                       [facility] Adult PT Treatment/Exercise - 12/18/17 0001      Lumbar Exercises: Aerobic  Nustep  L6 x 6 min (B UE/LE)      Lumbar Exercises: Seated   Long Arc Quad on [employer] on [place] Limitations  Green PB; Pt not able to achieve full knee extension secondary to hamstring tightness. VCs used to maintain good abdominal contraction throughout exercise.     Hip Flexion on Ball  10 reps;Both;Other (comment)    Hip Flexion on Ball Limitations  2 sets, 10 reps. 1st set with arms at sides, 2nd set with arms overhead    Other Seated Lumbar Exercises  --      Lumbar Exercises: Supine   Bridge  10 reps    Bridge Limitations  with feet on AirEx pad    Bridge with March  10 reps;Other (comment) 10 reps of 2 marches on each LE while maintaining bridge      Lumbar Exercises: Quadruped   Straight Leg Raise  10 reps;2 seconds    Straight Leg Raises Limitations  TC from PT to prevent torso rotation       Knee/Hip Exercises: Standing   Rocker Board  1 minute    Rocker Board Limitations  Pt required ~1 min to stabalize on board with 2 UE assist; then was able to maintain balance with VCs from PT for hip/ankle strategy to maintain level board      Shoulder Exercises:  Standing   Extension  15 reps;Theraband;Both    Theraband Level (Shoulder Extension)  Level 2 (Red)    Row  15 reps;Theraband;Both    Theraband Level (Shoulder Row)  Level 2 (Red)    Other Standing Exercises  Scaption with thumb up grip; 10 reps. PT provided VC to maintain elbow extension; Yellow TB       Shoulder Exercises: Therapy Ball   Other Therapy Ball Exercises  W's prone over [place]. 3 sets of 10 reps.                PT Short Term Goals - 12/14/17 0926      PT SHORT TERM GOAL #1   Title  Pt will be independent with initial HEP    Status  Achieved        PT Long Term Goals - 12/18/17 0937      PT LONG TERM GOAL #1   Title  Pt will be independent with advanced HEP    Status  On-going    Target Date  01/19/18      PT LONG TERM GOAL #2   Title  Pt to improve gross bilat LE strength to 4/5 to improve support to lumbar structures/musculature    Status  On-going    Target Date  01/19/18      PT LONG TERM GOAL #3   Title  Pt will be able to perform 10 sit to stands without increased pain    Status  On-going    Target Date  01/19/18      PT LONG TERM GOAL #4   Title  Pt will report ability to stand for greater than 30 min to improve ability to perform ADLs and care for family members    Status  On-going    Target Date  01/19/18            Plan - 12/18/17 0932    Clinical Impression Statement  Pt not noting any back pain today but more pain on the L hip and R knee secondary to arthritis. Pt tolerated treatment session  well today with focus on abdominal stability during movement. Pt requires VC to maintain abdominal contraction. Pt continues to demonstrate abnormalities in movement patterns including maintain PPT and hip extension during sit to stand, and decreased LE strength. Pt will continue to benefit from physical therapy to address these deficits and progress toward functional goals.     Rehab Potential  Good    Clinical Impairments Affecting Rehab  Potential  Hearing difficulty, R eye visual deficits     PT Treatment/Interventions  ADLs/Self Care Home Management;Electrical Stimulation;Iontophoresis 4mg /ml Dexamethasone;Therapeutic activities;Moist Heat;Therapeutic exercise;Functional mobility training;Stair training;Gait training;Neuromuscular re-education;Patient/family education;Manual techniques;Dry needling;Splinting;Taping;Scar mobilization    Consulted and Agree with Plan of Care  Patient       Patient will benefit from skilled therapeutic intervention in order to improve the following deficits and impairments:  Abnormal gait, Decreased activity tolerance, Decreased mobility, Decreased range of motion, Decreased strength, Pain, Improper body mechanics, Postural dysfunction, Impaired flexibility, Hypomobility  Visit Diagnosis: Chronic bilateral low back pain without sciatica  Muscle weakness (generalized)  Other abnormalities of gait and mobility     Problem List Patient Active Problem List   Diagnosis Date Noted  . Right shoulder pain 01/30/2017  . Left shoulder pain 05/19/2016  . Urinary hesitancy 10/18/2014  . Trapezius muscle spasm 10/18/2014  . Idiopathic neuropathy 10/18/2014  . Allergic reaction 09/28/2014  . Hyperglycemia 09/28/2014  . Acute bacterial bronchitis 08/18/2014  . Hematuria 08/18/2014  . Screening, ischemic heart disease 06/16/2014  . Lumbar radiculopathy, chronic 05/19/2014  . Midline thoracic back pain 03/24/2014  . BPPV (benign paroxysmal positional vertigo) 01/20/2014  . Right-sided low back pain with right-sided sciatica 12/21/2013  . Lightheadedness 09/22/2013  . Porokeratosis 02/25/2013  . Pain in joint, ankle and foot 02/25/2013  . Lumbosacral pain 02/15/2013  . Abdominal pain, epigastric 02/15/2013  . Acid reflux 02/15/2013  . Hearing loss 02/15/2013  . Preventive measure 02/15/2013  . Chest pain 07/19/2012  . Bradycardia, sinus 07/19/2012  . Borderline glaucoma 03/15/2012  .  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 Dislocated inferior maxilla 01/10/2011  . H/O gastric ulcer 01/10/2011  . Back pain, chronic 11/21/2010    Shirline Frees, SPT 12/18/2017, 9:59 AM  [facility] [address]  [place], [place], [address] Phone: [phone]   Fax:  [phone]  Name: Brian [name] MRN: [id number] Date of Birth: Mar 08, 1946

## 2017-12-22 MED FILL — METHYLPREDNISOLONE 4 MG TAB: 4 | 6 days supply | Qty: 21 | Fill #0

## 2017-12-24 ENCOUNTER — Encounter: Payer: Self-pay | Admitting: Medical

## 2017-12-24 ENCOUNTER — Ambulatory Visit (INDEPENDENT_AMBULATORY_CARE_PROVIDER_SITE_OTHER): Payer: MEDICARE | Admitting: Medical

## 2017-12-24 ENCOUNTER — Ambulatory Visit: Payer: MEDICARE

## 2017-12-24 VITALS — BP 125/70 | HR 62 | Temp 98.0°F | Resp 16 | Ht 64.0 in | Wt 186.2 lb

## 2017-12-24 DIAGNOSIS — M6281 Muscle weakness (generalized): Secondary | ICD-10-CM

## 2017-12-24 DIAGNOSIS — M545 Low back pain: Secondary | ICD-10-CM | POA: Diagnosis not present

## 2017-12-24 DIAGNOSIS — R2689 Other abnormalities of gait and mobility: Secondary | ICD-10-CM

## 2017-12-24 DIAGNOSIS — G8929 Other chronic pain: Secondary | ICD-10-CM

## 2017-12-24 MED ORDER — CYCLOBENZAPRINE HCL 5 MG PO TABS: 5.0000 mg | ORAL_TABLET | Freq: Every day | ORAL | 0 refills | Status: DC

## 2017-12-24 MED FILL — CYCLOBENZAPRINE 5 MG TABLET: 5 | 7 days supply | Qty: 7 | Fill #0

## 2017-12-24 NOTE — Therapy (Signed)
[facility] [address]  [place], [place], [address] Phone: [phone]   Fax:  [phone]  Physical Therapy Treatment  Patient Details  Name: Brian [name] MRN: [id number] Date of Birth: Apr 02, 1946 Referring Provider: Bonnee Quin, PA-C   Encounter Date: 12/24/2017  PT End of Session - 12/24/17 1317    Visit Number  5    Number of Visits  12    Date for PT Re-Evaluation  01/19/18    Authorization Type  Medicare [employer] & [employer]    PT Start Time  1310    PT Stop Time  1357    PT Time Calculation (min)  47 min    Equipment Utilized During Treatment  --    Activity Tolerance  Patient tolerated treatment well    Behavior During Therapy  [facility] for tasks assessed/performed       Past Medical History:  Diagnosis Date  . Atypical chest pain     Negative cardiac catheterization 2014  . 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 Blind right eye   . 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 Chicken pox   . Chronic back pain   . Dislocation, jaw 1960s  . Edema    Left Leg  . Essential hypertension, benign   . GERD (gastroesophageal reflux disease)   . [place] measles   . Heart attack ([place])   . Hyperlipidemia   . Measles   . Mumps   . Osteopenia 11.12.10   bone density test  . Personal history of other diseases of digestive system 08.10.12   Gastric ulcer    Past Surgical History:  Procedure Laterality Date  . ABDOMINAL HERNIA REPAIR  1972  . APPENDECTOMY  1973  . CATARACT EXTRACTION, BILATERAL  2005  . CHOLECYSTECTOMY  08/2016  . ESOPHAGOGASTRODUODENOSCOPY  06.2011  . EYE SURGERY  1972   Bilateral Lens Replacement  . FACIAL RECONSTRUCTION SURGERY  2008  . LEFT HEART CATH  02.17.2014  . [place], 2008   Dislocated Jaw  . PARS PLANA VITRECTOMY W/ REPAIR OF MACULAR HOLE  2005   macular hole repair, right eye  . PARS PLANA VITRECTOMY W/ REPAIR OF MACULAR HOLE  2010   mechanical, left eye  . PROSTATE BIOPSY  Jul[place] 2017   w/ Dr. Nevada Crane  . WISDOM TOOTH EXTRACTION      There were no vitals filed for this visit.  Subjective Assessment - 12/24/17 1314    Subjective  Pt. noing he fell backwards into a chair and, "jarred his back" and has had increased pain today.      Pertinent History  Lumbar fusion    Patient Stated Goals  to decrease pain/be comfortable    Currently in Pain?  Yes    Pain Score  6     Pain Location  Hip    Pain Orientation  Left    Pain Descriptors / Indicators  Aching    Pain Type  Chronic pain    Pain Radiating Towards  radiates into L groin     Pain Onset  More than a month ago    Multiple Pain Sites  Yes    Pain Score  3    Pain Location  Knee    Pain Orientation  Right    Pain Descriptors / Indicators  Aching    Pain Type  Chronic pain    Pain Onset  More than a month ago  Aggravating Factors   navigating stairs                        [facility] Adult PT Treatment/Exercise - 12/24/17 1320      Lumbar Exercises: Aerobic   Nustep  L6 x 7 min (B UE/LE)      Lumbar Exercises: Machines for Strengthening   Cybex Knee Extension  B LE's: 15# x 20 reps     Cybex Knee Flexion  B LE's: 20# x 15 reps       Lumbar Exercises: Standing   Other Standing Lumbar Exercises  B pallof press with red TB x15      Lumbar Exercises: Seated   Other Seated Lumbar Exercises  Seated B fitter leg press (1 black band, 1 blue) x 15 reps       Lumbar Exercises: Supine   Clam  3 seconds x 12 reps     Clam Limitations  abd bracing + alt hip ABD/ER with red TB    Bent Knee Raise  3 seconds x 12 reps     Bent Knee Raise Limitations  brace marching with red TB    Bridge with clamshell  3 seconds x 12 reps     Bridge with [employer] Limitations  + hip abd isometric with red TB    Other Supine Lumbar Exercises  Hooklying adduction ball squeeze 5" x 15 reps       Lumbar Exercises: Sidelying   Clam  Both x 12 reps     Clam Limitations   red TB             PT Education - 12/24/17 1404    Education Details  HEP update     Person(s) Educated  Patient    Methods  Explanation;Demonstration;Verbal cues;Handout    Comprehension  Verbalized understanding;Returned demonstration;Verbal cues required;Need further instruction       PT Short Term Goals - 12/14/17 0926      PT SHORT TERM GOAL #1   Title  Pt will be independent with initial HEP    Status  Achieved        PT Long Term Goals - 12/18/17 0937      PT LONG TERM GOAL #1   Title  Pt will be independent with advanced HEP    Status  On-going    Target Date  01/19/18      PT LONG TERM GOAL #2   Title  Pt to improve gross bilat LE strength to 4/5 to improve support to lumbar structures/musculature    Status  On-going    Target Date  01/19/18      PT LONG TERM GOAL #3   Title  Pt will be able to perform 10 sit to stands without increased pain    Status  On-going    Target Date  01/19/18      PT LONG TERM GOAL #4   Title  Pt will report ability to stand for greater than 30 min to improve ability to perform ADLs and care for family members    Status  On-going    Target Date  01/19/18            Plan - 12/24/17 1318    Clinical Impression Statement  Albertus reporting he fell back into chair at home however hard landing in chair, "Jarred my back", and notes some increased pain today.  Tolerated targeted strengthening on hip adductors, extensors, and knee flexors  today well.  HEP updated and pt. leaving session with decreased pain from initial subjective report.  Modalities deferred as pt. needing to make 2pm MD appointment.  Will continue to progress toward goals.      Clinical Impairments Affecting Rehab Potential  Hearing difficulty, R eye visual deficits     PT Treatment/Interventions  ADLs/Self Care Home Management;Electrical Stimulation;Iontophoresis 4mg /ml Dexamethasone;Therapeutic activities;Moist Heat;Therapeutic exercise;Functional mobility  training;Stair training;Gait training;Neuromuscular re-education;Patient/family education;Manual techniques;Dry needling;Splinting;Taping;Scar mobilization    Consulted and Agree with Plan of Care  Patient       Patient will benefit from skilled therapeutic intervention in order to improve the following deficits and impairments:  Abnormal gait, Decreased activity tolerance, Decreased mobility, Decreased range of motion, Decreased strength, Pain, Improper body mechanics, Postural dysfunction, Impaired flexibility, Hypomobility  Visit Diagnosis: Chronic bilateral low back pain without sciatica  Muscle weakness (generalized)  Other abnormalities of gait and mobility     Problem List Patient Active Problem List   Diagnosis Date Noted  . Right shoulder pain 01/30/2017  . Left shoulder pain 05/19/2016  . Urinary hesitancy 10/18/2014  . Trapezius muscle spasm 10/18/2014  . Idiopathic neuropathy 10/18/2014  . Allergic reaction 09/28/2014  . Hyperglycemia 09/28/2014  . Acute bacterial bronchitis 08/18/2014  . Hematuria 08/18/2014  . Screening, ischemic heart disease 06/16/2014  . Lumbar radiculopathy, chronic 05/19/2014  . Midline thoracic back pain 03/24/2014  . BPPV (benign paroxysmal positional vertigo) 01/20/2014  . Right-sided low back pain with right-sided sciatica 12/21/2013  . Lightheadedness 09/22/2013  . Porokeratosis 02/25/2013  . Pain in joint, ankle and foot 02/25/2013  . Lumbosacral pain 02/15/2013  . Abdominal pain, epigastric 02/15/2013  . Acid reflux 02/15/2013  . Hearing loss 02/15/2013  . Preventive measure 02/15/2013  . Chest pain 07/19/2012  . Bradycardia, sinus 07/19/2012  . Borderline glaucoma 03/15/2012  . 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 Dislocated inferior maxilla 01/10/2011  . H/O gastric ulcer 01/10/2011  . Back pain, chronic 11/21/2010    Bess Harvest, PTA 12/24/17 2:14 PM   [facility] [address]  [place], [place], [address] Phone: [phone]   Fax:  [phone]  Name: ROMEL DUMOND MRN: [id number] Date of Birth: 1945-10-17

## 2017-12-24 NOTE — Patient Instructions (Signed)
For your recent right-sided back pain after fall, I do want you to go ahead and start your Medrol dose pack prescribed by your orthopedic surgeon.  Also adding Flexeril muscle relaxant to use at night.  With the nature of the fall and landing in soft chair I do not think x-ray is indicated.  I think you just pulled a muscle that is adjacent to the spine.  We will see if you recover quickly.  If any new or worsening symptoms occur let us know.  Follow-up in 7 to 10 days or as needed.

## 2017-12-24 NOTE — Progress Notes (Signed)
Subjective:    Patient ID: Brian [name], male    DOB: 1946-01-19, 72 y.o.   MRN: [id number]  HPI  Pt states this morning clearing things off table. He stepped back on rocking chair. He grabbed a chair to stablize self. He landed cleanly in chair. He states he twised he back quickly and since this he has some back pain.  Since then when he moves and twist he has some para lumbar/thoracic area pain.  Pt saw his back surgeon 2 days ago. His back surgeon,  No severe/radicular type pain running to his legs.  Pt currently not on muscle relaxant. He will start medrol dose pack prescribed by his ortho for some recent groin pain that pt describes as maybe related to his back surgery..   Review of Systems  Constitutional: Negative for chills, fatigue and fever.  Respiratory: Negative for choking, chest tightness, shortness of breath and wheezing.        Occasional mild intermittnet faint sob. Not constant. Not present today. No  Popliteal. No wheezing. No associated chest pain.  cxr in past normal. No cough or fever. While on medrol for muscle pain note if this helps stop or decrease occasional sob. Update me early next week. If any chest pain then advised ED eval. (this was second complaint he mentioned after discussion of chief complaint)  Cardiovascular: Negative for chest pain and palpitations.  Gastrointestinal: Negative for abdominal pain.  Musculoskeletal: Positive for back pain.  Skin: Negative for rash.  Neurological: Negative for dizziness, speech difficulty, weakness and headaches.  Psychiatric/Behavioral: Negative for behavioral problems and confusion.    Past Medical History:  Diagnosis Date  . Atypical chest pain     Negative cardiac catheterization 2014  . 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 Blind right eye   . 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 Chicken pox   . Chronic back pain   . Dislocation, jaw 1960s  . Edema    Left Leg  . Essential  hypertension, benign   . GERD (gastroesophageal reflux disease)   . [place] measles   . Heart attack ([place])   . Hyperlipidemia   . Measles   . Mumps   . Osteopenia 11.12.10   bone density test  . Personal history of other diseases of digestive system 08.10.12   Gastric ulcer     Social History   Socioeconomic History  . Marital status: Single    Spouse name: Not on file  . Number of children: Not on file  . Years of education: Not on file  . Highest education level: Not on file  Occupational History  . Not on file  Social Needs  . Financial resource strain: Not on file  . Food insecurity:    Worry: Not on file    Inability: Not on file  . Transportation needs:    Medical: Not on file    Non-medical: Not on file  Tobacco Use  . Smoking status: Former Smoker    Packs/day: 0.25    Years: 2.00    Pack years: 0.50    Types: Cigarettes    Last attempt to quit: 06/02/1966    Years since quitting: 51.5  . Smokeless tobacco: Never Used  Substance and Sexual Activity  . Alcohol use: No    Alcohol/week: 0.0 oz  . Drug use: No  . Sexual activity: Never  Lifestyle  . Physical activity:    Days per week: Not on file  Minutes per session: Not on file  . Stress: Not on file  Relationships  . Social connections:    Talks on phone: Not on file    Gets together: Not on file    Attends religious service: Not on file    Active member of club or organization: Not on file    Attends meetings of clubs or organizations: Not on file    Relationship status: Not on file  . Intimate partner violence:    Fear of current or ex partner: Not on file    Emotionally abused: Not on file    Physically abused: Not on file    Forced sexual activity: Not on file  Other Topics Concern  . Not on file  Social History Narrative  . Not on file    Past Surgical History:  Procedure Laterality Date  . ABDOMINAL HERNIA REPAIR  1972  . APPENDECTOMY  1973  . CATARACT EXTRACTION, BILATERAL  2005    . CHOLECYSTECTOMY  08/2016  . ESOPHAGOGASTRODUODENOSCOPY  06.2011  . EYE SURGERY  1972   Bilateral Lens Replacement  . FACIAL RECONSTRUCTION SURGERY  2008  . LEFT HEART CATH  02.17.2014  . [place], 2008   Dislocated Jaw  . PARS PLANA VITRECTOMY W/ REPAIR OF MACULAR HOLE  2005   macular hole repair, right eye  . PARS PLANA VITRECTOMY W/ REPAIR OF MACULAR HOLE  2010   mechanical, left eye  . PROSTATE BIOPSY  Jul[place] 2017   w/ Dr. [place] Crane  . WISDOM TOOTH EXTRACTION      Family History  Problem Relation Age of Onset  . Alzheimer's disease Mother        Deceased  . Diabetes Maternal Grandmother   . Heart attack Maternal Grandmother   . Prostate cancer Maternal Uncle   . Diabetes Maternal Uncle   . Healthy Son   . Cataracts Maternal Aunt     Allergies  Allergen Reactions  . Gabapentin Other (See Comments)    Feel "weird"; Nausea; Weakness; Syncope    Current Outpatient Medications on File Prior to Visit  Medication Sig Dispense Refill  . aspirin (ASPIR-81) 81 MG EC tablet Take 1 tablet by mouth daily.    Marland Kitchen b complex vitamins tablet Take 1 tablet by mouth daily.    . benzonatate (TESSALON) 100 MG capsule Take 1 capsule (100 mg total) by mouth 3 (three) times daily as needed. 30 capsule 0  . Calcium Carbonate-Vitamin D (CALCIUM-VITAMIN D3 PO) Take by mouth daily.    . ciprofloxacin (CIPRO) 500 MG tablet Take 1 tablet (500 mg total) by mouth 2 (two) times daily. 20 tablet 0  . diclofenac (VOLTAREN) 75 MG EC tablet Take 1 tablet (75 mg total) by mouth 2 (two) times daily. 20 tablet 0  . doxycycline (VIBRA-TABS) 100 MG tablet Take 1 tablet (100 mg total) by mouth 2 (two) times daily. Can give caps or generic 20 tablet 0  . hydrochlorothiazide (MICROZIDE) 12.5 MG capsule Take 1 capsule (12.5 mg total) by mouth daily. 90 capsule 3  . levocetirizine (XYZAL) 5 MG tablet Take 1 tablet (5 mg total) by mouth every evening. 30 tablet 0  . methylPREDNISolone (MEDROL DOSEPAK) 4  MG TBPK tablet follow package directions    . mirabegron ER (MYRBETRIQ) 50 MG TB24 tablet Take 50 mg by mouth daily.    . ondansetron (ZOFRAN ODT) 8 MG disintegrating tablet Take 1 tablet (8 mg total) by mouth every 8 (eight) hours as needed  for nausea or vomiting. 20 tablet 0  . pantoprazole (PROTONIX) 40 MG tablet Take 1 tablet (40 mg total) by mouth 2 (two) times daily. 180 tablet 3  . simvastatin (ZOCOR) 40 MG tablet Take 1 tablet (40 mg total) by mouth every evening. 90 tablet 3  . tamsulosin (FLOMAX) 0.4 MG CAPS capsule Take 2 capsules (0.8 mg total) by mouth daily. 90 capsule 1  . timolol (TIMOPTIC) 0.5 % ophthalmic solution Place 1 drop into the left eye daily. 10 mL 12  . traMADol (ULTRAM) 50 MG tablet 1 tab po q 8 hours prn pain 15 tablet 0  . triamcinolone ointment (KENALOG) 0.5 % Apply 1 application topically 2 (two) times daily. 30 g 0   No current facility-administered medications on file prior to visit.     BP 125/70   Pulse 62   Temp 98 F (36.7 C) (Oral)   Resp 16   Ht 5\' 4"  (1.626 m)   Wt 186 lb 3.2 oz (84.5 kg)   SpO2 98%   BMI 31.96 kg/m       Objective:   Physical Exam  General- No acute distress. Pleasant patient. Neck- Full range of motion, no jvd Lungs- Clear, even and unlabored. Heart- regular rate and rhythm. Neurologic- CNII- XII grossly intact Back- no mid lumbar pain on palpation.  Right side mild paralumbar/thoracic tenderness to palpation.  Tenderness is more at the junction of lumbar and thoracic spine.  Lower ext-l5-s1 sensation intact. No lower ext weakness.         Assessment & Plan:  For your recent right-sided back pain after fall, I do want you to go ahead and start your Medrol dose pack prescribed by your orthopedic surgeon.  Also adding Flexeril muscle relaxant to use at night.  With the nature of the fall and landing in soft chair I do not think x-ray is indicated.  I think you just pulled a muscle that is adjacent to the spine.  We  will see if you recover quickly.  If any new or worsening symptoms occur let us know.  Follow-up in 7 to 10 days or as needed.  Mackie Pai, PA-C

## 2017-12-25 ENCOUNTER — Ambulatory Visit: Payer: MEDICARE | Admitting: Physical Therapy

## 2017-12-25 ENCOUNTER — Encounter: Payer: Self-pay | Admitting: Physical Therapy

## 2017-12-25 DIAGNOSIS — G8929 Other chronic pain: Secondary | ICD-10-CM

## 2017-12-25 DIAGNOSIS — M545 Low back pain: Principal | ICD-10-CM

## 2017-12-25 DIAGNOSIS — R2689 Other abnormalities of gait and mobility: Secondary | ICD-10-CM

## 2017-12-25 DIAGNOSIS — M6281 Muscle weakness (generalized): Secondary | ICD-10-CM

## 2017-12-25 NOTE — Therapy (Addendum)
[facility] [address]  [place], [place], [address] Phone: [phone]   Fax:  [phone]  Physical Therapy Treatment  Patient Details  Name: Brian [name] MRN: [id number] Date of Birth: May 26, 1946 Referring Provider: Bonnee Quin, PA-C   Encounter Date: 12/25/2017  PT End of Session - 12/25/17 0924    Visit Number  6    Number of Visits  12    Date for PT Re-Evaluation  01/19/18    Authorization Type  Medicare [employer] & [employer]    PT Start Time  [phone]    PT Stop Time  0923    PT Time Calculation (min)  44 min    Activity Tolerance  Patient tolerated treatment well    Behavior During Therapy  [facility] for tasks assessed/performed       Past Medical History:  Diagnosis Date  . Atypical chest pain     Negative cardiac catheterization 2014  . 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 Blind right eye   . 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 Chicken pox   . Chronic back pain   . Dislocation, jaw 1960s  . Edema    Left Leg  . Essential hypertension, benign   . GERD (gastroesophageal reflux disease)   . [place] measles   . Heart attack ([place])   . Hyperlipidemia   . Measles   . Mumps   . Osteopenia 11.12.10   bone density test  . Personal history of other diseases of digestive system 08.10.12   Gastric ulcer    Past Surgical History:  Procedure Laterality Date  . ABDOMINAL HERNIA REPAIR  1972  . APPENDECTOMY  1973  . CATARACT EXTRACTION, BILATERAL  2005  . CHOLECYSTECTOMY  08/2016  . ESOPHAGOGASTRODUODENOSCOPY  06.2011  . EYE SURGERY  1972   Bilateral Lens Replacement  . FACIAL RECONSTRUCTION SURGERY  2008  . LEFT HEART CATH  02.17.2014  . [place], 2008   Dislocated Jaw  . PARS PLANA VITRECTOMY W/ REPAIR OF MACULAR HOLE  2005   macular hole repair, right eye  . PARS PLANA VITRECTOMY W/ REPAIR OF MACULAR HOLE  2010   mechanical, left eye  . PROSTATE BIOPSY  [place] 2017    w/ Dr. Nevada Crane  . WISDOM TOOTH EXTRACTION      There were no vitals filed for this visit.  Subjective Assessment - 12/25/17 0847    Subjective  Pt still complains of some residual soreness after recent LOB with fall 2 days ago. Pt was cleaning items off of a table and when stepping backward misstepped over a toy on the ground. Pt states that he panicked and twisted quickly to find something to grab onto, grabbed a chair but then the chair fell and he was able to prevent a fall by sitting quickly in a nearby office chair. Pain levels since the fall have slowly been coming down.     Pertinent History  Lumbar fusion    Limitations  Sitting;Standing;Lifting    How long can you sit comfortably?  45 minutes    How long can you stand comfortably?  30 minutes    Patient Stated Goals  to decrease pain/be comfortable    Currently in Pain?  Yes    Pain Score  5     Pain Location  Hip    Pain Orientation  Left    Pain Descriptors / Indicators  Aching  Multiple Pain Sites  Yes    Pain Score  2    Pain Location  Knee    Pain Orientation  Right    Pain Descriptors / Indicators  Aching                       [facility] Adult PT Treatment/Exercise - 12/25/17 0001      Lumbar Exercises: Aerobic   Nustep  L5 x 6 min (B UE/LE)      Lumbar Exercises: Standing   Functional Squats  10 reps    Functional Squats Limitations  with TRX cables for support; Cueing for good form to prevent knees from translating too far forward anteriorly, and for good depth not to break precautions.     Forward Lunge  10 reps    Forward Lunge Limitations  on each LE with TRX cables for support. VC for back foot positioning and appropriate depth not to break precautions      Lumbar Exercises: Seated   Long Arc Quad on Limon  10 reps    LAQ on [place] Limitations  Maintaining balance sitting on Orange PB while kicking ball against wall. Pt alternated kicking with each LE for 10 reps with VC to prevent bracing with UE  against nearby wall.     Other Seated Lumbar Exercises  Seated on Orange PB throwing/catching ball against wall ~3 feet away. pt able to catch ball with ~50% accuracy for 10 throws and reach to ground to pick up ball with good balance       Lumbar Exercises: Supine   Glut Set  10 reps;5 seconds    Glut Set Limitations  With B LEs on peanut ball     Straight Leg Raise  5 reps;Other (comment)    Straight Leg Raises Limitations  to ~6" height with 5 second isometric hold    Other Supine Lumbar Exercises  10 reps; 10 second hold. Bilateral straight leg raise to 6" with 50% assistance from PT for iniitiation of movement. Once at desired height pt able to maintain position with no assistance.       Lumbar Exercises: Prone   Other Prone Lumbar Exercises  --      Lumbar Exercises: Quadruped   Plank  10 reps; Modified plank from elbows with knees resting on mat x 8 reps with 10 second hold; True plank from elbows for last 2 reps with 10 second hold       Knee/Hip Exercises: Seated   Sit to Sand  10 reps;without UE support VC and demo for improved form with functional movement             PT Education - 12/25/17 1002    Education Details  Pt given education on better sit to stand strategy to prevent pain in the back and hip with movement.    Person(s) Educated  Patient    Methods  Explanation;Demonstration    Comprehension  Verbalized understanding;Returned demonstration       PT Short Term Goals - 12/14/17 0926      PT SHORT TERM GOAL #1   Title  Pt will be independent with initial HEP    Status  Achieved        PT Long Term Goals - 12/18/17 0937      PT LONG TERM GOAL #1   Title  Pt will be independent with advanced HEP    Status  On-going    Target Date  01/19/18  PT LONG TERM GOAL #2   Title  Pt to improve gross bilat LE strength to 4/5 to improve support to lumbar structures/musculature    Status  On-going    Target Date  01/19/18      PT LONG TERM GOAL #3    Title  Pt will be able to perform 10 sit to stands without increased pain    Status  On-going    Target Date  01/19/18      PT LONG TERM GOAL #4   Title  Pt will report ability to stand for greater than 30 min to improve ability to perform ADLs and care for family members    Status  On-going    Target Date  01/19/18            Plan - 12/25/17 1000    Clinical Impression Statement  Alpheus continues to make progress through this episode of care and demonstrates improved ability to contract core musculature to provide supports to structures of the back as well as greater stability for LE movements. Although his core strength continues to improve, he continues to demonstrate poor tolerance and increased fatigue with activities require core stabilization for balance as well as abnormal movement patterns such as when standing from sitting. He will continue to benefit from physical therapy to address core stability and lumbar musculature strength, as well as continue to address sit to stand movement pattern to minimize load on joints of the LE and decrease pain during movement.     Rehab Potential  Good    PT Treatment/Interventions  ADLs/Self Care Home Management;Electrical Stimulation;Iontophoresis 4mg /ml Dexamethasone;Therapeutic activities;Moist Heat;Therapeutic exercise;Functional mobility training;Stair training;Gait training;Neuromuscular re-education;Patient/family education;Manual techniques;Dry needling;Splinting;Taping;Scar mobilization    Consulted and Agree with Plan of Care  Patient       Patient will benefit from skilled therapeutic intervention in order to improve the following deficits and impairments:  Abnormal gait, Decreased activity tolerance, Decreased mobility, Decreased range of motion, Decreased strength, Pain, Improper body mechanics, Postural dysfunction, Impaired flexibility, Hypomobility  Visit Diagnosis: Chronic bilateral low back pain without sciatica  Muscle  weakness (generalized)  Other abnormalities of gait and mobility     Problem List Patient Active Problem List   Diagnosis Date Noted  . Right shoulder pain 01/30/2017  . Left shoulder pain 05/19/2016  . Urinary hesitancy 10/18/2014  . Trapezius muscle spasm 10/18/2014  . Idiopathic neuropathy 10/18/2014  . Allergic reaction 09/28/2014  . Hyperglycemia 09/28/2014  . Acute bacterial bronchitis 08/18/2014  . Hematuria 08/18/2014  . Screening, ischemic heart disease 06/16/2014  . Lumbar radiculopathy, chronic 05/19/2014  . Midline thoracic back pain 03/24/2014  . BPPV (benign paroxysmal positional vertigo) 01/20/2014  . Right-sided low back pain with right-sided sciatica 12/21/2013  . Lightheadedness 09/22/2013  . Porokeratosis 02/25/2013  . Pain in joint, ankle and foot 02/25/2013  . Lumbosacral pain 02/15/2013  . Abdominal pain, epigastric 02/15/2013  . Acid reflux 02/15/2013  . Hearing loss 02/15/2013  . Preventive measure 02/15/2013  . Chest pain 07/19/2012  . Bradycardia, sinus 07/19/2012  . Borderline glaucoma 03/15/2012  . 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 Dislocated inferior maxilla 01/10/2011  . H/O gastric ulcer 01/10/2011  . Back pain, chronic 11/21/2010    Shirline Frees, SPT 12/25/2017, 12:25 PM  [facility] [address]  [place], [place], [address] Phone: [phone]   Fax:  [phone]  Name: Brian [name] MRN: [id number] Date  of Birth: [birth year]

## 2017-12-28 ENCOUNTER — Encounter: Payer: Self-pay | Admitting: Physical Therapy

## 2017-12-28 ENCOUNTER — Ambulatory Visit: Payer: MEDICARE | Admitting: Physical Therapy

## 2017-12-28 DIAGNOSIS — G8929 Other chronic pain: Secondary | ICD-10-CM

## 2017-12-28 DIAGNOSIS — M6281 Muscle weakness (generalized): Secondary | ICD-10-CM

## 2017-12-28 DIAGNOSIS — M545 Low back pain, unspecified: Secondary | ICD-10-CM

## 2017-12-28 DIAGNOSIS — R2689 Other abnormalities of gait and mobility: Secondary | ICD-10-CM

## 2017-12-28 NOTE — Therapy (Signed)
[facility] [address]  [place], [place], [address] Phone: [phone]   Fax:  [phone]  Physical Therapy Treatment  Patient Details  Name: Brian [name] MRN: [id number] Date of Birth: Oct 06, 1945 Referring Provider: Bonnee Quin, PA-C   Encounter Date: 12/28/2017  PT End of Session - 12/28/17 1004    Visit Number  7    Number of Visits  12    Date for PT Re-Evaluation  01/19/18    Authorization Type  Medicare [employer] & [employer]    PT Start Time  [phone]    PT Stop Time  0931    PT Time Calculation (min)  45 min    Equipment Utilized During Treatment  Gait belt    Activity Tolerance  Patient tolerated treatment well    Behavior During Therapy  [facility] for tasks assessed/performed       Past Medical History:  Diagnosis Date  . Atypical chest pain     Negative cardiac catheterization 2014  . 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 Blind right eye   . 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 Chicken pox   . Chronic back pain   . Dislocation, jaw 1960s  . Edema    Left Leg  . Essential hypertension, benign   . GERD (gastroesophageal reflux disease)   . [place] measles   . Heart attack ([place])   . Hyperlipidemia   . Measles   . Mumps   . Osteopenia 11.12.10   bone density test  . Personal history of other diseases of digestive system 08.10.12   Gastric ulcer    Past Surgical History:  Procedure Laterality Date  . ABDOMINAL HERNIA REPAIR  1972  . APPENDECTOMY  1973  . CATARACT EXTRACTION, BILATERAL  2005  . CHOLECYSTECTOMY  08/2016  . ESOPHAGOGASTRODUODENOSCOPY  06.2011  . EYE SURGERY  1972   Bilateral Lens Replacement  . FACIAL RECONSTRUCTION SURGERY  2008  . LEFT HEART CATH  02.17.2014  . [place], 2008   Dislocated Jaw  . PARS PLANA VITRECTOMY W/ REPAIR OF MACULAR HOLE  2005   macular hole repair, right eye  . PARS PLANA VITRECTOMY W/ REPAIR OF MACULAR HOLE  2010   mechanical, left eye  . PROSTATE BIOPSY  [place] 2017   w/ Dr. [place] Crane  . WISDOM TOOTH EXTRACTION      There were no vitals filed for this visit.  Subjective Assessment - 12/28/17 1001    Subjective  Pt is well today with but with some soreness in the hips. Denies any pain today but in talking pt expresses concern about balance ability.     Pertinent History  Lumbar fusion    Limitations  Sitting;Standing;Lifting                       [facility] Adult PT Treatment/Exercise - 12/28/17 0001      Lumbar Exercises: Seated   Sit to Stand  10 reps;Other (comment)    Sit to Stand Limitations  From Green PB with VC for good form that was instructed last visit    Other Seated Lumbar Exercises  Sitting on Green PB; Diagonal pattern with UEs; Yellow TB; 10 reps each direction.     Other Seated Lumbar Exercises  Sitting on Green PB; Pallof press each direction with yellow TB; 10 reps With feet on AirEx          Balance  Exercises - 12/28/17 1008      Balance Exercises: Standing   Standing Eyes Opened  Narrow base of support (BOS);Foam/compliant surface;5 reps;30 secs;Other (comment)    Standing Eyes Closed  1 rep;30 secs;Foam/compliant surface    Tandem Stance  Eyes open;1 rep;15 secs    SLS  Eyes open;Intermittent upper extremity support;2 reps;10 secs    Gait with Head Turns  Forward;3 reps 2 reps horizontal head turns; 1 rep vertical; 3 x 30 ft    Tandem Gait  Forward;2 reps    Turning  Left    Other Standing Exercises  Standing on AirEx pad throwing/catching 1000 g medicine ball with therapist. PT attmped to take pt to edge of sway envelope but without breaking lumbar precautions. Activity limited to R side due to pt's visual deficits.       Balance Exercises: Standing   Standing Eyes Opened Limitations  3 reps x 30 sec on AirEx. PT provided perterbations in all directions with VC for hip muscle engagement and improved stability.     SLS Limitations  Each LE    Tandem Gait  Limitations  3 reps at counter; VC for decreased speed as pt increased velocity to compensate for decreased balance    Turning Limitations  Pt turned to the L with good speed and 2 small steps to recover balance when stopped        PT Education - 12/28/17 1003    Education Details  Pt provided with HEP update today    Person(s) Educated  Patient    Methods  Handout;Explanation    Comprehension  Verbalized understanding       PT Short Term Goals - 12/14/17 0926      PT SHORT TERM GOAL #1   Title  Pt will be independent with initial HEP    Status  Achieved        PT Long Term Goals - 12/28/17 1245      PT LONG TERM GOAL #1   Title  Pt will be independent with advanced HEP    Status  On-going      PT LONG TERM GOAL #2   Title  Pt to improve gross bilat LE strength to 4/5 to improve support to lumbar structures/musculature    Status  On-going      PT LONG TERM GOAL #3   Title  Pt will be able to perform 10 sit to stands without increased pain    Status  On-going      PT LONG TERM GOAL #4   Title  Pt will report ability to stand for greater than 30 min to improve ability to perform ADLs and care for family members    Status  On-going      PT LONG TERM GOAL #5   Title  Patient will demonstrate ability to walk with head turns and maintain gait within a 12 inch pathway to improve ability to safely amb within community and decrease fall risk.     Status  New    Target Date  01/19/18            Plan - 12/28/17 1004    Clinical Impression Statement  Today's session focused on pt's ability to recruit and use hip and core musculature to maintain balance and prepare for perturbations and other obstacles that will be encountered in daily life. During session pt initially demonstrated an impaired ability to recruit hip musculature for hip strategy to maintain balance. With VC and  TC pt eventually able to maintain upright position when being perturbed by PT with good stability.  Pt will continue to benefit from physical therapy to address his LE strength deficits, decreased core stability, improve his ability to perform functional activities, and progress toward functional goals.     Clinical Impairments Affecting Rehab Potential  Hearing difficulty, R eye visual deficits     PT Treatment/Interventions  ADLs/Self Care Home Management;Electrical Stimulation;Iontophoresis 4mg /ml Dexamethasone;Therapeutic activities;Moist Heat;Therapeutic exercise;Functional mobility training;Stair training;Gait training;Neuromuscular re-education;Patient/family education;Manual techniques;Dry needling;Splinting;Taping;Scar mobilization    Consulted and Agree with Plan of Care  Patient       Patient will benefit from skilled therapeutic intervention in order to improve the following deficits and impairments:  Abnormal gait, Decreased activity tolerance, Decreased mobility, Decreased range of motion, Decreased strength, Pain, Improper body mechanics, Postural dysfunction, Impaired flexibility, Hypomobility  Visit Diagnosis: No diagnosis found.     Problem List Patient Active Problem List   Diagnosis Date Noted  . Right shoulder pain 01/30/2017  . Left shoulder pain 05/19/2016  . Urinary hesitancy 10/18/2014  . Trapezius muscle spasm 10/18/2014  . Idiopathic neuropathy 10/18/2014  . Allergic reaction 09/28/2014  . Hyperglycemia 09/28/2014  . Acute bacterial bronchitis 08/18/2014  . Hematuria 08/18/2014  . Screening, ischemic heart disease 06/16/2014  . Lumbar radiculopathy, chronic 05/19/2014  . Midline thoracic back pain 03/24/2014  . BPPV (benign paroxysmal positional vertigo) 01/20/2014  . Right-sided low back pain with right-sided sciatica 12/21/2013  . Lightheadedness 09/22/2013  . Porokeratosis 02/25/2013  . Pain in joint, ankle and foot 02/25/2013  . Lumbosacral pain 02/15/2013  . Abdominal pain, epigastric 02/15/2013  . Acid reflux 02/15/2013  . Hearing loss  02/15/2013  . Preventive measure 02/15/2013  . Chest pain 07/19/2012  . Bradycardia, sinus 07/19/2012  . Borderline glaucoma 03/15/2012  . 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 Dislocated inferior maxilla 01/10/2011  . H/O gastric ulcer 01/10/2011  . Back pain, chronic 11/21/2010    Shirline Frees, SPT 12/28/2017, 12:46 PM  [facility] [address]  [place], [place], [address] Phone: [phone]   Fax:  [phone]  Name: NATIVIDAD HALLS MRN: [id number] Date of Birth: April 10, 1946

## 2017-12-31 ENCOUNTER — Encounter: Payer: Self-pay | Admitting: Physical Therapy

## 2017-12-31 ENCOUNTER — Ambulatory Visit: Payer: MEDICARE | Attending: Neurosurgery | Admitting: Physical Therapy

## 2017-12-31 DIAGNOSIS — G8929 Other chronic pain: Secondary | ICD-10-CM | POA: Diagnosis present

## 2017-12-31 DIAGNOSIS — M545 Low back pain, unspecified: Secondary | ICD-10-CM

## 2017-12-31 DIAGNOSIS — R2689 Other abnormalities of gait and mobility: Secondary | ICD-10-CM | POA: Insufficient documentation

## 2017-12-31 DIAGNOSIS — M6281 Muscle weakness (generalized): Secondary | ICD-10-CM | POA: Insufficient documentation

## 2017-12-31 NOTE — Therapy (Addendum)
[facility] [address]  [place], [place], [address] Phone: [phone]   Fax:  [phone]  Physical Therapy Treatment  Patient Details  Name: Brian [name] MRN: [id number] Date of Birth: 1945-09-29 Referring Provider: Bonnee Quin, PA-C   Encounter Date: 12/31/2017  PT End of Session - 12/31/17 0929    Visit Number  8    Number of Visits  12    Date for PT Re-Evaluation  01/19/18    Authorization Type  Medicare [employer] & [employer]    PT Start Time  [phone]    PT Stop Time  0926    PT Time Calculation (min)  50 min    Activity Tolerance  Patient tolerated treatment well    Behavior During Therapy  [facility] for tasks assessed/performed       Past Medical History:  Diagnosis Date  . Atypical chest pain     Negative cardiac catheterization 2014  . 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 Blind right eye   . 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 Chicken pox   . Chronic back pain   . Dislocation, jaw 1960s  . Edema    Left Leg  . Essential hypertension, benign   . GERD (gastroesophageal reflux disease)   . [place] measles   . Heart attack ([place])   . Hyperlipidemia   . Measles   . Mumps   . Osteopenia 11.12.10   bone density test  . Personal history of other diseases of digestive system 08.10.12   Gastric ulcer    Past Surgical History:  Procedure Laterality Date  . ABDOMINAL HERNIA REPAIR  1972  . APPENDECTOMY  1973  . CATARACT EXTRACTION, BILATERAL  2005  . CHOLECYSTECTOMY  08/2016  . ESOPHAGOGASTRODUODENOSCOPY  06.2011  . EYE SURGERY  1972   Bilateral Lens Replacement  . FACIAL RECONSTRUCTION SURGERY  2008  . LEFT HEART CATH  02.17.2014  . [place], 2008   Dislocated Jaw  . PARS PLANA VITRECTOMY W/ REPAIR OF MACULAR HOLE  2005   macular hole repair, right eye  . PARS PLANA VITRECTOMY W/ REPAIR OF MACULAR HOLE  2010   mechanical, left eye  . PROSTATE BIOPSY  [place] 2017    w/ Dr. Nevada Crane  . WISDOM TOOTH EXTRACTION      There were no vitals filed for this visit.  Subjective Assessment - 12/31/17 0840    Subjective  Pt states that he is well today with but with some pain in the knees. No concerns after last visit.     Pertinent History  Lumbar fusion    Limitations  Sitting;Standing;Lifting    Patient Stated Goals  to decrease pain/be comfortable    Currently in Pain?  Yes    Pain Score  5     Pain Location  Hip    Pain Orientation  Left    Pain Descriptors / Indicators  Aching    Pain Type  Chronic pain    Multiple Pain Sites  No                       [facility] Adult PT Treatment/Exercise - 12/31/17 0001      Self-Care   Self-Care  Other Self-Care Comments    Other Self-Care Comments   Pt instructed on how to use tennis ball or similar object to address trigger points and tightness in upper trap musculature at home  Lumbar Exercises: Aerobic   Nustep  L6 x 6 min (LE only)      Lumbar Exercises: Machines for Strengthening   Other Lumbar Machine Exercise  Lat pull downs, PT assisted to obtain position without breaking overhead precautions. Pulling down from 90 degrees of shoulder flexion with good control back up; 15 reps; 15#.  Standing on AirEx pad     Other Lumbar Machine Exercise  B Pallof Press at [place]; standing on AirEx pad. 5#; 10 reps A/P      Lumbar Exercises: Standing   Other Standing Lumbar Exercises  --      Knee/Hip Exercises: Stretches   Active Hamstring Stretch  Both;30 seconds;3 reps      Knee/Hip Exercises: Standing   Forward Step Up  10 reps;Hand Hold: 0;Both;2 sets    Forward Step Up Limitations  Second sets onto 9" step with AirEx pad on top. CGA assist Pt experiened 2 LOB backwards.    Wall Squat  10 reps    Wall Squat Limitations  Against Orange PB on wall, VC to prevent breaking lumbar precaustions and for foot positioning to reduce knee joint load    Stairs  3 flights of 12 stairs; 1 UE assist for 2  reps; no UE assist with VC for adequate weight shifting and preventing posterior lean for last 2 sets    Rocker Board  5 minutes    Rocker Board Limitations  Pt instructed to shift weight forward and touch the front of the board to the ground and then back to neutral with slow and controlled movement. In static standing pt stood with heels lower than forefeet, and VC required from therapist to obtain and maintain neutral standing position without resting weight on heels      Shoulder Exercises: Therapy Ball   Flexion  Both;15 reps    Flexion Limitations  Against Orange PB on wall; reaching up to highest available ROM without lumbar extension               PT Short Term Goals - 12/14/17 0926      PT SHORT TERM GOAL #1   Title  Pt will be independent with initial HEP    Status  Achieved        PT Long Term Goals - 12/28/17 1245      PT LONG TERM GOAL #1   Title  Pt will be independent with advanced HEP    Status  On-going      PT LONG TERM GOAL #2   Title  Pt to improve gross bilat LE strength to 4/5 to improve support to lumbar structures/musculature    Status  On-going      PT LONG TERM GOAL #3   Title  Pt will be able to perform 10 sit to stands without increased pain    Status  On-going      PT LONG TERM GOAL #4   Title  Pt will report ability to stand for greater than 30 min to improve ability to perform ADLs and care for family members    Status  On-going      PT LONG TERM GOAL #5   Title  Patient will demonstrate ability to walk with head turns and maintain gait within a 12 inch pathway to improve ability to safely amb within community and decrease fall risk.     Status  New    Target Date  01/19/18  Plan - 12/31/17 1119    Clinical Impression Statement  Login is continuing to progress with each visit and demonstrates improved LE stability and ability to stabilize the core musculature when moving the extremities. He continues to experience pain  in the hips and knees with activities that he attributes to arthritis. Today's session focused on core stabilization and appropriate postural response with activities including stair climbing and standing on the rockerboard. Secondary to pain and habitual movement patterns, pt demonstrates preference toward posterior lean and requires cueing to maintain a neutral posture when performing activities and when on rockerboard. This posturing places the patient at an increased risk for LOB in the posterior direction during functional activities. He will continue to benefit from physical therapy to retrain movement patterns to decrease risk for falling, as well as continue to provide core stabilization and progress toward functional goals.     Rehab Potential  Good    Clinical Impairments Affecting Rehab Potential  Hearing difficulty, R eye visual deficits     PT Treatment/Interventions  ADLs/Self Care Home Management;Electrical Stimulation;Iontophoresis 95m/ml Dexamethasone;Therapeutic activities;Moist Heat;Therapeutic exercise;Functional mobility training;Stair training;Gait training;Neuromuscular re-education;Patient/family education;Manual techniques;Dry needling;Splinting;Taping;Scar mobilization    Consulted and Agree with Plan of Care  Patient       Patient will benefit from skilled therapeutic intervention in order to improve the following deficits and impairments:  Abnormal gait, Decreased activity tolerance, Decreased mobility, Decreased range of motion, Decreased strength, Pain, Improper body mechanics, Postural dysfunction, Impaired flexibility, Hypomobility  Visit Diagnosis: Chronic bilateral low back pain without sciatica  Muscle weakness (generalized)  Other abnormalities of gait and mobility     Problem List Patient Active Problem List   Diagnosis Date Noted  . Right shoulder pain 01/30/2017  . Left shoulder pain 05/19/2016  . Urinary hesitancy 10/18/2014  . Trapezius muscle spasm  10/18/2014  . Idiopathic neuropathy 10/18/2014  . Allergic reaction 09/28/2014  . Hyperglycemia 09/28/2014  . Acute bacterial bronchitis 08/18/2014  . Hematuria 08/18/2014  . Screening, ischemic heart disease 06/16/2014  . Lumbar radiculopathy, chronic 05/19/2014  . Midline thoracic back pain 03/24/2014  . BPPV (benign paroxysmal positional vertigo) 01/20/2014  . Right-sided low back pain with right-sided sciatica 12/21/2013  . Lightheadedness 09/22/2013  . Porokeratosis 02/25/2013  . Pain in joint, ankle and foot 02/25/2013  . Lumbosacral pain 02/15/2013  . Abdominal pain, epigastric 02/15/2013  . Acid reflux 02/15/2013  . Hearing loss 02/15/2013  . Preventive measure 02/15/2013  . Chest pain 07/19/2012  . Bradycardia, sinus 07/19/2012  . Borderline glaucoma 03/15/2012  . 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 Dislocated inferior maxilla 01/10/2011  . H/O gastric ulcer 01/10/2011  . Back pain, chronic 11/21/2010    JShirline Frees SPT 12/31/2017, 11:47 AM  [facility] [place] [address]Phone: [phone]  Fax:  [phone] Name: AGILMER KAMINSKYMRN: [id number] of Birth: 626-Dec-1947 PHYSICAL THERAPY DISCHARGE SUMMARY  Visits from Start of Care: 8  Current functional level related to goals / functional outcomes:   Pt called to cancel all remaining appts due to worsening pain and has not returned in >30 days.   Remaining deficits:   As above.   Education / Equipment:   HEP  Plan: Patient agrees to discharge.  Patient goals were not met. Patient is being discharged due to not returning since the last visit.  ?????     JoAnne M.  Luis Abed, PT, MPT 02/05/18, 9:50 AM  [facility] [address]  [place], [place], [address] Phone: [phone]   Fax:  [phone]

## 2018-01-04 ENCOUNTER — Ambulatory Visit: Payer: MEDICARE | Admitting: Physical Therapy

## 2018-01-07 ENCOUNTER — Encounter: Payer: MEDICARE | Admitting: Physical Therapy

## 2018-01-14 ENCOUNTER — Encounter: Payer: MEDICARE | Admitting: Physical Therapy

## 2018-01-18 ENCOUNTER — Encounter: Payer: MEDICARE | Admitting: Physical Therapy

## 2018-01-25 ENCOUNTER — Encounter: Payer: MEDICARE | Admitting: Physical Therapy

## 2018-01-26 ENCOUNTER — Ambulatory Visit: Payer: MEDICARE | Admitting: Physical Therapy

## 2018-01-28 ENCOUNTER — Encounter: Payer: Self-pay | Admitting: Medical

## 2018-01-28 ENCOUNTER — Ambulatory Visit (INDEPENDENT_AMBULATORY_CARE_PROVIDER_SITE_OTHER): Payer: MEDICARE | Admitting: Medical

## 2018-01-28 ENCOUNTER — Ambulatory Visit (HOSPITAL_BASED_OUTPATIENT_CLINIC_OR_DEPARTMENT_OTHER)
Admission: RE | Admit: 2018-01-28 | Discharge: 2018-01-28 | Disposition: A | Discharge status: Home / Self Care | Payer: MEDICARE | Source: Ambulatory Visit | Attending: Medical | Admitting: Medical

## 2018-01-28 VITALS — BP 119/69 | HR 50 | Temp 98.0°F | Resp 16 | Ht 64.0 in | Wt 183.4 lb

## 2018-01-28 DIAGNOSIS — M79672 Pain in left foot: Secondary | ICD-10-CM | POA: Insufficient documentation

## 2018-01-28 DIAGNOSIS — M25511 Pain in right shoulder: Secondary | ICD-10-CM | POA: Diagnosis present

## 2018-01-28 DIAGNOSIS — M25512 Pain in left shoulder: Secondary | ICD-10-CM

## 2018-01-28 DIAGNOSIS — M79671 Pain in right foot: Secondary | ICD-10-CM | POA: Diagnosis not present

## 2018-01-28 DIAGNOSIS — Z8739 Personal history of other diseases of the musculoskeletal system and connective tissue: Secondary | ICD-10-CM

## 2018-01-28 NOTE — Patient Instructions (Signed)
For your recent bilateral shoulder pain after fall, we will get x-rays of both shoulders.  You do have history of shoulder pain in the past and Dr. Barbaraann Barthel follow-up that you had likely rotator cuff injuries.  You never got MRI as he suggested so I will refer you back to him for reevaluation.  He might have a rotator cuff pain or possibly onset of some frozen shoulder.  You can use diclofenac for mild to moderate pain and tramadol for severe pain.  You both medications at your house.  Since you do have some arthritic changes of the neck in the past and you are reporting some radicular type pain to the left upper extremity, I will go ahead and repeat your cervical spine x-ray.  Also for your bilateral feet pain, I will get x-rays.  You do have history of some balance issues and would recommend that you continue with the physical therapy.  Also be careful on standing before you ambulate.  In addition recommend that she use the walker.  Follow-up as needed with [place] after seeing sports medicine.

## 2018-01-28 NOTE — Progress Notes (Signed)
Subjective:    Patient ID: Brian [name], male    DOB: February 17, 1946, 72 y.o.   MRN: [id number]  HPI  Pt in with bilateral trapezius pain and shoulder pain. Pt fell on carpet backwards and caught himself. This is when he first noted the pain after fall 2 weeks ago. No loc with fall. No precding cardiac or neurologic symptoms. Just lost balance.  Pt also report daily bottom of both feet pain.  He states some pain on left shoulder radiates down toward left hand.  Pain on both sides worse with movement.  Pt does have history of falls. Pt has been seeing PT in past for falls. He does not use walker. Often times he falls when standing up or stepping back.       Review of Systems  Constitutional: Negative for chills and fatigue.  Respiratory: Negative for cough, chest tightness, shortness of breath and wheezing.   Cardiovascular: Negative for chest pain and palpitations.  Gastrointestinal: Negative for abdominal pain.  Musculoskeletal:       See hpi.  Skin: Negative for rash.  Neurological: Negative for dizziness, syncope, speech difficulty and numbness.  Hematological: Negative for adenopathy. Does not bruise/bleed easily.    Past Medical History:  Diagnosis Date  . Atypical chest pain     Negative cardiac catheterization 2014  . 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 Blind right eye   . 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 Chicken pox   . Chronic back pain   . Dislocation, jaw 1960s  . Edema    Left Leg  . Essential hypertension, benign   . GERD (gastroesophageal reflux disease)   . [place] measles   . Heart attack ([place])   . Hyperlipidemia   . Measles   . Mumps   . Osteopenia 11.12.10   bone density test  . Personal history of other diseases of digestive system 08.10.12   Gastric ulcer     Social History   Socioeconomic History  . Marital status: Single    Spouse name: Not on file  . Number of children: Not on file  . Years of education: Not  on file  . Highest education level: Not on file  Occupational History  . Not on file  Social Needs  . Financial resource strain: Not on file  . Food insecurity:    Worry: Not on file    Inability: Not on file  . Transportation needs:    Medical: Not on file    Non-medical: Not on file  Tobacco Use  . Smoking status: Former Smoker    Packs/day: 0.25    Years: 2.00    Pack years: 0.50    Types: Cigarettes    Last attempt to quit: 06/02/1966    Years since quitting: 51.6  . Smokeless tobacco: Never Used  Substance and Sexual Activity  . Alcohol use: No    Alcohol/week: 0.0 standard drinks  . Drug use: No  . Sexual activity: Never  Lifestyle  . Physical activity:    Days per week: Not on file    Minutes per session: Not on file  . Stress: Not on file  Relationships  . Social connections:    Talks on phone: Not on file    Gets together: Not on file    Attends religious service: Not on file    Active member of club or organization: Not on file    Attends meetings of clubs or organizations: Not on file  Relationship status: Not on file  . Intimate partner violence:    Fear of current or ex partner: Not on file    Emotionally abused: Not on file    Physically abused: Not on file    Forced sexual activity: Not on file  Other Topics Concern  . Not on file  Social History Narrative  . Not on file    Past Surgical History:  Procedure Laterality Date  . ABDOMINAL HERNIA REPAIR  1972  . APPENDECTOMY  1973  . CATARACT EXTRACTION, BILATERAL  2005  . CHOLECYSTECTOMY  08/2016  . ESOPHAGOGASTRODUODENOSCOPY  06.2011  . EYE SURGERY  1972   Bilateral Lens Replacement  . FACIAL RECONSTRUCTION SURGERY  2008  . LEFT HEART CATH  02.17.2014  . [place], 2008   Dislocated Jaw  . PARS PLANA VITRECTOMY W/ REPAIR OF MACULAR HOLE  2005   macular hole repair, right eye  . PARS PLANA VITRECTOMY W/ REPAIR OF MACULAR HOLE  2010   mechanical, left eye  . PROSTATE BIOPSY   [place] 2017   w/ Dr. [place] Crane  . WISDOM TOOTH EXTRACTION      Family History  Problem Relation Age of Onset  . Alzheimer's disease Mother        Deceased  . Diabetes Maternal Grandmother   . Heart attack Maternal Grandmother   . Prostate cancer Maternal Uncle   . Diabetes Maternal Uncle   . Healthy Son   . Cataracts Maternal Aunt     Allergies  Allergen Reactions  . Gabapentin Other (See Comments)    Feel "weird"; Nausea; Weakness; Syncope    Current Outpatient Medications on File Prior to Visit  Medication Sig Dispense Refill  . aspirin (ASPIR-81) 81 MG EC tablet Take 1 tablet by mouth daily.    Marland Kitchen b complex vitamins tablet Take 1 tablet by mouth daily.    . benzonatate (TESSALON) 100 MG capsule Take 1 capsule (100 mg total) by mouth 3 (three) times daily as needed. 30 capsule 0  . Calcium Carbonate-Vitamin D (CALCIUM-VITAMIN D3 PO) Take by mouth daily.    . ciprofloxacin (CIPRO) 500 MG tablet Take 1 tablet (500 mg total) by mouth 2 (two) times daily. 20 tablet 0  . cyclobenzaprine (FLEXERIL) 5 MG tablet Take 1 tablet (5 mg total) by mouth at bedtime. 7 tablet 0  . diclofenac (VOLTAREN) 75 MG EC tablet Take 1 tablet (75 mg total) by mouth 2 (two) times daily. 20 tablet 0  . doxycycline (VIBRA-TABS) 100 MG tablet Take 1 tablet (100 mg total) by mouth 2 (two) times daily. Can give caps or generic 20 tablet 0  . hydrochlorothiazide (MICROZIDE) 12.5 MG capsule Take 1 capsule (12.5 mg total) by mouth daily. 90 capsule 3  . levocetirizine (XYZAL) 5 MG tablet Take 1 tablet (5 mg total) by mouth every evening. 30 tablet 0  . methylPREDNISolone (MEDROL DOSEPAK) 4 MG TBPK tablet follow package directions    . mirabegron ER (MYRBETRIQ) 50 MG TB24 tablet Take 50 mg by mouth daily.    . ondansetron (ZOFRAN ODT) 8 MG disintegrating tablet Take 1 tablet (8 mg total) by mouth every 8 (eight) hours as needed for nausea or vomiting. 20 tablet 0  . pantoprazole (PROTONIX) 40 MG tablet Take 1 tablet (40  mg total) by mouth 2 (two) times daily. 180 tablet 3  . simvastatin (ZOCOR) 40 MG tablet Take 1 tablet (40 mg total) by mouth every evening. 90 tablet 3  . tamsulosin (  FLOMAX) 0.4 MG CAPS capsule Take 2 capsules (0.8 mg total) by mouth daily. 90 capsule 1  . timolol (TIMOPTIC) 0.5 % ophthalmic solution Place 1 drop into the left eye daily. 10 mL 12  . traMADol (ULTRAM) 50 MG tablet 1 tab po q 8 hours prn pain 15 tablet 0  . triamcinolone ointment (KENALOG) 0.5 % Apply 1 application topically 2 (two) times daily. 30 g 0   No current facility-administered medications on file prior to visit.     BP 119/69   Pulse (!) 50   Temp 98 F (36.7 C) (Oral)   Resp 16   Ht 5\' 4"  (1.626 m)   Wt 183 lb 6.4 oz (83.2 kg)   SpO2 98%   BMI 31.48 kg/m       Objective:   Physical Exam   General Mental Status- Alert. General Appearance- Not in acute distress.   Skin General: Color- Normal Color. Moisture- Normal Moisture.  Neck Carotid Arteries- Normal color. Moisture- Normal Moisture. No carotid bruits. No JVD. Left side trapezius pain on palpation.  Chest and Lung Exam Auscultation: Breath Sounds:-Normal.  Cardiovascular Auscultation:Rythm- Regular. Murmurs & Other Heart Sounds:Auscultation of the heart reveals- No Murmurs.  Abdomen Inspection:-Inspeection Normal. Palpation/Percussion:Note:No mass. Palpation and Percussion of the abdomen reveal- Non Tender, Non Distended + BS, no rebound or guarding.    Neurologic Cranial Nerve exam:- CN III-XII intact(No nystagmus), symmetric smile. Strength:- 5/5 equal and symmetric strength both upper and lower extremities.  Both shoulder- pain on lifting arms/abduction. Can't lift arms above shoulder. Left arm hurts worse. Both sides shoulder pain on palpation.   Both upper ext- no elbow pain on palpation or rom. Hands- good grip strength.  Neck- no mid cspine pain but tender trapezius bilaterally.  Both feet- mid faint metatarsal  tenderness to palpation.    Assessment & Plan:  For your recent bilateral shoulder pain after fall, we will get x-rays of both shoulders.  You do have history of shoulder pain in the past and Dr. Barbaraann Barthel follow-up that you had likely rotator cuff injuries.  You never got MRI as he suggested so I will refer you back to him for reevaluation.  He might have a rotator cuff pain or possibly onset of some frozen shoulder.  You can use diclofenac for mild to moderate pain and tramadol for severe pain.  You both medications at your house.  Since you do have some arthritic changes of the neck in the past and you are reporting some radicular type pain to the left upper extremity, I will go ahead and repeat your cervical spine x-ray.  Also for your bilateral feet pain, I will get x-rays.  You do have history of some balance issues and would recommend that you continue with the physical therapy.  Also be careful on standing before you ambulate.  In addition recommend that she use the walker.  Follow-up as needed with [place] after seeing sports medicine.  Mackie Pai, PA-C

## 2018-02-02 ENCOUNTER — Ambulatory Visit (INDEPENDENT_AMBULATORY_CARE_PROVIDER_SITE_OTHER): Payer: MEDICARE | Admitting: Family Medicine

## 2018-02-02 ENCOUNTER — Encounter: Payer: Self-pay | Admitting: Family Medicine

## 2018-02-02 DIAGNOSIS — M25512 Pain in left shoulder: Secondary | ICD-10-CM | POA: Diagnosis present

## 2018-02-02 DIAGNOSIS — M25511 Pain in right shoulder: Secondary | ICD-10-CM

## 2018-02-02 DIAGNOSIS — G8929 Other chronic pain: Secondary | ICD-10-CM | POA: Diagnosis not present

## 2018-02-02 MED ORDER — METHYLPREDNISOLONE ACETATE 40 MG/ML IJ SUSP
40.0000 mg | Freq: Once | INTRAMUSCULAR | Status: AC
  Administered 2018-02-02: 40 mg via INTRA_ARTICULAR

## 2018-02-02 NOTE — Progress Notes (Signed)
PCP: Mackie Pai, PA-C  Subjective:   HPI: Patient is a 72 y.o. male here for bilateral shoulder pain.  Patient has history of chronic right shoulder pain. Did not notice much benefit in past with subacromial injection, oral prednisone dose pack. States about 2 weeks ago he fell backwards directly onto back, posterior shoulders. Pain level now 6/10 left shoulder, 5/10 right and sharp. Worse with movement, trying to reach overhead. Some radiation into left arm with numbness on radial side of forearm. Tried tramadol but no other medications. Pain in both shoulder is primarily superior and lateral. States he is not sleeping but not due to his shoulders. No skin changes. Some numbness only in left wrist area.  Past Medical History:  Diagnosis Date  . Atypical chest pain     Negative cardiac catheterization 2014  . 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 Blind right eye   . 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 Chicken pox   . Chronic back pain   . Dislocation, jaw 1960s  . Edema    Left Leg  . Essential hypertension, benign   . GERD (gastroesophageal reflux disease)   . [place] measles   . Heart attack ([place])   . Hyperlipidemia   . Measles   . Mumps   . Osteopenia 11.12.10   bone density test  . Personal history of other diseases of digestive system 08.10.12   Gastric ulcer    Current Outpatient Medications on File Prior to Visit  Medication Sig Dispense Refill  . aspirin (ASPIR-81) 81 MG EC tablet Take 1 tablet by mouth daily.    Marland Kitchen b complex vitamins tablet Take 1 tablet by mouth daily.    . benzonatate (TESSALON) 100 MG capsule Take 1 capsule (100 mg total) by mouth 3 (three) times daily as needed. 30 capsule 0  . Calcium Carbonate-Vitamin D (CALCIUM-VITAMIN D3 PO) Take by mouth daily.    . ciprofloxacin (CIPRO) 500 MG tablet Take 1 tablet (500 mg total) by mouth 2 (two) times daily. 20 tablet 0  . cyclobenzaprine (FLEXERIL) 5 MG tablet Take 1 tablet  (5 mg total) by mouth at bedtime. 7 tablet 0  . diclofenac (VOLTAREN) 75 MG EC tablet Take 1 tablet (75 mg total) by mouth 2 (two) times daily. 20 tablet 0  . doxycycline (VIBRA-TABS) 100 MG tablet Take 1 tablet (100 mg total) by mouth 2 (two) times daily. Can give caps or generic 20 tablet 0  . hydrochlorothiazide (MICROZIDE) 12.5 MG capsule Take 1 capsule (12.5 mg total) by mouth daily. 90 capsule 3  . levocetirizine (XYZAL) 5 MG tablet Take 1 tablet (5 mg total) by mouth every evening. 30 tablet 0  . methylPREDNISolone (MEDROL DOSEPAK) 4 MG TBPK tablet follow package directions    . mirabegron ER (MYRBETRIQ) 50 MG TB24 tablet Take 50 mg by mouth daily.    . ondansetron (ZOFRAN ODT) 8 MG disintegrating tablet Take 1 tablet (8 mg total) by mouth every 8 (eight) hours as needed for nausea or vomiting. 20 tablet 0  . pantoprazole (PROTONIX) 40 MG tablet Take 1 tablet (40 mg total) by mouth 2 (two) times daily. 180 tablet 3  . simvastatin (ZOCOR) 40 MG tablet Take 1 tablet (40 mg total) by mouth every evening. 90 tablet 3  . tamsulosin (FLOMAX) 0.4 MG CAPS capsule Take 2 capsules (0.8 mg total) by mouth daily. 90 capsule 1  . timolol (TIMOPTIC) 0.5 % ophthalmic solution Place 1 drop into the left eye daily.  10 mL 12  . traMADol (ULTRAM) 50 MG tablet 1 tab po q 8 hours prn pain 15 tablet 0  . triamcinolone ointment (KENALOG) 0.5 % Apply 1 application topically 2 (two) times daily. 30 g 0   No current facility-administered medications on file prior to visit.     Past Surgical History:  Procedure Laterality Date  . ABDOMINAL HERNIA REPAIR  1972  . APPENDECTOMY  1973  . CATARACT EXTRACTION, BILATERAL  2005  . CHOLECYSTECTOMY  08/2016  . ESOPHAGOGASTRODUODENOSCOPY  06.2011  . EYE SURGERY  1972   Bilateral Lens Replacement  . FACIAL RECONSTRUCTION SURGERY  2008  . LEFT HEART CATH  02.17.2014  . [place], 2008   Dislocated Jaw  . PARS PLANA VITRECTOMY W/ REPAIR OF MACULAR HOLE   2005   macular hole repair, right eye  . PARS PLANA VITRECTOMY W/ REPAIR OF MACULAR HOLE  2010   mechanical, left eye  . PROSTATE BIOPSY  [place] 2017   w/ Dr. [place] Crane  . WISDOM TOOTH EXTRACTION      Allergies  Allergen Reactions  . Gabapentin Other (See Comments)    Feel "weird"; Nausea; Weakness; Syncope    Social History   Socioeconomic History  . Marital status: Single    Spouse name: Not on file  . Number of children: Not on file  . Years of education: Not on file  . Highest education level: Not on file  Occupational History  . Not on file  Social Needs  . Financial resource strain: Not on file  . Food insecurity:    Worry: Not on file    Inability: Not on file  . Transportation needs:    Medical: Not on file    Non-medical: Not on file  Tobacco Use  . Smoking status: Former Smoker    Packs/day: 0.25    Years: 2.00    Pack years: 0.50    Types: Cigarettes    Last attempt to quit: 06/02/1966    Years since quitting: 51.7  . Smokeless tobacco: Never Used  Substance and Sexual Activity  . Alcohol use: No    Alcohol/week: 0.0 standard drinks  . Drug use: No  . Sexual activity: Never  Lifestyle  . Physical activity:    Days per week: Not on file    Minutes per session: Not on file  . Stress: Not on file  Relationships  . Social connections:    Talks on phone: Not on file    Gets together: Not on file    Attends religious service: Not on file    Active member of club or organization: Not on file    Attends meetings of clubs or organizations: Not on file    Relationship status: Not on file  . Intimate partner violence:    Fear of current or ex partner: Not on file    Emotionally abused: Not on file    Physically abused: Not on file    Forced sexual activity: Not on file  Other Topics Concern  . Not on file  Social History Narrative  . Not on file    Family History  Problem Relation Age of Onset  . Alzheimer's disease Mother        Deceased  . Diabetes  Maternal Grandmother   . Heart attack Maternal Grandmother   . Prostate cancer Maternal Uncle   . Diabetes Maternal Uncle   . Healthy Son   . Cataracts Maternal Aunt  BP 126/79   Pulse (!) 51   Ht 5\' 6"  (1.676 m)   Wt 184 lb (83.5 kg)   BMI 29.70 kg/m   Review of Systems: See HPI above.     Objective:  Physical Exam:  Gen: NAD, comfortable in exam room  Right shoulder: No swelling, ecchymoses.  No gross deformity. Mild TTP posteriorly over lateral trapezius, infraspinatus.. ROM limited to 90 degrees flexion and abduction, 70 ER.  Pain with all motions. Negative Hawkins, Neers. Negative Yergasons. Strength 5/5 with empty can and resisted internal/external rotation.  Pain empty can and ER. Negative apprehension. NV intact distally.  Left shoulder: No swelling, ecchymoses.  No gross deformity. Mild TTP posteriorly over lateral trapezius, infraspinatus.. ROM limited to 90 degrees flexion and abduction, 70 ER.  Pain with all motions. Positive Hawkins, Neers. Negative Yergasons. Strength 5/5 with empty can and resisted internal/external rotation.  Pain empty can and ER. Negative apprehension. NV intact distally.   Assessment & Plan:  1. Bilateral shoulder pain - independently reviewed radiographs and no evidence fracture, other bony abnormalities.  Noted mild glenohumeral arthritis.  Exam also consistent with rotator cuff impingement but without full thickness tear - still with good strength on exam.  For right shoulder previously he's struggled with chronic pain - not improved with prednisone dose pack or subacromial injection, home exercises.  We discussed options - he would like to try combination injections for both shoulders.  Tylenol, tramadol if needed.  F/u in 1 month.  Consider PT, ultrasound.  After informed written consent timeout was performed, patient was seated on exam table. Right shoulder was prepped with alcohol swab and utilizing posterior approach,  patient's right shoulder was injected with 6:2 bupivicaine:depomedrol with half in the subacromial space and half in glenohumeral space.  Patient tolerated the procedure well without immediate complications.  After informed written consent timeout was performed, patient was seated on exam table. Left shoulder was prepped with alcohol swab and utilizing posterior approach, patient's left shoulder was injected with 6:2 bupivicaine:depomedrol with half in the subacromial space and half in glenohumeral space.  Patient tolerated the procedure well without immediate complications.

## 2018-02-02 NOTE — Patient Instructions (Signed)
Your x-rays are reassuring, no evidence fracture. You have mild arthritis of your shoulder joint as well as rotator cuff impingement. You were given combination shoulder injections today. Tylenol, tramadol as needed. Follow up with me in 1 month. Consider physical therapy, ultrasound if not improving as expected.

## 2018-02-15 ENCOUNTER — Encounter: Payer: Self-pay | Admitting: Medical

## 2018-02-15 ENCOUNTER — Ambulatory Visit (INDEPENDENT_AMBULATORY_CARE_PROVIDER_SITE_OTHER): Payer: MEDICARE | Admitting: Medical

## 2018-02-15 VITALS — BP 135/67 | HR 51 | Temp 97.6°F | Resp 16 | Ht 64.0 in | Wt 179.6 lb

## 2018-02-15 DIAGNOSIS — M2550 Pain in unspecified joint: Secondary | ICD-10-CM

## 2018-02-15 LAB — C-REACTIVE PROTEIN: CRP: 0.1 mg/dL — ABNORMAL LOW (ref 0.5–20.0)

## 2018-02-15 LAB — URIC ACID: Uric Acid, Serum: 4.5 mg/dL (ref 4.0–7.8)

## 2018-02-15 LAB — SEDIMENTATION RATE: Sed Rate: 10 mm/hr (ref 0–20)

## 2018-02-15 NOTE — Patient Instructions (Signed)
For your recent diffuse arthralgias over the last 4 days, we will get inflammatory panel studies and I want you to go ahead and start diclofenac.  By Wednesday this week expect to have been given any of studies back and will see how you are doing.  At that point we will make a decision on getting you tapered dose of prednisone.  You mention some intermittent dizziness on exam today.  You have good neurologic exam today.  However would advise caution on ambulating and change positions as you do have a history of intermittent falls.  Follow-up date to be determined after lab review.

## 2018-02-15 NOTE — Progress Notes (Signed)
Subjective:    Patient ID: Brian [name], male    DOB: 1945/11/17, 72 y.o.   MRN: [id number]  HPI  Pt in with some diffuse joint pains all over his body. This pain is present last 4 days. He states in past he never noted correlation with rain and joint pains.  But presently he reports diffuse joint pain.   Pt in past has localized joint pains mostly back pain and shoulder pains. But typically does not report diffuse pain like this.  Pt had some tramadol and he tried to use this but did not help much. He has diclofenac at home but did not use any.  6/10 level pain diffusely.    Review of Systems  Constitutional: Negative for chills, fatigue and fever.  HENT: Negative for congestion and drooling.   Respiratory: Negative for cough, chest tightness, shortness of breath and wheezing.   Cardiovascular: Negative for chest pain and palpitations.  Gastrointestinal: Negative for abdominal pain, anal bleeding and diarrhea.  Musculoskeletal: Positive for arthralgias.  Neurological: Positive for dizziness. Negative for syncope, numbness and headaches.       Mild intermittent dizziness at times. No gross motor or sensory function deficits.  Not dizzy presently.  Hematological: Negative for adenopathy. Does not bruise/bleed easily.  Psychiatric/Behavioral: Negative for behavioral problems and confusion.   Past Medical History:  Diagnosis Date  . Atypical chest pain     Negative cardiac catheterization 2014  . 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 Blind right eye   . 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 Chicken pox   . Chronic back pain   . Dislocation, jaw 1960s  . Edema    Left Leg  . Essential hypertension, benign   . GERD (gastroesophageal reflux disease)   . [place] measles   . Heart attack ([place])   . Hyperlipidemia   . Measles   . Mumps   . Osteopenia 11.12.10   bone density test  . Personal history of other diseases of digestive system 08.10.12   Gastric  ulcer     Social History   Socioeconomic History  . Marital status: Single    Spouse name: Not on file  . Number of children: Not on file  . Years of education: Not on file  . Highest education level: Not on file  Occupational History  . Not on file  Social Needs  . Financial resource strain: Not on file  . Food insecurity:    Worry: Not on file    Inability: Not on file  . Transportation needs:    Medical: Not on file    Non-medical: Not on file  Tobacco Use  . Smoking status: Former Smoker    Packs/day: 0.25    Years: 2.00    Pack years: 0.50    Types: Cigarettes    Last attempt to quit: 06/02/1966    Years since quitting: 51.7  . Smokeless tobacco: Never Used  Substance and Sexual Activity  . Alcohol use: No    Alcohol/week: 0.0 standard drinks  . Drug use: No  . Sexual activity: Never  Lifestyle  . Physical activity:    Days per week: Not on file    Minutes per session: Not on file  . Stress: Not on file  Relationships  . Social connections:    Talks on phone: Not on file    Gets together: Not on file    Attends religious service: Not on file    Active member  of club or organization: Not on file    Attends meetings of clubs or organizations: Not on file    Relationship status: Not on file  . Intimate partner violence:    Fear of current or ex partner: Not on file    Emotionally abused: Not on file    Physically abused: Not on file    Forced sexual activity: Not on file  Other Topics Concern  . Not on file  Social History Narrative  . Not on file    Past Surgical History:  Procedure Laterality Date  . ABDOMINAL HERNIA REPAIR  1972  . APPENDECTOMY  1973  . CATARACT EXTRACTION, BILATERAL  2005  . CHOLECYSTECTOMY  08/2016  . ESOPHAGOGASTRODUODENOSCOPY  06.2011  . EYE SURGERY  1972   Bilateral Lens Replacement  . FACIAL RECONSTRUCTION SURGERY  2008  . LEFT HEART CATH  02.17.2014  . [place], 2008   Dislocated Jaw  . PARS PLANA  VITRECTOMY W/ REPAIR OF MACULAR HOLE  2005   macular hole repair, right eye  . PARS PLANA VITRECTOMY W/ REPAIR OF MACULAR HOLE  2010   mechanical, left eye  . PROSTATE BIOPSY  [place] 2017   w/ Dr. [place] Crane  . WISDOM TOOTH EXTRACTION      Family History  Problem Relation Age of Onset  . Alzheimer's disease Mother        Deceased  . Diabetes Maternal Grandmother   . Heart attack Maternal Grandmother   . Prostate cancer Maternal Uncle   . Diabetes Maternal Uncle   . Healthy Son   . Cataracts Maternal Aunt     Allergies  Allergen Reactions  . Gabapentin Other (See Comments)    Feel "weird"; Nausea; Weakness; Syncope    Current Outpatient Medications on File Prior to Visit  Medication Sig Dispense Refill  . aspirin (ASPIR-81) 81 MG EC tablet Take 1 tablet by mouth daily.    Marland Kitchen b complex vitamins tablet Take 1 tablet by mouth daily.    . benzonatate (TESSALON) 100 MG capsule Take 1 capsule (100 mg total) by mouth 3 (three) times daily as needed. 30 capsule 0  . Calcium Carbonate-Vitamin D (CALCIUM-VITAMIN D3 PO) Take by mouth daily.    . ciprofloxacin (CIPRO) 500 MG tablet Take 1 tablet (500 mg total) by mouth 2 (two) times daily. 20 tablet 0  . cyclobenzaprine (FLEXERIL) 5 MG tablet Take 1 tablet (5 mg total) by mouth at bedtime. 7 tablet 0  . diclofenac (VOLTAREN) 75 MG EC tablet Take 1 tablet (75 mg total) by mouth 2 (two) times daily. 20 tablet 0  . doxycycline (VIBRA-TABS) 100 MG tablet Take 1 tablet (100 mg total) by mouth 2 (two) times daily. Can give caps or generic 20 tablet 0  . hydrochlorothiazide (MICROZIDE) 12.5 MG capsule Take 1 capsule (12.5 mg total) by mouth daily. 90 capsule 3  . levocetirizine (XYZAL) 5 MG tablet Take 1 tablet (5 mg total) by mouth every evening. 30 tablet 0  . methylPREDNISolone (MEDROL DOSEPAK) 4 MG TBPK tablet follow package directions    . mirabegron ER (MYRBETRIQ) 50 MG TB24 tablet Take 50 mg by mouth daily.    . ondansetron (ZOFRAN ODT) 8 MG  disintegrating tablet Take 1 tablet (8 mg total) by mouth every 8 (eight) hours as needed for nausea or vomiting. 20 tablet 0  . pantoprazole (PROTONIX) 40 MG tablet Take 1 tablet (40 mg total) by mouth 2 (two) times daily. 180 tablet 3  .  simvastatin (ZOCOR) 40 MG tablet Take 1 tablet (40 mg total) by mouth every evening. 90 tablet 3  . tamsulosin (FLOMAX) 0.4 MG CAPS capsule Take 2 capsules (0.8 mg total) by mouth daily. 90 capsule 1  . timolol (TIMOPTIC) 0.5 % ophthalmic solution Place 1 drop into the left eye daily. 10 mL 12  . traMADol (ULTRAM) 50 MG tablet 1 tab po q 8 hours prn pain 15 tablet 0  . triamcinolone ointment (KENALOG) 0.5 % Apply 1 application topically 2 (two) times daily. 30 g 0   No current facility-administered medications on file prior to visit.     BP 135/67   Pulse (!) 51   Temp 97.6 F (36.4 C) (Oral)   Resp 16   Ht 5\' 4"  (1.626 m)   Wt 179 lb 9.6 oz (81.5 kg)   SpO2 100%   BMI 30.83 kg/m       Objective:   Physical Exam  General Mental Status- Alert. General Appearance- Not in acute distress.   Skin General: Color- Normal Color. Moisture- Normal Moisture.  Neck Carotid Arteries- Normal color. Moisture- Normal Moisture. No carotid bruits. No JVD.  Chest and Lung Exam Auscultation: Breath Sounds:-Normal.  Cardiovascular Auscultation:Rythm- Regular. Murmurs & Other Heart Sounds:Auscultation of the heart reveals- No Murmurs.  Abdomen Inspection:-Inspeection Normal. Palpation/Percussion:Note:No mass. Palpation and Percussion of the abdomen reveal- Non Tender, Non Distended + BS, no rebound or guarding.   Neurologic Cranial Nerve exam:- CN III-XII intact(No nystagmus), symmetric smile. Strength:- 5/5 equal and symmetric strength both upper and lower extremities.  Knees, elbows and wrist- mild pain on range of motion. But no obvious swelling.   Right shoulder- good abduction and range of motion on exam.   Left shoulder- has partial  abduction of the left arm to the shoulder level.  He notes that injection with sports medicine did not help the left shoulder as much of the did the right side   Neurologic Cranial Nerve exam:- CN III-XII intact(No nystagmus), symmetric smile. Strength:- 5/5 equal and symmetric strength both upper and lower extremities.      Assessment & Plan:  For your recent diffuse arthralgias over the last 4 days, we will get inflammatory panel studies and I want you to go ahead and start diclofenac.  By Wednesday this week expect to have been given any of studies back and will see how you are doing.  At that point we will make a decision on getting you tapered dose of prednisone.  You mention some intermittent dizziness on exam today.  You have good neurologic exam today.  However would advise caution on ambulating and change positions as you do have a history of intermittent falls.  Follow-up date to be determined after lab review.  Mackie Pai, PA-C

## 2018-02-16 ENCOUNTER — Telehealth: Payer: Self-pay | Admitting: Medical

## 2018-02-16 DIAGNOSIS — R269 Unspecified abnormalities of gait and mobility: Secondary | ICD-10-CM

## 2018-02-16 DIAGNOSIS — Z9181 History of falling: Secondary | ICD-10-CM

## 2018-02-16 DIAGNOSIS — R2689 Other abnormalities of gait and mobility: Secondary | ICD-10-CM

## 2018-02-16 NOTE — Telephone Encounter (Signed)
Referral to PT placed

## 2018-02-17 LAB — HLA-B27 ANTIGEN: HLA-B27 ANTIGEN: NEGATIVE

## 2018-02-17 LAB — ANA: Anti Nuclear Antibody(ANA): NEGATIVE

## 2018-02-17 LAB — RHEUMATOID FACTOR: Rhuematoid fact SerPl-aCnc: <14 IU/mL (ref <14)

## 2018-02-19 NOTE — Progress Notes (Addendum)
Subjective:   Brian [name] is a 72 y.o. male who presents for Medicare Annual/Subsequent preventive examination.  Review of Systems: No ROS.  Medicare Wellness Visit. Additional risk factors are reflected in the social history. Cardiac Risk Factors include: male gender;advanced age (>86men, >64 women) Sleep patterns: pt states he hasn't been sleeping very well but no reason. Home Safety/Smoke Alarms: Feels safe in home. Smoke alarms in place. Lives in 2 story home with cousin.   Male:   CCS- pt states his next is due 2021 PSA-  Lab Results  Component Value Date   PSA 0.87 12/09/2017   PSA 1.22 06/09/2017   PSA 0.85 03/27/2017       Objective:    Vitals: BP 120/76 (BP Location: Left Arm, Patient Position: Sitting, Cuff Size: Normal)   Pulse 62   Ht 5\' 4"  (1.626 m)   Wt 181 lb 6.4 oz (82.3 kg)   SpO2 97%   BMI 31.14 kg/m   Body mass index is 31.14 kg/m.  Advanced Directives 02/22/2018 12/08/2017 02/20/2017 10/29/2016 01/08/2016  Does Patient Have a Medical Advance Directive? No No No No No  Would patient like information on creating a medical advance directive? Yes (MAU/Ambulatory/Procedural Areas - Information given) No - Patient declined No - Patient declined No - Patient declined Yes - Educational materials given    Tobacco Social History   Tobacco Use  Smoking Status Former Smoker  . Packs/day: 0.25  . Years: 2.00  . Pack years: 0.50  . Types: Cigarettes  . Last attempt to quit: 06/02/1966  . Years since quitting: 51.7  Smokeless Tobacco Never Used     Counseling given: Not Answered   Clinical Intake:     Pain : 0-10 Pain Score: 5  Pain Type: Chronic pain Pain Location: Knee(knee, hip, shoulder) Pain Onset: More than a month ago Pain Frequency: Constant Pain Relieving Factors: tramadol and diclofenac. Starts PT 03/01/18  Pain Relieving Factors: tramadol and diclofenac. Starts PT 03/01/18              Past Medical History:  Diagnosis Date  .  Atypical chest pain     Negative cardiac catheterization 2014  . 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 Blind right eye   . 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 Chicken pox   . Chronic back pain   . Dislocation, jaw 1960s  . Edema    Left Leg  . Essential hypertension, benign   . GERD (gastroesophageal reflux disease)   . [place] measles   . Heart attack ([place])   . Hyperlipidemia   . Measles   . Mumps   . Osteopenia 11.12.10   bone density test  . Personal history of other diseases of digestive system 08.10.12   Gastric ulcer   Past Surgical History:  Procedure Laterality Date  . ABDOMINAL HERNIA REPAIR  1972  . APPENDECTOMY  1973  . BACK SURGERY  10/28/2017  . CATARACT EXTRACTION, BILATERAL  2005  . CHOLECYSTECTOMY  08/2016  . ESOPHAGOGASTRODUODENOSCOPY  06.2011  . EYE SURGERY  1972   Bilateral Lens Replacement  . FACIAL RECONSTRUCTION SURGERY  2008  . LEFT HEART CATH  02.17.2014  . [place], 2008   Dislocated Jaw  . PARS PLANA VITRECTOMY W/ REPAIR OF MACULAR HOLE  2005   macular hole repair, right eye  . PARS PLANA VITRECTOMY W/ REPAIR OF MACULAR HOLE  2010   mechanical, left eye  . PROSTATE BIOPSY  [place]  2017   w/ Dr. [place] Crane  . WISDOM TOOTH EXTRACTION     Family History  Problem Relation Age of Onset  . Alzheimer's disease Mother        Deceased  . Diabetes Maternal Grandmother   . Heart attack Maternal Grandmother   . Prostate cancer Maternal Uncle   . Diabetes Maternal Uncle   . Healthy Son   . Cataracts Maternal Aunt    Social History   Socioeconomic History  . Marital status: Single    Spouse name: Not on file  . Number of children: Not on file  . Years of education: Not on file  . Highest education level: Not on file  Occupational History  . Not on file  Social Needs  . Financial resource strain: Not on file  . Food insecurity:    Worry: Not on file    Inability: Not on file  . Transportation needs:    Medical:  Not on file    Non-medical: Not on file  Tobacco Use  . Smoking status: Former Smoker    Packs/day: 0.25    Years: 2.00    Pack years: 0.50    Types: Cigarettes    Last attempt to quit: 06/02/1966    Years since quitting: 51.7  . Smokeless tobacco: Never Used  Substance and Sexual Activity  . Alcohol use: No    Alcohol/week: 0.0 standard drinks  . Drug use: No  . Sexual activity: Never  Lifestyle  . Physical activity:    Days per week: Not on file    Minutes per session: Not on file  . Stress: Not on file  Relationships  . Social connections:    Talks on phone: Not on file    Gets together: Not on file    Attends religious service: Not on file    Active member of club or organization: Not on file    Attends meetings of clubs or organizations: Not on file    Relationship status: Not on file  Other Topics Concern  . Not on file  Social History Narrative  . Not on file    Outpatient Encounter Medications as of 02/22/2018  Medication Sig  . aspirin (ASPIR-81) 81 MG EC tablet Take 1 tablet by mouth daily.  Marland Kitchen b complex vitamins tablet Take 1 tablet by mouth daily.  . benzonatate (TESSALON) 100 MG capsule Take 1 capsule (100 mg total) by mouth 3 (three) times daily as needed.  . Calcium Carbonate-Vitamin D (CALCIUM-VITAMIN D3 PO) Take by mouth daily.  . diclofenac (VOLTAREN) 75 MG EC tablet Take 1 tablet (75 mg total) by mouth 2 (two) times daily.  . hydrochlorothiazide (MICROZIDE) 12.5 MG capsule Take 1 capsule (12.5 mg total) by mouth daily.  Marland Kitchen levocetirizine (XYZAL) 5 MG tablet Take 1 tablet (5 mg total) by mouth every evening.  . mirabegron ER (MYRBETRIQ) 50 MG TB24 tablet Take 50 mg by mouth daily.  . pantoprazole (PROTONIX) 40 MG tablet Take 1 tablet (40 mg total) by mouth 2 (two) times daily.  . simvastatin (ZOCOR) 40 MG tablet Take 1 tablet (40 mg total) by mouth every evening.  . tamsulosin (FLOMAX) 0.4 MG CAPS capsule Take 2 capsules (0.8 mg total) by mouth daily.  .  timolol (TIMOPTIC) 0.5 % ophthalmic solution Place 1 drop into the left eye daily.  . traMADol (ULTRAM) 50 MG tablet 1 tab po q 8 hours prn pain  . triamcinolone ointment (KENALOG) 0.5 % Apply 1 application topically 2 (  two) times daily.  . cyclobenzaprine (FLEXERIL) 5 MG tablet Take 1 tablet (5 mg total) by mouth at bedtime. (Patient not taking: Reported on 02/22/2018)  . methylPREDNISolone (MEDROL DOSEPAK) 4 MG TBPK tablet follow package directions  . ondansetron (ZOFRAN ODT) 8 MG disintegrating tablet Take 1 tablet (8 mg total) by mouth every 8 (eight) hours as needed for nausea or vomiting. (Patient not taking: Reported on 02/22/2018)  . [DISCONTINUED] ciprofloxacin (CIPRO) 500 MG tablet Take 1 tablet (500 mg total) by mouth 2 (two) times daily.  . [DISCONTINUED] doxycycline (VIBRA-TABS) 100 MG tablet Take 1 tablet (100 mg total) by mouth 2 (two) times daily. Can give caps or generic   No facility-administered encounter medications on file as of 02/22/2018.     Activities of Daily Living In your present state of health, do you have any difficulty performing the following activities: 02/22/2018  Hearing? Y  Comment hearing aids both ears.  Vision? N  Difficulty concentrating or making decisions? N  Walking or climbing stairs? N  Dressing or bathing? N  Doing errands, shopping? Y  Comment pt uses Special educational needs teacher and eating ? N  Using the Toilet? N  In the past six months, have you accidently leaked urine? N  Do you have problems with loss of bowel control? N  Managing your Medications? N  Managing your Finances? N  Housekeeping or managing your Housekeeping? N  Some recent data might be hidden    Patient Care Team: Saguier, Iris Pert as PCP - General (Internal Medicine) Margaretmary Bayley, MD as Consulting Physician (Gastroenterology) Karie Chimera, PA-C as Physician Assistant (Pulmonary Disease) Radionchenko, Isaias Cowman, MD as Consulting Physician  (Ophthalmology) Bonnee Quin, PA-C as Physician Assistant (Neurosurgery) Pamala Hurry, MD as Consulting Physician (Urology)   Assessment:   This is a routine wellness examination for Jahsiah. Physical assessment deferred to PCP.  Exercise Activities and Dietary recommendations Current Exercise Habits: Home exercise routine, Type of exercise: walking, Time (Minutes): 30, Frequency (Times/Week): 2, Weekly Exercise (Minutes/Week): 60, Intensity: Mild   Diet (meal preparation, eat out, water intake, caffeinated beverages, dairy products, fruits and vegetables): in general, an "unhealthy" diet   Goals    . Increase physical activity     Start walking 3-4 times weekly. Increase as tolerated.    . Weight (lb) < 160 lb (72.6 kg)       Fall Risk Fall Risk  02/22/2018 02/20/2017 02/26/2016 01/08/2016 10/19/2015  Falls in the past year? Yes Yes Yes Yes No  Number falls in past yr: 2 or more 2 or more 1 1 -  Comment - - - Pt was putting clothes away in a dresser and turned around and fell and hit his head. No injury, no LOC. -  Injury with Fall? Yes No No No -  Risk Factor Category  - High Fall Risk - - -  Risk for fall due to : Impaired balance/gait Impaired vision Impaired balance/gait Impaired vision;Impaired balance/gait -  Follow up Education provided;Falls prevention discussed Education provided;Falls prevention discussed Falls prevention discussed - -    Depression Screen PHQ 2/9 Scores 02/22/2018 02/20/2017 02/26/2016 01/08/2016  PHQ - 2 Score 0 2 0 0  PHQ- 9 Score - 3 0 5  Exception Documentation - - Patient refusal -    Cognitive Function MMSE - Mini Mental State Exam 02/20/2017 01/08/2016  Orientation to time 5 5  Orientation to Place 5 5  Registration 3 3  Attention/ Calculation 5 5  Recall 3 3  Language- name 2 objects 2 2  Language- repeat 1 1  Language- follow 3 step command 3 3  Language- read & follow direction 1 1  Write a sentence 1 1  Copy design 1 1  Total score 30 30          Immunization History  Administered Date(s) Administered  . Influenza, High Dose Seasonal PF 02/20/2017  . Influenza,inj,Quad PF,6+ Mos 02/15/2013, 06/16/2014, 06/06/2015, 02/11/2016  . Influenza-Unspecified 03/19/2011, 04/26/2012  . Pneumococcal Conjugate-13 01/08/2016  . Pneumococcal Polysaccharide-23 11/21/2010, 06/06/2015  . Tdap 11/21/2010  . Zoster 02/15/2013    Screening Tests Health Maintenance  Topic Date Due  . Hepatitis C Screening  04/15/1946  . COLONOSCOPY  06/05/2017  . INFLUENZA VACCINE  12/31/2017  . TETANUS/TDAP  11/20/2020  . PNA vac Low Risk Adult  Completed       Plan:    Please schedule your next medicare wellness visit with me in 1 yr.  Eat heart healthy diet (full of fruits, vegetables, whole grains, lean protein, water--limit salt, fat, and sugar intake) and increase physical activity as tolerated.  Continue doing brain stimulating activities (puzzles, reading, adult coloring books, staying active) to keep memory sharp.   Pt starts PT 03/01/18  I have personally reviewed and noted the following in the patient's chart:   . Medical and social history . Use of alcohol, tobacco or illicit drugs  . Current medications and supplements . Functional ability and status . Nutritional status . Physical activity . Advanced directives . List of other physicians . Hospitalizations, surgeries, and ER visits in previous 12 months . Vitals . Screenings to include cognitive, depression, and falls . Referrals and appointments  In addition, I have reviewed and discussed with patient certain preventive protocols, quality metrics, and best practice recommendations. A written personalized care plan for preventive services as well as general preventive health recommendations were provided to patient.     Shela Nevin, [place]  02/22/2018  Reviewed and agree with assessment and plan of RN  Mackie Pai, PA-C

## 2018-02-22 ENCOUNTER — Encounter: Payer: Self-pay | Admitting: *Deleted

## 2018-02-22 ENCOUNTER — Ambulatory Visit (INDEPENDENT_AMBULATORY_CARE_PROVIDER_SITE_OTHER): Payer: MEDICARE | Admitting: *Deleted

## 2018-02-22 VITALS — BP 120/76 | HR 62 | Ht 64.0 in | Wt 181.4 lb

## 2018-02-22 DIAGNOSIS — Z0000 Encounter for general adult medical examination without abnormal findings: Secondary | ICD-10-CM | POA: Diagnosis not present

## 2018-02-22 DIAGNOSIS — Z23 Encounter for immunization: Secondary | ICD-10-CM | POA: Diagnosis not present

## 2018-02-22 NOTE — Patient Instructions (Signed)
Please schedule your next medicare wellness visit with me in 1 yr.  Eat heart healthy diet (full of fruits, vegetables, whole grains, lean protein, water--limit salt, fat, and sugar intake) and increase physical activity as tolerated.  Continue doing brain stimulating activities (puzzles, reading, adult coloring books, staying active) to keep memory sharp.    Brian [name] , Thank you for taking time to come for your Medicare Wellness Visit. I appreciate your ongoing commitment to your health goals. Please review the following plan we discussed and let me know if I can assist you in the future.   These are the goals we discussed: Goals    . Increase physical activity     Start walking 3-4 times weekly. Increase as tolerated.    . Weight (lb) < 160 lb (72.6 kg)       This is a list of the screening recommended for you and due dates:  Health Maintenance  Topic Date Due  .  Hepatitis C: One time screening is recommended by Center for Disease Control  (CDC) for  adults born from [birth year] through 1967.   05-17-1946  . Colon Cancer Screening  06/05/2017  . Flu Shot  12/31/2017  . Tetanus Vaccine  11/20/2020  . Pneumonia vaccines  Completed     Health Maintenance, Male A healthy lifestyle and preventive care is important for your health and wellness. Ask your health care provider about what schedule of regular examinations is right for you. What should I know about weight and diet? Eat a Healthy Diet  Eat plenty of vegetables, fruits, whole grains, low-fat dairy products, and lean protein.  Do not eat a lot of foods high in solid fats, added sugars, or salt.  Maintain a Healthy Weight Regular exercise can help you achieve or maintain a healthy weight. You should:  Do at least 150 minutes of exercise each week. The exercise should increase your heart rate and make you sweat (moderate-intensity exercise).  Do strength-training exercises at least twice a week.  Watch Your Levels of  Cholesterol and Blood Lipids  Have your blood tested for lipids and cholesterol every 5 years starting at 74 years of age. If you are at high risk for heart disease, you should start having your blood tested when you are 74 years old. You may need to have your cholesterol levels checked more often if: ? Your lipid or cholesterol levels are high. ? You are older than 74 years of age. ? You are at high risk for heart disease.  What should I know about cancer screening? Many types of cancers can be detected early and may often be prevented. Lung Cancer  You should be screened every year for lung cancer if: ? You are a current smoker who has smoked for at least 30 years. ? You are a former smoker who has quit within the past 15 years.  Talk to your health care provider about your screening options, when you should start screening, and how often you should be screened.  Colorectal Cancer  Routine colorectal cancer screening usually begins at 74 years of age and should be repeated every 5-10 years until you are 74 years old. You may need to be screened more often if early forms of precancerous polyps or small growths are found. Your health care provider may recommend screening at an earlier age if you have risk factors for colon cancer.  Your health care provider may recommend using home test kits to check for hidden blood  in the stool.  A small camera at the end of a tube can be used to examine your colon (sigmoidoscopy or colonoscopy). This checks for the earliest forms of colorectal cancer.  Prostate and Testicular Cancer  Depending on your age and overall health, your health care provider may do certain tests to screen for prostate and testicular cancer.  Talk to your health care provider about any symptoms or concerns you have about testicular or prostate cancer.  Skin Cancer  Check your skin from head to toe regularly.  Tell your health care provider about any new moles or changes  in moles, especially if: ? There is a change in a mole's size, shape, or color. ? You have a mole that is larger than a pencil eraser.  Always use sunscreen. Apply sunscreen liberally and repeat throughout the day.  Protect yourself by wearing long sleeves, pants, a wide-brimmed hat, and sunglasses when outside.  What should I know about heart disease, diabetes, and high blood pressure?  If you are 64-71 years of age, have your blood pressure checked every 3-5 years. If you are 2 years of age or older, have your blood pressure checked every year. You should have your blood pressure measured twice-once when you are at a hospital or clinic, and once when you are not at a hospital or clinic. Record the average of the two measurements. To check your blood pressure when you are not at a hospital or clinic, you can use: ? An automated blood pressure machine at a pharmacy. ? A home blood pressure monitor.  Talk to your health care provider about your target blood pressure.  If you are between 41-53 years old, ask your health care provider if you should take aspirin to prevent heart disease.  Have regular diabetes screenings by checking your fasting blood sugar level. ? If you are at a normal weight and have a low risk for diabetes, have this test once every three years after the age of 25. ? If you are overweight and have a high risk for diabetes, consider being tested at a younger age or more often.  A one-time screening for abdominal aortic aneurysm (AAA) by ultrasound is recommended for men aged 16-75 years who are current or former smokers. What should I know about preventing infection? Hepatitis B If you have a higher risk for hepatitis B, you should be screened for this virus. Talk with your health care provider to find out if you are at risk for hepatitis B infection. Hepatitis C Blood testing is recommended for:  Everyone born from [birth year] through 1967.  Anyone with known risk factors  for hepatitis C.  Sexually Transmitted Diseases (STDs)  You should be screened each year for STDs including gonorrhea and chlamydia if: ? You are sexually active and are younger than 74 years of age. ? You are older than 74 years of age and your health care provider tells you that you are at risk for this type of infection. ? Your sexual activity has changed since you were last screened and you are at an increased risk for chlamydia or gonorrhea. Ask your health care provider if you are at risk.  Talk with your health care provider about whether you are at high risk of being infected with HIV. Your health care provider may recommend a prescription medicine to help prevent HIV infection.  What else can I do?  Schedule regular health, dental, and eye exams.  Stay current with your vaccines (immunizations).  Do not use any tobacco products, such as cigarettes, chewing tobacco, and e-cigarettes. If you need help quitting, ask your health care provider.  Limit alcohol intake to no more than 2 drinks per day. One drink equals 12 ounces of beer, 5 ounces of wine, or 1 ounces of hard liquor.  Do not use street drugs.  Do not share needles.  Ask your health care provider for help if you need support or information about quitting drugs.  Tell your health care provider if you often feel depressed.  Tell your health care provider if you have ever been abused or do not feel safe at home. This information is not intended to replace advice given to you by your health care provider. Make sure you discuss any questions you have with your health care provider. Document Released: 11/15/2007 Document Revised: 01/16/2016 Document Reviewed: 02/20/2015 Elsevier Interactive Patient Education  [employer].

## 2018-03-01 ENCOUNTER — Ambulatory Visit: Payer: MEDICARE | Attending: Medical | Admitting: Physical Therapy

## 2018-03-01 ENCOUNTER — Other Ambulatory Visit: Payer: Self-pay

## 2018-03-01 DIAGNOSIS — R296 Repeated falls: Secondary | ICD-10-CM | POA: Diagnosis present

## 2018-03-01 DIAGNOSIS — M6281 Muscle weakness (generalized): Secondary | ICD-10-CM

## 2018-03-01 DIAGNOSIS — R2681 Unsteadiness on feet: Secondary | ICD-10-CM | POA: Insufficient documentation

## 2018-03-01 NOTE — Therapy (Signed)
[facility] [address]  [place], [place], [address] Phone: [phone]   Fax:  [phone]  Physical Therapy Evaluation  Patient Details  Name: Brian [name] MRN: [id number] Date of Birth: 06/05/1945 Referring Provider (PT): Mackie Pai, [place]   Encounter Date: 03/01/2018  PT End of Session - 03/01/18 0848    Visit Number  1    Number of Visits  8    Date for PT Re-Evaluation  04/02/18    Authorization Type  Medicare [employer] & [employer]    PT Start Time  0848    PT Stop Time  0931    PT Time Calculation (min)  43 min    Activity Tolerance  Patient tolerated treatment well    Behavior During Therapy  [facility] for tasks assessed/performed       Past Medical History:  Diagnosis Date  . Atypical chest pain     Negative cardiac catheterization 2014  . 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 Blind right eye   . 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 Chicken pox   . Chronic back pain   . Dislocation, jaw 1960s  . Edema    Left Leg  . Essential hypertension, benign   . GERD (gastroesophageal reflux disease)   . [place] measles   . Heart attack ([place])   . Hyperlipidemia   . Measles   . Mumps   . Osteopenia 11.12.10   bone density test  . Personal history of other diseases of digestive system 08.10.12   Gastric ulcer    Past Surgical History:  Procedure Laterality Date  . ABDOMINAL HERNIA REPAIR  1972  . APPENDECTOMY  1973  . BACK SURGERY  10/28/2017  . CATARACT EXTRACTION, BILATERAL  2005  . CHOLECYSTECTOMY  08/2016  . ESOPHAGOGASTRODUODENOSCOPY  06.2011  . EYE SURGERY  1972   Bilateral Lens Replacement  . FACIAL RECONSTRUCTION SURGERY  2008  . LEFT HEART CATH  02.17.2014  . [place], 2008   Dislocated Jaw  . PARS PLANA VITRECTOMY W/ REPAIR OF MACULAR HOLE  2005   macular hole repair, right eye  . PARS PLANA VITRECTOMY W/ REPAIR OF MACULAR HOLE  2010   mechanical, left  eye  . PROSTATE BIOPSY  [place] 2017   w/ Dr. [place] Crane  . WISDOM TOOTH EXTRACTION      There were no vitals filed for this visit.   Subjective Assessment - 03/01/18 0851    Subjective  Pt reporting 3 falls in 3 months, mostly commonly in backwark direction typically while turning or rising from squat. Denies dizziness or lightheadedness. Notes knees will sometimes give way, R > L, but this does not contribute to his falls.     Pertinent History  Lumbar fusion - 10/28/17    Limitations  Standing;Lifting;House hold activities    How long can you stand comfortably?  5 min    Patient Stated Goals  "to get a new body"    Currently in Pain?  Yes    Pain Score  6     Pain Location  Hip    Pain Orientation  Left;Right   L > R   Pain Descriptors / Indicators  Stabbing;Burning    Pain Type  Chronic pain    Pain Radiating Towards  down front of legs to knee on L, full leg on R    Pain Onset  More than a month ago  Pain Frequency  Constant    Aggravating Factors   prolonged standing    Pain Relieving Factors  nothing    Effect of Pain on Daily Activities  works through pain    Multiple Pain Sites  Yes    Pain Score  8    Pain Location  Shoulder    Pain Orientation  Left;Right;Lateral   L > R   Pain Descriptors / Indicators  Stabbing;Jabbing    Pain Type  Chronic pain    Pain Radiating Towards  down lateral arm to elbow    Pain Onset  More than a month ago    Pain Frequency  Constant    Aggravating Factors   lifting, reaching esp overhead    Pain Relieving Factors  nothing    Effect of Pain on Daily Activities  limited ability to raise arms overhead (L>R), notes "cracking" with horiz abduction         [facility] PT Assessment - 03/01/18 0848      Assessment   Medical Diagnosis  Imbalance & h/o falls    Referring Provider (PT)  Mackie Pai, PA-C    Onset Date/Surgical Date  --   ~3 months   Next MD Visit  03/04/18    Prior Therapy  PT for low back following lumbar fusion on 10/28/17       Precautions   Precautions  Fall      Balance Screen   Has the patient fallen in the past 6 months  Yes    How many times?  3    Has the patient had a decrease in activity level because of a fear of falling?   Yes    Is the patient reluctant to leave their home because of a fear of falling?   No      Home Environment   Living Environment  Private residence    Type of [place] Access  Stairs to enter    Entrance Stairs-Number of Steps  1    Home Layout  Two level;Bed/bath upstairs    Alternate Level Stairs-Number of Steps  15    Alternate Level Stairs-Rails  Right;Left;Can reach both    Home Equipment  Grab bars - tub/shower;Bedside commode   grab bar on bed     Prior Function   Level of Independence  Independent    Vocation  Retired    Leisure  listen to books, cook/bake       Cognition   Overall Cognitive Status  Within Functional Limits for tasks assessed      Sensation   Light Touch  --   numbness in great toes with burning in soles of feet     ROM / Strength   AROM / PROM / Strength  Strength      Strength   Overall Strength Comments  tested in sitting    Strength Assessment Site  Hip;Knee;Ankle    Right Hip Flexion  4/5    Right Hip Extension  3-/5    Right Hip ABduction  4-/5    Right Hip ADduction  4-/5    Left Hip Flexion  4/5    Left Hip Extension  3-/5    Left Hip ABduction  4-/5    Left Hip ADduction  4-/5    Right Knee Flexion  4/5    Right Knee Extension  4+/5    Left Knee Flexion  4/5    Left Knee Extension  4+/5    Right/Left Ankle  Right;Left    Right Ankle Dorsiflexion  4/5    Right Ankle Plantar Flexion  4/5   increased pain in sole of foot   Left Ankle Dorsiflexion  4/5    Left Ankle Plantar Flexion  4/5   increased pain in sole of foot     Ambulation/Gait   Gait velocity  3.21 ft/sec      Standardized Balance Assessment   Standardized Balance Assessment  Berg Balance Test;Timed Up and Go Test;Five Times Sit to Stand;10  meter walk test    Five times sit to stand comments   18.0 sec    10 Meter Walk  10.21 sec      Berg Balance Test   Sit to Stand  Able to stand without using hands and stabilize independently    Standing Unsupported  Able to stand safely 2 minutes    Sitting with Back Unsupported but Feet Supported on Floor or Stool  Able to sit safely and securely 2 minutes    Stand to Sit  Sits safely with minimal use of hands    Transfers  Able to transfer safely, minor use of hands    Standing Unsupported with Eyes Closed  Able to stand 10 seconds safely    Standing Ubsupported with Feet Together  Able to place feet together independently and stand 1 minute safely    From Standing, Reach Forward with Outstretched Arm  Can reach forward >12 cm safely (5")    From Standing Position, Pick up Object from Floor  Able to pick up shoe safely and easily    From Standing Position, Turn to Look Behind Over each Shoulder  Looks behind one side only/other side shows less weight shift    Turn 360 Degrees  Able to turn 360 degrees safely one side only in 4 seconds or less    Standing Unsupported, Alternately Place Feet on Step/Stool  Able to stand independently and safely and complete 8 steps in 20 seconds    Standing Unsupported, One Foot in Front  Able to plae foot ahead of the other independently and hold 30 seconds    Standing on One Leg  Tries to lift leg/unable to hold 3 seconds but remains standing independently    Total Score  49    Berg comment:  46-51 moderate fall risk (>50%)       Timed Up and Go Test   Normal TUG (seconds)  9.66                Objective measurements completed on examination: See above findings.                   PT Long Term Goals - 03/01/18 1818      PT LONG TERM GOAL #1   Title  Pt will be independent with advanced/ongoing HEP    Status  New    Target Date  04/02/18      PT LONG TERM GOAL #2   Title  Pt to improve gross bilat LE strength to 4/5 to  improve stability in standing    Status  New    Target Date  04/02/18      PT LONG TERM GOAL #3   Title  Pt will improve Berg to >/= 52/56 to decrease risk for falls    Status  New    Target Date  04/02/18      PT LONG TERM GOAL #4  Title  Pt will demonstrate ability to walk with head turns and maintain gait within a 12 inch pathway to improve ability to safely amb within community and decrease fall risk.     Status  New    Target Date  04/02/18      PT LONG TERM GOAL #5   Title  Pt will report no futher instances of falls    Status  New    Target Date  04/02/18             Plan - 03/01/18 0931    Clinical Impression Statement  Leander is a 72 y/o male who presents to OP PT for worsening gait disturbance and imbalance with 3 falls in the last 3 months. Pt reports falls typically occur posteriorly and happen while turning or rising from a squatting position. Pt has had recent surgery for a lumbar fusion on 10/28/17 and was receiving PT until early August when he stopped due to worsening pain and wanting to f/u with MD. Pt continues to report B hip & B shoulder pain. Assessment revealed mild to moderate B LE weakness. Gait speed was mildly limited at 3.21 ft/sec placing pt at a community ambulatory level. Balance testing via the 5xSTS (18.0 sec) and Berg Balance Scale (49/56) indicated a moderate (>50%) fall risk; with the TUG (9.66 sec) falling within the normal range.  Pt will benefit from skilled PT to address deficits listed to increase balance and improve gait stability to increase safety with mobility and decrease risk for falls.    History and Personal Factors relevant to plan of care:  Recent lumbar fusion (10/28/17) with onoing B hip pain; B shoulder pain; hearing loss; blind in R eye; + complex medical history    Clinical Presentation  Evolving    Clinical Presentation due to:  worsening balance with 3 falls in past 3 months; recent lumbar fusion (10/28/17) with onoing B hip pain;  B shoulder pain; hearing loss; blind in R eye; + complex medical history    Clinical Decision Making  Moderate    Rehab Potential  Good    Clinical Impairments Affecting Rehab Potential  Hearing difficulty, R eye visual deficits     PT Frequency  2x / week    PT Duration  4 weeks    PT Treatment/Interventions  Patient/family education;Neuromuscular re-education;Balance training;Therapeutic exercise;Therapeutic activities;Functional mobility training;Gait training;ADLs/Self Care Home Management;Manual techniques;Dry needling;Taping;Electrical Stimulation;Moist Heat;Cryotherapy;Iontophoresis 4mg /ml Dexamethasone    Consulted and Agree with Plan of Care  Patient       Patient will benefit from skilled therapeutic intervention in order to improve the following deficits and impairments:  Decreased balance, Decreased activity tolerance, Decreased safety awareness, Difficulty walking, Decreased mobility, Postural dysfunction, Improper body mechanics, Decreased strength, Pain  Visit Diagnosis: Repeated falls  Unsteadiness on feet  Muscle weakness (generalized)     Problem List Patient Active Problem List   Diagnosis Date Noted  . Right shoulder pain 01/30/2017  . Left shoulder pain 05/19/2016  . Urinary hesitancy 10/18/2014  . Trapezius muscle spasm 10/18/2014  . Idiopathic neuropathy 10/18/2014  . Allergic reaction 09/28/2014  . Hyperglycemia 09/28/2014  . Acute bacterial bronchitis 08/18/2014  . Hematuria 08/18/2014  . Screening, ischemic heart disease 06/16/2014  . Lumbar radiculopathy, chronic 05/19/2014  . Midline thoracic back pain 03/24/2014  . BPPV (benign paroxysmal positional vertigo) 01/20/2014  . Right-sided low back pain with right-sided sciatica 12/21/2013  . Lightheadedness 09/22/2013  . Porokeratosis 02/25/2013  . Pain in joint, ankle  and foot 02/25/2013  . Lumbosacral pain 02/15/2013  . Abdominal pain, epigastric 02/15/2013  . Acid reflux 02/15/2013  . Hearing  loss 02/15/2013  . Preventive measure 02/15/2013  . Chest pain 07/19/2012  . Bradycardia, sinus 07/19/2012  . Borderline glaucoma 03/15/2012  . 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 Dislocated inferior maxilla 01/10/2011  . H/O gastric ulcer 01/10/2011  . Back pain, chronic 11/21/2010    Percival Spanish, PT, MPT 03/01/2018, 6:24 PM  [facility] [address]  [place], [place], [address] Phone: [phone]   Fax:  [phone]  Name: Brian [name] MRN: [id number] Date of Birth: 27-Oct-1945

## 2018-03-04 ENCOUNTER — Ambulatory Visit (INDEPENDENT_AMBULATORY_CARE_PROVIDER_SITE_OTHER): Payer: MEDICARE | Admitting: Medical

## 2018-03-04 ENCOUNTER — Encounter: Payer: Self-pay | Admitting: Medical

## 2018-03-04 ENCOUNTER — Encounter: Payer: Self-pay | Admitting: Family Medicine

## 2018-03-04 ENCOUNTER — Ambulatory Visit (INDEPENDENT_AMBULATORY_CARE_PROVIDER_SITE_OTHER): Payer: MEDICARE | Admitting: Family Medicine

## 2018-03-04 VITALS — BP 133/67 | HR 53 | Temp 97.9°F | Resp 16 | Ht 64.0 in | Wt 182.6 lb

## 2018-03-04 VITALS — BP 126/80 | HR 57 | Ht 66.0 in | Wt 184.0 lb

## 2018-03-04 DIAGNOSIS — R1011 Right upper quadrant pain: Secondary | ICD-10-CM | POA: Diagnosis not present

## 2018-03-04 DIAGNOSIS — M25551 Pain in right hip: Secondary | ICD-10-CM

## 2018-03-04 DIAGNOSIS — M25511 Pain in right shoulder: Secondary | ICD-10-CM | POA: Diagnosis present

## 2018-03-04 DIAGNOSIS — M25552 Pain in left hip: Secondary | ICD-10-CM | POA: Diagnosis not present

## 2018-03-04 DIAGNOSIS — M25512 Pain in left shoulder: Secondary | ICD-10-CM | POA: Diagnosis not present

## 2018-03-04 LAB — COMPREHENSIVE METABOLIC PANEL
ALT: 18 U/L (ref 0–53)
AST: 18 U/L (ref 0–37)
Albumin: 4.1 g/dL (ref 3.5–5.2)
Alkaline Phosphatase: 71 U/L (ref 39–117)
BILIRUBIN TOTAL: 1.6 mg/dL — AB (ref 0.2–1.2)
BUN: 11 mg/dL (ref 6–23)
CALCIUM: 9.9 mg/dL (ref 8.4–10.5)
CHLORIDE: 101 meq/L (ref 96–112)
CO2: 34 meq/L — AB (ref 19–32)
CREATININE: 0.98 mg/dL (ref 0.40–1.50)
GFR: 96.08 mL/min (ref >60.00)
Glucose, Bld: 99 mg/dL (ref 70–99)
Potassium: 4.3 mEq/L (ref 3.5–5.1)
Sodium: 139 mEq/L (ref 135–145)
Total Protein: 7 g/dL (ref 6.0–8.3)

## 2018-03-04 MED ORDER — METHYLPREDNISOLONE ACETATE 40 MG/ML IJ SUSP
40.0000 mg | Freq: Once | INTRAMUSCULAR | Status: AC
  Administered 2018-03-04: 40 mg via INTRAMUSCULAR

## 2018-03-04 NOTE — Patient Instructions (Signed)
You have partial tears of your supraspinatus (rotator cuff) on both sides with bursitis and impingement. Start physical therapy and do home exercises on days you don't go to therapy. Follow up with me in 6 weeks. If not improving would consider orthopedic surgery referral.

## 2018-03-04 NOTE — Progress Notes (Signed)
Subjective:    Patient ID: Brian [name], male    DOB: Jul 04, 1945, 72 y.o.   MRN: [id number]  HPI   Pt in states he having some hip pain bilaterally but not back pain. He actually points to bilaterally SI area. Some pain from hip that radiates into the groin area on left side.   Pt has had back surgery before.   Pt states orthopedist 2 months ago he went to ortho who gave him 6 days taper dose of prednisone. Then told him if he did not get better would treat with injection in left groin area? Pt wants to know if he should proceed. Dx was transient synovitis of the hip.  Pain on left side 5/10 constant with occasional transient increase to 8-9/10.   Review of Systems  Constitutional: Negative for chills, fatigue and fever.  Respiratory: Negative for cough, chest tightness, shortness of breath and wheezing.   Cardiovascular: Negative for chest pain and palpitations.  Gastrointestinal: Positive for abdominal pain.       Pain under rt rib area when he eats greasy and spicy foods. [place] last year was negative. 2 days ago last time had pain.  GB surgery one year ago.  Musculoskeletal: Negative for back pain, myalgias, neck pain and neck stiffness.       Bilateral hip pain.  Skin: Negative for rash.  Neurological: Negative for seizures, facial asymmetry, speech difficulty, weakness and light-headedness.  Hematological: Negative for adenopathy. Does not bruise/bleed easily.  Psychiatric/Behavioral: Negative for behavioral problems, confusion and decreased concentration.   Past Medical History:  Diagnosis Date  . Atypical chest pain     Negative cardiac catheterization 2014  . 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 Blind right eye   . 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 Chicken pox   . Chronic back pain   . Dislocation, jaw 1960s  . Edema    Left Leg  . Essential hypertension, benign   . GERD (gastroesophageal reflux disease)   . [place] measles   . Heart attack  ([place])   . Hyperlipidemia   . Measles   . Mumps   . Osteopenia 11.12.10   bone density test  . Personal history of other diseases of digestive system 08.10.12   Gastric ulcer     Social History   Socioeconomic History  . Marital status: Single    Spouse name: Not on file  . Number of children: Not on file  . Years of education: Not on file  . Highest education level: Not on file  Occupational History  . Not on file  Social Needs  . Financial resource strain: Not on file  . Food insecurity:    Worry: Not on file    Inability: Not on file  . Transportation needs:    Medical: Not on file    Non-medical: Not on file  Tobacco Use  . Smoking status: Former Smoker    Packs/day: 0.25    Years: 2.00    Pack years: 0.50    Types: Cigarettes    Last attempt to quit: 06/02/1966    Years since quitting: 51.7  . Smokeless tobacco: Never Used  Substance and Sexual Activity  . Alcohol use: No    Alcohol/week: 0.0 standard drinks  . Drug use: No  . Sexual activity: Never  Lifestyle  . Physical activity:    Days per week: Not on file    Minutes per session: Not on file  . Stress: Not  on file  Relationships  . Social connections:    Talks on phone: Not on file    Gets together: Not on file    Attends religious service: Not on file    Active member of club or organization: Not on file    Attends meetings of clubs or organizations: Not on file    Relationship status: Not on file  . Intimate partner violence:    Fear of current or ex partner: Not on file    Emotionally abused: Not on file    Physically abused: Not on file    Forced sexual activity: Not on file  Other Topics Concern  . Not on file  Social History Narrative  . Not on file    Past Surgical History:  Procedure Laterality Date  . ABDOMINAL HERNIA REPAIR  1972  . APPENDECTOMY  1973  . BACK SURGERY  10/28/2017  . CATARACT EXTRACTION, BILATERAL  2005  . CHOLECYSTECTOMY  08/2016  . ESOPHAGOGASTRODUODENOSCOPY   06.2011  . EYE SURGERY  1972   Bilateral Lens Replacement  . FACIAL RECONSTRUCTION SURGERY  2008  . LEFT HEART CATH  02.17.2014  . [place], 2008   Dislocated Jaw  . PARS PLANA VITRECTOMY W/ REPAIR OF MACULAR HOLE  2005   macular hole repair, right eye  . PARS PLANA VITRECTOMY W/ REPAIR OF MACULAR HOLE  2010   mechanical, left eye  . PROSTATE BIOPSY  [place] 2017   w/ Dr. [place] Crane  . WISDOM TOOTH EXTRACTION      Family History  Problem Relation Age of Onset  . Alzheimer's disease Mother        Deceased  . Diabetes Maternal Grandmother   . Heart attack Maternal Grandmother   . Prostate cancer Maternal Uncle   . Diabetes Maternal Uncle   . Healthy Son   . Cataracts Maternal Aunt     Allergies  Allergen Reactions  . Gabapentin Other (See Comments)    Feel "weird"; Nausea; Weakness; Syncope    Current Outpatient Medications on File Prior to Visit  Medication Sig Dispense Refill  . aspirin (ASPIR-81) 81 MG EC tablet Take 1 tablet by mouth daily.    Marland Kitchen b complex vitamins tablet Take 1 tablet by mouth daily.    . benzonatate (TESSALON) 100 MG capsule Take 1 capsule (100 mg total) by mouth 3 (three) times daily as needed. 30 capsule 0  . Calcium Carbonate-Vitamin D (CALCIUM-VITAMIN D3 PO) Take by mouth daily.    . cyclobenzaprine (FLEXERIL) 5 MG tablet Take 1 tablet (5 mg total) by mouth at bedtime. 7 tablet 0  . diclofenac (VOLTAREN) 75 MG EC tablet Take 1 tablet (75 mg total) by mouth 2 (two) times daily. 20 tablet 0  . hydrochlorothiazide (MICROZIDE) 12.5 MG capsule Take 1 capsule (12.5 mg total) by mouth daily. 90 capsule 3  . levocetirizine (XYZAL) 5 MG tablet Take 1 tablet (5 mg total) by mouth every evening. 30 tablet 0  . methylPREDNISolone (MEDROL DOSEPAK) 4 MG TBPK tablet follow package directions    . mirabegron ER (MYRBETRIQ) 50 MG TB24 tablet Take 50 mg by mouth daily.    . ondansetron (ZOFRAN ODT) 8 MG disintegrating tablet Take 1 tablet (8 mg total) by  mouth every 8 (eight) hours as needed for nausea or vomiting. 20 tablet 0  . pantoprazole (PROTONIX) 40 MG tablet Take 1 tablet (40 mg total) by mouth 2 (two) times daily. 180 tablet 3  . simvastatin (ZOCOR) 40  MG tablet Take 1 tablet (40 mg total) by mouth every evening. 90 tablet 3  . tamsulosin (FLOMAX) 0.4 MG CAPS capsule Take 2 capsules (0.8 mg total) by mouth daily. 90 capsule 1  . timolol (TIMOPTIC) 0.5 % ophthalmic solution Place 1 drop into the left eye daily. 10 mL 12  . traMADol (ULTRAM) 50 MG tablet 1 tab po q 8 hours prn pain 15 tablet 0  . triamcinolone ointment (KENALOG) 0.5 % Apply 1 application topically 2 (two) times daily. 30 g 0   No current facility-administered medications on file prior to visit.     BP 133/67   Pulse (!) 53   Temp 97.9 F (36.6 C) (Oral)   Resp 16   Ht 5\' 4"  (1.626 m)   Wt 182 lb 9.6 oz (82.8 kg)   SpO2 99%   BMI 31.34 kg/m       Objective:   Physical Exam   General Mental Status- Alert. General Appearance- Not in acute distress.   Skin General: Color- Normal Color. Moisture- Normal Moisture.  Neck Carotid Arteries- Normal color. Moisture- Normal Moisture. No carotid bruits. No JVD.  Chest and Lung Exam Auscultation: Breath Sounds:-Normal.  Cardiovascular Auscultation:Rythm- Regular. Murmurs & Other Heart Sounds:Auscultation of the heart reveals- No Murmurs.  Abdomen Inspection:-Inspeection Normal. Palpation/Percussion:Note:No mass. Palpation and Percussion of the abdomen reveal- non Tender, Non Distended + BS, no rebound or guarding.    Neurologic Cranial Nerve exam:- CN III-XII intact(No nystagmus), symmetric smile. Strength:- 5/5 equal and symmetric strength both upper and lower extremities.  Bilateral hip- pain on range of motion. Worse on left side.     Assessment & Plan:  For bilateral hip region pains, we gave you depomedrol 40 mg im. You in the past expressed reluctance to take medications for pain.  Hopefully  injection today   will help.  As we discussed you could try some CBD oil to hip areas.  Keep in mind this is not FDA indicated/authorized, but this might be helpful as you expressed reluctance to get the injection directly in your hip regions.  For your intermittent right upper quadrant pain after eating greasy foods, I do want to get CMP/liver enzymes studies today.  If you have abnormalities then might consider referral to GI MD to evaluate for possible retained common bile duct stone.  Prior ultrasounds were negative after her gallbladder surgery.  But retained bile duct stone can cause some symptoms if present.  Follow-up in 7 to 10 days or as needed.  Mackie Pai, PA-C

## 2018-03-04 NOTE — Patient Instructions (Signed)
For bilateral hip region pains, we gave you depomedrol 40 mg im. You in the past expressed reluctance to take medications for pain.  Hopefully injection today   will help.  As we discussed you could try some CBD oil to hip areas.  Keep in mind this is not FDA indicated/authorized, but this might be helpful as you expressed reluctance to get the injection directly in your hip regions.  For your intermittent right upper quadrant pain after eating greasy foods, I do want to get CMP/liver enzymes studies today.  If you have abnormalities then might consider referral to GI MD to evaluate for possible retained common bile duct stone.  Prior ultrasounds were negative after her gallbladder surgery.  But retained bile duct stone can cause some symptoms if present.  Follow-up in 7 to 10 days or as needed.

## 2018-03-07 ENCOUNTER — Encounter: Payer: Self-pay | Admitting: Family Medicine

## 2018-03-07 NOTE — Progress Notes (Signed)
PCP: Mackie Pai, PA-C  Subjective:   HPI: Patient is a 72 y.o. male here for bilateral shoulder pain.  9/3: Patient has history of chronic right shoulder pain. Did not notice much benefit in past with subacromial injection, oral prednisone dose pack. States about 2 weeks ago he fell backwards directly onto back, posterior shoulders. Pain level now 6/10 left shoulder, 5/10 right and sharp. Worse with movement, trying to reach overhead. Some radiation into left arm with numbness on radial side of forearm. Tried tramadol but no other medications. Pain in both shoulder is primarily superior and lateral. States he is not sleeping but not due to his shoulders. No skin changes. Some numbness only in left wrist area.  10/3: Patient returns with continued pain in shoulders. Pain at 5/10 in left, 3/10 on right laterally, both sharp. Difficulty reaching overhead and cannot sleep due to pain. Too much pain doing home exercises, hard to tolerate these. No new injuries. No skin changes, numbness.  Past Medical History:  Diagnosis Date  . Atypical chest pain     Negative cardiac catheterization 2014  . 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 Blind right eye   . 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 Chicken pox   . Chronic back pain   . Dislocation, jaw 1960s  . Edema    Left Leg  . Essential hypertension, benign   . GERD (gastroesophageal reflux disease)   . [place] measles   . Heart attack ([place])   . Hyperlipidemia   . Measles   . Mumps   . Osteopenia 11.12.10   bone density test  . Personal history of other diseases of digestive system 08.10.12   Gastric ulcer    Current Outpatient Medications on File Prior to Visit  Medication Sig Dispense Refill  . aspirin (ASPIR-81) 81 MG EC tablet Take 1 tablet by mouth daily.    Marland Kitchen b complex vitamins tablet Take 1 tablet by mouth daily.    . benzonatate (TESSALON) 100 MG capsule Take 1 capsule (100 mg total) by mouth  3 (three) times daily as needed. 30 capsule 0  . Calcium Carbonate-Vitamin D (CALCIUM-VITAMIN D3 PO) Take by mouth daily.    . cyclobenzaprine (FLEXERIL) 5 MG tablet Take 1 tablet (5 mg total) by mouth at bedtime. 7 tablet 0  . diclofenac (VOLTAREN) 75 MG EC tablet Take 1 tablet (75 mg total) by mouth 2 (two) times daily. 20 tablet 0  . hydrochlorothiazide (MICROZIDE) 12.5 MG capsule Take 1 capsule (12.5 mg total) by mouth daily. 90 capsule 3  . levocetirizine (XYZAL) 5 MG tablet Take 1 tablet (5 mg total) by mouth every evening. 30 tablet 0  . methylPREDNISolone (MEDROL DOSEPAK) 4 MG TBPK tablet follow package directions    . mirabegron ER (MYRBETRIQ) 50 MG TB24 tablet Take 50 mg by mouth daily.    . ondansetron (ZOFRAN ODT) 8 MG disintegrating tablet Take 1 tablet (8 mg total) by mouth every 8 (eight) hours as needed for nausea or vomiting. 20 tablet 0  . pantoprazole (PROTONIX) 40 MG tablet Take 1 tablet (40 mg total) by mouth 2 (two) times daily. 180 tablet 3  . simvastatin (ZOCOR) 40 MG tablet Take 1 tablet (40 mg total) by mouth every evening. 90 tablet 3  . tamsulosin (FLOMAX) 0.4 MG CAPS capsule Take 2 capsules (0.8 mg total) by mouth daily. 90 capsule 1  . timolol (TIMOPTIC) 0.5 % ophthalmic solution Place 1 drop into the left eye daily. 10 mL  12  . traMADol (ULTRAM) 50 MG tablet 1 tab po q 8 hours prn pain 15 tablet 0  . triamcinolone ointment (KENALOG) 0.5 % Apply 1 application topically 2 (two) times daily. 30 g 0   No current facility-administered medications on file prior to visit.     Past Surgical History:  Procedure Laterality Date  . ABDOMINAL HERNIA REPAIR  1972  . APPENDECTOMY  1973  . BACK SURGERY  10/28/2017  . CATARACT EXTRACTION, BILATERAL  2005  . CHOLECYSTECTOMY  08/2016  . ESOPHAGOGASTRODUODENOSCOPY  06.2011  . EYE SURGERY  1972   Bilateral Lens Replacement  . FACIAL RECONSTRUCTION SURGERY  2008  . LEFT HEART CATH  02.17.2014  . [place], 2008    Dislocated Jaw  . PARS PLANA VITRECTOMY W/ REPAIR OF MACULAR HOLE  2005   macular hole repair, right eye  . PARS PLANA VITRECTOMY W/ REPAIR OF MACULAR HOLE  2010   mechanical, left eye  . PROSTATE BIOPSY  [place] 2017   w/ Dr. Nevada Crane  . WISDOM TOOTH EXTRACTION      Allergies  Allergen Reactions  . Gabapentin Other (See Comments)    Feel "weird"; Nausea; Weakness; Syncope    Social History   Socioeconomic History  . Marital status: Single    Spouse name: Not on file  . Number of children: Not on file  . Years of education: Not on file  . Highest education level: Not on file  Occupational History  . Not on file  Social Needs  . Financial resource strain: Not on file  . Food insecurity:    Worry: Not on file    Inability: Not on file  . Transportation needs:    Medical: Not on file    Non-medical: Not on file  Tobacco Use  . Smoking status: Former Smoker    Packs/day: 0.25    Years: 2.00    Pack years: 0.50    Types: Cigarettes    Last attempt to quit: 06/02/1966    Years since quitting: 51.7  . Smokeless tobacco: Never Used  Substance and Sexual Activity  . Alcohol use: No    Alcohol/week: 0.0 standard drinks  . Drug use: No  . Sexual activity: Never  Lifestyle  . Physical activity:    Days per week: Not on file    Minutes per session: Not on file  . Stress: Not on file  Relationships  . Social connections:    Talks on phone: Not on file    Gets together: Not on file    Attends religious service: Not on file    Active member of club or organization: Not on file    Attends meetings of clubs or organizations: Not on file    Relationship status: Not on file  . Intimate partner violence:    Fear of current or ex partner: Not on file    Emotionally abused: Not on file    Physically abused: Not on file    Forced sexual activity: Not on file  Other Topics Concern  . Not on file  Social History Narrative  . Not on file    Family History  Problem Relation  Age of Onset  . Alzheimer's disease Mother        Deceased  . Diabetes Maternal Grandmother   . Heart attack Maternal Grandmother   . Prostate cancer Maternal Uncle   . Diabetes Maternal Uncle   . Healthy Son   . Cataracts Maternal  Aunt     BP 126/80   Pulse (!) 57   Ht 5\' 6"  (1.676 m)   Wt 184 lb (83.5 kg)   BMI 29.70 kg/m   Review of Systems: See HPI above.     Objective:  Physical Exam:  Gen: NAD, comfortable in exam room  Right shoulder: No swelling, ecchymoses.  No gross deformity. Mild TTP lateral trapezius, over infraspinatus.  No AC, biceps tenderness. Full IR, ER.  Flexion and abduction to 90 degrees. Negative Hawkins, Neers. Negative Yergasons. Strength 5/5 with empty can and resisted internal/external rotation.  Pain empty can and ER. NV intact distally.  Left shoulder: No swelling, ecchymoses.  No gross deformity. Mild TTP lateral trapezius, infraspinatus. ROM 90 degrees flexion and abduction, Full ER and IR. Positive Hawkins, Neers. Negative Yergasons. Strength 5/5 with empty can and resisted internal/external rotation.  Pain empty can and ER. NV intact distally.   MSK u/s Left shoulder:  Biceps tendon intact on long and trans views.  Focal tenosynovitis anteriorly.  AC joint with mod arthropathy.  Subscapularis and Infraspinatus normal without tears.  Mod subacromial bursitis.  Supraspinatus with focal cortical irregularity and small insertional tear that on trans view appears near full thickness but only 36mm in width.  MSK u/s Right shoulder:  Biceps tendon intact on long and trans views.  Focal tenosynovitis anteriorly.  AC joint with mod arthropathy.  Subscapularis and Infraspinatus normal without tears.  Mod subacromial bursitis.  Supraspinatus with two interstitial tears about 4 and 38mm in width.  Assessment & Plan:  1. Bilateral shoulder pain - Radiographs negative.  Noted to have partial thickness supraspinatus tears with subacromial bursitis and  impingement.  Not improved with combination subacromial and glenohumeral injections.  Tears small and without retraction.  Treat conservatively - start physical therapy.  Tylenol, tramadol if needed.  F/u in 6 weeks.  Consider orthopedic referral if still struggling.

## 2018-03-08 ENCOUNTER — Ambulatory Visit: Payer: MEDICARE | Attending: Neurosurgery | Admitting: Physical Therapy

## 2018-03-08 DIAGNOSIS — R296 Repeated falls: Secondary | ICD-10-CM | POA: Insufficient documentation

## 2018-03-08 DIAGNOSIS — R2689 Other abnormalities of gait and mobility: Secondary | ICD-10-CM | POA: Insufficient documentation

## 2018-03-08 DIAGNOSIS — M6281 Muscle weakness (generalized): Secondary | ICD-10-CM | POA: Insufficient documentation

## 2018-03-08 DIAGNOSIS — R2681 Unsteadiness on feet: Secondary | ICD-10-CM | POA: Diagnosis present

## 2018-03-08 DIAGNOSIS — G8929 Other chronic pain: Secondary | ICD-10-CM | POA: Insufficient documentation

## 2018-03-08 DIAGNOSIS — M545 Low back pain: Secondary | ICD-10-CM | POA: Insufficient documentation

## 2018-03-08 NOTE — Therapy (Signed)
[facility] [address]  [place], [place], [address] Phone: [phone]   Fax:  [phone]  Physical Therapy Treatment  Patient Details  Name: Brian [name] MRN: [id number] Date of Birth: Jan 14, 1946 Referring Provider (PT): Mackie Pai, PA-C   Encounter Date: 03/08/2018  PT End of Session - 03/08/18 0845    Visit Number  2    Number of Visits  8    Date for PT Re-Evaluation  04/02/18    Authorization Type  Medicare [employer] & [employer]    PT Start Time  [phone]    PT Stop Time  0920    PT Time Calculation (min)  45 min    Equipment Utilized During Treatment  Gait belt    Activity Tolerance  Patient tolerated treatment well    Behavior During Therapy  [facility] for tasks assessed/performed       Past Medical History:  Diagnosis Date  . Atypical chest pain     Negative cardiac catheterization 2014  . 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 Blind right eye   . 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 Chicken pox   . Chronic back pain   . Dislocation, jaw 1960s  . Edema    Left Leg  . Essential hypertension, benign   . GERD (gastroesophageal reflux disease)   . [place] measles   . Heart attack ([place])   . Hyperlipidemia   . Measles   . Mumps   . Osteopenia 11.12.10   bone density test  . Personal history of other diseases of digestive system 08.10.12   Gastric ulcer    Past Surgical History:  Procedure Laterality Date  . ABDOMINAL HERNIA REPAIR  1972  . APPENDECTOMY  1973  . BACK SURGERY  10/28/2017  . CATARACT EXTRACTION, BILATERAL  2005  . CHOLECYSTECTOMY  08/2016  . ESOPHAGOGASTRODUODENOSCOPY  06.2011  . EYE SURGERY  1972   Bilateral Lens Replacement  . FACIAL RECONSTRUCTION SURGERY  2008  . LEFT HEART CATH  02.17.2014  . [place], 2008   Dislocated Jaw  . PARS PLANA VITRECTOMY W/ REPAIR OF MACULAR HOLE  2005   macular hole repair, right eye  . PARS PLANA VITRECTOMY  W/ REPAIR OF MACULAR HOLE  2010   mechanical, left eye  . PROSTATE BIOPSY  [place] 2017   w/ Dr. [place] Crane  . WISDOM TOOTH EXTRACTION      There were no vitals filed for this visit.  Subjective Assessment - 03/08/18 0840    Subjective  Pt reports having some bilat hip pain and back pain today, he had recent injection for this but relays he only had a day of relief from injection. He reports his walking and balance are "so so". He also relays his shoulder feels a little better however.     Pertinent History  Lumbar fusion - 10/28/17    Limitations  Standing;Lifting;House hold activities    Currently in Pain?  Yes    Pain Score  6     Pain Location  Hip    Pain Orientation  Right;Left    Pain Descriptors / Indicators  Stabbing    Pain Type  Chronic pain                       [facility] Adult PT Treatment/Exercise - 03/08/18 0001      Balance   Balance Assessed  Yes  Standardized Balance Assessment   Standardized Balance Assessment  Dynamic Gait Index      Dynamic Gait Index   Level Surface  Normal    Change in Gait Speed  Normal    Gait with Horizontal Head Turns  Mild Impairment    Gait with Vertical Head Turns  Mild Impairment    Gait and Pivot Turn  Normal    Step Over Obstacle  Normal    Step Around Obstacles  Normal    Steps  Normal    Total Score  22      Neuro Re-ed    Neuro Re-ed Details   tandem balance, standing feet together, standing on Airex feet apart eyes closed all 30 sec X 2.  SLS X 3 each leg ranging 3-15 sec hold abiility. Alt foot taps at treadmill 2X10 bilat without UE support. Side stepping and retrowalking 35 ft X 2 ea all CGA with gaitbelt, no LOB noted      Exercises   Exercises  Knee/Hip      Knee/Hip Exercises: Stretches   Active Hamstring Stretch  Right;Left;30 seconds;3 reps    Piriformis Stretch  Right;Left;1 rep;30 seconds    Piriformis Stretch Limitations  seated      Knee/Hip Exercises: Seated   Sit to Sand  2 sets;5  reps;without UE support               PT Short Term Goals - 12/14/17 0926      PT SHORT TERM GOAL #1   Title  Pt will be independent with initial HEP    Status  Achieved        PT Long Term Goals - 03/08/18 0934      PT LONG TERM GOAL #1   Title  Pt will be independent with advanced/ongoing HEP    Status  On-going      PT LONG TERM GOAL #2   Title  Pt to improve gross bilat LE strength to 4/5 to improve stability in standing    Status  On-going      PT LONG TERM GOAL #3   Title  Pt will improve Berg to >/= 52/56 to decrease risk for falls    Status  On-going      PT LONG TERM GOAL #4   Title  Pt will demonstrate ability to walk with head turns and maintain gait within a 12 inch pathway to improve ability to safely amb within community and decrease fall risk.     Status  On-going      PT LONG TERM GOAL #5   Title  Pt will report no futher instances of falls    Status  On-going            Plan - 03/08/18 4818    Clinical Impression Statement  Session focused on balance and dynamic gait today with good tolerance. DGI performed showing 22/24 so his balance may be improving some. He was overall CGA to supervision with dynamic balance training with no LOB noted. SLS is inconsistent however as he was able to hold some reps up to 15 sec and others only 3 sec. PT will continue to progress strength and balance as toleated.  He now has new PT prescription from MD to add in shoulder so RE will be performed next visit to add this into POC.    Rehab Potential  Good    Clinical Impairments Affecting Rehab Potential  Hearing difficulty, R eye visual deficits  PT Frequency  2x / week    PT Duration  4 weeks    PT Treatment/Interventions  Patient/family education;Neuromuscular re-education;Balance training;Therapeutic exercise;Therapeutic activities;Functional mobility training;Gait training;ADLs/Self Care Home Management;Manual techniques;Dry needling;Taping;Electrical  Stimulation;Moist Heat;Cryotherapy;Iontophoresis 4mg /ml Dexamethasone    PT Next Visit Plan  RE for shoulder    Consulted and Agree with Plan of Care  Patient       Patient will benefit from skilled therapeutic intervention in order to improve the following deficits and impairments:  Decreased balance, Decreased activity tolerance, Decreased safety awareness, Difficulty walking, Decreased mobility, Postural dysfunction, Improper body mechanics, Decreased strength, Pain  Visit Diagnosis: Repeated falls  Unsteadiness on feet  Muscle weakness (generalized)  Chronic bilateral low back pain without sciatica  Other abnormalities of gait and mobility     Problem List Patient Active Problem List   Diagnosis Date Noted  . Right shoulder pain 01/30/2017  . Left shoulder pain 05/19/2016  . Urinary hesitancy 10/18/2014  . Trapezius muscle spasm 10/18/2014  . Idiopathic neuropathy 10/18/2014  . Allergic reaction 09/28/2014  . Hyperglycemia 09/28/2014  . Acute bacterial bronchitis 08/18/2014  . Hematuria 08/18/2014  . Screening, ischemic heart disease 06/16/2014  . Lumbar radiculopathy, chronic 05/19/2014  . Midline thoracic back pain 03/24/2014  . BPPV (benign paroxysmal positional vertigo) 01/20/2014  . Right-sided low back pain with right-sided sciatica 12/21/2013  . Lightheadedness 09/22/2013  . Porokeratosis 02/25/2013  . Pain in joint, ankle and foot 02/25/2013  . Lumbosacral pain 02/15/2013  . Abdominal pain, epigastric 02/15/2013  . Acid reflux 02/15/2013  . Hearing loss 02/15/2013  . Preventive measure 02/15/2013  . Chest pain 07/19/2012  . Bradycardia, sinus 07/19/2012  . Borderline glaucoma 03/15/2012  . 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 Dislocated inferior maxilla 01/10/2011  . H/O gastric ulcer 01/10/2011  . Back pain, chronic 11/21/2010    Debbe Odea, PT, DPT 03/08/2018, 9:35 AM  [facility] [address]  [place], [place], [address] Phone: [phone]   Fax:  [phone]  Name: Brian [name] MRN: [id number] Date of Birth: 08-10-1945

## 2018-03-09 ENCOUNTER — Telehealth: Payer: Self-pay

## 2018-03-09 NOTE — Telephone Encounter (Signed)
Copied from [facility] [id number]. Topic: General - Other >> Mar 09, 2018 11:11 AM Janace Aris A wrote: Reason for CRM: patient called in wanting to let "Kennyth Lose" know when he retired, he says he gave her the wrong date. It was Feb of '09.

## 2018-03-10 ENCOUNTER — Ambulatory Visit: Payer: MEDICARE | Admitting: Physical Therapy

## 2018-03-10 ENCOUNTER — Encounter: Payer: Self-pay | Admitting: Physical Therapy

## 2018-03-10 DIAGNOSIS — R296 Repeated falls: Secondary | ICD-10-CM

## 2018-03-10 DIAGNOSIS — R2681 Unsteadiness on feet: Secondary | ICD-10-CM

## 2018-03-10 DIAGNOSIS — M6281 Muscle weakness (generalized): Secondary | ICD-10-CM

## 2018-03-10 NOTE — Therapy (Signed)
[facility] [address]  [place], [place], [address] Phone: [phone]   Fax:  [phone]  Physical Therapy Treatment  Patient Details  Name: Brian [name] MRN: [id number] Date of Birth: 1946-02-08 Referring Provider (PT): Mackie Pai, PA-C   Encounter Date: 03/10/2018  PT End of Session - 03/10/18 0845    Visit Number  3    Number of Visits  8    Date for PT Re-Evaluation  04/02/18    Authorization Type  Medicare RR & [employer]    PT Start Time  0845    PT Stop Time  0927    PT Time Calculation (min)  42 min    Equipment Utilized During Treatment  Gait belt    Activity Tolerance  Patient tolerated treatment well    Behavior During Therapy  [facility] for tasks assessed/performed       Past Medical History:  Diagnosis Date  . Atypical chest pain     Negative cardiac catheterization 2014  . 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 Blind right eye   . 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 Chicken pox   . Chronic back pain   . Dislocation, jaw 1960s  . Edema    Left Leg  . Essential hypertension, benign   . GERD (gastroesophageal reflux disease)   . [place] measles   . Heart attack ([place])   . Hyperlipidemia   . Measles   . Mumps   . Osteopenia 11.12.10   bone density test  . Personal history of other diseases of digestive system 08.10.12   Gastric ulcer    Past Surgical History:  Procedure Laterality Date  . ABDOMINAL HERNIA REPAIR  1972  . APPENDECTOMY  1973  . BACK SURGERY  10/28/2017  . CATARACT EXTRACTION, BILATERAL  2005  . CHOLECYSTECTOMY  08/2016  . ESOPHAGOGASTRODUODENOSCOPY  06.2011  . EYE SURGERY  1972   Bilateral Lens Replacement  . FACIAL RECONSTRUCTION SURGERY  2008  . LEFT HEART CATH  02.17.2014  . [place], 2008   Dislocated Jaw  . PARS PLANA VITRECTOMY W/ REPAIR OF MACULAR HOLE  2005   macular hole repair, right eye  . PARS PLANA VITRECTOMY  W/ REPAIR OF MACULAR HOLE  2010   mechanical, left eye  . PROSTATE BIOPSY  [place] 2017   w/ Dr. [place] Crane  . WISDOM TOOTH EXTRACTION      There were no vitals filed for this visit.  Subjective Assessment - 03/10/18 0848    Subjective  Referral received for PT to eval and treat for bilateral shoulder rotator cuff partial tears, bursitis and impingement. Discussed this with pt who opted to complete balance episode before addressing shoulder issues.    Pertinent History  Lumbar fusion - 10/28/17    Limitations  Standing;Lifting;House hold activities    Currently in Pain?  Yes    Pain Score  5     Pain Location  Hip    Pain Orientation  Left    Pain Descriptors / Indicators  Stabbing    Pain Type  Chronic pain    Pain Frequency  Constant    Pain Score  4    Pain Location  Shoulder    Pain Orientation  Left;Right;Lateral    Pain Descriptors / Indicators  Sore    Pain Type  Chronic pain    Pain Frequency  Constant  [facility] Adult PT Treatment/Exercise - 03/10/18 0845      High Level Balance   High Level Balance Activities  Weight-shifting turns;Figure 8 turns   90 & 180 dg turns     Lumbar Exercises: Aerobic   Recumbent Bike  L2 x 6 min          Balance Exercises - 03/10/18 0928      Balance Exercises: Standing   Cone Rotation  Solid surface;A/P;R/L    Cone Rotation Limitations  cone knock down & righting          PT Short Term Goals - 12/14/17 0926      PT SHORT TERM GOAL #1   Title  Pt will be independent with initial HEP    Status  Achieved        PT Long Term Goals - 03/08/18 0934      PT LONG TERM GOAL #1   Title  Pt will be independent with advanced/ongoing HEP    Status  On-going      PT LONG TERM GOAL #2   Title  Pt to improve gross bilat LE strength to 4/5 to improve stability in standing    Status  On-going      PT LONG TERM GOAL #3   Title  Pt will improve Berg to >/= 52/56 to decrease risk for falls    Status   On-going      PT LONG TERM GOAL #4   Title  Pt will demonstrate ability to walk with head turns and maintain gait within a 12 inch pathway to improve ability to safely amb within community and decrease fall risk.     Status  On-going      PT LONG TERM GOAL #5   Title  Pt will report no futher instances of falls    Status  On-going            Plan - 03/10/18 0853    Clinical Impression Statement  Brian [name] reporting greatest issues with balance tend to occur with turning activities both in standing and while walking, therefore targeted dynamic balance and gait activities incorporating turning and pivoting motions. Pt with intemittent mild LBO but able to self-correct in all instances. Upon discussion regarding shoulder referral, PT and patient opted to complete current balance episode before transitioning to shoulder eval.    Rehab Potential  Good    Clinical Impairments Affecting Rehab Potential  Hearing difficulty, R eye visual deficits     PT Treatment/Interventions  Patient/family education;Neuromuscular re-education;Balance training;Therapeutic exercise;Therapeutic activities;Functional mobility training;Gait training;ADLs/Self Care Home Management;Manual techniques;Dry needling;Taping;Electrical Stimulation;Moist Heat;Cryotherapy;Iontophoresis 4mg /ml Dexamethasone    Recommended Other Services  [place] eval deferred until completion of current episode    Consulted and Agree with Plan of Care  Patient       Patient will benefit from skilled therapeutic intervention in order to improve the following deficits and impairments:  Decreased balance, Decreased activity tolerance, Decreased safety awareness, Difficulty walking, Decreased mobility, Postural dysfunction, Improper body mechanics, Decreased strength, Pain  Visit Diagnosis: Repeated falls  Unsteadiness on feet  Muscle weakness (generalized)     Problem List Patient Active Problem List   Diagnosis Date Noted  . Right  shoulder pain 01/30/2017  . Left shoulder pain 05/19/2016  . Urinary hesitancy 10/18/2014  . Trapezius muscle spasm 10/18/2014  . Idiopathic neuropathy 10/18/2014  . Allergic reaction 09/28/2014  . Hyperglycemia 09/28/2014  . Acute bacterial bronchitis 08/18/2014  . Hematuria 08/18/2014  . Screening, ischemic heart  disease 06/16/2014  . Lumbar radiculopathy, chronic 05/19/2014  . Midline thoracic back pain 03/24/2014  . BPPV (benign paroxysmal positional vertigo) 01/20/2014  . Right-sided low back pain with right-sided sciatica 12/21/2013  . Lightheadedness 09/22/2013  . Porokeratosis 02/25/2013  . Pain in joint, ankle and foot 02/25/2013  . Lumbosacral pain 02/15/2013  . Abdominal pain, epigastric 02/15/2013  . Acid reflux 02/15/2013  . Hearing loss 02/15/2013  . Preventive measure 02/15/2013  . Chest pain 07/19/2012  . Bradycardia, sinus 07/19/2012  . Borderline glaucoma 03/15/2012  . 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 Dislocated inferior maxilla 01/10/2011  . H/O gastric ulcer 01/10/2011  . Back pain, chronic 11/21/2010    Percival Spanish, PT, MPT 03/10/2018, 10:00 AM  [facility] [address]  [place], [place], [address] Phone: [phone]   Fax:  [phone]  Name: Brian [name] MRN: [id number] Date of Birth: 1946/01/31

## 2018-03-17 ENCOUNTER — Ambulatory Visit: Payer: MEDICARE

## 2018-03-17 DIAGNOSIS — R296 Repeated falls: Secondary | ICD-10-CM

## 2018-03-17 DIAGNOSIS — M6281 Muscle weakness (generalized): Secondary | ICD-10-CM

## 2018-03-17 DIAGNOSIS — R2681 Unsteadiness on feet: Secondary | ICD-10-CM

## 2018-03-17 NOTE — Therapy (Signed)
[facility] [address]  [place], [place], [address] Phone: [phone]   Fax:  [phone]  Physical Therapy Treatment  Patient Details  Name: Brian [name] MRN: [id number] Date of Birth: 1946/04/18 Referring Provider (PT): Mackie Pai, PA-C   Encounter Date: 03/17/2018  PT End of Session - 03/17/18 0851    Visit Number  4    Number of Visits  8    Date for PT Re-Evaluation  04/02/18    Authorization Type  Medicare [employer]    PT Start Time  [phone]    PT Stop Time  0923    PT Time Calculation (min)  40 min    Equipment Utilized During Treatment  Gait belt    Activity Tolerance  Patient tolerated treatment well    Behavior During Therapy  [facility] for tasks assessed/performed       Past Medical History:  Diagnosis Date  . Atypical chest pain     Negative cardiac catheterization 2014  . 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 Blind right eye   . 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 Chicken pox   . Chronic back pain   . Dislocation, jaw 1960s  . Edema    Left Leg  . Essential hypertension, benign   . GERD (gastroesophageal reflux disease)   . [place] measles   . Heart attack ([place])   . Hyperlipidemia   . Measles   . Mumps   . Osteopenia 11.12.10   bone density test  . Personal history of other diseases of digestive system 08.10.12   Gastric ulcer    Past Surgical History:  Procedure Laterality Date  . ABDOMINAL HERNIA REPAIR  1972  . APPENDECTOMY  1973  . BACK SURGERY  10/28/2017  . CATARACT EXTRACTION, BILATERAL  2005  . CHOLECYSTECTOMY  08/2016  . ESOPHAGOGASTRODUODENOSCOPY  06.2011  . EYE SURGERY  1972   Bilateral Lens Replacement  . FACIAL RECONSTRUCTION SURGERY  2008  . LEFT HEART CATH  02.17.2014  . [place], 2008   Dislocated Jaw  . PARS PLANA VITRECTOMY W/ REPAIR OF MACULAR HOLE  2005   macular hole repair, right eye  . PARS PLANA VITRECTOMY  W/ REPAIR OF MACULAR HOLE  2010   mechanical, left eye  . PROSTATE BIOPSY  [place] 2017   w/ Dr. Nevada Crane  . WISDOM TOOTH EXTRACTION      There were no vitals filed for this visit.  Subjective Assessment - 03/17/18 0846    Subjective  Pt. primary concern today is hip pain.      Pertinent History  Lumbar fusion - 10/28/17    Patient Stated Goals  "to get a new body"    Currently in Pain?  Yes    Pain Score  6    L hip 6/10, R hip pain 4/10    Pain Location  Hip    Pain Orientation  Right;Left    Pain Descriptors / Indicators  Stabbing    Pain Type  Chronic pain    Pain Radiating Towards  down back of legs into feet; R > L      Pain Onset  More than a month ago    Pain Frequency  Constant    Aggravating Factors   nothing     Pain Relieving Factors  nothing     Multiple Pain Sites  Yes    Pain Score  3  Pain Location  Shoulder    Pain Orientation  Left;Right;Lateral    Pain Descriptors / Indicators  Sharp    Pain Type  Chronic pain    Pain Radiating Towards  none today    Pain Onset  More than a month ago    Pain Frequency  Intermittent    Aggravating Factors   lifting, reaching across body    Pain Relieving Factors  nothing                        [facility] Adult PT Treatment/Exercise - 03/17/18 0853      High Level Balance   High Level Balance Activities  Tandem walking;Negotitating around obstacles;Backward walking    High Level Balance Comments  Performed twenty quick turns around cones with ball toss for multi-tasking and supervision from therapist       Lumbar Exercises: Aerobic   Nustep  L7 x 6 min (LE only)      Lumbar Exercises: Seated   Sit to Stand  10 reps    Sit to Stand Limitations  no UE support       Lumbar Exercises: Supine   Clam  10 reps;3 seconds    Clam Limitations  red TB     Bridge  10 reps   2 sets    Bridge Limitations  Cues for consistent breathing      Knee/Hip Exercises: Standing   SLS with Vectors  SLS with opposite LE cone  toe touch 5 x 3 cones each; supervision/CGA from therapist                PT Short Term Goals - 12/14/17 0926      PT SHORT TERM GOAL #1   Title  Pt will be independent with initial HEP    Status  Achieved        PT Long Term Goals - 03/08/18 0934      PT LONG TERM GOAL #1   Title  Pt will be independent with advanced/ongoing HEP    Status  On-going      PT LONG TERM GOAL #2   Title  Pt to improve gross bilat LE strength to 4/5 to improve stability in standing    Status  On-going      PT LONG TERM GOAL #3   Title  Pt will improve Berg to >/= 52/56 to decrease risk for falls    Status  On-going      PT LONG TERM GOAL #4   Title  Pt will demonstrate ability to walk with head turns and maintain gait within a 12 inch pathway to improve ability to safely amb within community and decrease fall risk.     Status  On-going      PT LONG TERM GOAL #5   Title  Pt will report no futher instances of falls    Status  On-going            Plan - 03/17/18 0852    Clinical Impression Statement  Brian [name] primary concern to start session today was B hip pain however reported decrease in hip pain as session continued and not limited with standing activities due to pain.  Noted limited HEP adherence since last visit and encouraged by therapist to perform HEP for full benefit from therapy.  Session focused on dynamic balance and LE strengthening activities with pt. having most difficulty with dynamic SLS activities today requiring CGA from therapist.  Pt.  able to demo improvement in straight line path with head turns today without LOB.  Will continue to progress toward goals.      Clinical Impairments Affecting Rehab Potential  Hearing difficulty, R eye visual deficits     PT Treatment/Interventions  Patient/family education;Neuromuscular re-education;Balance training;Therapeutic exercise;Therapeutic activities;Functional mobility training;Gait training;ADLs/Self Care Home Management;Manual  techniques;Dry needling;Taping;Electrical Stimulation;Moist Heat;Cryotherapy;Iontophoresis 4mg /ml Dexamethasone    Consulted and Agree with Plan of Care  Patient       Patient will benefit from skilled therapeutic intervention in order to improve the following deficits and impairments:  Decreased balance, Decreased activity tolerance, Decreased safety awareness, Difficulty walking, Decreased mobility, Postural dysfunction, Improper body mechanics, Decreased strength, Pain  Visit Diagnosis: Repeated falls  Unsteadiness on feet  Muscle weakness (generalized)     Problem List Patient Active Problem List   Diagnosis Date Noted  . Right shoulder pain 01/30/2017  . Left shoulder pain 05/19/2016  . Urinary hesitancy 10/18/2014  . Trapezius muscle spasm 10/18/2014  . Idiopathic neuropathy 10/18/2014  . Allergic reaction 09/28/2014  . Hyperglycemia 09/28/2014  . Acute bacterial bronchitis 08/18/2014  . Hematuria 08/18/2014  . Screening, ischemic heart disease 06/16/2014  . Lumbar radiculopathy, chronic 05/19/2014  . Midline thoracic back pain 03/24/2014  . BPPV (benign paroxysmal positional vertigo) 01/20/2014  . Right-sided low back pain with right-sided sciatica 12/21/2013  . Lightheadedness 09/22/2013  . Porokeratosis 02/25/2013  . Pain in joint, ankle and foot 02/25/2013  . Lumbosacral pain 02/15/2013  . Abdominal pain, epigastric 02/15/2013  . Acid reflux 02/15/2013  . Hearing loss 02/15/2013  . Preventive measure 02/15/2013  . Chest pain 07/19/2012  . Bradycardia, sinus 07/19/2012  . Borderline glaucoma 03/15/2012  . 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 Dislocated inferior maxilla 01/10/2011  . H/O gastric ulcer 01/10/2011  . Back pain, chronic 11/21/2010    Bess Harvest, PTA 03/17/18 9:55 AM   [facility] [address]  [place], [place], [address] Phone: [phone]    Fax:  [phone]  Name: Brian [name] MRN: [id number] Date of Birth: 11-08-1945

## 2018-03-19 ENCOUNTER — Ambulatory Visit: Payer: MEDICARE

## 2018-03-19 DIAGNOSIS — R296 Repeated falls: Secondary | ICD-10-CM | POA: Diagnosis not present

## 2018-03-19 DIAGNOSIS — R2681 Unsteadiness on feet: Secondary | ICD-10-CM

## 2018-03-19 DIAGNOSIS — M6281 Muscle weakness (generalized): Secondary | ICD-10-CM

## 2018-03-19 NOTE — Therapy (Signed)
[facility] [address]  [place], [place], [address] Phone: [phone]   Fax:  [phone]  Physical Therapy Treatment  Patient Details  Name: Brian [name] MRN: [id number] Date of Birth: 08/24/1945 Referring Provider (PT): Mackie Pai, PA-C   Encounter Date: 03/19/2018  PT End of Session - 03/19/18 1029    Visit Number  5    Number of Visits  8    Date for PT Re-Evaluation  04/02/18    Authorization Type  Medicare [employer]    PT Start Time  1015    PT Stop Time  1054    PT Time Calculation (min)  39 min    Equipment Utilized During Treatment  --    Activity Tolerance  Patient tolerated treatment well    Behavior During Therapy  [facility] for tasks assessed/performed       Past Medical History:  Diagnosis Date  . Atypical chest pain     Negative cardiac catheterization 2014  . 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 Blind right eye   . 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 Chicken pox   . Chronic back pain   . Dislocation, jaw 1960s  . Edema    Left Leg  . Essential hypertension, benign   . GERD (gastroesophageal reflux disease)   . [place] measles   . Heart attack ([place])   . Hyperlipidemia   . Measles   . Mumps   . Osteopenia 11.12.10   bone density test  . Personal history of other diseases of digestive system 08.10.12   Gastric ulcer    Past Surgical History:  Procedure Laterality Date  . ABDOMINAL HERNIA REPAIR  1972  . APPENDECTOMY  1973  . BACK SURGERY  10/28/2017  . CATARACT EXTRACTION, BILATERAL  2005  . CHOLECYSTECTOMY  08/2016  . ESOPHAGOGASTRODUODENOSCOPY  06.2011  . EYE SURGERY  1972   Bilateral Lens Replacement  . FACIAL RECONSTRUCTION SURGERY  2008  . LEFT HEART CATH  02.17.2014  . [place], 2008   Dislocated Jaw  . PARS PLANA VITRECTOMY W/ REPAIR OF MACULAR HOLE  2005   macular hole repair, right eye  . PARS PLANA VITRECTOMY W/  REPAIR OF MACULAR HOLE  2010   mechanical, left eye  . PROSTATE BIOPSY  [place] 2017   w/ Dr. [place] Crane  . WISDOM TOOTH EXTRACTION      There were no vitals filed for this visit.  Subjective Assessment - 03/19/18 1025    Subjective  Pt. doing well today.      Pertinent History  Lumbar fusion - 10/28/17    Currently in Pain?  Yes    Pain Score  5     Pain Location  Hip    Pain Orientation  Right;Left    Pain Descriptors / Indicators  Stabbing    Pain Type  Chronic pain    Pain Radiating Towards  radiating into back     Pain Onset  More than a month ago    Multiple Pain Sites  Yes    Pain Score  0   up to 8/10 pain at worst with elevation/reaching    Pain Location  Shoulder    Pain Orientation  Left;Right    Pain Descriptors / Indicators  Sharp    Pain Type  Chronic pain    Pain Radiating Towards  none today    Pain Onset  More than  a month ago    Pain Frequency  Intermittent    Aggravating Factors   reaching or elevation activities                        [facility] Adult PT Treatment/Exercise - 03/19/18 1043      Self-Care   Self-Care  Other Self-Care Comments    Other Self-Care Comments   B tandem stance at chair 3 x 10 sec each way; B SLS 3 x 10 sec at chair each LE      Lumbar Exercises: Aerobic   Nustep  L7 x 6 min (LE only)      Knee/Hip Exercises: Standing   Heel Raises  Both;15 reps    Heel Raises Limitations  light UE support and no support at chair    Hip Flexion  Right;Left;Knee bent;10 reps    Hip Flexion Limitations  chair     Hip Abduction  Right;Left;10 reps;Knee straight    Abduction Limitations  chair     Hip Extension  Right;Left;Stengthening;10 reps;Knee straight    Extension Limitations  chair     Other Standing Knee Exercises  Standing toe raises x 20 reps at chair              PT Education - 03/19/18 1054    Education Details  HEP update     Person(s) Educated  Patient    Methods  Explanation;Demonstration;Verbal cues;Handout     Comprehension  Verbalized understanding;Returned demonstration;Verbal cues required;Need further instruction       PT Short Term Goals - 12/14/17 0926      PT SHORT TERM GOAL #1   Title  Pt will be independent with initial HEP    Status  Achieved        PT Long Term Goals - 03/08/18 0934      PT LONG TERM GOAL #1   Title  Pt will be independent with advanced/ongoing HEP    Status  On-going      PT LONG TERM GOAL #2   Title  Pt to improve gross bilat LE strength to 4/5 to improve stability in standing    Status  On-going      PT LONG TERM GOAL #3   Title  Pt will improve Berg to >/= 52/56 to decrease risk for falls    Status  On-going      PT LONG TERM GOAL #4   Title  Pt will demonstrate ability to walk with head turns and maintain gait within a 12 inch pathway to improve ability to safely amb within community and decrease fall risk.     Status  On-going      PT LONG TERM GOAL #5   Title  Pt will report no futher instances of falls    Status  On-going            Plan - 03/19/18 1236    Clinical Impression Statement  Brian [name] doing well today.  HEP updated and issued to pt. including proximal hip strengthening and balance activities.  Pt. tolerated all activities in session well today.  Progressing toward goals.      PT Treatment/Interventions  Patient/family education;Neuromuscular re-education;Balance training;Therapeutic exercise;Therapeutic activities;Functional mobility training;Gait training;ADLs/Self Care Home Management;Manual techniques;Dry needling;Taping;Electrical Stimulation;Moist Heat;Cryotherapy;Iontophoresis 4mg /ml Dexamethasone    Consulted and Agree with Plan of Care  Patient       Patient will benefit from skilled therapeutic intervention in order to improve the following deficits and  impairments:  Decreased balance, Decreased activity tolerance, Decreased safety awareness, Difficulty walking, Decreased mobility, Postural dysfunction, Improper body  mechanics, Decreased strength, Pain  Visit Diagnosis: Repeated falls  Unsteadiness on feet  Muscle weakness (generalized)     Problem List Patient Active Problem List   Diagnosis Date Noted  . Right shoulder pain 01/30/2017  . Left shoulder pain 05/19/2016  . Urinary hesitancy 10/18/2014  . Trapezius muscle spasm 10/18/2014  . Idiopathic neuropathy 10/18/2014  . Allergic reaction 09/28/2014  . Hyperglycemia 09/28/2014  . Acute bacterial bronchitis 08/18/2014  . Hematuria 08/18/2014  . Screening, ischemic heart disease 06/16/2014  . Lumbar radiculopathy, chronic 05/19/2014  . Midline thoracic back pain 03/24/2014  . BPPV (benign paroxysmal positional vertigo) 01/20/2014  . Right-sided low back pain with right-sided sciatica 12/21/2013  . Lightheadedness 09/22/2013  . Porokeratosis 02/25/2013  . Pain in joint, ankle and foot 02/25/2013  . Lumbosacral pain 02/15/2013  . Abdominal pain, epigastric 02/15/2013  . Acid reflux 02/15/2013  . Hearing loss 02/15/2013  . Preventive measure 02/15/2013  . Chest pain 07/19/2012  . Bradycardia, sinus 07/19/2012  . Borderline glaucoma 03/15/2012  . 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 Dislocated inferior maxilla 01/10/2011  . H/O gastric ulcer 01/10/2011  . Back pain, chronic 11/21/2010    Bess Harvest, PTA 03/19/18 12:39 PM   [facility] [address]  [place], [place], [address] Phone: [phone]   Fax:  [phone]  Name: Brian [name] MRN: [id number] Date of Birth: [birth year]

## 2018-03-22 ENCOUNTER — Ambulatory Visit: Payer: MEDICARE | Admitting: Physical Therapy

## 2018-03-22 ENCOUNTER — Encounter: Payer: Self-pay | Admitting: Physical Therapy

## 2018-03-22 DIAGNOSIS — M6281 Muscle weakness (generalized): Secondary | ICD-10-CM

## 2018-03-22 DIAGNOSIS — R296 Repeated falls: Secondary | ICD-10-CM

## 2018-03-22 DIAGNOSIS — R2681 Unsteadiness on feet: Secondary | ICD-10-CM

## 2018-03-22 NOTE — Therapy (Signed)
[facility] [address]  [place], [place], [address] Phone: [phone]   Fax:  [phone]  Physical Therapy Treatment  Patient Details  Name: Brian [name] MRN: [id number] Date of Birth: 1946/01/31 Referring Provider (PT): Mackie Pai, PA-C   Encounter Date: 03/22/2018  PT End of Session - 03/22/18 0935    Visit Number  6    Number of Visits  8    Date for PT Re-Evaluation  04/02/18    Authorization Type  Medicare [employer] & [employer]    PT Start Time  0935    PT Stop Time  1017    PT Time Calculation (min)  42 min    Activity Tolerance  Patient tolerated treatment well    Behavior During Therapy  [facility] for tasks assessed/performed       Past Medical History:  Diagnosis Date  . Atypical chest pain     Negative cardiac catheterization 2014  . 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 Blind right eye   . 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 Chicken pox   . Chronic back pain   . Dislocation, jaw 1960s  . Edema    Left Leg  . Essential hypertension, benign   . GERD (gastroesophageal reflux disease)   . [place] measles   . Heart attack ([place])   . Hyperlipidemia   . Measles   . Mumps   . Osteopenia 11.12.10   bone density test  . Personal history of other diseases of digestive system 08.10.12   Gastric ulcer    Past Surgical History:  Procedure Laterality Date  . ABDOMINAL HERNIA REPAIR  1972  . APPENDECTOMY  1973  . BACK SURGERY  10/28/2017  . CATARACT EXTRACTION, BILATERAL  2005  . CHOLECYSTECTOMY  08/2016  . ESOPHAGOGASTRODUODENOSCOPY  06.2011  . EYE SURGERY  1972   Bilateral Lens Replacement  . FACIAL RECONSTRUCTION SURGERY  2008  . LEFT HEART CATH  02.17.2014  . [place], 2008   Dislocated Jaw  . PARS PLANA VITRECTOMY W/ REPAIR OF MACULAR HOLE  2005   macular hole repair, right eye  . PARS PLANA VITRECTOMY W/ REPAIR OF MACULAR HOLE  2010   mechanical, left  eye  . PROSTATE BIOPSY  [place] 2017   w/ Dr. [place] Crane  . WISDOM TOOTH EXTRACTION      There were no vitals filed for this visit.  Subjective Assessment - 03/22/18 0938    Subjective  Pt feeling like balance is improving - reporting no recent falls or near misses recently.    Pertinent History  Lumbar fusion - 10/28/17    Patient Stated Goals  "to get a new body"    Currently in Pain?  Yes    Pain Score  4    with movement   Pain Location  Hip    Pain Orientation  Right;Left    Pain Descriptors / Indicators  Pressure    Pain Type  Chronic pain    Pain Frequency  Constant    Pain Onset  More than a month ago         [facility] PT Assessment - 03/22/18 0935      Assessment   Medical Diagnosis  Imbalance & h/o falls    Referring Provider (PT)  Mackie Pai, PA-C      Strength   Overall Strength Comments  tested in sitting    Right Hip Flexion  4+/5  Right Hip Extension  3+/5    Right Hip ABduction  4+/5    Right Hip ADduction  4/5    Left Hip Flexion  4+/5    Left Hip Extension  3+/5    Left Hip ABduction  4+/5    Left Hip ADduction  4/5    Right Knee Flexion  4/5    Right Knee Extension  4+/5    Left Knee Flexion  4/5    Left Knee Extension  4+/5    Right Ankle Dorsiflexion  4+/5    Right Ankle Plantar Flexion  4+/5    Left Ankle Dorsiflexion  4+/5    Left Ankle Plantar Flexion  4+/5      Ambulation/Gait   Gait velocity  3.66 ft/sec      Standardized Balance Assessment   10 Meter Walk  8.97 sec      Berg Balance Test   Sit to Stand  Able to stand without using hands and stabilize independently    Standing Unsupported  Able to stand safely 2 minutes    Sitting with Back Unsupported but Feet Supported on Floor or Stool  Able to sit safely and securely 2 minutes    Stand to Sit  Sits safely with minimal use of hands    Transfers  Able to transfer safely, minor use of hands    Standing Unsupported with Eyes Closed  Able to stand 10 seconds safely    Standing  Ubsupported with Feet Together  Able to place feet together independently and stand 1 minute safely    From Standing, Reach Forward with Outstretched Arm  Can reach confidently >25 cm (10")    From Standing Position, Pick up Object from Floor  Able to pick up shoe safely and easily    From Standing Position, Turn to Look Behind Over each Shoulder  Looks behind from both sides and weight shifts well    Turn 360 Degrees  Able to turn 360 degrees safely in 4 seconds or less    Standing Unsupported, Alternately Place Feet on Step/Stool  Able to stand independently and safely and complete 8 steps in 20 seconds    Standing Unsupported, One Foot in Front  Able to place foot tandem independently and hold 30 seconds    Standing on One Leg  Able to lift leg independently and hold > 10 seconds    Total Score  56                   [facility] Adult PT Treatment/Exercise - 03/22/18 0935      High Level Balance   High Level Balance Activities  Side stepping;Braiding;Backward walking;Direction changes;Turns;Sudden stops;Weight-shifting turns      Exercises   Exercises  Knee/Hip      Lumbar Exercises: Aerobic   Nustep  L7 x 6 min (LE only)      Knee/Hip Exercises: Standing   Hip Flexion  Right;Left;10 reps;Knee straight;Stengthening    Hip Flexion Limitations  looped red TB at ankles, cues for abdominal bracing; intermittent UE support on counter    Hip Abduction  Right;Left;10 reps;Knee straight;Stengthening    Abduction Limitations  looped red TB at ankles, cues for abdominal bracing; intermittent UE support on counter    Hip Extension  Right;Left;10 reps;Knee straight;Stengthening    Extension Limitations  looped red TB at ankles, cues for abdominal bracing; intermittent UE support on counter  PT Short Term Goals - 12/14/17 0926      PT SHORT TERM GOAL #1   Title  Pt will be independent with initial HEP    Status  Achieved        PT Long Term Goals - 03/22/18  0941      PT LONG TERM GOAL #1   Title  Pt will be independent with advanced/ongoing HEP    Status  Achieved      PT LONG TERM GOAL #2   Title  Pt to improve gross bilat LE strength to 4/5 to improve stability in standing    Status  Partially Met      PT LONG TERM GOAL #3   Title  Pt will improve Berg to >/= 52/56 to decrease risk for falls    Status  Achieved      PT LONG TERM GOAL #4   Title  Pt will demonstrate ability to walk with head turns and maintain gait within a 12 inch pathway to improve ability to safely amb within community and decrease fall risk.     Status  Achieved      PT LONG TERM GOAL #5   Title  Pt will report no futher instances of falls    Status  Achieved            Plan - 03/22/18 1017    Clinical Impression Statement  Brian [name] doing well - reporting improving balance and no recent instances of falls. Recent HEP update reviewed & red theraband provided for progression of hip strengthening. Challenged balance with dynamic gait activities including stepping in multiple directions with head turns and body orientation turns which pt reports is when he feels mostl likely to fall - pt able to complete all activities w/o LOB or unsteadiness observed. Given good performance, reassessed goals inlcuding standardized balance tests with pt able to acheive a perferct score of 56/56 on the Berg as well as increased gait speed. All goals met except B hip extension strength only 3+/5 (pt aware of continued HEP exercises to address this weakness) and pt would liake to transiton balance POC to HEP so that he may proceed with evaulation of his shoulder issues.    Rehab Potential  Good    PT Treatment/Interventions  Patient/family education;Neuromuscular re-education;Balance training;Therapeutic exercise;Therapeutic activities;Functional mobility training;Gait training;ADLs/Self Care Home Management;Manual techniques;Dry needling;Taping;Electrical Stimulation;Moist  Heat;Cryotherapy;Iontophoresis 19m/ml Dexamethasone    PT Next Visit Plan  Discharge balance/gait instability POC; Eval for shoulder    Consulted and Agree with Plan of Care  Patient       Patient will benefit from skilled therapeutic intervention in order to improve the following deficits and impairments:  Decreased balance, Decreased activity tolerance, Decreased safety awareness, Difficulty walking, Decreased mobility, Postural dysfunction, Improper body mechanics, Decreased strength, Pain  Visit Diagnosis: Repeated falls  Unsteadiness on feet  Muscle weakness (generalized)     Problem List Patient Active Problem List   Diagnosis Date Noted  . Right shoulder pain 01/30/2017  . Left shoulder pain 05/19/2016  . Urinary hesitancy 10/18/2014  . Trapezius muscle spasm 10/18/2014  . Idiopathic neuropathy 10/18/2014  . Allergic reaction 09/28/2014  . Hyperglycemia 09/28/2014  . Acute bacterial bronchitis 08/18/2014  . Hematuria 08/18/2014  . Screening, ischemic heart disease 06/16/2014  . Lumbar radiculopathy, chronic 05/19/2014  . Midline thoracic back pain 03/24/2014  . BPPV (benign paroxysmal positional vertigo) 01/20/2014  . Right-sided low back pain with right-sided sciatica 12/21/2013  . Lightheadedness 09/22/2013  .  Porokeratosis 02/25/2013  . Pain in joint, ankle and foot 02/25/2013  . Lumbosacral pain 02/15/2013  . Abdominal pain, epigastric 02/15/2013  . Acid reflux 02/15/2013  . Hearing loss 02/15/2013  . Preventive measure 02/15/2013  . Chest pain 07/19/2012  . Bradycardia, sinus 07/19/2012  . Borderline glaucoma 03/15/2012  . 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 Dislocated inferior maxilla 01/10/2011  . H/O gastric ulcer 01/10/2011  . Back pain, chronic 11/21/2010   PHYSICAL THERAPY DISCHARGE SUMMARY  Visits from Start of Care: 6  Current functional level related to goals / functional outcomes:   Refer to above clinical  impression.   Remaining deficits:   As above.   Education / Equipment:   HEP  Plan: Patient agrees to discharge.  Patient goals were mostly met. Patient is being discharged due to being pleased with the current functional level.  ?????      Percival Spanish, PT, MPT 03/22/2018, 3:20 PM  [facility] [address]  [place], [place], [address] Phone: [phone]   Fax:  [phone]  Name: Brian [name] MRN: [id number] Date of Birth: 04-29-1946

## 2018-03-24 ENCOUNTER — Ambulatory Visit: Payer: MEDICARE | Attending: Family Medicine | Admitting: Physical Therapy

## 2018-03-24 ENCOUNTER — Other Ambulatory Visit: Payer: Self-pay

## 2018-03-24 ENCOUNTER — Encounter: Payer: Self-pay | Admitting: Physical Therapy

## 2018-03-24 DIAGNOSIS — M25612 Stiffness of left shoulder, not elsewhere classified: Secondary | ICD-10-CM | POA: Diagnosis present

## 2018-03-24 DIAGNOSIS — M6281 Muscle weakness (generalized): Secondary | ICD-10-CM | POA: Insufficient documentation

## 2018-03-24 DIAGNOSIS — M25611 Stiffness of right shoulder, not elsewhere classified: Secondary | ICD-10-CM | POA: Diagnosis present

## 2018-03-24 DIAGNOSIS — M25512 Pain in left shoulder: Secondary | ICD-10-CM | POA: Insufficient documentation

## 2018-03-24 DIAGNOSIS — M25511 Pain in right shoulder: Secondary | ICD-10-CM | POA: Diagnosis present

## 2018-03-24 DIAGNOSIS — R293 Abnormal posture: Secondary | ICD-10-CM | POA: Diagnosis present

## 2018-03-24 DIAGNOSIS — G8929 Other chronic pain: Secondary | ICD-10-CM | POA: Diagnosis present

## 2018-03-24 NOTE — Therapy (Signed)
[facility] [address]  [place], [place], [address] Phone: [phone]   Fax:  [phone]  Physical Therapy Evaluation  Patient Details  Name: Brian [name] MRN: [id number] Date of Birth: Sep 05, 1945 Referring Provider (PT): Chuck Hint, MD   Encounter Date: 03/24/2018  PT End of Session - 03/24/18 0930    Visit Number  1    Number of Visits  10    Date for PT Re-Evaluation  04/28/18    Authorization Type  Medicare RR & [employer]    PT Start Time  0930    PT Stop Time  1017    PT Time Calculation (min)  47 min    Activity Tolerance  Patient tolerated treatment well    Behavior During Therapy  [facility] for tasks assessed/performed       Past Medical History:  Diagnosis Date  . Atypical chest pain     Negative cardiac catheterization 2014  . 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 Blind right eye   . 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 Chicken pox   . Chronic back pain   . Dislocation, jaw 1960s  . Edema    Left Leg  . Essential hypertension, benign   . GERD (gastroesophageal reflux disease)   . [place] measles   . Heart attack ([place])   . Hyperlipidemia   . Measles   . Mumps   . Osteopenia 11.12.10   bone density test  . Personal history of other diseases of digestive system 08.10.12   Gastric ulcer    Past Surgical History:  Procedure Laterality Date  . ABDOMINAL HERNIA REPAIR  1972  . APPENDECTOMY  1973  . BACK SURGERY  10/28/2017  . CATARACT EXTRACTION, BILATERAL  2005  . CHOLECYSTECTOMY  08/2016  . ESOPHAGOGASTRODUODENOSCOPY  06.2011  . EYE SURGERY  1972   Bilateral Lens Replacement  . FACIAL RECONSTRUCTION SURGERY  2008  . LEFT HEART CATH  02.17.2014  . [place], 2008   Dislocated Jaw  . PARS PLANA VITRECTOMY W/ REPAIR OF MACULAR HOLE  2005   macular hole repair, right eye  . PARS PLANA VITRECTOMY W/ REPAIR OF MACULAR HOLE  2010   mechanical, left  eye  . PROSTATE BIOPSY  [place] 2017   w/ Dr. [place] Crane  . WISDOM TOOTH EXTRACTION      There were no vitals filed for this visit.   Subjective Assessment - 03/24/18 0933    Subjective  Pt reporting >1 yr h/o pain in L shoulder with overhead motions or reaching across body, and pain in R shoulder with lifting heavier objects such as a gallon of milk. Pt reports remote h/o L shoulder & clavicle dislocation as a teenager, but did not have issues with this for most of his life.    Pertinent History  Lumbar fusion - 10/28/17    Limitations  Lifting;House hold activities    Diagnostic tests  03/04/18 - [facility] u/s Left shoulder: Biceps tendon intact on long and trans views. Focal tenosynovitis anteriorly. AC joint with mod arthropathy. Subscapularis and Infraspinatus normal without tears. Mod subacromial bursitis. Supraspinatus with focal cortical irregularity and small insertional tear that on trans view appears near full thickness but only 77mm in width.  [facility] u/s Right shoulder: Biceps tendon intact on long and trans views. Focal tenosynovitis anteriorly. AC joint with mod arthropathy. Subscapularis and Infraspinatus normal without tears. Mod subacromial bursitis. Supraspinatus with two interstitial  tears about 4 and 57mm in width.    Patient Stated Goals  "to stop having any pain"    Currently in Pain?  Yes    Pain Score  0-No pain   up to 8/10 with overhead or cross-body motion   Pain Location  Shoulder    Pain Orientation  Left    Pain Descriptors / Indicators  Sharp;Burning    Pain Type  Chronic pain    Pain Radiating Towards  L lateral upper arm    Pain Onset  More than a month ago    Pain Frequency  Intermittent    Aggravating Factors   reaching overhead or across body    Pain Relieving Factors  nothing    Effect of Pain on Daily Activities  limits UE/UB dressing - has to put L UE in shirt/coat first    Pain Score  0   up to 6/10 with lifting   Pain Location  Shoulder    Pain  Orientation  Right    Pain Descriptors / Indicators  Sharp;Burning    Pain Type  Chronic pain    Pain Onset  More than a month ago    Pain Frequency  Intermittent    Aggravating Factors   lifting medium to heavy objects    Pain Relieving Factors  nothing    Effect of Pain on Daily Activities  limited ability to lift objects         [facility] PT Assessment - 03/24/18 0930      Assessment   Medical Diagnosis  B shoulder pain    Referring Provider (PT)  Chuck Hint, MD    Onset Date/Surgical Date  --   >1 yr   Hand Dominance  Right    Next MD Visit  04/15/18      Balance Screen   Has the patient fallen in the past 6 months  Yes    How many times?  3    Has the patient had a decrease in activity level because of a fear of falling?   No    Is the patient reluctant to leave their home because of a fear of falling?   No      Home Environment   Living Environment  Private residence    Type of [place] Access  Stairs to enter    Entrance Stairs-Number of Steps  1    Home Layout  Two level;Bed/bath upstairs    Alternate Level Stairs-Number of Steps  15    Alternate Level Stairs-Rails  Right;Left;Can reach both    Home Equipment  Grab bars - tub/shower;Bedside commode   grab bar on bed     Prior Function   Level of Independence  Independent    Vocation  Retired    Leisure  listen to books, cook/bake       Cognition   Overall Cognitive Status  Within Functional Limits for tasks assessed      Observation/Other Assessments   Focus on Therapeutic Outcomes (FOTO)   Shoulder - 45% (55% limitation); predicted 60% (40% limitation)      Posture/Postural Control   Posture/Postural Control  Postural limitations    Postural Limitations  Forward head;Rounded Shoulders      ROM / Strength   AROM / PROM / Strength  AROM;PROM;Strength      AROM   AROM Assessment Site  Shoulder    Right/Left Shoulder  Right;Left    Right Shoulder  Flexion  125 Degrees    Right Shoulder  ABduction  107 Degrees    Right Shoulder Internal Rotation  --   FIR to T12   Right Shoulder External Rotation  --   FER to T3   Left Shoulder Flexion  102 Degrees    Left Shoulder ABduction  96 Degrees    Left Shoulder Internal Rotation  --   FIR to L1   Left Shoulder External Rotation  --   FER to T1     PROM   PROM Assessment Site  Shoulder    Right/Left Shoulder  Right;Left    Right Shoulder Flexion  150 Degrees    Right Shoulder ABduction  115 Degrees    Right Shoulder Internal Rotation  74 Degrees    Right Shoulder External Rotation  68 Degrees    Left Shoulder Flexion  135 Degrees    Left Shoulder ABduction  115 Degrees    Left Shoulder Internal Rotation  64 Degrees    Left Shoulder External Rotation  46 Degrees      Strength   Strength Assessment Site  Shoulder    Right/Left Shoulder  Right;Left    Right Shoulder Flexion  4/5    Right Shoulder ABduction  4/5    Right Shoulder Internal Rotation  4/5    Right Shoulder External Rotation  4/5    Left Shoulder Flexion  4/5    Left Shoulder ABduction  4/5    Left Shoulder Internal Rotation  3+/5    Left Shoulder External Rotation  4-/5      Palpation   Palpation comment  ttp t/o B pecs, UT, LS, teres group, subscapularis as well as R infra & supraspinatus                Objective measurements completed on examination: See above findings.      [facility] Adult PT Treatment/Exercise - 03/24/18 0930      Exercises   Exercises  Shoulder      Shoulder Exercises: Standing   Retraction  Both;10 reps   5 sec hold     Shoulder Exercises: Stretch   Corner Stretch  30 seconds;1 rep    Corner Stretch Limitations  3 way doorway stretch - cues fro upright posture & to avoid painful range/motion             PT Education - 03/24/18 1017    Education Details  Initial shoulder HEP    Person(s) Educated  Patient    Methods  Explanation;Demonstration;Handout    Comprehension  Verbalized understanding;Returned  demonstration;Need further instruction       PT Short Term Goals - 03/24/18 1017      PT SHORT TERM GOAL #1   Title  Pt will be independent with initial HEP    Status  New    Target Date  04/07/18      PT SHORT TERM GOAL #2   Title  Pt will verbalize/demonstrate understanding of neutral shoulder posture to reduce risk of impingement with overhead motion    Status  New    Target Date  04/07/18        PT Long Term Goals - 03/24/18 1017      PT LONG TERM GOAL #1   Title  Pt will be independent with advanced/ongoing HEP    Status  New    Target Date  04/28/18      PT LONG TERM GOAL #2   Title  B shoulder  ROM WFL w/o increased pain to increase ease of ADLs    Status  New    Target Date  04/28/18      PT LONG TERM GOAL #3   Title  B shoulder strength >/= 4/5 to 4+/5 for improved function    Status  New    Target Date  04/28/18      PT LONG TERM GOAL #4   Title  Pt will report >/= 50% reduction in B shoulder pain    Status  New    Target Date  04/28/18      PT LONG TERM GOAL #5   Title  Pt will report >/= 50% improvement in ability to complete ADLs and basic household chores w/o limitation due to B shoulder pain, LOM or weakness    Status  New    Target Date  04/28/18             Plan - 03/24/18 1017    Clinical Impression Statement  Brian [name] is a 72 y/o male referred to OP PT for B shoulder pain as a result of B shoulder rotator cuff partial tears, bursitis and impingement. Pain is chronic for >1 yr but exacerbated by a fall ~2 months ago where he landed on the back of his shoulders. Pt has received PT twice recently - initially following lumbar fusion surgery in May and more recently for balance and gait instability with multiple recent falls, with balance episode just completed 2 days ago. Pt demonstrates forward head and shoulder posture with B scapula protracted and rounded forward on ribs, ttp and increased muscular tightness t/o B shoulder complexes especially in B  pecs, UT, LS, teres group, subscapularis as well as R infra & supraspinatus. B shoulder AROM and PROM limited due to pain and tightness, with mild to moderate shoulder/RTC weakness evident. Pain and LOM limit ability to reach overhead or lift moderate to heavy weight object. Brian [name] will benefit from skilled PT to address above deficits to reduce pain and maximize functional use of B UE.    History and Personal Factors relevant to plan of care:  Recent lumbar fusion (10/28/17) with onoing B hip pain; hearing loss; blind in R eye; + complex medical history     Clinical Presentation  Evolving    Clinical Presentation due to:  acute exacerbation of chronic B shoulder pain following a fall + multiple recent/ongoing musculoskeletal issues & complex medical history    Clinical Decision Making  Moderate    Rehab Potential  Good    Clinical Impairments Affecting Rehab Potential  Hearing difficulty, R eye visual deficits     PT Frequency  2x / week    PT Duration  4 weeks    PT Treatment/Interventions  Patient/family education;ADLs/Self Care Home Management;Neuromuscular re-education;Therapeutic exercise;Therapeutic activities;Electrical Stimulation;Moist Heat;Cryotherapy;Iontophoresis 4mg /ml Dexamethasone;Ultrasound;Manual techniques;Dry needling;Passive range of motion;Taping    Consulted and Agree with Plan of Care  Patient       Patient will benefit from skilled therapeutic intervention in order to improve the following deficits and impairments:  Pain, Postural dysfunction, Improper body mechanics, Decreased range of motion, Decreased strength, Impaired UE functional use, Decreased activity tolerance, Increased muscle spasms, Impaired flexibility  Visit Diagnosis: Chronic left shoulder pain  Chronic right shoulder pain  Abnormal posture  Stiffness of left shoulder, not elsewhere classified  Stiffness of right shoulder, not elsewhere classified  Muscle weakness (generalized)     Problem  List Patient Active Problem List   Diagnosis Date Noted  .  Right shoulder pain 01/30/2017  . Left shoulder pain 05/19/2016  . Urinary hesitancy 10/18/2014  . Trapezius muscle spasm 10/18/2014  . Idiopathic neuropathy 10/18/2014  . Allergic reaction 09/28/2014  . Hyperglycemia 09/28/2014  . Acute bacterial bronchitis 08/18/2014  . Hematuria 08/18/2014  . Screening, ischemic heart disease 06/16/2014  . Lumbar radiculopathy, chronic 05/19/2014  . Midline thoracic back pain 03/24/2014  . BPPV (benign paroxysmal positional vertigo) 01/20/2014  . Right-sided low back pain with right-sided sciatica 12/21/2013  . Lightheadedness 09/22/2013  . Porokeratosis 02/25/2013  . Pain in joint, ankle and foot 02/25/2013  . Lumbosacral pain 02/15/2013  . Abdominal pain, epigastric 02/15/2013  . Acid reflux 02/15/2013  . Hearing loss 02/15/2013  . Preventive measure 02/15/2013  . Chest pain 07/19/2012  . Bradycardia, sinus 07/19/2012  . Borderline glaucoma 03/15/2012  . 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 Dislocated inferior maxilla 01/10/2011  . H/O gastric ulcer 01/10/2011  . Back pain, chronic 11/21/2010    Percival Spanish, PT, MPT 03/24/2018, 6:00 PM  [facility] [address]  [place], [place], [address] Phone: [phone]   Fax:  [phone]  Name: Brian [name] MRN: [id number] Date of Birth: 02/11/1946

## 2018-03-25 ENCOUNTER — Ambulatory Visit: Payer: MEDICARE | Admitting: Medical

## 2018-03-29 ENCOUNTER — Ambulatory Visit (INDEPENDENT_AMBULATORY_CARE_PROVIDER_SITE_OTHER): Payer: MEDICARE | Admitting: Medical

## 2018-03-29 ENCOUNTER — Ambulatory Visit: Payer: MEDICARE

## 2018-03-29 ENCOUNTER — Encounter: Payer: Self-pay | Admitting: Medical

## 2018-03-29 ENCOUNTER — Ambulatory Visit (HOSPITAL_BASED_OUTPATIENT_CLINIC_OR_DEPARTMENT_OTHER)
Admission: RE | Admit: 2018-03-29 | Discharge: 2018-03-29 | Disposition: A | Discharge status: Home / Self Care | Payer: MEDICARE | Source: Ambulatory Visit | Attending: Medical | Admitting: Medical

## 2018-03-29 ENCOUNTER — Telehealth: Payer: Self-pay | Admitting: Medical

## 2018-03-29 VITALS — BP 126/69 | HR 52 | Temp 97.5°F | Resp 16 | Ht 64.0 in | Wt 182.6 lb

## 2018-03-29 DIAGNOSIS — R739 Hyperglycemia, unspecified: Secondary | ICD-10-CM

## 2018-03-29 DIAGNOSIS — R1084 Generalized abdominal pain: Secondary | ICD-10-CM

## 2018-03-29 DIAGNOSIS — Z7722 Contact with and (suspected) exposure to environmental tobacco smoke (acute) (chronic): Secondary | ICD-10-CM

## 2018-03-29 DIAGNOSIS — R0600 Dyspnea, unspecified: Secondary | ICD-10-CM

## 2018-03-29 DIAGNOSIS — M25611 Stiffness of right shoulder, not elsewhere classified: Secondary | ICD-10-CM

## 2018-03-29 DIAGNOSIS — M25511 Pain in right shoulder: Secondary | ICD-10-CM

## 2018-03-29 DIAGNOSIS — G8929 Other chronic pain: Secondary | ICD-10-CM

## 2018-03-29 DIAGNOSIS — R293 Abnormal posture: Secondary | ICD-10-CM

## 2018-03-29 DIAGNOSIS — M25612 Stiffness of left shoulder, not elsewhere classified: Secondary | ICD-10-CM

## 2018-03-29 DIAGNOSIS — M25552 Pain in left hip: Secondary | ICD-10-CM

## 2018-03-29 DIAGNOSIS — R0609 Other forms of dyspnea: Principal | ICD-10-CM

## 2018-03-29 DIAGNOSIS — M25512 Pain in left shoulder: Secondary | ICD-10-CM | POA: Diagnosis not present

## 2018-03-29 DIAGNOSIS — M25551 Pain in right hip: Secondary | ICD-10-CM

## 2018-03-29 DIAGNOSIS — M6281 Muscle weakness (generalized): Secondary | ICD-10-CM

## 2018-03-29 LAB — COMPREHENSIVE METABOLIC PANEL
ALBUMIN: 4 g/dL (ref 3.5–5.2)
ALK PHOS: 61 U/L (ref 39–117)
ALT: 12 U/L (ref 0–53)
AST: 14 U/L (ref 0–37)
BILIRUBIN TOTAL: 1.6 mg/dL — AB (ref 0.2–1.2)
BUN: 9 mg/dL (ref 6–23)
CO2: 33 mEq/L — ABNORMAL HIGH (ref 19–32)
CREATININE: 1.03 mg/dL (ref 0.40–1.50)
Calcium: 9.4 mg/dL (ref 8.4–10.5)
Chloride: 101 mEq/L (ref 96–112)
GFR: 90.7 mL/min (ref >60.00)
GLUCOSE: 97 mg/dL (ref 70–99)
Potassium: 4 mEq/L (ref 3.5–5.1)
Sodium: 139 mEq/L (ref 135–145)
TOTAL PROTEIN: 6.5 g/dL (ref 6.0–8.3)

## 2018-03-29 LAB — CBC WITH DIFFERENTIAL/PLATELET
BASOS ABS: 0 10*3/uL (ref 0.0–0.1)
BASOS PCT: 0.3 % (ref 0.0–3.0)
EOS ABS: 0.1 10*3/uL (ref 0.0–0.7)
Eosinophils Relative: 3.4 % (ref 0.0–5.0)
HEMATOCRIT: 41.7 % (ref 39.0–52.0)
Hemoglobin: 13.6 g/dL (ref 13.0–17.0)
LYMPHS ABS: 1.3 10*3/uL (ref 0.7–4.0)
Lymphocytes Relative: 35.3 % (ref 12.0–46.0)
MCHC: 32.7 g/dL (ref 30.0–36.0)
MCV: 86.2 fl (ref 78.0–100.0)
Monocytes Absolute: 0.5 10*3/uL (ref 0.1–1.0)
Monocytes Relative: 12.3 % — ABNORMAL HIGH (ref 3.0–12.0)
NEUTROS ABS: 1.8 10*3/uL (ref 1.4–7.7)
NEUTROS PCT: 48.7 % (ref 43.0–77.0)
PLATELETS: 302 10*3/uL (ref 150.0–400.0)
RBC: 4.83 Mil/uL (ref 4.22–5.81)
RDW: 14 % (ref 11.5–15.5)
WBC: 3.8 10*3/uL — ABNORMAL LOW (ref 4.0–10.5)

## 2018-03-29 LAB — BRAIN NATRIURETIC PEPTIDE: Pro B Natriuretic peptide (BNP): 14 pg/mL (ref 0.0–100.0)

## 2018-03-29 LAB — HEMOGLOBIN A1C: HEMOGLOBIN A1C: 5.9 % (ref 4.6–6.5)

## 2018-03-29 MED ORDER — LINACLOTIDE 145 MCG PO CAPS: 145.0000 ug | ORAL_CAPSULE | Freq: Every day | ORAL | 0 refills | Status: DC

## 2018-03-29 NOTE — Patient Instructions (Signed)
Your recent right lower quadrant pain with some features of spreading across umbilical to left lower quadrant area may be related to adhesion pain, IBS or constipation.  I do think it would be good to get CBC, CMP and a one view abdomen x-ray to evaluate the amount of stool present.  After review of the studies will decide on treatment.  Patient pain is scar tissue in the abdomen from prior surgeries and this is possible.   For history of hip pain, will review Dr. Augustin Coupe notes that he sends over.  For elevated blood sugar in the past, we will get A1c today.  For history of dyspnea on exertion, we did repeat EKG today and it shows no acute abnormality.  Very similar to last 2 EKGs done.  It does show slight bradycardia but your resting.  Otherwise sinus rhythm.  Some low voltage but that was also seen on prior EKGs. We will get chest x-ray and BNP.  If those studies are negative we will refer you to pulmonologist for pulmonary function test as you do have history of secondhand smoke exposure.  Follow-up in 10 to 14 days or as needed.

## 2018-03-29 NOTE — Telephone Encounter (Signed)
Rx linzess sent to pt pharmacy.

## 2018-03-29 NOTE — Progress Notes (Signed)
Subjective:    Patient ID: Brian [name], male    DOB: Apr 30, 1946, 72 y.o.   MRN: [id number]  HPI  Pt in with some rt lower side abdomen pain that seems to bother him after eating greasy foods x 2 weeks. He states pain seems to radiate and move to left lower abdomen. He states he does seem to have some difficulty having normal bowel movement. He is usually has to strain a lot to have a bm. Pt had hx of hernia repair, appendectomy and cholecystectomy. Pt states uses bathroom 3 times a day but has to strain hard to get stool out. No fever, no chills, no sweats, no no nausea, no vomiting or blood in his stools.  Pt had last colonoscopy with [facility]. Pt stats polyp found. He states next year will be due.  Pt in past had normal psa.   Mild elevated sugar in past. Pt states medicare advised him to get checked for diabetes.   Pt smoked only one year 1968-1969(pt states through his life he had a lot of second hand smoke exposure. Mom was 2 pack a day smoker. Recent exposure to other relative as well who finally quit). Pt does described he is feeling more sob recently/easier. But no wheezing. No chest pain. No arm pain. No swelling of legs. No described orthopnea. Prior ekg showed no significant acute abnormality. Feeling more short easier past 2 weeks. Pt state inhalers in past did not help.    Review of Systems  Constitutional: Negative for chills, fatigue and fever.  HENT: Negative for congestion and dental problem.   Respiratory: Positive for shortness of breath. Negative for chest tightness.        On exertion.  Cardiovascular: Negative for chest pain and palpitations.  Gastrointestinal: Positive for abdominal pain. Negative for abdominal distention, blood in stool, constipation, diarrhea and vomiting.  Genitourinary: Negative for dysuria.  Musculoskeletal: Negative for back pain and gait problem.  Skin: Negative for rash.  Neurological: Negative for dizziness, weakness, numbness and  headaches.  Hematological: Negative for adenopathy. Does not bruise/bleed easily.  Psychiatric/Behavioral: Negative for behavioral problems and confusion. The patient is not nervous/anxious.     Past Medical History:  Diagnosis Date  . Atypical chest pain     Negative cardiac catheterization 2014  . 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 Blind right eye   . 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 Chicken pox   . Chronic back pain   . Dislocation, jaw 1960s  . Edema    Left Leg  . Essential hypertension, benign   . GERD (gastroesophageal reflux disease)   . [place] measles   . Heart attack ([place])   . Hyperlipidemia   . Measles   . Mumps   . Osteopenia 11.12.10   bone density test  . Personal history of other diseases of digestive system 08.10.12   Gastric ulcer     Social History   Socioeconomic History  . Marital status: Single    Spouse name: Not on file  . Number of children: Not on file  . Years of education: Not on file  . Highest education level: Not on file  Occupational History  . Not on file  Social Needs  . Financial resource strain: Not on file  . Food insecurity:    Worry: Not on file    Inability: Not on file  . Transportation needs:    Medical: Not on file    Non-medical: Not  on file  Tobacco Use  . Smoking status: Former Smoker    Packs/day: 0.25    Years: 2.00    Pack years: 0.50    Types: Cigarettes    Last attempt to quit: 06/02/1966    Years since quitting: 51.8  . Smokeless tobacco: Never Used  Substance and Sexual Activity  . Alcohol use: No    Alcohol/week: 0.0 standard drinks  . Drug use: No  . Sexual activity: Never  Lifestyle  . Physical activity:    Days per week: Not on file    Minutes per session: Not on file  . Stress: Not on file  Relationships  . Social connections:    Talks on phone: Not on file    Gets together: Not on file    Attends religious service: Not on file    Active member of club or  organization: Not on file    Attends meetings of clubs or organizations: Not on file    Relationship status: Not on file  . Intimate partner violence:    Fear of current or ex partner: Not on file    Emotionally abused: Not on file    Physically abused: Not on file    Forced sexual activity: Not on file  Other Topics Concern  . Not on file  Social History Narrative  . Not on file    Past Surgical History:  Procedure Laterality Date  . ABDOMINAL HERNIA REPAIR  1972  . APPENDECTOMY  1973  . BACK SURGERY  10/28/2017  . CATARACT EXTRACTION, BILATERAL  2005  . CHOLECYSTECTOMY  08/2016  . ESOPHAGOGASTRODUODENOSCOPY  06.2011  . EYE SURGERY  1972   Bilateral Lens Replacement  . FACIAL RECONSTRUCTION SURGERY  2008  . LEFT HEART CATH  02.17.2014  . [place], 2008   Dislocated Jaw  . PARS PLANA VITRECTOMY W/ REPAIR OF MACULAR HOLE  2005   macular hole repair, right eye  . PARS PLANA VITRECTOMY W/ REPAIR OF MACULAR HOLE  2010   mechanical, left eye  . PROSTATE BIOPSY  [place] 2017   w/ Dr. Nevada Crane  . WISDOM TOOTH EXTRACTION      Family History  Problem Relation Age of Onset  . Alzheimer's disease Mother        Deceased  . Diabetes Maternal Grandmother   . Heart attack Maternal Grandmother   . Prostate cancer Maternal Uncle   . Diabetes Maternal Uncle   . Healthy Son   . Cataracts Maternal Aunt     Allergies  Allergen Reactions  . Gabapentin Other (See Comments)    Feel "weird"; Nausea; Weakness; Syncope    Current Outpatient Medications on File Prior to Visit  Medication Sig Dispense Refill  . aspirin (ASPIR-81) 81 MG EC tablet Take 1 tablet by mouth daily.    Marland Kitchen b complex vitamins tablet Take 1 tablet by mouth daily.    . benzonatate (TESSALON) 100 MG capsule Take 1 capsule (100 mg total) by mouth 3 (three) times daily as needed. 30 capsule 0  . Calcium Carbonate-Vitamin D (CALCIUM-VITAMIN D3 PO) Take by mouth daily.    . cyclobenzaprine (FLEXERIL) 5 MG  tablet Take 1 tablet (5 mg total) by mouth at bedtime. 7 tablet 0  . diclofenac (VOLTAREN) 75 MG EC tablet Take 1 tablet (75 mg total) by mouth 2 (two) times daily. 20 tablet 0  . hydrochlorothiazide (MICROZIDE) 12.5 MG capsule Take 1 capsule (12.5 mg total) by mouth daily. 90 capsule 3  .  levocetirizine (XYZAL) 5 MG tablet Take 1 tablet (5 mg total) by mouth every evening. 30 tablet 0  . methylPREDNISolone (MEDROL DOSEPAK) 4 MG TBPK tablet follow package directions    . mirabegron ER (MYRBETRIQ) 50 MG TB24 tablet Take 50 mg by mouth daily.    . ondansetron (ZOFRAN ODT) 8 MG disintegrating tablet Take 1 tablet (8 mg total) by mouth every 8 (eight) hours as needed for nausea or vomiting. 20 tablet 0  . pantoprazole (PROTONIX) 40 MG tablet Take 1 tablet (40 mg total) by mouth 2 (two) times daily. 180 tablet 3  . simvastatin (ZOCOR) 40 MG tablet Take 1 tablet (40 mg total) by mouth every evening. 90 tablet 3  . tamsulosin (FLOMAX) 0.4 MG CAPS capsule Take 2 capsules (0.8 mg total) by mouth daily. 90 capsule 1  . timolol (TIMOPTIC) 0.5 % ophthalmic solution Place 1 drop into the left eye daily. 10 mL 12  . traMADol (ULTRAM) 50 MG tablet 1 tab po q 8 hours prn pain 15 tablet 0  . triamcinolone ointment (KENALOG) 0.5 % Apply 1 application topically 2 (two) times daily. 30 g 0   No current facility-administered medications on file prior to visit.     BP 126/69   Pulse (!) 52   Temp (!) 97.5 F (36.4 C) (Oral)   Resp 16   Ht 5\' 4"  (1.626 m)   Wt 182 lb 9.6 oz (82.8 kg)   SpO2 100%   BMI 31.34 kg/m       Objective:   Physical Exam   General Mental Status- Alert. General Appearance- Not in acute distress.   Skin General: Color- Normal Color. Moisture- Normal Moisture.  Neck Carotid Arteries- Normal color. Moisture- Normal Moisture. No carotid bruits. No JVD.  Chest and Lung Exam Auscultation: Breath Sounds:-Normal.  Cardiovascular Auscultation:Rythm- Regular. Murmurs & Other  Heart Sounds:Auscultation of the heart reveals- No Murmurs.  Abdomen Inspection:-Inspeection Normal. Palpation/Percussion:Note:No mass. Palpation and Percussion of the abdomen reveal- Non Tender(mild lower abdomen tenderness to palpation), Non Distended + BS, no rebound or guarding.    Neurologic Cranial Nerve exam:- CN III-XII intact(No nystagmus), symmetric smile. Strength:- 5/5 equal and symmetric strength both upper and lower extremities.     Assessment & Plan:  Your recent right lower quadrant pain with some features of spreading across umbilical to left lower quadrant area may be related to adhesion pain, IBS or constipation.  I do think it would be good to get CBC, CMP and a one view abdomen x-ray to evaluate the amount of stool present.  After review of the studies will decide on treatment.  Patient pain is scar tissue in the abdomen from prior surgeries and this is possible.   For history of hip pain, will review Dr. Augustin Coupe notes that he sends over.  For elevated blood sugar in the past, we will get A1c today.  For history of dyspnea on exertion, we did repeat EKG today and it shows no acute abnormality.  Very similar to last 2 EKGs done.  It does show slight bradycardia but your resting.  Otherwise sinus rhythm.  Some low voltage but that was also seen on prior EKGs. We will get chest x-ray and BNP.  If those studies are negative we will refer you to pulmonologist for pulmonary function test as you do have history of secondhand smoke exposure.  Follow-up in 10 to 14 days or as needed.  Mackie Pai, PA-C

## 2018-03-29 NOTE — Therapy (Signed)
[facility] [address]  [place], [place], [address] Phone: [phone]   Fax:  [phone]  Physical Therapy Treatment  Patient Details  Name: Brian [name] MRN: [id number] Date of Birth: 1946/04/20 Referring Provider (PT): Chuck Hint, MD   Encounter Date: 03/29/2018  PT End of Session - 03/29/18 0943    Visit Number  2    Number of Visits  10    Date for PT Re-Evaluation  04/28/18    Authorization Type  Medicare [employer] & [employer]    PT Start Time  0930    PT Stop Time  1015    PT Time Calculation (min)  45 min    Activity Tolerance  Patient tolerated treatment well    Behavior During Therapy  [facility] for tasks assessed/performed       Past Medical History:  Diagnosis Date  . Atypical chest pain     Negative cardiac catheterization 2014  . 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 Blind right eye   . 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 Chicken pox   . Chronic back pain   . Dislocation, jaw 1960s  . Edema    Left Leg  . Essential hypertension, benign   . GERD (gastroesophageal reflux disease)   . [place] measles   . Heart attack ([place])   . Hyperlipidemia   . Measles   . Mumps   . Osteopenia 11.12.10   bone density test  . Personal history of other diseases of digestive system 08.10.12   Gastric ulcer    Past Surgical History:  Procedure Laterality Date  . ABDOMINAL HERNIA REPAIR  1972  . APPENDECTOMY  1973  . BACK SURGERY  10/28/2017  . CATARACT EXTRACTION, BILATERAL  2005  . CHOLECYSTECTOMY  08/2016  . ESOPHAGOGASTRODUODENOSCOPY  06.2011  . EYE SURGERY  1972   Bilateral Lens Replacement  . FACIAL RECONSTRUCTION SURGERY  2008  . LEFT HEART CATH  02.17.2014  . [place], 2008   Dislocated Jaw  . PARS PLANA VITRECTOMY W/ REPAIR OF MACULAR HOLE  2005   macular hole repair, right eye  . PARS PLANA VITRECTOMY W/ REPAIR OF MACULAR HOLE  2010   mechanical, left  eye  . PROSTATE BIOPSY  [place] 2017   w/ Dr. [place] Crane  . WISDOM TOOTH EXTRACTION      There were no vitals filed for this visit.  Subjective Assessment - 03/29/18 0940    Subjective  Pt. noting he walked on a slope this morning for ~ 1/2 mile which he feels made his back pain worse.      Pertinent History  Lumbar fusion - 10/28/17    Diagnostic tests  03/04/18 - [facility] u/s Left shoulder: Biceps tendon intact on long and trans views. Focal tenosynovitis anteriorly. AC joint with mod arthropathy. Subscapularis and Infraspinatus normal without tears. Mod subacromial bursitis. Supraspinatus with focal cortical irregularity and small insertional tear that on trans view appears near full thickness but only 38m in width.  [facility] u/s Right shoulder: Biceps tendon intact on long and trans views. Focal tenosynovitis anteriorly. AC joint with mod arthropathy. Subscapularis and Infraspinatus normal without tears. Mod subacromial bursitis. Supraspinatus with two interstitial tears about 4 and 369min width.    Patient Stated Goals  "to stop having any pain"    Currently in Pain?  Yes    Pain Score  4     Pain Location  Back    Pain Orientation  Left    Pain Descriptors / Indicators  Aching    Pain Type  Chronic pain    Pain Radiating Towards  into both hips     Pain Onset  More than a month ago    Pain Frequency  Intermittent    Aggravating Factors   walking on sloped surfaces     Pain Relieving Factors  nothing     Multiple Pain Sites  No                       [facility] Adult PT Treatment/Exercise - 03/29/18 0951      Lumbar Exercises: Aerobic   Nustep  Lvl 5, 7 min (LE/UE)       Shoulder Exercises: Seated   Retraction  Both;15 reps    Retraction Limitations  Heavy cueing for full scapular retraction and avoiding excessive scap. elevation       Shoulder Exercises: Standing   External Rotation  Right;Left;10 reps;Strengthening    Theraband Level (Shoulder External Rotation)  Level  1 (Yellow)    External Rotation Limitations  isometric step-outs     Internal Rotation  Right;Left;10 reps;Strengthening    Internal Rotation Limitations  isometric step outs     Extension  Both;10 reps;Strengthening    Theraband Level (Shoulder Extension)  Level 2 (Red)    Extension Limitations  3" hold; cues for scapular retraction required     Row  Both;15 reps    Theraband Level (Shoulder Row)  Level 2 (Red)    Row Limitations  Cues for full scapular retraction       Shoulder Exercises: IT sales professional  30 seconds;2 reps    Corner Stretch Limitations  Cues to avoid excessive scap. elevation     Other Shoulder Stretches  B UT stretch x 30 sec     Other Shoulder Stretches  B LS stretch x 30 sec each       Manual Therapy   Manual Therapy  Passive ROM    Manual therapy comments  supine     Passive ROM  Gentle B shoulder PROM to tolerance                PT Short Term Goals - 03/29/18 0943      PT SHORT TERM GOAL #1   Title  Pt will be independent with initial HEP    Status  Achieved      PT SHORT TERM GOAL #2   Title  Pt will verbalize/demonstrate understanding of neutral shoulder posture to reduce risk of impingement with overhead motion    Status  Achieved        PT Long Term Goals - 03/29/18 0943      PT LONG TERM GOAL #1   Title  Pt will be independent with advanced/ongoing HEP    Status  On-going      PT LONG TERM GOAL #2   Title  B shoulder ROM WFL w/o increased pain to increase ease of ADLs    Status  On-going      PT LONG TERM GOAL #3   Title  B shoulder strength >/= 4/5 to 4+/5 for improved function    Status  On-going      PT LONG TERM GOAL #4   Title  Pt will report >/= 50% reduction in B shoulder pain    Status  On-going      PT LONG  TERM GOAL #5   Title  Pt will report >/= 50% improvement in ability to complete ADLs and basic household chores w/o limitation due to B shoulder pain, LOM or weakness    Status  On-going             Plan - 03/29/18 1005    Clinical Impression Statement  Maricela requiring cueing with HEP review for proper positioning with doorway chest stretch and to encourage proper scapular motion with scap. retraction HEP activity.  Tolerated progression of scapular strengthening and anterior chest stretching activities well today.  Initiated gentle band resisted isometric RTC step-outs today without issue.  Ended visit pain free thus modalities deferred.  Discussed importance of proper retracted posture with sitting and standing positioning as to improve long-term shoulder mechanics with pt. verbalizing understanding.  STG's now met.      Clinical Impairments Affecting Rehab Potential  Hearing difficulty, R eye visual deficits     PT Treatment/Interventions  Patient/family education;ADLs/Self Care Home Management;Neuromuscular re-education;Therapeutic exercise;Therapeutic activities;Electrical Stimulation;Moist Heat;Cryotherapy;Iontophoresis 75m/ml Dexamethasone;Ultrasound;Manual techniques;Dry needling;Passive range of motion;Taping    Consulted and Agree with Plan of Care  Patient       Patient will benefit from skilled therapeutic intervention in order to improve the following deficits and impairments:  Pain, Postural dysfunction, Improper body mechanics, Decreased range of motion, Decreased strength, Impaired UE functional use, Decreased activity tolerance, Increased muscle spasms, Impaired flexibility  Visit Diagnosis: Chronic left shoulder pain  Chronic right shoulder pain  Abnormal posture  Stiffness of left shoulder, not elsewhere classified  Stiffness of right shoulder, not elsewhere classified  Muscle weakness (generalized)     Problem List Patient Active Problem List   Diagnosis Date Noted  . Right shoulder pain 01/30/2017  . Left shoulder pain 05/19/2016  . Urinary hesitancy 10/18/2014  . Trapezius muscle spasm 10/18/2014  . Idiopathic neuropathy 10/18/2014  .  Allergic reaction 09/28/2014  . Hyperglycemia 09/28/2014  . Acute bacterial bronchitis 08/18/2014  . Hematuria 08/18/2014  . Screening, ischemic heart disease 06/16/2014  . Lumbar radiculopathy, chronic 05/19/2014  . Midline thoracic back pain 03/24/2014  . BPPV (benign paroxysmal positional vertigo) 01/20/2014  . Right-sided low back pain with right-sided sciatica 12/21/2013  . Lightheadedness 09/22/2013  . Porokeratosis 02/25/2013  . Pain in joint, ankle and foot 02/25/2013  . Lumbosacral pain 02/15/2013  . Abdominal pain, epigastric 02/15/2013  . Acid reflux 02/15/2013  . Hearing loss 02/15/2013  . Preventive measure 02/15/2013  . Chest pain 07/19/2012  . Bradycardia, sinus 07/19/2012  . Borderline glaucoma 03/15/2012  . 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 Dislocated inferior maxilla 01/10/2011  . H/O gastric ulcer 01/10/2011  . Back pain, chronic 11/21/2010    MBess Harvest PTA 03/29/18 1:01 PM   [facility] [address] [place] [address]Phone: [phone]  Fax:  [phone] Name: Brian MINEHARTMRN: [id number] of Birth: [birth year]

## 2018-03-31 ENCOUNTER — Ambulatory Visit: Payer: MEDICARE

## 2018-03-31 ENCOUNTER — Telehealth: Payer: Self-pay | Admitting: Medical

## 2018-03-31 DIAGNOSIS — M25612 Stiffness of left shoulder, not elsewhere classified: Secondary | ICD-10-CM

## 2018-03-31 DIAGNOSIS — M25611 Stiffness of right shoulder, not elsewhere classified: Secondary | ICD-10-CM

## 2018-03-31 DIAGNOSIS — M6281 Muscle weakness (generalized): Secondary | ICD-10-CM

## 2018-03-31 DIAGNOSIS — R293 Abnormal posture: Secondary | ICD-10-CM

## 2018-03-31 DIAGNOSIS — M25511 Pain in right shoulder: Secondary | ICD-10-CM

## 2018-03-31 DIAGNOSIS — M25512 Pain in left shoulder: Secondary | ICD-10-CM | POA: Diagnosis not present

## 2018-03-31 DIAGNOSIS — R1011 Right upper quadrant pain: Secondary | ICD-10-CM

## 2018-03-31 DIAGNOSIS — G8929 Other chronic pain: Secondary | ICD-10-CM

## 2018-03-31 NOTE — Therapy (Signed)
[facility] [address]  [place], [place], [address] Phone: [phone]   Fax:  [phone]  Physical Therapy Treatment  Patient Details  Name: Brian [name] MRN: [id number] Date of Birth: Dec 15, 1945 Referring Provider (PT): Chuck Hint, MD   Encounter Date: 03/31/2018  PT End of Session - 03/31/18 0940    Visit Number  3    Number of Visits  10    Date for PT Re-Evaluation  04/28/18    Authorization Type  Medicare [employer] & [employer]    PT Start Time  [phone]    PT Stop Time  1011    PT Time Calculation (min)  38 min    Activity Tolerance  Patient tolerated treatment well    Behavior During Therapy  [facility] for tasks assessed/performed       Past Medical History:  Diagnosis Date  . Atypical chest pain     Negative cardiac catheterization 2014  . 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 Blind right eye   . 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 Chicken pox   . Chronic back pain   . Dislocation, jaw 1960s  . Edema    Left Leg  . Essential hypertension, benign   . GERD (gastroesophageal reflux disease)   . [place] measles   . Heart attack ([place])   . Hyperlipidemia   . Measles   . Mumps   . Osteopenia 11.12.10   bone density test  . Personal history of other diseases of digestive system 08.10.12   Gastric ulcer    Past Surgical History:  Procedure Laterality Date  . ABDOMINAL HERNIA REPAIR  1972  . APPENDECTOMY  1973  . BACK SURGERY  10/28/2017  . CATARACT EXTRACTION, BILATERAL  2005  . CHOLECYSTECTOMY  08/2016  . ESOPHAGOGASTRODUODENOSCOPY  06.2011  . EYE SURGERY  1972   Bilateral Lens Replacement  . FACIAL RECONSTRUCTION SURGERY  2008  . LEFT HEART CATH  02.17.2014  . [place], 2008   Dislocated Jaw  . PARS PLANA VITRECTOMY W/ REPAIR OF MACULAR HOLE  2005   macular hole repair, right eye  . PARS PLANA VITRECTOMY W/ REPAIR OF MACULAR HOLE  2010   mechanical, left  eye  . PROSTATE BIOPSY  [place] 2017   w/ Dr. [place] Crane  . WISDOM TOOTH EXTRACTION      There were no vitals filed for this visit.  Subjective Assessment - 03/31/18 0937    Subjective  Pt. reporting, "this is not a good day", I don't think the courtisone shot i got in my hip yesterday is working".     Pertinent History  Lumbar fusion - 10/28/17    Diagnostic tests  03/04/18 - [facility] u/s Left shoulder: Biceps tendon intact on long and trans views. Focal tenosynovitis anteriorly. AC joint with mod arthropathy. Subscapularis and Infraspinatus normal without tears. Mod subacromial bursitis. Supraspinatus with focal cortical irregularity and small insertional tear that on trans view appears near full thickness but only 57mm in width.  [facility] u/s Right shoulder: Biceps tendon intact on long and trans views. Focal tenosynovitis anteriorly. AC joint with mod arthropathy. Subscapularis and Infraspinatus normal without tears. Mod subacromial bursitis. Supraspinatus with two interstitial tears about 4 and 86mm in width.    Patient Stated Goals  "to stop having any pain"    Currently in Pain?  Yes    Pain Score  5     Pain Location  Back    Pain Orientation  Left    Pain Descriptors / Indicators  Aching    Pain Type  Chronic pain    Pain Radiating Towards  into both hips     Pain Onset  More than a month ago    Pain Frequency  Intermittent    Pain Relieving Factors  nothing    Multiple Pain Sites  Yes    Pain Score  6    Pain Location  Shoulder    Pain Orientation  Right    Pain Descriptors / Indicators  Aching;Burning    Pain Type  Chronic pain    Pain Radiating Towards  into R biceps     Pain Onset  More than a month ago    Aggravating Factors   unsure                        [facility] Adult PT Treatment/Exercise - 03/31/18 0950      Shoulder Exercises: Supine   External Rotation  Right;Left;10 reps;AAROM    External Rotation Limitations  wand; cueing required for proper motion      Flexion  Both;10 reps;AAROM    Flexion Limitations  wand       Shoulder Exercises: Standing   External Rotation  Right;Left;15 reps    Theraband Level (Shoulder External Rotation)  Level 1 (Yellow)    External Rotation Limitations  isometric step-outs     Internal Rotation  Right;Left;15 reps;Theraband    Internal Rotation Limitations  isometric step outs     Extension  15 reps;Both    Theraband Level (Shoulder Extension)  Level 2 (Red)    Extension Limitations  3" hold; cues for scapular retraction required     Row  Both;15 reps    Theraband Level (Shoulder Row)  Level 2 (Red)    Row Limitations  Cues for full scapular retraction     Other Standing Exercises  B shoulder IR/ER with yellow TB x 10 reps each way   well tolerated; cueing required for overall technique      Shoulder Exercises: ROM/Strengthening   UBE (Upper Arm Bike)  Lvl 1.0, 3 min forwards/back      Shoulder Exercises: Stretch   Corner Stretch  30 seconds;2 reps    Corner Stretch Limitations  Cues to avoid excessive scap. elevation              PT Education - 03/31/18 1306    Education Details  HEP update; red TB issued ot pt.     Person(s) Educated  Patient    Methods  Explanation;Demonstration;Verbal cues;Handout    Comprehension  Verbalized understanding;Returned demonstration;Verbal cues required;Need further instruction       PT Short Term Goals - 03/29/18 0943      PT SHORT TERM GOAL #1   Title  Pt will be independent with initial HEP    Status  Achieved      PT SHORT TERM GOAL #2   Title  Pt will verbalize/demonstrate understanding of neutral shoulder posture to reduce risk of impingement with overhead motion    Status  Achieved        PT Long Term Goals - 03/29/18 0943      PT LONG TERM GOAL #1   Title  Pt will be independent with advanced/ongoing HEP    Status  On-going      PT LONG TERM GOAL #2   Title  B shoulder ROM [facility]  w/o increased pain to increase ease of ADLs    Status   On-going      PT LONG TERM GOAL #3   Title  B shoulder strength >/= 4/5 to 4+/5 for improved function    Status  On-going      PT LONG TERM GOAL #4   Title  Pt will report >/= 50% reduction in B shoulder pain    Status  On-going      PT LONG TERM GOAL #5   Title  Pt will report >/= 50% improvement in ability to complete ADLs and basic household chores w/o limitation due to B shoulder pain, LOM or weakness    Status  On-going            Plan - 03/31/18 0940    Clinical Impression Statement  Pt. reports he feels he tolerated last session well.  Session today focused on demonstration and update of HEP.  Pt. tolerated row and extension row with band well today thus updated HEP with these.  Still somewhat limited with PROM flexion, ER thus wand AAROM added to HEP.  Pt. tolerated all activities in session well today.  Ended visit pain free thus modalities deferred.  Able to progress to yellow TB resisted ER/IR today and will plan to monitor tolerance and hopefully updated HEP next visit.      Clinical Impairments Affecting Rehab Potential  Hearing difficulty, R eye visual deficits     PT Treatment/Interventions  Patient/family education;ADLs/Self Care Home Management;Neuromuscular re-education;Therapeutic exercise;Therapeutic activities;Electrical Stimulation;Moist Heat;Cryotherapy;Iontophoresis 4mg /ml Dexamethasone;Ultrasound;Manual techniques;Dry needling;Passive range of motion;Taping    Consulted and Agree with Plan of Care  Patient       Patient will benefit from skilled therapeutic intervention in order to improve the following deficits and impairments:  Pain, Postural dysfunction, Improper body mechanics, Decreased range of motion, Decreased strength, Impaired UE functional use, Decreased activity tolerance, Increased muscle spasms, Impaired flexibility  Visit Diagnosis: Chronic left shoulder pain  Chronic right shoulder pain  Abnormal posture  Stiffness of left shoulder, not  elsewhere classified  Stiffness of right shoulder, not elsewhere classified  Muscle weakness (generalized)     Problem List Patient Active Problem List   Diagnosis Date Noted  . Right shoulder pain 01/30/2017  . Left shoulder pain 05/19/2016  . Urinary hesitancy 10/18/2014  . Trapezius muscle spasm 10/18/2014  . Idiopathic neuropathy 10/18/2014  . Allergic reaction 09/28/2014  . Hyperglycemia 09/28/2014  . Acute bacterial bronchitis 08/18/2014  . Hematuria 08/18/2014  . Screening, ischemic heart disease 06/16/2014  . Lumbar radiculopathy, chronic 05/19/2014  . Midline thoracic back pain 03/24/2014  . BPPV (benign paroxysmal positional vertigo) 01/20/2014  . Right-sided low back pain with right-sided sciatica 12/21/2013  . Lightheadedness 09/22/2013  . Porokeratosis 02/25/2013  . Pain in joint, ankle and foot 02/25/2013  . Lumbosacral pain 02/15/2013  . Abdominal pain, epigastric 02/15/2013  . Acid reflux 02/15/2013  . Hearing loss 02/15/2013  . Preventive measure 02/15/2013  . Chest pain 07/19/2012  . Bradycardia, sinus 07/19/2012  . Borderline glaucoma 03/15/2012  . 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 Dislocated inferior maxilla 01/10/2011  . H/O gastric ulcer 01/10/2011  . Back pain, chronic 11/21/2010    Bess Harvest, PTA 03/31/18 1:06 PM   [facility] [address]  [place], [place], [address] Phone: [phone]   Fax:  [phone]  Name: PEIRCE DEVENEY MRN: [id number] Date of Birth: 10-09-1945

## 2018-03-31 NOTE — Telephone Encounter (Signed)
Referral to gi placed. °

## 2018-04-01 ENCOUNTER — Ambulatory Visit (INDEPENDENT_AMBULATORY_CARE_PROVIDER_SITE_OTHER): Payer: MEDICARE | Admitting: Pulmonary Disease

## 2018-04-01 ENCOUNTER — Encounter: Payer: Self-pay | Admitting: Pulmonary Disease

## 2018-04-01 VITALS — BP 102/74 | HR 57 | Ht 64.0 in | Wt 183.0 lb

## 2018-04-01 DIAGNOSIS — R0683 Snoring: Secondary | ICD-10-CM | POA: Diagnosis not present

## 2018-04-01 DIAGNOSIS — J849 Interstitial pulmonary disease, unspecified: Secondary | ICD-10-CM

## 2018-04-01 DIAGNOSIS — K22719 Barrett's esophagus with dysplasia, unspecified: Secondary | ICD-10-CM | POA: Diagnosis not present

## 2018-04-01 NOTE — Progress Notes (Signed)
   Subjective:    Patient ID: Brian [name], male    DOB: 08/13/1945, 72 y.o.   MRN: [id number]  HPI    Review of Systems  Constitutional: Negative for fever and unexpected weight change.  HENT: Positive for postnasal drip. Negative for congestion, dental problem, ear pain, nosebleeds, rhinorrhea, sinus pressure, sneezing, sore throat and trouble swallowing.   Eyes: Negative for redness and itching.  Respiratory: Positive for cough and shortness of breath. Negative for chest tightness and wheezing.   Cardiovascular: Negative for palpitations and leg swelling.  Gastrointestinal: Negative for nausea and vomiting.  Genitourinary: Negative for dysuria.  Musculoskeletal: Negative for joint swelling.  Skin: Negative for rash.  Allergic/Immunologic: Positive for environmental allergies. Negative for food allergies and immunocompromised state.  Neurological: Positive for headaches.  Hematological: Does not bruise/bleed easily.  Psychiatric/Behavioral: Negative for dysphoric mood. The patient is not nervous/anxious.        Objective:   Physical Exam        Assessment & Plan:

## 2018-04-01 NOTE — Patient Instructions (Signed)
Will arrange for high resolution CT chest, pulmonary function test and home sleep study  Follow up in 2 to 3 weeks with Dr. Halford Chessman or Nurse Practitioner

## 2018-04-01 NOTE — Progress Notes (Signed)
[facility]  Chief Complaint  Patient presents with  . pulm consult    Pt has SOB and dry cough for last 4 week, no better. Pt referred by PCP.     Constitutional:  BP 102/74 (BP Location: Left Arm, Cuff Size: Normal)   Pulse (!) 57   Ht 5\' 4"  (1.626 m)   Wt 183 lb (83 kg)   SpO2 96%   BMI 31.41 kg/m   Past Medical History:  GERD, Barrett's Esophagus, HLD, Macular degeneration, Glaucoma, Sleep apnea, BPH, Overactive bladder, Vertigo, PUD, HTN, Neuropathy, Back pain, CAD  Brief Summary:  Brian [name] is a 72 y.o. male former smoker with cough.  He has noticed cough for years.  He was seen by pulmonary in 2015 and 2017.  He also gets short of breath with activities if he is walking up stairs.  He does okay on level ground.  He sometimes brings up sputum, but usually cough is dry.  Not having much sinus congestion.  No history of asthma, or allergies.  Had pneumonia years ago.  No history of TB.  From [place].  Has lived in [place] for about 5 yrs.  Quit smoking years ago.  No animal/bird exposures.  He snores and has trouble breathing at night.  He gets very sleepy during the day.  Epworth score is 14 out of 24.  CXR 03/29/18 (reviewed by me) >> increased interstitial markings.  Physical Exam:   Appearance - well kempt   ENMT - clear nasal mucosa, midline nasal  septum, no oral exudates, no LAN, trachea midline, MP 3, wears dentures  Respiratory - normal chest wall, normal respiratory effort, no accessory muscle use, faint crackles at bases b/l  CV - s1s2 regular rate and rhythm, no murmurs, no peripheral edema, radial pulses symmetric  GI - soft, non tender, no masses  Lymph - no adenopathy noted in neck and axillary areas  MSK - normal gait  Ext - no cyanosis, clubbing, or joint inflammation noted  Skin - no rashes, lesions, or ulcers  Neuro - normal strength, oriented x 3  Psych - normal mood and affect  Labs 03/29/18 >> WBC  2.8, Hb 13.6, BNP 14, CO2 33, Bili 1.6, creatinine 1.03  Discussion:  He has persistent cough, and dyspnea over past several years.  He has history of reflux.  His CXR shows subtle changes of increased interstitial markings.  I am concerned he might of interstitial lung disease.  He has snoring, sleep disruption, apnea, and daytime sleepiness.  He has hx of hypertension.  I am concerned he could have sleep apnea.  Assessment/Plan:   Cough, dyspnea with concern for ILD. - will arrange for HRCT chest and PFT  Snoring. - will arrange for home sleep study  GERD with hx of Barrett's Esophagus. - followed by GI in [place] - explained how sleep apnea can impact his reflux   Patient Instructions  Will arrange for high resolution CT chest, pulmonary function test and home sleep study  Follow up in 2 to 3 weeks with Dr. Halford Chessman or Nurse Practitioner    Chesley Mires, MD [place] Pager: [phone] 04/01/2018, 9:57 AM  Flow Sheet     Pulmonary tests:    Sleep tests:    Cardiac tests:  LHC 07/19/12 >> non significant CAD, EF 60% Echo 11/27/15 >> normal EF   Review of Systems:  Constitutional: Negative for fever and unexpected weight change.  HENT: Positive  for postnasal drip. Negative for congestion, dental problem, ear pain, nosebleeds, rhinorrhea, sinus pressure, sneezing, sore throat and trouble swallowing.   Eyes: Negative for redness and itching.  Respiratory: Positive for cough and shortness of breath. Negative for chest tightness and wheezing.   Cardiovascular: Negative for palpitations and leg swelling.  Gastrointestinal: Negative for nausea and vomiting.  Genitourinary: Negative for dysuria.  Musculoskeletal: Negative for joint swelling.  Skin: Negative for rash.  Allergic/Immunologic: Positive for environmental allergies. Negative for food allergies and immunocompromised state.  Neurological: Positive for headaches.  Hematological: Does not  bruise/bleed easily.  Psychiatric/Behavioral: Negative for dysphoric mood. The patient is not nervous/anxious.    Medications:   Allergies as of 04/01/2018      Reactions   Gabapentin Other (See Comments)   Feel "weird"; Nausea; Weakness; Syncope      Medication List        Accurate as of 04/01/18  9:57 AM. Always use your most recent med list.          ASPIRIN 81 81 MG EC tablet Generic drug:  aspirin Take 1 tablet by mouth daily.   b complex vitamins tablet Take 1 tablet by mouth daily.   benzonatate 100 MG capsule Commonly known as:  TESSALON Take 1 capsule (100 mg total) by mouth 3 (three) times daily as needed.   CALCIUM-VITAMIN D3 PO Take by mouth daily.   cyclobenzaprine 5 MG tablet Commonly known as:  FLEXERIL Take 1 tablet (5 mg total) by mouth at bedtime.   diclofenac 75 MG EC tablet Commonly known as:  VOLTAREN Take 1 tablet (75 mg total) by mouth 2 (two) times daily.   hydrochlorothiazide 12.5 MG capsule Commonly known as:  MICROZIDE Take 1 capsule (12.5 mg total) by mouth daily.   levocetirizine 5 MG tablet Commonly known as:  XYZAL Take 1 tablet (5 mg total) by mouth every evening.   linaclotide 145 MCG Caps capsule Commonly known as:  LINZESS Take 1 capsule (145 mcg total) by mouth daily before breakfast.   methylPREDNISolone 4 MG Tbpk tablet Commonly known as:  MEDROL DOSEPAK follow package directions   mirabegron ER 50 MG Tb24 tablet Commonly known as:  MYRBETRIQ Take 50 mg by mouth daily.   ondansetron 8 MG disintegrating tablet Commonly known as:  ZOFRAN-ODT Take 1 tablet (8 mg total) by mouth every 8 (eight) hours as needed for nausea or vomiting.   pantoprazole 40 MG tablet Commonly known as:  PROTONIX Take 1 tablet (40 mg total) by mouth 2 (two) times daily.   simvastatin 40 MG tablet Commonly known as:  ZOCOR Take 1 tablet (40 mg total) by mouth every evening.   tamsulosin 0.4 MG Caps capsule Commonly known as:   FLOMAX Take 2 capsules (0.8 mg total) by mouth daily.   timolol 0.5 % ophthalmic solution Commonly known as:  TIMOPTIC Place 1 drop into the left eye daily.   traMADol 50 MG tablet Commonly known as:  ULTRAM 1 tab po q 8 hours prn pain   triamcinolone ointment 0.5 % Commonly known as:  KENALOG Apply 1 application topically 2 (two) times daily.       Past Surgical History:  He  has a past surgical history that includes Appendectomy (1973); Abdominal hernia repair (1972); Eye surgery (1972); Mandible surgery (1968, 2008); Wisdom tooth extraction; Cataract extraction, bilateral (2005); Pars plana vitrectomy w/ repair of macular hole (2005); Pars plana vitrectomy w/ repair of macular hole (2010); Esophagogastroduodenoscopy (82.4235); Facial reconstruction surgery (2008);  Left heart cath (02.17.2014); Prostate biopsy [place] 2017); Cholecystectomy (08/2016); and Back surgery (10/28/2017).  Family History:  His family history includes Alzheimer's disease in his mother; Cataracts in his maternal aunt; Diabetes in his maternal grandmother and maternal uncle; Healthy in his son; Heart attack in his maternal grandmother; Prostate cancer in his maternal uncle.  Social History:  He  reports that he quit smoking about 51 years ago. His smoking use included cigarettes. He has a 0.50 pack-year smoking history. He has never used smokeless tobacco. He reports that he does not drink alcohol or use drugs.

## 2018-04-02 ENCOUNTER — Telehealth: Payer: Self-pay | Admitting: Gastroenterology

## 2018-04-02 NOTE — Telephone Encounter (Signed)
NP Can schedule in my clinic-routine appointment please

## 2018-04-05 ENCOUNTER — Ambulatory Visit: Payer: MEDICARE | Attending: Family Medicine

## 2018-04-05 ENCOUNTER — Ambulatory Visit (HOSPITAL_BASED_OUTPATIENT_CLINIC_OR_DEPARTMENT_OTHER)
Admission: RE | Admit: 2018-04-05 | Discharge: 2018-04-05 | Disposition: A | Discharge status: Home / Self Care | Payer: MEDICARE | Source: Ambulatory Visit | Attending: Pulmonary Disease | Admitting: Pulmonary Disease

## 2018-04-05 DIAGNOSIS — M25611 Stiffness of right shoulder, not elsewhere classified: Secondary | ICD-10-CM | POA: Insufficient documentation

## 2018-04-05 DIAGNOSIS — J849 Interstitial pulmonary disease, unspecified: Secondary | ICD-10-CM | POA: Diagnosis not present

## 2018-04-05 DIAGNOSIS — K449 Diaphragmatic hernia without obstruction or gangrene: Secondary | ICD-10-CM | POA: Diagnosis not present

## 2018-04-05 DIAGNOSIS — M25612 Stiffness of left shoulder, not elsewhere classified: Secondary | ICD-10-CM | POA: Diagnosis present

## 2018-04-05 DIAGNOSIS — I2510 Atherosclerotic heart disease of native coronary artery without angina pectoris: Secondary | ICD-10-CM | POA: Diagnosis not present

## 2018-04-05 DIAGNOSIS — M25511 Pain in right shoulder: Secondary | ICD-10-CM | POA: Insufficient documentation

## 2018-04-05 DIAGNOSIS — R293 Abnormal posture: Secondary | ICD-10-CM | POA: Diagnosis present

## 2018-04-05 DIAGNOSIS — M6281 Muscle weakness (generalized): Secondary | ICD-10-CM

## 2018-04-05 DIAGNOSIS — M25512 Pain in left shoulder: Secondary | ICD-10-CM | POA: Insufficient documentation

## 2018-04-05 DIAGNOSIS — G8929 Other chronic pain: Secondary | ICD-10-CM | POA: Diagnosis present

## 2018-04-05 NOTE — Therapy (Signed)
[facility] [address]  [place], [place], [address] Phone: [phone]   Fax:  [phone]  Physical Therapy Treatment  Patient Details  Name: Brian [name] MRN: [id number] Date of Birth: Jan 03, 1946 Referring Provider (PT): Chuck Hint, MD   Encounter Date: 04/05/2018  PT End of Session - 04/05/18 0954    Visit Number  4    Number of Visits  10    Date for PT Re-Evaluation  04/28/18    Authorization Type  Medicare [employer] & [employer]    PT Start Time  [id number]    PT Stop Time  1014    PT Time Calculation (min)  48 min    Activity Tolerance  Patient tolerated treatment well    Behavior During Therapy  [facility] for tasks assessed/performed       Past Medical History:  Diagnosis Date  . Atypical chest pain     Negative cardiac catheterization 2014  . 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 Blind right eye   . 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 Chicken pox   . Chronic back pain   . Dislocation, jaw 1960s  . Edema    Left Leg  . Essential hypertension, benign   . GERD (gastroesophageal reflux disease)   . [place] measles   . Heart attack ([place])   . Hyperlipidemia   . Measles   . Mumps   . Osteopenia 11.12.10   bone density test  . Personal history of other diseases of digestive system 08.10.12   Gastric ulcer    Past Surgical History:  Procedure Laterality Date  . ABDOMINAL HERNIA REPAIR  1972  . APPENDECTOMY  1973  . BACK SURGERY  10/28/2017  . CATARACT EXTRACTION, BILATERAL  2005  . CHOLECYSTECTOMY  08/2016  . ESOPHAGOGASTRODUODENOSCOPY  06.2011  . EYE SURGERY  1972   Bilateral Lens Replacement  . FACIAL RECONSTRUCTION SURGERY  2008  . LEFT HEART CATH  02.17.2014  . [place], 2008   Dislocated Jaw  . PARS PLANA VITRECTOMY W/ REPAIR OF MACULAR HOLE  2005   macular hole repair, right eye  . PARS PLANA VITRECTOMY W/ REPAIR OF MACULAR HOLE  2010   mechanical, left  eye  . PROSTATE BIOPSY  [place] 2017   w/ Dr. Nevada Crane  . WISDOM TOOTH EXTRACTION      There were no vitals filed for this visit.  Subjective Assessment - 04/05/18 0928    Subjective  Pt. noting he does not feel cortisone shot is helping his hip.  Does feel that reaching tolerance overhead has improved.      Pertinent History  Lumbar fusion - 10/28/17    Diagnostic tests  03/04/18 - [facility] u/s Left shoulder: Biceps tendon intact on long and trans views. Focal tenosynovitis anteriorly. AC joint with mod arthropathy. Subscapularis and Infraspinatus normal without tears. Mod subacromial bursitis. Supraspinatus with focal cortical irregularity and small insertional tear that on trans view appears near full thickness but only 66mm in width.  [facility] u/s Right shoulder: Biceps tendon intact on long and trans views. Focal tenosynovitis anteriorly. AC joint with mod arthropathy. Subscapularis and Infraspinatus normal without tears. Mod subacromial bursitis. Supraspinatus with two interstitial tears about 4 and 11mm in width.    Patient Stated Goals  "to stop having any pain"    Currently in Pain?  No/denies    Pain Score  0-No pain   Pain rising with lifting  to 5/10 at worst R>L    Pain Location  Shoulder    Pain Orientation  Right;Left   R>L   Pain Descriptors / Indicators  Burning    Pain Type  Chronic pain    Pain Onset  More than a month ago    Pain Frequency  Intermittent    Aggravating Factors   lifting     Pain Relieving Factors  nothing     Multiple Pain Sites  No         [facility] PT Assessment - 04/05/18 0938      AROM   AROM Assessment Site  Shoulder    Right/Left Shoulder  Right;Left    Right Shoulder Flexion  140 Degrees    Right Shoulder ABduction  120 Degrees    Right Shoulder Internal Rotation  --   FIR to T 11   Right Shoulder External Rotation  --   FER to T5   Left Shoulder Flexion  138 Degrees    Left Shoulder ABduction  113 Degrees    Left Shoulder Internal Rotation  --    FIR to T9   Left Shoulder External Rotation  --   FER to T 2     PROM   PROM Assessment Site  Shoulder    Right/Left Shoulder  Right;Left    Right Shoulder Flexion  150 Degrees    Right Shoulder ABduction  123 Degrees    Right Shoulder Internal Rotation  73 Degrees    Right Shoulder External Rotation  82 Degrees    Left Shoulder Flexion  152 Degrees    Left Shoulder ABduction  130 Degrees    Left Shoulder Internal Rotation  82 Degrees    Left Shoulder External Rotation  82 Degrees                   [facility] Adult PT Treatment/Exercise - 04/05/18 0933      Shoulder Exercises: Standing   External Rotation  Right;Left;15 reps    Theraband Level (Shoulder External Rotation)  Level 1 (Yellow)    External Rotation Limitations  leaning on doorseal    + scapular retraction    Internal Rotation  Right;Left;15 reps;Theraband    Theraband Level (Shoulder Internal Rotation)  Level 1 (Yellow)    Internal Rotation Limitations  cues for slow eccentric return     Flexion  Right;Left;10 reps    Flexion Limitations  Orange p-ball roll on wall    Cues required to avoid "arching back"   Row  10 reps;Strengthening;Theraband    Theraband Level (Shoulder Row)  Level 3 (Green)    Row Limitations  Good retraction without cueing however cues provided to avoid excessive scapular elevation       Shoulder Exercises: ROM/Strengthening   UBE (Upper Arm Bike)  Lvl 1.5, 3 min forwards/back      Shoulder Exercises: Stretch   Corner Stretch  30 seconds;2 reps    Corner Stretch Limitations  Cues to avoid excessive scap. elevation       Manual Therapy   Manual Therapy  Soft tissue mobilization    Soft tissue mobilization  STM to B pecs              PT Education - 04/05/18 1223    Education Details  HEP update;  yellow TB issued to pt.     Person(s) Educated  Patient    Methods  Explanation;Demonstration;Verbal cues;Handout    Comprehension  Verbalized understanding;Returned  demonstration;Verbal cues  required;Need further instruction       PT Short Term Goals - 03/29/18 0943      PT SHORT TERM GOAL #1   Title  Pt will be independent with initial HEP    Status  Achieved      PT SHORT TERM GOAL #2   Title  Pt will verbalize/demonstrate understanding of neutral shoulder posture to reduce risk of impingement with overhead motion    Status  Achieved        PT Long Term Goals - 03/29/18 0943      PT LONG TERM GOAL #1   Title  Pt will be independent with advanced/ongoing HEP    Status  On-going      PT LONG TERM GOAL #2   Title  B shoulder ROM WFL w/o increased pain to increase ease of ADLs    Status  On-going      PT LONG TERM GOAL #3   Title  B shoulder strength >/= 4/5 to 4+/5 for improved function    Status  On-going      PT LONG TERM GOAL #4   Title  Pt will report >/= 50% reduction in B shoulder pain    Status  On-going      PT LONG TERM GOAL #5   Title  Pt will report >/= 50% improvement in ability to complete ADLs and basic household chores w/o limitation due to B shoulder pain, LOM or weakness    Status  On-going            Plan - 04/05/18 1224    Clinical Impression Statement  Pt. reporting some improved ease reaching overhead.  Able to demo significant improvement in both shoulder AROM/PROM today and reports daily adherence to HEP.  Progressing well toward goals.  Tolerated progression of scapular/RTC strengthening activities well today and HEP updated Will continue to progress.      Clinical Impairments Affecting Rehab Potential  Hearing difficulty, R eye visual deficits     PT Treatment/Interventions  Patient/family education;ADLs/Self Care Home Management;Neuromuscular re-education;Therapeutic exercise;Therapeutic activities;Electrical Stimulation;Moist Heat;Cryotherapy;Iontophoresis 4mg /ml Dexamethasone;Ultrasound;Manual techniques;Dry needling;Passive range of motion;Taping    Consulted and Agree with Plan of Care  Patient        Patient will benefit from skilled therapeutic intervention in order to improve the following deficits and impairments:  Pain, Postural dysfunction, Improper body mechanics, Decreased range of motion, Decreased strength, Impaired UE functional use, Decreased activity tolerance, Increased muscle spasms, Impaired flexibility  Visit Diagnosis: Chronic left shoulder pain  Chronic right shoulder pain  Abnormal posture  Stiffness of left shoulder, not elsewhere classified  Stiffness of right shoulder, not elsewhere classified  Muscle weakness (generalized)     Problem List Patient Active Problem List   Diagnosis Date Noted  . Right shoulder pain 01/30/2017  . Left shoulder pain 05/19/2016  . Urinary hesitancy 10/18/2014  . Trapezius muscle spasm 10/18/2014  . Idiopathic neuropathy 10/18/2014  . Allergic reaction 09/28/2014  . Hyperglycemia 09/28/2014  . Acute bacterial bronchitis 08/18/2014  . Hematuria 08/18/2014  . Screening, ischemic heart disease 06/16/2014  . Lumbar radiculopathy, chronic 05/19/2014  . Midline thoracic back pain 03/24/2014  . BPPV (benign paroxysmal positional vertigo) 01/20/2014  . Right-sided low back pain with right-sided sciatica 12/21/2013  . Lightheadedness 09/22/2013  . Porokeratosis 02/25/2013  . Pain in joint, ankle and foot 02/25/2013  . Lumbosacral pain 02/15/2013  . Abdominal pain, epigastric 02/15/2013  . Acid reflux 02/15/2013  . Hearing loss 02/15/2013  .  Preventive measure 02/15/2013  . Chest pain 07/19/2012  . Bradycardia, sinus 07/19/2012  . Borderline glaucoma 03/15/2012  . 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 Dislocated inferior maxilla 01/10/2011  . H/O gastric ulcer 01/10/2011  . Back pain, chronic 11/21/2010    Bess Harvest, PTA 04/05/18 12:33 PM   [facility] [address]  [place], [place], [address] Phone: [phone]    Fax:  [phone]  Name: Brian [name] MRN: [id number] Date of Birth: 1945-11-29

## 2018-04-07 NOTE — Telephone Encounter (Signed)
Pt is scheduled for 04/09/18 and informed of appointment and location

## 2018-04-08 ENCOUNTER — Encounter: Payer: Self-pay | Admitting: Physical Therapy

## 2018-04-08 ENCOUNTER — Ambulatory Visit: Payer: MEDICARE | Admitting: Physical Therapy

## 2018-04-08 DIAGNOSIS — M25612 Stiffness of left shoulder, not elsewhere classified: Secondary | ICD-10-CM

## 2018-04-08 DIAGNOSIS — R293 Abnormal posture: Secondary | ICD-10-CM

## 2018-04-08 DIAGNOSIS — G8929 Other chronic pain: Secondary | ICD-10-CM

## 2018-04-08 DIAGNOSIS — M25511 Pain in right shoulder: Secondary | ICD-10-CM

## 2018-04-08 DIAGNOSIS — M25512 Pain in left shoulder: Secondary | ICD-10-CM | POA: Diagnosis not present

## 2018-04-08 DIAGNOSIS — M25611 Stiffness of right shoulder, not elsewhere classified: Secondary | ICD-10-CM

## 2018-04-08 DIAGNOSIS — M6281 Muscle weakness (generalized): Secondary | ICD-10-CM

## 2018-04-08 NOTE — Therapy (Signed)
[facility] [address]  [place], [place], [address] Phone: [phone]   Fax:  [phone]  Physical Therapy Treatment  Patient Details  Name: Brian [name] MRN: [id number] Date of Birth: 10/01/1945 Referring Provider (PT): Chuck Hint, MD   Encounter Date: 04/08/2018  PT End of Session - 04/08/18 0818    Visit Number  5    Number of Visits  10    Date for PT Re-Evaluation  04/28/18    Authorization Type  Medicare [employer] & [employer]    PT Start Time  0818    PT Stop Time  0903    PT Time Calculation (min)  45 min    Activity Tolerance  Patient tolerated treatment well    Behavior During Therapy  [facility] for tasks assessed/performed       Past Medical History:  Diagnosis Date  . Atypical chest pain     Negative cardiac catheterization 2014  . 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 Blind right eye   . 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 Chicken pox   . Chronic back pain   . Dislocation, jaw 1960s  . Edema    Left Leg  . Essential hypertension, benign   . GERD (gastroesophageal reflux disease)   . [place] measles   . Heart attack ([place])   . Hyperlipidemia   . Measles   . Mumps   . Osteopenia 11.12.10   bone density test  . Personal history of other diseases of digestive system 08.10.12   Gastric ulcer    Past Surgical History:  Procedure Laterality Date  . ABDOMINAL HERNIA REPAIR  1972  . APPENDECTOMY  1973  . BACK SURGERY  10/28/2017  . CATARACT EXTRACTION, BILATERAL  2005  . CHOLECYSTECTOMY  08/2016  . ESOPHAGOGASTRODUODENOSCOPY  06.2011  . EYE SURGERY  1972   Bilateral Lens Replacement  . FACIAL RECONSTRUCTION SURGERY  2008  . LEFT HEART CATH  02.17.2014  . [place], 2008   Dislocated Jaw  . PARS PLANA VITRECTOMY W/ REPAIR OF MACULAR HOLE  2005   macular hole repair, right eye  . PARS PLANA VITRECTOMY W/ REPAIR OF MACULAR HOLE  2010   mechanical, left  eye  . PROSTATE BIOPSY  [place] 2017   w/ Dr. [place] Crane  . WISDOM TOOTH EXTRACTION      There were no vitals filed for this visit.  Subjective Assessment - 04/08/18 0821    Subjective  Pt reporting shoulder pain now more of a soreness, with R worse than L.    Pertinent History  Lumbar fusion - 10/28/17    Diagnostic tests  03/04/18 - [facility] u/s Left shoulder: Biceps tendon intact on long and trans views. Focal tenosynovitis anteriorly. AC joint with mod arthropathy. Subscapularis and Infraspinatus normal without tears. Mod subacromial bursitis. Supraspinatus with focal cortical irregularity and small insertional tear that on trans view appears near full thickness but only 58mm in width.  [facility] u/s Right shoulder: Biceps tendon intact on long and trans views. Focal tenosynovitis anteriorly. AC joint with mod arthropathy. Subscapularis and Infraspinatus normal without tears. Mod subacromial bursitis. Supraspinatus with two interstitial tears about 4 and 91mm in width.    Patient Stated Goals  "to stop having any pain"    Currently in Pain?  Yes    Pain Score  --   3-4/10 R shoulder, 0/10 L shoulder   Pain Location  Shoulder  Pain Orientation  Right    Pain Descriptors / Indicators  Sore    Pain Frequency  Intermittent    Pain Score  5    Pain Location  Back    Pain Orientation  Lower    Pain Frequency  Intermittent                       [facility] Adult PT Treatment/Exercise - 04/08/18 0818      Exercises   Exercises  Shoulder      Shoulder Exercises: Supine   Protraction  Both;10 reps;Weights;Strengthening    Protraction Weight (lbs)  2    Horizontal ABduction  Both;10 reps;Theraband;Strengthening    Theraband Level (Shoulder Horizontal ABduction)  Level 1 (Yellow)    Horizontal ABduction Limitations  cues for scap retraction    Diagonals  Both;10 reps;Theraband;Strengthening    Theraband Level (Shoulder Diagonals)  Level 1 (Yellow)    Diagonals Limitations  cues for  scap retraction    Other Supine Exercises  B shoulder circles CW/CCWat 90 dg flexion - 2# x 10 each      Shoulder Exercises: Sidelying   External Rotation  Right;Left;10 reps;Weights    External Rotation Weight (lbs)  1    External Rotation Limitations  cues for scap retraction    ABduction  Right;Left;10 reps;Weights;Strengthening    ABduction Weight (lbs)  1      Shoulder Exercises: Therapy Ball   Flexion  Both;10 reps    Flexion Limitations  orange Pball on wall + alt hand lift off at full flexion with 1# wrist weights      Shoulder Exercises: ROM/Strengthening   UBE (Upper Arm Bike)  L2.0 x 6 min (3' fwd/3' back)    Wall Pushups  10 reps    Wall Pushups Limitations  push-up plus      Manual Therapy   Manual Therapy  Joint mobilization;Soft tissue mobilization;Myofascial release    Manual therapy comments  hooklying    Joint Mobilization  grade II - III R shoulder inf & A/P glides    Soft tissue mobilization  STM to R pecs, deltoids & post shoulder complex    Myofascial Release  manual TPR R pec major & teres group/lats               PT Short Term Goals - 03/29/18 0943      PT SHORT TERM GOAL #1   Title  Pt will be independent with initial HEP    Status  Achieved      PT SHORT TERM GOAL #2   Title  Pt will verbalize/demonstrate understanding of neutral shoulder posture to reduce risk of impingement with overhead motion    Status  Achieved        PT Long Term Goals - 03/29/18 0943      PT LONG TERM GOAL #1   Title  Pt will be independent with advanced/ongoing HEP    Status  On-going      PT LONG TERM GOAL #2   Title  B shoulder ROM WFL w/o increased pain to increase ease of ADLs    Status  On-going      PT LONG TERM GOAL #3   Title  B shoulder strength >/= 4/5 to 4+/5 for improved function    Status  On-going      PT LONG TERM GOAL #4   Title  Pt will report >/= 50% reduction in B shoulder pain  Status  On-going      PT LONG TERM GOAL #5   Title   Pt will report >/= 50% improvement in ability to complete ADLs and basic household chores w/o limitation due to B shoulder pain, LOM or weakness    Status  On-going            Plan - 04/08/18 0823    Clinical Impression Statement  Brian [name] reporting B shoulder pain decreasing, now more just soreness with R shoulder typically worse than left. R shoulder remains more protracted with decreased scapular stabilization, therefore targeted manual therapy and exercises to address improved shoulder alignment and glenohumeral rhythm with motion with pt able to demonstrate improving shoulder/scapular retraction & depression with overhead motions.    Rehab Potential  Good    Clinical Impairments Affecting Rehab Potential  Hearing difficulty, R eye visual deficits     PT Treatment/Interventions  Patient/family education;ADLs/Self Care Home Management;Neuromuscular re-education;Therapeutic exercise;Therapeutic activities;Electrical Stimulation;Moist Heat;Cryotherapy;Iontophoresis 4mg /ml Dexamethasone;Ultrasound;Manual techniques;Dry needling;Passive range of motion;Taping    Consulted and Agree with Plan of Care  Patient       Patient will benefit from skilled therapeutic intervention in order to improve the following deficits and impairments:  Pain, Postural dysfunction, Improper body mechanics, Decreased range of motion, Decreased strength, Impaired UE functional use, Decreased activity tolerance, Increased muscle spasms, Impaired flexibility  Visit Diagnosis: Chronic left shoulder pain  Chronic right shoulder pain  Abnormal posture  Stiffness of left shoulder, not elsewhere classified  Stiffness of right shoulder, not elsewhere classified  Muscle weakness (generalized)     Problem List Patient Active Problem List   Diagnosis Date Noted  . Right shoulder pain 01/30/2017  . Left shoulder pain 05/19/2016  . Urinary hesitancy 10/18/2014  . Trapezius muscle spasm 10/18/2014  . Idiopathic  neuropathy 10/18/2014  . Allergic reaction 09/28/2014  . Hyperglycemia 09/28/2014  . Acute bacterial bronchitis 08/18/2014  . Hematuria 08/18/2014  . Screening, ischemic heart disease 06/16/2014  . Lumbar radiculopathy, chronic 05/19/2014  . Midline thoracic back pain 03/24/2014  . BPPV (benign paroxysmal positional vertigo) 01/20/2014  . Right-sided low back pain with right-sided sciatica 12/21/2013  . Lightheadedness 09/22/2013  . Porokeratosis 02/25/2013  . Pain in joint, ankle and foot 02/25/2013  . Lumbosacral pain 02/15/2013  . Abdominal pain, epigastric 02/15/2013  . Acid reflux 02/15/2013  . Hearing loss 02/15/2013  . Preventive measure 02/15/2013  . Chest pain 07/19/2012  . Bradycardia, sinus 07/19/2012  . Borderline glaucoma 03/15/2012  . 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 Dislocated inferior maxilla 01/10/2011  . H/O gastric ulcer 01/10/2011  . Back pain, chronic 11/21/2010    Percival Spanish, PT, MPT 04/08/2018, 9:14 AM  [facility] [address]  [place], [place], [address] Phone: [phone]   Fax:  [phone]  Name: Brian [name] MRN: [id number] Date of Birth: [birth year]

## 2018-04-09 ENCOUNTER — Encounter: Payer: Self-pay | Admitting: Gastroenterology

## 2018-04-09 ENCOUNTER — Ambulatory Visit (INDEPENDENT_AMBULATORY_CARE_PROVIDER_SITE_OTHER): Payer: MEDICARE | Admitting: Gastroenterology

## 2018-04-09 VITALS — BP 128/70 | HR 62 | Ht 66.0 in | Wt 184.0 lb

## 2018-04-09 DIAGNOSIS — R899 Unspecified abnormal finding in specimens from other organs, systems and tissues: Secondary | ICD-10-CM | POA: Diagnosis not present

## 2018-04-09 DIAGNOSIS — R634 Abnormal weight loss: Secondary | ICD-10-CM | POA: Diagnosis not present

## 2018-04-09 DIAGNOSIS — R1011 Right upper quadrant pain: Secondary | ICD-10-CM

## 2018-04-09 MED ORDER — RABEPRAZOLE SODIUM 20 MG PO TBEC: 20.0000 mg | DELAYED_RELEASE_TABLET | Freq: Every day | ORAL | 11 refills | Status: DC

## 2018-04-09 NOTE — Patient Instructions (Signed)
If you are age 74 or older, your body mass index should be between 23-30. Your Body mass index is 29.7 kg/m. If this is out of the aforementioned range listed, please consider follow up with your Primary Care Provider.  If you are age 28 or younger, your body mass index should be between 19-25. Your Body mass index is 29.7 kg/m. If this is out of the aformentioned range listed, please consider follow up with your Primary Care Provider.   We have sent the following medications to your pharmacy for you to pick up at your convenience: Aciphex 20 mg once daily.   Please purchase the following medications over the counter and take as directed: Benefiber once daily.   You have been scheduled for a CT scan of the abdomen and pelvis at [facility].   You are scheduled on 04/13/18 at [facility] should arrive 15 minutes prior to your appointment time for registration. Please follow the written instructions below on the day of your exam:  WARNING: IF YOU ARE ALLERGIC TO IODINE/X-RAY DYE, PLEASE NOTIFY RADIOLOGY IMMEDIATELY AT [phone]! YOU WILL BE GIVEN A 13 HOUR PREMEDICATION PREP.  1) Do not eat or drink anything after 4am (4 hours prior to your test) 2) You have been given 2 bottles of oral contrast to drink. The solution may taste better if refrigerated, but do NOT add ice or any other liquid to this solution. Shake well before drinking.    Drink 1 bottle of contrast @ 6am (2 hours prior to your exam)  Drink 1 bottle of contrast @ 7am (1 hour prior to your exam)  You may take any medications as prescribed with a small amount of water, if necessary. If you take any of the following medications: METFORMIN, GLUCOPHAGE, GLUCOVANCE, AVANDAMET, RIOMET, FORTAMET, [place] MET, JANUMET, GLUMETZA or METAGLIP, you MAY be asked to HOLD this medication 48 hours AFTER the exam.  The purpose of you drinking the oral contrast is to aid in the visualization of your intestinal tract. The contrast  solution may cause some diarrhea. Depending on your individual set of symptoms, you may also receive an intravenous injection of x-ray contrast/dye. Plan on being at [facility] for 30 minutes or longer, depending on the type of exam you are having performed.  This test typically takes 30-45 minutes to complete.  If you have any questions regarding your exam or if you need to reschedule, you may call the CT department at [phone] between the hours of 8:00 am and 5:00 pm, Monday-Friday.    ______________________________________________________________________  Thank you,  Dr. Jackquline Denmark'

## 2018-04-09 NOTE — Progress Notes (Signed)
Chief Complaint:   Referring Provider:  Mackie Pai, PA-C      ASSESSMENT AND PLAN;   #1. RUQ Pain.  S/P lap chole 2018.  Has associated back pain and reproducible abdominal wall/back tenderness. 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 Halford Chessman). CT chest did show moderate/severe thoracic spondylosis. #2. H/O Barrett's esophagus with ?  Esophageal stricture status post EGD with dil 11/2017 by Dr Rolm Bookbinder (I could not find the actual EGD report in care-everywhere).  Per telephone conversation note, repeat EGD in 3 years. #3. Abn total bilirubin with Nl LFTs s/o Gilbert's syndrome. #4. Colorectal cancer screening (last colonoscopy in [place] 11/2009, heme-negative stools).  Next due 12/2019. #5. Weight loss  Plan: -Protonix changed to aciphex 20mg  po qd. if still with breakthrough symptoms, will increase AcipHex to 20 mg p.o. twice daily and add Pepcid 20 mg p.o. Nightly. May help with ILD/IPF. -Benefiber 1 tablespoon p.o. every morning with 8 ounces of water. -Proceed with CT scan of the abdomen and pelvis with p.o. and IV contrast. -Recommend using heating pads at night.  BenGay/IcyHot in the morning to take care of the musculoskeletal component.  If he continues to have back pain, then he would consider further work-up. -Colonoscopy due in 2021.  Will perform it earlier if he still has problems or if he has any red flag symptoms. -Monitor weight.  Patient is to report if he has any further weight loss. -FU in 12 weeks, earlier if still with problems.    HPI:    Brian [name] is a 72 y.o. male  RUQ pain x 2 to 3 months Gets worse with eating sweets, moving around Denies having any nausea, vomiting, further dysphagia or odynophagia. S/P EGD with esophageal dilatation as above.  Still has heartburn despite of Protonix.  Wants to change and try some other medicines. Has some abdominal bloating Denies having any significant constipation but has to strain to have bowel  movements. No nonsteroidals Denies having any weight loss Does complain of back pain ever since he had back surgery. Has weight loss 10 pounds over the last 2 to 3 months Denies having any melena or hematochezia.   Past Medical History:  Diagnosis Date  . Atypical chest pain     Negative cardiac catheterization 2014  . Barrett's esophagus   . 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 Blind right eye   . 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 Chicken pox   . Chronic back pain   . Dislocation, jaw 1960s  . Edema    Left Leg  . Essential hypertension, benign   . GERD (gastroesophageal reflux disease)   . [place] measles   . Heart attack ([place])   . Hyperlipidemia   . Measles   . Mumps   . Osteopenia 11.12.10   bone density test  . Personal history of other diseases of digestive system 08.10.12   Gastric ulcer    Past Surgical History:  Procedure Laterality Date  . ABDOMINAL HERNIA REPAIR  1972  . APPENDECTOMY  1973  . BACK SURGERY  10/28/2017  . CATARACT EXTRACTION, BILATERAL  2005  . CHOLECYSTECTOMY  08/2016  . COLONOSCOPY WITH ESOPHAGOGASTRODUODENOSCOPY (EGD)     about 2016. [facility]  . ESOPHAGOGASTRODUODENOSCOPY  06.2011  . EYE SURGERY  1972   Bilateral Lens Replacement  . FACIAL RECONSTRUCTION SURGERY  2008  . LEFT HEART CATH  02.17.2014  . [place], 2008   Dislocated Jaw  .  PARS PLANA VITRECTOMY W/ REPAIR OF MACULAR HOLE  2005   macular hole repair, right eye  . PARS PLANA VITRECTOMY W/ REPAIR OF MACULAR HOLE  2010   mechanical, left eye  . PROSTATE BIOPSY  [place] 2017   w/ Dr. [place] Crane  . WISDOM TOOTH EXTRACTION      Family History  Problem Relation Age of Onset  . Alzheimer's disease Mother        Deceased  . Diabetes Maternal Grandmother   . Heart attack Maternal Grandmother   . Prostate cancer Maternal Uncle   . Diabetes Maternal Uncle   . Healthy Son   . Cataracts Maternal Aunt   . Colon cancer Neg Hx   .  Esophageal cancer Neg Hx     Social History   Tobacco Use  . Smoking status: Former Smoker    Packs/day: 0.25    Years: 2.00    Pack years: 0.50    Types: Cigarettes    Last attempt to quit: 06/02/1966    Years since quitting: 51.8  . Smokeless tobacco: Never Used  Substance Use Topics  . Alcohol use: No    Alcohol/week: 0.0 standard drinks  . Drug use: No    Current Outpatient Medications  Medication Sig Dispense Refill  . aspirin (ASPIR-81) 81 MG EC tablet Take 1 tablet by mouth daily.    Marland Kitchen b complex vitamins tablet Take 1 tablet by mouth daily.    . benzonatate (TESSALON) 100 MG capsule Take 1 capsule (100 mg total) by mouth 3 (three) times daily as needed. 30 capsule 0  . Calcium Carbonate-Vitamin D (CALCIUM-VITAMIN D3 PO) Take by mouth daily.    . cyclobenzaprine (FLEXERIL) 5 MG tablet Take 1 tablet (5 mg total) by mouth at bedtime. 7 tablet 0  . diclofenac (VOLTAREN) 75 MG EC tablet Take 1 tablet (75 mg total) by mouth 2 (two) times daily. 20 tablet 0  . hydrochlorothiazide (MICROZIDE) 12.5 MG capsule Take 1 capsule (12.5 mg total) by mouth daily. 90 capsule 3  . linaclotide (LINZESS) 145 MCG CAPS capsule Take 1 capsule (145 mcg total) by mouth daily before breakfast. 30 capsule 0  . mirabegron ER (MYRBETRIQ) 50 MG TB24 tablet Take 50 mg by mouth daily.    . pantoprazole (PROTONIX) 40 MG tablet Take 1 tablet (40 mg total) by mouth 2 (two) times daily. 180 tablet 3  . simvastatin (ZOCOR) 40 MG tablet Take 1 tablet (40 mg total) by mouth every evening. 90 tablet 3  . tamsulosin (FLOMAX) 0.4 MG CAPS capsule Take 2 capsules (0.8 mg total) by mouth daily. 90 capsule 1  . timolol (TIMOPTIC) 0.5 % ophthalmic solution Place 1 drop into the left eye daily. 10 mL 12  . traMADol (ULTRAM) 50 MG tablet 1 tab po q 8 hours prn pain 15 tablet 0   No current facility-administered medications for this visit.     Allergies  Allergen Reactions  . Gabapentin Other (See Comments)    Feel  "weird"; Nausea; Weakness; Syncope    Review of Systems:  Constitutional: Denies fever, chills, diaphoresis, Has appetite change and fatigue.  HEENT: Denies photophobia, eye pain, redness, hearing loss, ear pain, congestion, sore throat, rhinorrhea, sneezing, mouth sores, neck pain, neck stiffness and tinnitus.   Respiratory: Has SOB, DOE, cough, chest tightness,  and wheezing.   Cardiovascular: Denies chest pain, palpitations and leg swelling.  Genitourinary: Denies dysuria, urgency, frequency, hematuria, flank pain and difficulty urinating.  Musculoskeletal: Denies myalgias, back pain, joint  swelling, arthralgias and gait problem.  Skin: No rash.  Neurological: Denies dizziness, seizures, syncope, weakness, light-headedness, numbness and headaches.  Hematological: Denies adenopathy. Easy bruising, personal or family bleeding history  Psychiatric/Behavioral: No anxiety or depression     Physical Exam:    BP 128/70   Pulse 62   Ht 5\' 6"  (1.676 m)   Wt 184 lb (83.5 kg)   BMI 29.70 kg/m  Filed Weights   04/09/18 1037  Weight: 184 lb (83.5 kg)   Constitutional:  Well-developed, in no acute distress. Psychiatric: Normal mood and affect. Behavior is normal. HEENT: Pupils normal.  Conjunctivae are normal. No scleral icterus. Neck supple.  Cardiovascular: Normal rate, regular rhythm. No edema Pulmonary/chest: Effort normal and breath sounds normal. No wheezing, rales or rhonchi. Abdominal: Soft, nondistended.  Has back tenderness, right flank reproducible pain, rib cage tenderness bowel sounds active throughout. There are no masses palpable. No hepatomegaly. Rectal:  defered Neurological: Alert and oriented to person place and time. Skin: Skin is warm and dry. No rashes noted.  Data Reviewed: I have personally reviewed following labs and imaging studies  CBC: CBC Latest Ref Rng & Units 03/29/2018 11/06/2017 11/04/2017  WBC 4.0 - 10.5 K/uL 3.8(L) 5.7 6.4  Hemoglobin 13.0 - 17.0 g/dL  13.6 11.0(L) 11.2(L)  Hematocrit 39.0 - 52.0 % 41.7 33.8(L) 33.7(L)  Platelets 150.0 - 400.0 K/uL 302.0 463.0(H) 453.0(H)    CMP: CMP Latest Ref Rng & Units 03/29/2018 03/04/2018 11/04/2017  Glucose 70 - 99 mg/dL 97 99 115(H)  BUN 6 - 23 mg/dL 9 11 11   Creatinine 0.40 - 1.50 mg/dL 1.03 0.98 0.93  Sodium 135 - 145 mEq/L 139 139 139  Potassium 3.5 - 5.1 mEq/L 4.0 4.3 5.0  Chloride 96 - 112 mEq/L 101 101 100  CO2 19 - 32 mEq/L 33(H) 34(H) 32  Calcium 8.4 - 10.5 mg/dL 9.4 9.9 9.2  Total Protein 6.0 - 8.3 g/dL 6.5 7.0 6.3  Total Bilirubin 0.2 - 1.2 mg/dL 1.6(H) 1.6(H) 1.0  Alkaline Phos 39 - 117 U/L 61 71 55  AST 0 - 37 U/L 14 18 38(H)  ALT 0 - 53 U/L 12 18 43     Radiology Studies: Dg Chest 2 View  Result Date: 03/29/2018 CLINICAL DATA:  72 year old male with shortness of breath for 2 weeks. Former smoker. EXAM: CHEST - 2 VIEW COMPARISON:  Chest radiograph 11/04/2017 and earlier. FINDINGS: Lung volumes are stable at the upper limits of normal. Mediastinal contours are stable and within normal limits. Visualized tracheal air column is within normal limits. No pneumothorax, pulmonary edema, pleural effusion or confluent pulmonary opacity. Stable mild increased interstitial markings. Negative visible bowel gas pattern. Stable cholecystectomy clips. No acute osseous abnormality identified. IMPRESSION: No acute cardiopulmonary abnormality. Electronically Signed   By: Genevie Ann M.D.   On: 03/29/2018 11:54   Dg Abd 1 View  Result Date: 03/29/2018 CLINICAL DATA:  72 year old male with generalized abdominal pain. History of constipation. EXAM: ABDOMEN - 1 VIEW COMPARISON:  CT Abdomen and Pelvis 11/04/2017. FINDINGS: Supine appearing views of the abdomen and pelvis. Lower lumbar fusion hardware is new since January. Chronic round dystrophic muscle calcification projects over the right lower quadrant, seen on series 2, image 36 of the comparison. Stable cholecystectomy clips. Non obstructed bowel gas  pattern. Only mild retained stool. No acute osseous abnormality identified. Left hemipelvis phleboliths. IMPRESSION: Non obstructed bowel gas pattern with only mild retained stool. Electronically Signed   By: Herminio Heads.D.  On: 03/29/2018 11:52   Ct Chest High Resolution  Result Date: 04/05/2018 CLINICAL DATA:  Dyspnea on exertion. History of MI. Evaluate for interstitial lung disease. EXAM: CT CHEST WITHOUT CONTRAST TECHNIQUE: Multidetector CT imaging of the chest was performed following the standard protocol without intravenous contrast. High resolution imaging of the lungs, as well as inspiratory and expiratory imaging, was performed. COMPARISON:  03/29/2018 chest radiograph. FINDINGS: Cardiovascular: Top-normal heart size. No significant pericardial effusion/thickening. Three-vessel coronary atherosclerosis. Atherosclerotic nonaneurysmal thoracic aorta. Normal caliber pulmonary arteries. Mediastinum/Nodes: No discrete thyroid nodules. Unremarkable esophagus. No pathologically enlarged axillary, mediastinal or hilar lymph nodes, noting limited sensitivity for the detection of hilar adenopathy on this noncontrast study. Lungs/Pleura: No pneumothorax. No pleural effusion. No acute consolidative airspace disease or lung masses. Tiny calcified posterior right upper lobe pulmonary nodule. Medial basilar right lower lobe 4 mm solid pulmonary nodule (series 5/image 106) is stable since 06/11/2017 CT abdomen study, considered benign. No additional significant pulmonary nodules. No significant air trapping on the expiration sequence. Minimal patchy subpleural reticulation and ground-glass attenuation in both lungs with a basilar gradient. No significant regions of traction bronchiectasis, architectural distortion or frank honeycombing. Upper abdomen: Small hiatal hernia.  Cholecystectomy. Musculoskeletal: No aggressive appearing focal osseous lesions. Moderate thoracic spondylosis. IMPRESSION: 1. Minimal basilar  predominant patchy subpleural reticulation and ground-glass attenuation without bronchiectasis or honeycombing. Findings may simply represent hypoventilatory change and/or nonspecific postinfectious/postinflammatory scarring. The possibility of a mild/early interstitial lung disease including usual interstitial pneumonia (UIP) is not excluded. Follow-up high-resolution chest CT in 12 months may be obtained to assess temporal pattern stability, as clinically warranted. Findings are indeterminate for UIP per consensus guidelines: Diagnosis of Idiopathic Pulmonary Fibrosis: An Official ATS/ERS/JRS/ALAT Clinical Practice Guideline. [place], Iss 5, [phone], Jan 31 2017. 2. Three-vessel coronary atherosclerosis. 3. Small hiatal hernia. Aortic Atherosclerosis (ICD10-I70.0). Electronically Signed   By: Ilona Sorrel M.D.   On: 04/05/2018 08:39      Carmell Austria, MD 04/09/2018, 10:56 AM  Cc: Mackie Pai, PA-C

## 2018-04-12 ENCOUNTER — Ambulatory Visit: Payer: MEDICARE | Admitting: Physical Therapy

## 2018-04-12 ENCOUNTER — Encounter: Payer: Self-pay | Admitting: Physical Therapy

## 2018-04-12 DIAGNOSIS — M25512 Pain in left shoulder: Principal | ICD-10-CM

## 2018-04-12 DIAGNOSIS — M25612 Stiffness of left shoulder, not elsewhere classified: Secondary | ICD-10-CM

## 2018-04-12 DIAGNOSIS — M25611 Stiffness of right shoulder, not elsewhere classified: Secondary | ICD-10-CM

## 2018-04-12 DIAGNOSIS — M6281 Muscle weakness (generalized): Secondary | ICD-10-CM

## 2018-04-12 DIAGNOSIS — G8929 Other chronic pain: Secondary | ICD-10-CM

## 2018-04-12 DIAGNOSIS — M25511 Pain in right shoulder: Secondary | ICD-10-CM

## 2018-04-12 DIAGNOSIS — R293 Abnormal posture: Secondary | ICD-10-CM

## 2018-04-12 NOTE — Therapy (Signed)
[facility] [address]  [place], [place], [address] Phone: [phone]   Fax:  [phone]  Physical Therapy Treatment  Patient Details  Name: Brian [name] MRN: [id number] Date of Birth: 01/16/1946 Referring Provider (PT): Chuck Hint, MD   Encounter Date: 04/12/2018  PT End of Session - 04/12/18 0931    Visit Number  6    Number of Visits  10    Date for PT Re-Evaluation  04/28/18    Authorization Type  Medicare [employer] & [employer]    PT Start Time  [id number]    PT Stop Time  1011    PT Time Calculation (min)  40 min    Activity Tolerance  Patient tolerated treatment well    Behavior During Therapy  [facility] for tasks assessed/performed       Past Medical History:  Diagnosis Date  . Atypical chest pain     Negative cardiac catheterization 2014  . Barrett's esophagus   . 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 Blind right eye   . 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 Chicken pox   . Chronic back pain   . Dislocation, jaw 1960s  . Edema    Left Leg  . Essential hypertension, benign   . GERD (gastroesophageal reflux disease)   . [place] measles   . Heart attack ([place])   . Hyperlipidemia   . Measles   . Mumps   . Osteopenia 11.12.10   bone density test  . Personal history of other diseases of digestive system 08.10.12   Gastric ulcer    Past Surgical History:  Procedure Laterality Date  . ABDOMINAL HERNIA REPAIR  1972  . APPENDECTOMY  1973  . BACK SURGERY  10/28/2017  . CATARACT EXTRACTION, BILATERAL  2005  . CHOLECYSTECTOMY  08/2016  . COLONOSCOPY WITH ESOPHAGOGASTRODUODENOSCOPY (EGD)     about 2016. [facility]  . ESOPHAGOGASTRODUODENOSCOPY  06.2011  . EYE SURGERY  1972   Bilateral Lens Replacement  . FACIAL RECONSTRUCTION SURGERY  2008  . LEFT HEART CATH  02.17.2014  . [place], 2008   Dislocated Jaw  . PARS PLANA VITRECTOMY W/ REPAIR OF MACULAR HOLE   2005   macular hole repair, right eye  . PARS PLANA VITRECTOMY W/ REPAIR OF MACULAR HOLE  2010   mechanical, left eye  . PROSTATE BIOPSY  [place] 2017   w/ Dr. [place] Crane  . WISDOM TOOTH EXTRACTION      There were no vitals filed for this visit.  Subjective Assessment - 04/12/18 0935    Subjective  Pt reporting "99%" improvement in shoulder pain - still notes "cracking" in shoulders, but no pain currently.    Pertinent History  Lumbar fusion - 10/28/17    Diagnostic tests  03/04/18 - [facility] u/s Left shoulder: Biceps tendon intact on long and trans views. Focal tenosynovitis anteriorly. AC joint with mod arthropathy. Subscapularis and Infraspinatus normal without tears. Mod subacromial bursitis. Supraspinatus with focal cortical irregularity and small insertional tear that on trans view appears near full thickness but only 58m in width.  [facility] u/s Right shoulder: Biceps tendon intact on long and trans views. Focal tenosynovitis anteriorly. AC joint with mod arthropathy. Subscapularis and Infraspinatus normal without tears. Mod subacromial bursitis. Supraspinatus with two interstitial tears about 4 and 344min width.    Patient Stated Goals  "to stop having any pain"    Currently in Pain?  Yes  Pain Score  0-No pain    Pain Location  Shoulder    Pain Score  5    Pain Location  Flank   referred from back   Pain Orientation  Right;Left    Pain Descriptors / Indicators  Tightness    Pain Type  Chronic pain         [facility] PT Assessment - 04/12/18 0931      Assessment   Medical Diagnosis  B shoulder pain    Referring Provider (PT)  Chuck Hint, MD    Onset Date/Surgical Date  --   >1 yr   Hand Dominance  Right    Next MD Visit  04/15/18      AROM   Right Shoulder Flexion  136 Degrees    Right Shoulder ABduction  124 Degrees    Right Shoulder Internal Rotation  --   FIR to T11   Right Shoulder External Rotation  --   FER to T4   Left Shoulder Flexion  134 Degrees    Left  Shoulder ABduction  114 Degrees    Left Shoulder Internal Rotation  --   FIR to T10   Left Shoulder External Rotation  --   FER to T2     Strength   Right Shoulder Flexion  4+/5    Right Shoulder ABduction  4+/5    Right Shoulder Internal Rotation  4/5    Right Shoulder External Rotation  4/5    Left Shoulder Flexion  4+/5    Left Shoulder ABduction  4/5    Left Shoulder Internal Rotation  4+/5    Left Shoulder External Rotation  4/5                   [facility] Adult PT Treatment/Exercise - 04/12/18 0931      Exercises   Exercises  Shoulder      Shoulder Exercises: Prone   Extension  Both;10 reps    Extension Limitations  I's - standing leaning over orange Pball on mat table    External Rotation  Both;10 reps    External Rotation Limitations  W's - standing leaning over orange Pball on mat table    Horizontal ABduction 1  Both;10 reps    Horizontal ABduction 1 Limitations  T's - standing leaning over orange Pball on mat table    Horizontal ABduction 2  Both;10 reps    Horizontal ABduction 2 Limitations  Y's - standing leaning over orange Pball on mat table      Shoulder Exercises: Standing   External Rotation  Right;Left;15 reps;Theraband;Strengthening    Theraband Level (Shoulder External Rotation)  Level 2 (Red)    External Rotation Limitations  standing on inside of doorframe as tactile cue to promote scapular retraction    Internal Rotation  Right;Left;15 reps;Theraband;Strengthening    Theraband Level (Shoulder Internal Rotation)  Level 2 (Red)    Internal Rotation Limitations  cues for neutral shoulder and controlled eccentric return    Extension  Both;15 reps;Theraband;Strengthening    Theraband Level (Shoulder Extension)  Level 3 (Green)    Extension Limitations  cue for scap retraction & depression as well as slower pace esp on eccentric release    Row  Both;15 reps;Theraband;Strengthening    Theraband Level (Shoulder Row)  Level 3 (Green)    Row  Limitations  good technique      Shoulder Exercises: ROM/Strengthening   UBE (Upper Arm Bike)  L2.0 x 6 min (3' fwd/3' back)  Wall Pushups  15 reps    Wall Pushups Limitations  push-up plus             PT Education - 04/12/18 1011    Education Details  HEP review/update - green TB provided for rows, IR/ER progressed to red TB, wall push-ups added    Person(s) Educated  Patient    Methods  Explanation;Demonstration;Handout    Comprehension  Verbalized understanding;Returned demonstration       PT Short Term Goals - 03/29/18 0943      PT SHORT TERM GOAL #1   Title  Pt will be independent with initial HEP    Status  Achieved      PT SHORT TERM GOAL #2   Title  Pt will verbalize/demonstrate understanding of neutral shoulder posture to reduce risk of impingement with overhead motion    Status  Achieved        PT Long Term Goals - 04/12/18 0939      PT LONG TERM GOAL #1   Title  Pt will be independent with advanced/ongoing HEP    Status  Partially Met      PT LONG TERM GOAL #2   Title  B shoulder ROM WFL w/o increased pain to increase ease of ADLs    Status  Achieved      PT LONG TERM GOAL #3   Title  B shoulder strength >/= 4/5 to 4+/5 for improved function    Status  Achieved      PT LONG TERM GOAL #4   Title  Pt will report >/= 50% reduction in B shoulder pain    Status  Achieved      PT LONG TERM GOAL #5   Title  Pt will report >/= 50% improvement in ability to complete ADLs and basic household chores w/o limitation due to B shoulder pain, LOM or weakness    Status  Achieved            Plan - 04/12/18 0938    Clinical Impression Statement  Brian [name] reporting >90% improvement in B shoulder pain and function with PT, noting some ongoing "cracking" but no pain currently. B shoulder AROM now [facility] and B shoulder strength >/= 4/5. Pt reporting no concerns with current HEP and tolerated progression of resistance bands for HEP. Goals mostly met and anticipate pt  should be able to transition to HEP within next 1-2 visits.     Rehab Potential  Good    Clinical Impairments Affecting Rehab Potential  Hearing difficulty, R eye visual deficits     PT Treatment/Interventions  Patient/family education;ADLs/Self Care Home Management;Neuromuscular re-education;Therapeutic exercise;Therapeutic activities;Electrical Stimulation;Moist Heat;Cryotherapy;Iontophoresis 37m/ml Dexamethasone;Ultrasound;Manual techniques;Dry needling;Passive range of motion;Taping    Consulted and Agree with Plan of Care  Patient       Patient will benefit from skilled therapeutic intervention in order to improve the following deficits and impairments:  Pain, Postural dysfunction, Improper body mechanics, Decreased range of motion, Decreased strength, Impaired UE functional use, Decreased activity tolerance, Increased muscle spasms, Impaired flexibility  Visit Diagnosis: Chronic left shoulder pain  Chronic right shoulder pain  Abnormal posture  Stiffness of left shoulder, not elsewhere classified  Stiffness of right shoulder, not elsewhere classified  Muscle weakness (generalized)     Problem List Patient Active Problem List   Diagnosis Date Noted  . Right shoulder pain 01/30/2017  . Left shoulder pain 05/19/2016  . Urinary hesitancy 10/18/2014  . Trapezius muscle spasm 10/18/2014  . Idiopathic neuropathy 10/18/2014  .  Allergic reaction 09/28/2014  . Hyperglycemia 09/28/2014  . Acute bacterial bronchitis 08/18/2014  . Hematuria 08/18/2014  . Screening, ischemic heart disease 06/16/2014  . Lumbar radiculopathy, chronic 05/19/2014  . Midline thoracic back pain 03/24/2014  . BPPV (benign paroxysmal positional vertigo) 01/20/2014  . Right-sided low back pain with right-sided sciatica 12/21/2013  . Lightheadedness 09/22/2013  . Porokeratosis 02/25/2013  . Pain in joint, ankle and foot 02/25/2013  . Lumbosacral pain 02/15/2013  . Abdominal pain, epigastric 02/15/2013   . Acid reflux 02/15/2013  . Hearing loss 02/15/2013  . Preventive measure 02/15/2013  . Chest pain 07/19/2012  . Bradycardia, sinus 07/19/2012  . Borderline glaucoma 03/15/2012  . 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 Dislocated inferior maxilla 01/10/2011  . H/O gastric ulcer 01/10/2011  . Back pain, chronic 11/21/2010    Percival Spanish, PT, MPT 04/12/2018, 11:46 AM  [facility] [address]  [place], [place], [address] Phone: [phone]   Fax:  [phone]  Name: Brian [name] MRN: [id number] Date of Birth: [birth year]

## 2018-04-13 ENCOUNTER — Ambulatory Visit (HOSPITAL_BASED_OUTPATIENT_CLINIC_OR_DEPARTMENT_OTHER)
Admission: RE | Admit: 2018-04-13 | Discharge: 2018-04-13 | Disposition: A | Discharge status: Home / Self Care | Payer: MEDICARE | Source: Ambulatory Visit | Attending: Gastroenterology | Admitting: Gastroenterology

## 2018-04-13 DIAGNOSIS — R1011 Right upper quadrant pain: Secondary | ICD-10-CM

## 2018-04-13 DIAGNOSIS — I700 Atherosclerosis of aorta: Secondary | ICD-10-CM | POA: Diagnosis not present

## 2018-04-13 DIAGNOSIS — R899 Unspecified abnormal finding in specimens from other organs, systems and tissues: Secondary | ICD-10-CM | POA: Insufficient documentation

## 2018-04-13 DIAGNOSIS — R634 Abnormal weight loss: Secondary | ICD-10-CM | POA: Insufficient documentation

## 2018-04-13 MED ORDER — IOPAMIDOL (ISOVUE-300) INJECTION 61%
100.0000 mL | Freq: Once | INTRAVENOUS | Status: AC | PRN
  Administered 2018-04-13: 100 mL via INTRAVENOUS

## 2018-04-14 ENCOUNTER — Encounter: Payer: MEDICARE | Admitting: Physical Therapy

## 2018-04-15 ENCOUNTER — Encounter: Payer: Self-pay | Admitting: Medical

## 2018-04-15 ENCOUNTER — Ambulatory Visit (INDEPENDENT_AMBULATORY_CARE_PROVIDER_SITE_OTHER): Payer: MEDICARE | Admitting: Family Medicine

## 2018-04-15 ENCOUNTER — Ambulatory Visit: Payer: MEDICARE | Admitting: Physical Therapy

## 2018-04-15 ENCOUNTER — Encounter: Payer: Self-pay | Admitting: Physical Therapy

## 2018-04-15 ENCOUNTER — Ambulatory Visit (INDEPENDENT_AMBULATORY_CARE_PROVIDER_SITE_OTHER): Payer: MEDICARE | Admitting: Medical

## 2018-04-15 ENCOUNTER — Encounter: Payer: Self-pay | Admitting: Family Medicine

## 2018-04-15 VITALS — BP 105/69 | HR 74 | Ht 64.0 in | Wt 182.0 lb

## 2018-04-15 VITALS — BP 113/71 | HR 60 | Temp 98.5°F | Resp 16 | Ht 64.0 in | Wt 183.4 lb

## 2018-04-15 DIAGNOSIS — M25512 Pain in left shoulder: Secondary | ICD-10-CM | POA: Diagnosis not present

## 2018-04-15 DIAGNOSIS — M25511 Pain in right shoulder: Secondary | ICD-10-CM

## 2018-04-15 DIAGNOSIS — M542 Cervicalgia: Secondary | ICD-10-CM

## 2018-04-15 DIAGNOSIS — R109 Unspecified abdominal pain: Secondary | ICD-10-CM | POA: Diagnosis not present

## 2018-04-15 DIAGNOSIS — G8929 Other chronic pain: Secondary | ICD-10-CM

## 2018-04-15 DIAGNOSIS — R0600 Dyspnea, unspecified: Secondary | ICD-10-CM

## 2018-04-15 DIAGNOSIS — M25611 Stiffness of right shoulder, not elsewhere classified: Secondary | ICD-10-CM

## 2018-04-15 DIAGNOSIS — G44209 Tension-type headache, unspecified, not intractable: Secondary | ICD-10-CM

## 2018-04-15 DIAGNOSIS — M6281 Muscle weakness (generalized): Secondary | ICD-10-CM

## 2018-04-15 DIAGNOSIS — R293 Abnormal posture: Secondary | ICD-10-CM

## 2018-04-15 DIAGNOSIS — M25612 Stiffness of left shoulder, not elsewhere classified: Secondary | ICD-10-CM

## 2018-04-15 NOTE — Patient Instructions (Signed)
Recent work-up for abdomen pain by gastroenterologist was negative.   You stated that the pain may be radicular type pain originating from the lower lumbar spine.  That can happen.  Surgery was just recent in May.  I would recommend that you call your back surgeon's office and asked to speak to nurse that surgeon.  Try to pose the question and see if they agree that it is possible.  If your pain worsens or changes let me know.  In that event might try to get repeat imaging of lower spine and might refer you back to former surgeon for facial pain.  Your dyspnea on exertion work-up was negative.  You mention that pulmonologist did not give you any new medications but recommended you get sleep apnea study.  Would recommend following through with his recommendations.  Reviewed CT imaging study with you today.  We will repeat CT in 1 year.  If you have worsening or changing respiratory symptoms please let me know.   Your headaches recently are very mild and intermittent.  No associated neurologic type signs and symptoms.  You mention possible correlation with doing shoulder exercises.  Also occasional neck pain.  So this causes me to think possible low-level tension headache.  If your headaches recur with any neck pain then recommend taking low-dose of Flexeril.  Rx advisement given.  Follow-up in 3 to 4 weeks or as needed.

## 2018-04-15 NOTE — Progress Notes (Signed)
Subjective:    Patient ID: Brian [name], male    DOB: [birth year], 72 y.o.   MRN: [id number]  HPI  Pt has been to GI MD. Pt states GI MD thinks that his prior  back issues likey  causing his rt side abdomen region discomfort. CT abd/pelvis was negative. Pt has known chronic back pain. Some less pain/ better since surgery in May, 2019.   I did also send pt to pulmonologist for dyspnea on exertion. Pt went to them and ct of chest was done.   IMPRESSION: 1. Minimal basilar predominant patchy subpleural reticulation and ground-glass attenuation without bronchiectasis or honeycombing. Findings may simply represent hypoventilatory change and/or nonspecific postinfectious/postinflammatory scarring. The possibility of a mild/early interstitial lung disease including usual interstitial pneumonia (UIP) is not excluded. Follow-up high-resolution chest CT in 12 months may be obtained to assess temporal pattern stability, as clinically warranted. Findings are indeterminate for UIP per consensus guidelines: Diagnosis of Idiopathic Pulmonary Fibrosis: An Official ATS/ERS/JRS/ALAT Clinical Practice Guideline. [place], Iss 5, [phone], Jan 31 2017. 2. Three-vessel coronary atherosclerosis. 3. Small hiatal hernia.  Pt states no meds given by pulmonologist but sleep study recommended.  Pt states occasional intermittment. mild ha. He does note some neck pain recently but not sure if associated with ha. He is doing some PT for shoulder. HA goes away usually 20-30 minutes. No gross motor or sensory deficits.    Review of Systems  Constitutional: Negative for chills, fatigue and fever.  Respiratory: Negative for cough, choking, shortness of breath and wheezing.   Cardiovascular: Negative for chest pain and palpitations.  Gastrointestinal: Positive for abdominal pain.       See hpi.  Musculoskeletal: Positive for back pain and neck pain.       See hpi.  Neurological:  Positive for headaches. Negative for dizziness.       See hpi.  Hematological: Negative for adenopathy. Does not bruise/bleed easily.  Psychiatric/Behavioral: Negative for confusion and dysphoric mood. The patient is not nervous/anxious and is not hyperactive.     Past Medical History:  Diagnosis Date  . Atypical chest pain     Negative cardiac catheterization 2014  . Barrett's esophagus   . 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 Blind right eye   . 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 Chicken pox   . Chronic back pain   . Dislocation, jaw 1960s  . Edema    Left Leg  . Essential hypertension, benign   . GERD (gastroesophageal reflux disease)   . [place] measles   . Heart attack ([place])   . Hyperlipidemia   . Measles   . Mumps   . Osteopenia 11.12.10   bone density test  . Personal history of other diseases of digestive system 08.10.12   Gastric ulcer     Social History   Socioeconomic History  . Marital status: Single    Spouse name: Not on file  . Number of children: 0  . Years of education: Not on file  . Highest education level: Not on file  Occupational History  . Not on file  Social Needs  . Financial resource strain: Not on file  . Food insecurity:    Worry: Not on file    Inability: Not on file  . Transportation needs:    Medical: Not on file    Non-medical: Not on file  Tobacco Use  . Smoking status: Former Smoker    Packs/day:  0.25    Years: 2.00    Pack years: 0.50    Types: Cigarettes    Last attempt to quit: 06/02/1966    Years since quitting: 51.9  . Smokeless tobacco: Never Used  Substance and Sexual Activity  . Alcohol use: No    Alcohol/week: 0.0 standard drinks  . Drug use: No  . Sexual activity: Never  Lifestyle  . Physical activity:    Days per week: Not on file    Minutes per session: Not on file  . Stress: Not on file  Relationships  . Social connections:    Talks on phone: Not on file    Gets together: Not on  file    Attends religious service: Not on file    Active member of club or organization: Not on file    Attends meetings of clubs or organizations: Not on file    Relationship status: Not on file  . Intimate partner violence:    Fear of current or ex partner: Not on file    Emotionally abused: Not on file    Physically abused: Not on file    Forced sexual activity: Not on file  Other Topics Concern  . Not on file  Social History Narrative  . Not on file    Past Surgical History:  Procedure Laterality Date  . ABDOMINAL HERNIA REPAIR  1972  . APPENDECTOMY  1973  . BACK SURGERY  10/28/2017  . CATARACT EXTRACTION, BILATERAL  2005  . CHOLECYSTECTOMY  08/2016  . COLONOSCOPY WITH ESOPHAGOGASTRODUODENOSCOPY (EGD)     about 2016. [facility]  . ESOPHAGOGASTRODUODENOSCOPY  06.2011  . EYE SURGERY  1972   Bilateral Lens Replacement  . FACIAL RECONSTRUCTION SURGERY  2008  . LEFT HEART CATH  02.17.2014  . [place], 2008   Dislocated Jaw  . PARS PLANA VITRECTOMY W/ REPAIR OF MACULAR HOLE  2005   macular hole repair, right eye  . PARS PLANA VITRECTOMY W/ REPAIR OF MACULAR HOLE  2010   mechanical, left eye  . PROSTATE BIOPSY  [place] 2017   w/ Dr. Nevada Crane  . WISDOM TOOTH EXTRACTION      Family History  Problem Relation Age of Onset  . Alzheimer's disease Mother        Deceased  . Diabetes Maternal Grandmother   . Heart attack Maternal Grandmother   . Prostate cancer Maternal Uncle   . Diabetes Maternal Uncle   . Healthy Son   . Cataracts Maternal Aunt   . Colon cancer Neg Hx   . Esophageal cancer Neg Hx     Allergies  Allergen Reactions  . Gabapentin Other (See Comments)    Feel "weird"; Nausea; Weakness; Syncope    Current Outpatient Medications on File Prior to Visit  Medication Sig Dispense Refill  . aspirin (ASPIR-81) 81 MG EC tablet Take 1 tablet by mouth daily.    Marland Kitchen b complex vitamins tablet Take 1 tablet by mouth daily.    . benzonatate (TESSALON)  100 MG capsule Take 1 capsule (100 mg total) by mouth 3 (three) times daily as needed. 30 capsule 0  . Calcium Carbonate-Vitamin D (CALCIUM-VITAMIN D3 PO) Take by mouth daily.    . cyclobenzaprine (FLEXERIL) 5 MG tablet Take 1 tablet (5 mg total) by mouth at bedtime. 7 tablet 0  . diclofenac (VOLTAREN) 75 MG EC tablet Take 1 tablet (75 mg total) by mouth 2 (two) times daily. 20 tablet 0  . hydrochlorothiazide (MICROZIDE) 12.5  MG capsule Take 1 capsule (12.5 mg total) by mouth daily. 90 capsule 3  . linaclotide (LINZESS) 145 MCG CAPS capsule Take 1 capsule (145 mcg total) by mouth daily before breakfast. 30 capsule 0  . mirabegron ER (MYRBETRIQ) 50 MG TB24 tablet Take 50 mg by mouth daily.    . pantoprazole (PROTONIX) 40 MG tablet Take 1 tablet (40 mg total) by mouth 2 (two) times daily. 180 tablet 3  . simvastatin (ZOCOR) 40 MG tablet Take 1 tablet (40 mg total) by mouth every evening. 90 tablet 3  . tamsulosin (FLOMAX) 0.4 MG CAPS capsule Take 2 capsules (0.8 mg total) by mouth daily. 90 capsule 1  . timolol (TIMOPTIC) 0.5 % ophthalmic solution Place 1 drop into the left eye daily. 10 mL 12  . traMADol (ULTRAM) 50 MG tablet 1 tab po q 8 hours prn pain 15 tablet 0   No current facility-administered medications on file prior to visit.     BP 113/71   Pulse 60   Temp 98.5 F (36.9 C) (Oral)   Resp 16   Ht 5\' 4"  (1.626 m)   Wt 183 lb 6.4 oz (83.2 kg)   SpO2 99%   BMI 31.48 kg/m       Objective:   Physical Exam  General Mental Status- Alert. General Appearance- Not in acute distress.   Skin General: Color- Normal Color. Moisture- Normal Moisture.  Neck Carotid Arteries- Normal color. Moisture- Normal Moisture. No carotid bruits. No JVD. Faint left side trapezius tenderness  Chest and Lung Exam Auscultation: Breath Sounds:-Normal.  Cardiovascular Auscultation:Rythm- Regular. Murmurs & Other Heart Sounds:Auscultation of the heart reveals- No  Murmurs.  Abdomen Inspection:-Inspeection Normal. Palpation/Percussion:Note:No mass. Palpation and Percussion of the abdomen reveal- Non Tender, Non Distended + BS, no rebound or guarding.    Neurologic Cranial Nerve exam:- CN III-XII intact(No nystagmus), symmetric smile. Strength:- 5/5 equal and symmetric strength both upper and lower extremities.      Assessment & Plan:  Recent work-up for abdomen pain by gastroenterologist was negative.   You stated that the pain may be radicular type pain originating from the lower lumbar spine.  That can happen.  Surgery was just recent in May.  I would recommend that you call your back surgeon's office and asked to speak to nurse that surgeon.  Try to pose the question and see if they agree that it is possible.  If your pain worsens or changes let me know.  In that event might try to get repeat imaging of lower spine and might refer you back to former surgeon for facial pain.  Your dyspnea on exertion work-up was negative.  You mention that pulmonologist did not give you any new medications but recommended you get sleep apnea study.  Would recommend following through with his recommendations.  Reviewed CT imaging study with you today.  We will repeat CT in 1 year.  If you have worsening or changing respiratory symptoms please let me know.   Your headaches recently are very mild and intermittent.  No associated neurologic type signs and symptoms.  You mention possible correlation with doing shoulder exercises.  Also occasional neck pain.  So this causes me to think possible low-level tension headache.  If your headaches recur with any neck pain then recommend taking low-dose of Flexeril.  Rx advisement given.  Follow-up in 3 to 4 weeks or as needed.  Mackie Pai, PA-C

## 2018-04-15 NOTE — Patient Instructions (Signed)
Finish up your scheduled physical therapy visits then do home exercises 3-4 times a week for 6 more weeks. Follow up with me as needed.

## 2018-04-15 NOTE — Progress Notes (Signed)
PCP: Mackie Pai, PA-C  Subjective:   HPI: Patient is a 72 y.o. male here for bilateral shoulder pain.  9/3: Patient has history of chronic right shoulder pain. Did not notice much benefit in past with subacromial injection, oral prednisone dose pack. States about 2 weeks ago he fell backwards directly onto back, posterior shoulders. Pain level now 6/10 left shoulder, 5/10 right and sharp. Worse with movement, trying to reach overhead. Some radiation into left arm with numbness on radial side of forearm. Tried tramadol but no other medications. Pain in both shoulder is primarily superior and lateral. States he is not sleeping but not due to his shoulders. No skin changes. Some numbness only in left wrist area.  10/3: Patient returns with continued pain in shoulders. Pain at 5/10 in left, 3/10 on right laterally, both sharp. Difficulty reaching overhead and cannot sleep due to pain. Too much pain doing home exercises, hard to tolerate these. No new injuries. No skin changes, numbness.  11/14: Patient reports he's doing much better. Motion is improved. Pain at most 0/10 on left, 3/10 on right laterally. Doing well with physical therapy and home exercises. No skin changes.  Past Medical History:  Diagnosis Date  . Atypical chest pain     Negative cardiac catheterization 2014  . Barrett's esophagus   . 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 Blind right eye   . 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 Chicken pox   . Chronic back pain   . Dislocation, jaw 1960s  . Edema    Left Leg  . Essential hypertension, benign   . GERD (gastroesophageal reflux disease)   . [place] measles   . Heart attack ([place])   . Hyperlipidemia   . Measles   . Mumps   . Osteopenia 11.12.10   bone density test  . Personal history of other diseases of digestive system 08.10.12   Gastric ulcer    Current Outpatient Medications on File Prior to Visit  Medication Sig Dispense  Refill  . aspirin (ASPIR-81) 81 MG EC tablet Take 1 tablet by mouth daily.    Marland Kitchen b complex vitamins tablet Take 1 tablet by mouth daily.    . benzonatate (TESSALON) 100 MG capsule Take 1 capsule (100 mg total) by mouth 3 (three) times daily as needed. 30 capsule 0  . Calcium Carbonate-Vitamin D (CALCIUM-VITAMIN D3 PO) Take by mouth daily.    . cyclobenzaprine (FLEXERIL) 5 MG tablet Take 1 tablet (5 mg total) by mouth at bedtime. 7 tablet 0  . diclofenac (VOLTAREN) 75 MG EC tablet Take 1 tablet (75 mg total) by mouth 2 (two) times daily. 20 tablet 0  . hydrochlorothiazide (MICROZIDE) 12.5 MG capsule Take 1 capsule (12.5 mg total) by mouth daily. 90 capsule 3  . linaclotide (LINZESS) 145 MCG CAPS capsule Take 1 capsule (145 mcg total) by mouth daily before breakfast. 30 capsule 0  . mirabegron ER (MYRBETRIQ) 50 MG TB24 tablet Take 50 mg by mouth daily.    . pantoprazole (PROTONIX) 40 MG tablet Take 1 tablet (40 mg total) by mouth 2 (two) times daily. 180 tablet 3  . simvastatin (ZOCOR) 40 MG tablet Take 1 tablet (40 mg total) by mouth every evening. 90 tablet 3  . tamsulosin (FLOMAX) 0.4 MG CAPS capsule Take 2 capsules (0.8 mg total) by mouth daily. 90 capsule 1  . timolol (TIMOPTIC) 0.5 % ophthalmic solution Place 1 drop into the left eye daily. 10 mL 12  . traMADol (ULTRAM)  50 MG tablet 1 tab po q 8 hours prn pain 15 tablet 0   No current facility-administered medications on file prior to visit.     Past Surgical History:  Procedure Laterality Date  . ABDOMINAL HERNIA REPAIR  1972  . APPENDECTOMY  1973  . BACK SURGERY  10/28/2017  . CATARACT EXTRACTION, BILATERAL  2005  . CHOLECYSTECTOMY  08/2016  . COLONOSCOPY WITH ESOPHAGOGASTRODUODENOSCOPY (EGD)     about 2016. [facility]  . ESOPHAGOGASTRODUODENOSCOPY  06.2011  . EYE SURGERY  1972   Bilateral Lens Replacement  . FACIAL RECONSTRUCTION SURGERY  2008  . LEFT HEART CATH  02.17.2014  . [place], 2008   Dislocated  Jaw  . PARS PLANA VITRECTOMY W/ REPAIR OF MACULAR HOLE  2005   macular hole repair, right eye  . PARS PLANA VITRECTOMY W/ REPAIR OF MACULAR HOLE  2010   mechanical, left eye  . PROSTATE BIOPSY  [place] 2017   w/ Dr. [place] Crane  . WISDOM TOOTH EXTRACTION      Allergies  Allergen Reactions  . Gabapentin Other (See Comments)    Feel "weird"; Nausea; Weakness; Syncope    Social History   Socioeconomic History  . Marital status: Single    Spouse name: Not on file  . Number of children: 0  . Years of education: Not on file  . Highest education level: Not on file  Occupational History  . Not on file  Social Needs  . Financial resource strain: Not on file  . Food insecurity:    Worry: Not on file    Inability: Not on file  . Transportation needs:    Medical: Not on file    Non-medical: Not on file  Tobacco Use  . Smoking status: Former Smoker    Packs/day: 0.25    Years: 2.00    Pack years: 0.50    Types: Cigarettes    Last attempt to quit: 06/02/1966    Years since quitting: 51.9  . Smokeless tobacco: Never Used  Substance and Sexual Activity  . Alcohol use: No    Alcohol/week: 0.0 standard drinks  . Drug use: No  . Sexual activity: Never  Lifestyle  . Physical activity:    Days per week: Not on file    Minutes per session: Not on file  . Stress: Not on file  Relationships  . Social connections:    Talks on phone: Not on file    Gets together: Not on file    Attends religious service: Not on file    Active member of club or organization: Not on file    Attends meetings of clubs or organizations: Not on file    Relationship status: Not on file  . Intimate partner violence:    Fear of current or ex partner: Not on file    Emotionally abused: Not on file    Physically abused: Not on file    Forced sexual activity: Not on file  Other Topics Concern  . Not on file  Social History Narrative  . Not on file    Family History  Problem Relation Age of Onset  .  Alzheimer's disease Mother        Deceased  . Diabetes Maternal Grandmother   . Heart attack Maternal Grandmother   . Prostate cancer Maternal Uncle   . Diabetes Maternal Uncle   . Healthy Son   . Cataracts Maternal Aunt   . Colon cancer Neg Hx   .  Esophageal cancer Neg Hx     BP 105/69   Pulse 74   Ht 5\' 4"  (1.626 m)   Wt 182 lb (82.6 kg)   BMI 31.24 kg/m   Review of Systems: See HPI above.     Objective:  Physical Exam:  Gen: NAD, comfortable in exam room  Right shoulder: No swelling, ecchymoses.  No gross deformity. No TTP. FROM without pain. Negative Hawkins, Neers. Negative Yergasons. Strength 5/5 with empty can and resisted internal/external rotation. Negative apprehension. NV intact distally.  Left shoulder: No swelling, ecchymoses.  No gross deformity. No TTP. FROM without pain. Negative Hawkins, Neers. Negative Yergasons. Strength 5/5 with empty can and resisted internal/external rotation. Negative apprehension. NV intact distally.  Assessment & Plan:  1. Bilateral shoulder pain - much improved with physical therapy - will continue this for his scheduled visits through next week then transition to home exercises.  Tylenol, tramadol only if needed.  F/u prn.

## 2018-04-15 NOTE — Therapy (Addendum)
[facility] [address]  [place], [place], [address] Phone: [phone]   Fax:  [phone]  Physical Therapy Treatment / Discharge Summary  Patient Details  Name: Brian [name] MRN: [id number] Date of Birth: 05-05-1946 Referring Provider (PT): Chuck Hint, MD   Encounter Date: 04/15/2018  PT End of Session - 04/15/18 0934    Visit Number  7    Number of Visits  10    Date for PT Re-Evaluation  04/28/18    Authorization Type  Medicare RR & [employer]    PT Start Time  [id number]    PT Stop Time  1016    PT Time Calculation (min)  42 min    Activity Tolerance  Patient tolerated treatment well    Behavior During Therapy  [facility] for tasks assessed/performed       Past Medical History:  Diagnosis Date  . Atypical chest pain     Negative cardiac catheterization 2014  . Barrett's esophagus   . 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 Blind right eye   . 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 Chicken pox   . Chronic back pain   . Dislocation, jaw 1960s  . Edema    Left Leg  . Essential hypertension, benign   . GERD (gastroesophageal reflux disease)   . [place] measles   . Heart attack ([place])   . Hyperlipidemia   . Measles   . Mumps   . Osteopenia 11.12.10   bone density test  . Personal history of other diseases of digestive system 08.10.12   Gastric ulcer    Past Surgical History:  Procedure Laterality Date  . ABDOMINAL HERNIA REPAIR  1972  . APPENDECTOMY  1973  . BACK SURGERY  10/28/2017  . CATARACT EXTRACTION, BILATERAL  2005  . CHOLECYSTECTOMY  08/2016  . COLONOSCOPY WITH ESOPHAGOGASTRODUODENOSCOPY (EGD)     about 2016. [facility]  . ESOPHAGOGASTRODUODENOSCOPY  06.2011  . EYE SURGERY  1972   Bilateral Lens Replacement  . FACIAL RECONSTRUCTION SURGERY  2008  . LEFT HEART CATH  02.17.2014  . [place], 2008   Dislocated Jaw  . PARS PLANA VITRECTOMY W/ REPAIR  OF MACULAR HOLE  2005   macular hole repair, right eye  . PARS PLANA VITRECTOMY W/ REPAIR OF MACULAR HOLE  2010   mechanical, left eye  . PROSTATE BIOPSY  [place] 2017   w/ Dr. [place] Crane  . WISDOM TOOTH EXTRACTION      There were no vitals filed for this visit.  Subjective Assessment - 04/15/18 0937    Subjective  Ivar reports Dr. Barbaraann Barthel gave him the choice of transitioning to HEP at this point or finishing up remaining PT visits, then transition to HEP - Pt wishing to finish up remaining visits.    Pertinent History  Lumbar fusion - 10/28/17    Diagnostic tests  03/04/18 - [facility]K u/s Left shoulder: Biceps tendon intact on long and trans views. Focal tenosynovitis anteriorly. AC joint with mod arthropathy. Subscapularis and Infraspinatus normal without tears. Mod subacromial bursitis. Supraspinatus with focal cortical irregularity and small insertional tear that on trans view appears near full thickness but only 44m in width.  MSK u/s Right shoulder: Biceps tendon intact on long and trans views. Focal tenosynovitis anteriorly. AC joint with mod arthropathy. Subscapularis and Infraspinatus normal without tears. Mod subacromial bursitis. Supraspinatus with two interstitial tears about 4 and 370min width.  Patient Stated Goals  "to stop having any pain"    Currently in Pain?  Yes    Pain Score  3     Pain Location  Shoulder    Pain Orientation  Right    Pain Descriptors / Indicators  Sore    Pain Type  Chronic pain    Pain Frequency  Intermittent                       [facility] Adult PT Treatment/Exercise - 04/15/18 0934      Exercises   Exercises  Shoulder      Shoulder Exercises: Standing   Protraction  Both;10 reps    Protraction Limitations  protraction rocking on inverted BOSU on mat table    External Rotation  Both;10 reps;Theraband;Strengthening    Theraband Level (Shoulder External Rotation)  Level 2 (Red)    External Rotation Limitations  at ~80 dg ABD -  difficulty maintaining  upper arm position during shoulder rotation    Internal Rotation  Both;10 reps;Theraband;Strengthening    Theraband Level (Shoulder Internal Rotation)  Level 2 (Red)    Internal Rotation Limitations  at ~80 dg ABD - difficulty maintaining  upper arm position during shoulder rotation    Flexion  Right;Left;10 reps;Weights;Strengthening    Shoulder Flexion Weight (lbs)  2    Flexion Limitations  cabinet reaches to 1st & 2nd shelves    Diagonals  Right;Left;10 reps;Theraband;Strengthening    Theraband Level (Shoulder Diagonals)  Level 1 (Yellow)    Diagonals Limitations  D1/D2 extension & flexion       Shoulder Exercises: Therapy Ball   Flexion  Both;10 reps    Flexion Limitations  orange Pball on wall + alt hand lift off at full flexion with 2# wrist weights      Shoulder Exercises: ROM/Strengthening   UBE (Upper Arm Bike)  L2.5 x 6 min (3' fwd/3' back)    Wall Pushups  15 reps    Wall Pushups Limitations  on orange Pball    "W" Arms  W row with red TB 10 x 3"    Ball on Wall  R/L shoulder circles CW/CCW x10 each with small ball on wall    Rhythmic Stabilization, Seated  Standing with hand on orange Pball on wall 2 x 20 "               PT Short Term Goals - 03/29/18 1007      PT SHORT TERM GOAL #1   Title  Pt will be independent with initial HEP    Status  Achieved      PT SHORT TERM GOAL #2   Title  Pt will verbalize/demonstrate understanding of neutral shoulder posture to reduce risk of impingement with overhead motion    Status  Achieved        PT Long Term Goals - 04/12/18 0939      PT LONG TERM GOAL #1   Title  Pt will be independent with advanced/ongoing HEP    Status  Partially Met      PT LONG TERM GOAL #2   Title  B shoulder ROM WFL w/o increased pain to increase ease of ADLs    Status  Achieved      PT LONG TERM GOAL #3   Title  B shoulder strength >/= 4/5 to 4+/5 for improved function    Status  Achieved      PT LONG TERM  GOAL #4  Title  Pt will report >/= 50% reduction in B shoulder pain    Status  Achieved      PT LONG TERM GOAL #5   Title  Pt will report >/= 50% improvement in ability to complete ADLs and basic household chores w/o limitation due to B shoulder pain, LOM or weakness    Status  Achieved            Plan - 04/15/18 0940    Clinical Impression Statement  Corleone reporting MD pleased with his progress and has left it up to him regarding continuing with PT vs transitioning to HEP - pt wishing to complete remaining scheduled visits. Treatment today focsing on more dynamic strengthening in functional patterns including cabinet reaches and UE PNF diagonals - close monitoring necessary for proper technique but exercises well tolerated.    Rehab Potential  Good    Clinical Impairments Affecting Rehab Potential  Hearing difficulty, R eye visual deficits     PT Treatment/Interventions  Patient/family education;ADLs/Self Care Home Management;Neuromuscular re-education;Therapeutic exercise;Therapeutic activities;Electrical Stimulation;Moist Heat;Cryotherapy;Iontophoresis 22m/ml Dexamethasone;Ultrasound;Manual techniques;Dry needling;Passive range of motion;Taping    Consulted and Agree with Plan of Care  Patient       Patient will benefit from skilled therapeutic intervention in order to improve the following deficits and impairments:  Pain, Postural dysfunction, Improper body mechanics, Decreased range of motion, Decreased strength, Impaired UE functional use, Decreased activity tolerance, Increased muscle spasms, Impaired flexibility  Visit Diagnosis: Chronic left shoulder pain  Chronic right shoulder pain  Abnormal posture  Stiffness of left shoulder, not elsewhere classified  Stiffness of right shoulder, not elsewhere classified  Muscle weakness (generalized)     Problem List Patient Active Problem List   Diagnosis Date Noted  . Right shoulder pain 01/30/2017  . Left shoulder pain  05/19/2016  . Urinary hesitancy 10/18/2014  . Trapezius muscle spasm 10/18/2014  . Idiopathic neuropathy 10/18/2014  . Allergic reaction 09/28/2014  . Hyperglycemia 09/28/2014  . Acute bacterial bronchitis 08/18/2014  . Hematuria 08/18/2014  . Screening, ischemic heart disease 06/16/2014  . Lumbar radiculopathy, chronic 05/19/2014  . Midline thoracic back pain 03/24/2014  . BPPV (benign paroxysmal positional vertigo) 01/20/2014  . Right-sided low back pain with right-sided sciatica 12/21/2013  . Lightheadedness 09/22/2013  . Porokeratosis 02/25/2013  . Pain in joint, ankle and foot 02/25/2013  . Lumbosacral pain 02/15/2013  . Abdominal pain, epigastric 02/15/2013  . Acid reflux 02/15/2013  . Hearing loss 02/15/2013  . Preventive measure 02/15/2013  . Chest pain 07/19/2012  . Bradycardia, sinus 07/19/2012  . Borderline glaucoma 03/15/2012  . 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 Dislocated inferior maxilla 01/10/2011  . H/O gastric ulcer 01/10/2011  . Back pain, chronic 11/21/2010    JPercival Spanish PT, MPT 04/15/2018, 10:23 AM  [facility] [place] [address]Phone: [phone]  Fax:  [phone] Name: AZERICK PREVETTEMRN: [id number] of Birth: 606/29/1947 PHYSICAL THERAPY DISCHARGE SUMMARY  Visits from Start of Care: 7  Current functional level related to goals / functional outcomes:   Refer to above clinical impression for status as of last visit on 04/15/18. Sephiroth had been seen by MD prior to PT on 04/05/18 and had reported MD pleased with his progress and had left it up to him regarding continuing with PT vs transitioning to HEP - at the time, pt wishing to complete remaining scheduled  visits but then called on 04/19/18 to cancel all remaining appointments.   Remaining deficits:   As above.   Education / Equipment:   HEP  Plan: Patient agrees  to discharge.  Patient goals were mostly met. Patient is being discharged due to the patient's request.  ?????     Percival Spanish, PT, MPT 05/11/18, 10:17 AM  [facility] [address]  [place], [place], [address] Phone: [phone]   Fax:  [phone]

## 2018-04-16 ENCOUNTER — Telehealth: Payer: Self-pay | Admitting: Gastroenterology

## 2018-04-16 NOTE — Telephone Encounter (Signed)
You gave him Aciphex 20 mg, would you like to switch it to something else?

## 2018-04-17 NOTE — Telephone Encounter (Signed)
Did he try generic?  That should not be expensive. Otherwise we will go back on Protonix 40mg  po bid and add Pepcid 20mg  at bedtime x 2 weeks, then reduce Protonix to once a day and continue Pepcid at bedtime until follow-up visit.

## 2018-04-19 ENCOUNTER — Encounter: Payer: MEDICARE | Admitting: Physical Therapy

## 2018-04-19 MED ORDER — FAMOTIDINE 20 MG PO TABS: 20.0000 mg | ORAL_TABLET | Freq: Every day | ORAL | 3 refills | Status: DC

## 2018-04-19 NOTE — Telephone Encounter (Signed)
Patient said that it was the generic that was too expensive. I gave him instructions to take the Protonix 40 mg twice daily x 2 weeks and then once daily and Pepcid at bedtime. I have sent prescription to patients pharmacy.

## 2018-04-20 ENCOUNTER — Ambulatory Visit (INDEPENDENT_AMBULATORY_CARE_PROVIDER_SITE_OTHER): Payer: MEDICARE | Admitting: Pulmonary Disease

## 2018-04-20 ENCOUNTER — Ambulatory Visit: Payer: MEDICARE | Admitting: Pulmonary Disease

## 2018-04-20 DIAGNOSIS — J849 Interstitial pulmonary disease, unspecified: Secondary | ICD-10-CM | POA: Diagnosis not present

## 2018-04-20 LAB — PULMONARY FUNCTION TEST
DL/VA % PRED: 99 %
DL/VA: 4.16 ml/min/mmHg/L
DLCO COR % PRED: 71 %
DLCO COR: 17.32 ml/min/mmHg
DLCO unc % pred: 69 %
DLCO unc: 16.81 ml/min/mmHg
FEF 25-75 Post: 3.89 L/sec
FEF 25-75 Pre: 2.39 L/sec
FEF2575-%CHANGE-POST: 63 %
FEF2575-%PRED-PRE: 134 %
FEF2575-%Pred-Post: 219 %
FEV1-%CHANGE-POST: 12 %
FEV1-%PRED-PRE: 89 %
FEV1-%Pred-Post: 101 %
FEV1-Post: 2.15 L
FEV1-Pre: 1.9 L
FEV1FVC-%CHANGE-POST: 0 %
FEV1FVC-%Pred-Pre: 112 %
FEV6-%Change-Post: 13 %
FEV6-%PRED-PRE: 81 %
FEV6-%Pred-Post: 92 %
FEV6-PRE: 2.22 L
FEV6-Post: 2.52 L
FEV6FVC-%Change-Post: 0 %
FEV6FVC-%Pred-Post: 106 %
FEV6FVC-%Pred-Pre: 105 %
FVC-%Change-Post: 13 %
FVC-%PRED-POST: 87 %
FVC-%PRED-PRE: 77 %
FVC-POST: 2.53 L
FVC-PRE: 2.24 L
POST FEV6/FVC RATIO: 100 %
PRE FEV1/FVC RATIO: 85 %
PRE FEV6/FVC RATIO: 99 %
Post FEV1/FVC ratio: 85 %
RV % pred: 85 %
RV: 1.88 L
TLC % pred: 80 %
TLC: 4.68 L

## 2018-04-20 NOTE — Progress Notes (Signed)
PFT done today. 

## 2018-04-21 ENCOUNTER — Telehealth: Payer: Self-pay | Admitting: Pulmonary Disease

## 2018-04-21 DIAGNOSIS — G4733 Obstructive sleep apnea (adult) (pediatric): Secondary | ICD-10-CM | POA: Diagnosis not present

## 2018-04-21 NOTE — Telephone Encounter (Signed)
HRCT chest 04/05/18 >> 4 mm nodule RLL (benign), minimal patchy subpleural reticulation and GGO b/l, small HH   Please let him know that CT chest and PFT show changes from scarring in his lungs.  These changes are mild.  This was the concern when he was seen in the office on 04/01/18.  Will discuss in more detail at his next visit in December.

## 2018-04-22 ENCOUNTER — Other Ambulatory Visit: Payer: Self-pay | Admitting: *Deleted

## 2018-04-22 DIAGNOSIS — R0683 Snoring: Secondary | ICD-10-CM

## 2018-04-22 NOTE — Telephone Encounter (Signed)
Called and talked with Patient about results and recommendations. Patient stated understanding.  Nothing further at this time.

## 2018-04-26 ENCOUNTER — Telehealth: Payer: Self-pay | Admitting: Gastroenterology

## 2018-04-26 NOTE — Telephone Encounter (Signed)
I called patient and instructed him to take the Protonix 40 mg twice daily x 2 weeks and then once daily and the Pepcid at bedtime. Patient voiced understanding.

## 2018-04-26 NOTE — Telephone Encounter (Signed)
Patient states he has questions regarding both medications pantoprazole and pepcid and when to take them. Please call pt and advise.

## 2018-05-03 ENCOUNTER — Telehealth: Payer: Self-pay | Admitting: Pulmonary Disease

## 2018-05-03 DIAGNOSIS — G4733 Obstructive sleep apnea (adult) (pediatric): Secondary | ICD-10-CM | POA: Diagnosis not present

## 2018-05-03 NOTE — Telephone Encounter (Signed)
Called and spoke with patient regarding results.  Informed the patient of results and recommendations today. Pt verbalized understanding and denied any questions or concerns at this time.  Nothing further needed.  

## 2018-05-03 NOTE — Telephone Encounter (Signed)
HST 04/20/18 >> AHI 35.5, SpO2 78%.   Please let him know that his sleep study showed severe sleep apnea.  Will discuss in more detail at [facility] on 05/06/18.

## 2018-05-06 ENCOUNTER — Encounter: Payer: Self-pay | Admitting: Pulmonary Disease

## 2018-05-06 ENCOUNTER — Ambulatory Visit (INDEPENDENT_AMBULATORY_CARE_PROVIDER_SITE_OTHER): Payer: MEDICARE | Admitting: Pulmonary Disease

## 2018-05-06 ENCOUNTER — Other Ambulatory Visit (INDEPENDENT_AMBULATORY_CARE_PROVIDER_SITE_OTHER): Payer: MEDICARE

## 2018-05-06 VITALS — BP 116/76 | HR 62 | Ht 66.0 in | Wt 183.0 lb

## 2018-05-06 DIAGNOSIS — J849 Interstitial pulmonary disease, unspecified: Secondary | ICD-10-CM | POA: Diagnosis not present

## 2018-05-06 DIAGNOSIS — K22719 Barrett's esophagus with dysplasia, unspecified: Secondary | ICD-10-CM | POA: Diagnosis not present

## 2018-05-06 DIAGNOSIS — G4733 Obstructive sleep apnea (adult) (pediatric): Secondary | ICD-10-CM

## 2018-05-06 NOTE — Patient Instructions (Signed)
Lab tests today  Will arrange for CPAP set up  Follow up in 2 months

## 2018-05-06 NOTE — Progress Notes (Signed)
[name]  Chief Complaint  Patient presents with  . Follow-up    Pt had PFT and HST, here today for results.     Constitutional:  BP 116/76 (BP Location: Left Arm, Cuff Size: Normal)   Pulse 62   Ht 5\' 6"  (1.676 m)   Wt 183 lb (83 kg)   SpO2 98%   BMI 29.54 kg/m   Past Medical History:  GERD, Barrett's Esophagus, HLD, Macular degeneration, Glaucoma, Sleep apnea, BPH, Overactive bladder, Vertigo, PUD, HTN, Neuropathy, Back pain, CAD  Brief Summary:  Brian [name] is a 72 y.o. male former smoker with cough.  Since his last visit, he had CT chest, PFT, and sleep study.  CT chest showed subtle subpleural reticulation (reviewed by me).  His PFT showed bronchodilator response and mild diffusion defect.  He is not having much cough at present.  Denies sputum, chest pain, or wheezing.  He has tried flovent and albuterol before, but these didn't help.  He is not having skin rash or joint swelling.  His home sleep study severe obstructive sleep apnea.  Physical Exam:   Appearance - well kempt   ENMT - no sinus tenderness, no nasal discharge, no oral exudate, MP 3, wears dentures  Neck - no masses, trachea midline, no thyromegaly, no elevation in JVP  Respiratory - normal appearance of chest wall, normal respiratory effort w/o accessory muscle use, no dullness on percussion, no tactile fremitus, no wheezing or rales  CV - s1s2 regular rate and rhythm, no murmurs, no peripheral edema, no varicosities, radial pulses symmetric  GI - soft, non tender, no masses, no hepatosplenomegaly  Lymph - no adenopathy noted in neck and axillary areas  MSK - normal gait  Ext - no cyanosis, clubbing, or joint inflammation noted  Skin - no rashes, lesions, or ulcers  Neuro - normal strength, oriented x 3  Psych - normal mood and affect   Discussion:  He has subtle change of ILD on his CT chest and PFT shows mild diffusion defect.  This could be  related to history of GERD with Barrett's Esophagus.    He had bronchodilator responsiveness on PFT which could be suggestive of asthma.  However, prior trials of inhaler therapy weren't beneficial.  He has minimal symptoms at present related to his cough.  His sleep study showed severe sleep apnea.  Assessment/Plan:   Interstitial lung disease. - could be related to acid reflux - will check RF, ANA, SCL 70, SSA/SSB  Obstructive sleep apnea. - reviewed his sleep study with him - treatment options reviewed - will arrange for auto CPAP set up  Bronchodilator responsiveness on PFT. - he was tried previously on inhaler therapy w/o benefit - monitor clinically  GERD with hx of Barrett's Esophagus. - followed by GI in [place]   Patient Instructions  Lab tests today  Will arrange for CPAP set up  Follow up in 2 months  Time spent 29 minutes  Chesley Mires, MD [place] Pager: [phone] 05/06/2018, 9:28 AM  Flow Sheet     Pulmonary tests:  HRCT chest 04/05/18 >> 4 mm nodule RLL (benign), minimal patchy subpleural reticulation and GGO b/l, small HH PFY 04/20/18 >> FEV1 2.15 (101%), FEV1% 85, TLC 4.68 (80%), DLCO 69%, +BD  Sleep tests:  HST 04/20/18 >> AHI 35.5, SpO2 78%.  Cardiac tests:  LHC 07/19/12 >> non significant CAD, EF 60% Echo 11/27/15 >> normal EF  Medications:   Allergies  as of 05/06/2018      Reactions   Gabapentin Other (See Comments)   Feel "weird"; Nausea; Weakness; Syncope      Medication List        Accurate as of 05/06/18  9:28 AM. Always use your most recent med list.          ASPIRIN 81 81 MG EC tablet Generic drug:  aspirin Take 1 tablet by mouth daily.   b complex vitamins tablet Take 1 tablet by mouth daily.   benzonatate 100 MG capsule Commonly known as:  TESSALON Take 1 capsule (100 mg total) by mouth 3 (three) times daily as needed.   CALCIUM-VITAMIN D3 PO Take by mouth daily.   cyclobenzaprine 5  MG tablet Commonly known as:  FLEXERIL Take 1 tablet (5 mg total) by mouth at bedtime.   diclofenac 75 MG EC tablet Commonly known as:  VOLTAREN Take 1 tablet (75 mg total) by mouth 2 (two) times daily.   famotidine 20 MG tablet Commonly known as:  PEPCID Take 1 tablet (20 mg total) by mouth at bedtime.   hydrochlorothiazide 12.5 MG capsule Commonly known as:  MICROZIDE Take 1 capsule (12.5 mg total) by mouth daily.   linaclotide 145 MCG Caps capsule Commonly known as:  LINZESS Take 1 capsule (145 mcg total) by mouth daily before breakfast.   mirabegron ER 50 MG Tb24 tablet Commonly known as:  MYRBETRIQ Take 50 mg by mouth daily.   pantoprazole 40 MG tablet Commonly known as:  PROTONIX Take 1 tablet (40 mg total) by mouth 2 (two) times daily.   simvastatin 40 MG tablet Commonly known as:  ZOCOR Take 1 tablet (40 mg total) by mouth every evening.   tamsulosin 0.4 MG Caps capsule Commonly known as:  FLOMAX Take 2 capsules (0.8 mg total) by mouth daily.   timolol 0.5 % ophthalmic solution Commonly known as:  TIMOPTIC Place 1 drop into the left eye daily.   traMADol 50 MG tablet Commonly known as:  ULTRAM 1 tab po q 8 hours prn pain       Past Surgical History:  He  has a past surgical history that includes Appendectomy (1973); Abdominal hernia repair (1972); Eye surgery (1972); Mandible surgery (1968, 2008); Wisdom tooth extraction; Cataract extraction, bilateral (2005); Pars plana vitrectomy w/ repair of macular hole (2005); Pars plana vitrectomy w/ repair of macular hole (2010); Esophagogastroduodenoscopy (27.0623); Facial reconstruction surgery (2008); Left heart cath (02.17.2014); Prostate biopsy [place] 2017); Cholecystectomy (08/2016); Back surgery (10/28/2017); and Colonoscopy with esophagogastroduodenoscopy (egd).  Family History:  His family history includes Alzheimer's disease in his mother; Cataracts in his maternal aunt; Diabetes in his maternal grandmother  and maternal uncle; Healthy in his son; Heart attack in his maternal grandmother; Prostate cancer in his maternal uncle.  Social History:  He  reports that he quit smoking about 51 years ago. His smoking use included cigarettes. He has a 0.50 pack-year smoking history. He has never used smokeless tobacco. He reports that he does not drink alcohol or use drugs.

## 2018-05-07 ENCOUNTER — Telehealth: Payer: Self-pay | Admitting: Pulmonary Disease

## 2018-05-07 ENCOUNTER — Telehealth: Payer: Self-pay | Admitting: *Deleted

## 2018-05-07 LAB — SJOGREN'S SYNDROME ANTIBODS(SSA + SSB)
ENA SSA (RO) Ab: <0.2 AI (ref 0.0–0.9)
ENA SSB (LA) Ab: <0.2 AI (ref 0.0–0.9)

## 2018-05-07 LAB — RHEUMATOID FACTOR: Rheumatoid fact SerPl-aCnc: 10.1 IU/mL (ref 0.0–13.9)

## 2018-05-07 LAB — ANTI-SCLERODERMA ANTIBODY

## 2018-05-07 NOTE — Telephone Encounter (Signed)
Called and spoke with patient regarding results.  Informed the patient of results and recommendations today. Pt verbalized understanding and denied any questions or concerns at this time.  Nothing further needed.  

## 2018-05-07 NOTE — Telephone Encounter (Signed)
Serology 05/06/18 >> ANA, SCL 70, RF, SSA/SSB negative   Please let him know his lab tests were normal.

## 2018-05-07 NOTE — Telephone Encounter (Signed)
Received Lab Report results from [facility]; forwarded to provider/SLS 12/06

## 2018-05-17 MED FILL — PREGABALIN 50 MG CAPS: 50 | 15 days supply | Qty: 30 | Fill #0

## 2018-05-18 ENCOUNTER — Telehealth: Payer: Self-pay | Admitting: Pulmonary Disease

## 2018-05-18 NOTE — Telephone Encounter (Signed)
I received form and filled out missing information. I will place in VS to sign folder.

## 2018-05-18 NOTE — Telephone Encounter (Signed)
Spoke with Brian [name] and advised her to fax the form to [place] again so we can fill out the necessary items. 1. Checked heated tubing 2. Fill in pressure settings. Will await fax.

## 2018-05-19 NOTE — Telephone Encounter (Signed)
Kelli, please advise when you have an update on when form has been taken care of for pt by VS. Thanks!

## 2018-05-20 NOTE — Telephone Encounter (Signed)
Form has not been completed and signed at this time. Will f/u this week.

## 2018-05-21 NOTE — Telephone Encounter (Signed)
Pt is calling back about his cpap machine. Pt said that he can get it from [facility]   Pt [phone]

## 2018-05-21 NOTE — Telephone Encounter (Signed)
Attempted to call Patient.  Left detailed VM that Dr. Halford Chessman had his form, and someone will contact him once this form has been completed and faxed by Dr. Halford Chessman.  Will route to [facility]. As [employer]

## 2018-05-21 NOTE — Telephone Encounter (Signed)
Pt is calling back [phone]

## 2018-05-21 NOTE — Telephone Encounter (Signed)
Kelli to follow up with Dr. Halford Chessman.

## 2018-05-21 NOTE — Telephone Encounter (Signed)
Attempted to call pt but no answer. Left message for pt to return call. 

## 2018-05-21 NOTE — Telephone Encounter (Signed)
Called patient unable to reach left message to give us a call back.

## 2018-05-21 NOTE — Telephone Encounter (Signed)
Pt is calling back [phone]

## 2018-05-24 NOTE — Telephone Encounter (Signed)
Paperwork for cpap supplies has been signed, completed and faxed. Nothing further needed at this time.

## 2018-05-24 NOTE — Telephone Encounter (Signed)
Attempted to call patient today regarding cpap machine. I did not receive an answer at time of call. I have left a voicemail message for pt to return call. X1

## 2018-05-24 NOTE — Telephone Encounter (Signed)
Will route this message to [facility] to follow up

## 2018-05-25 ENCOUNTER — Telehealth: Payer: Self-pay | Admitting: Pulmonary Disease

## 2018-05-25 NOTE — Telephone Encounter (Signed)
Patient is aware order will be sent to [facility] this morning.

## 2018-05-25 NOTE — Telephone Encounter (Signed)
Per [facility] verbally- order has been faxed to [facility] this morning. Will close encounter, as nothing further is needed.

## 2018-05-26 IMAGING — DX DG CHEST 2V
2 series · 2 of 2 positions shown · non-contrast
Comparison: 02/11/2016

CLINICAL DATA: 70-year-old male with a history of cough. Previous
smoking

EXAM:
CHEST  2 VIEW

[chest pa]
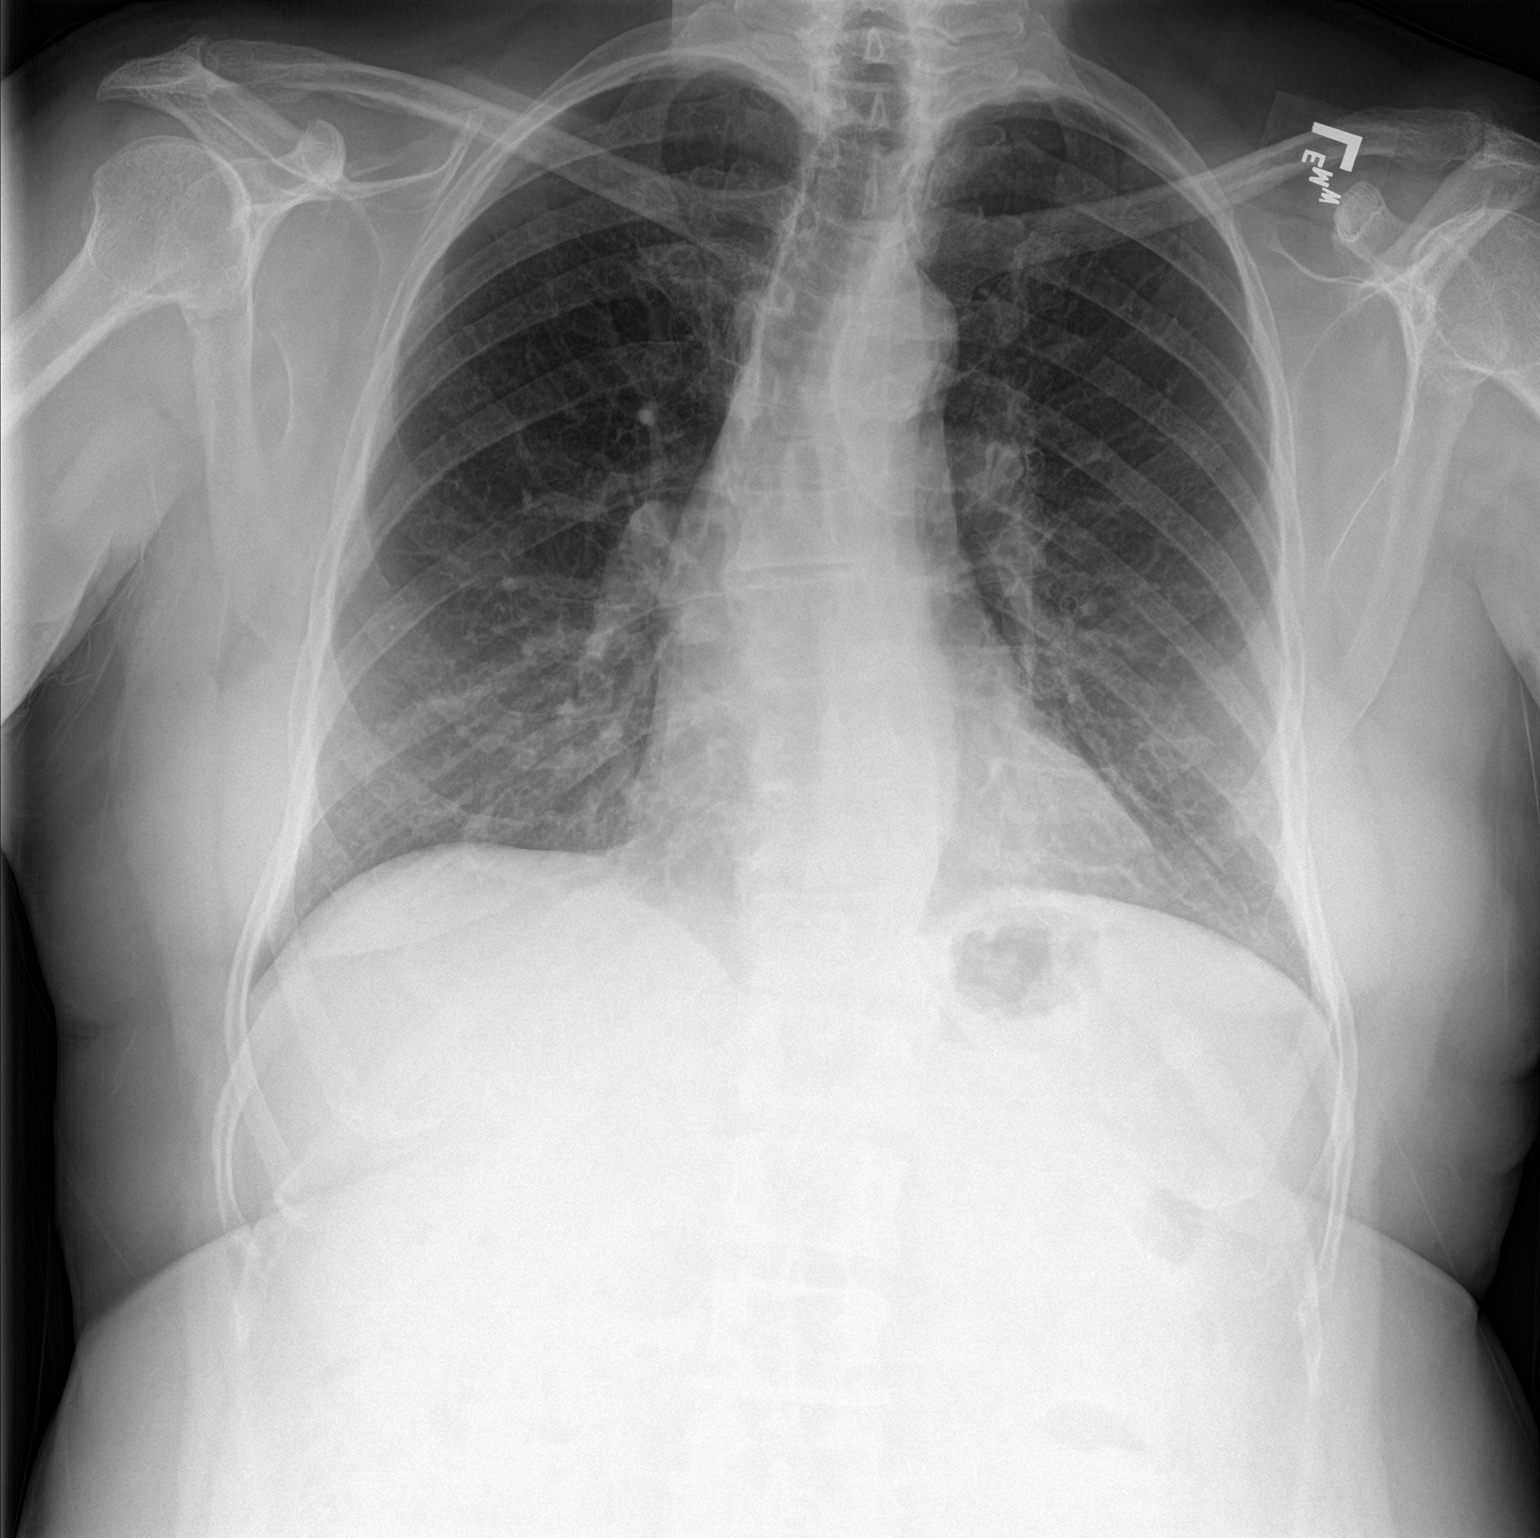

[chest lat]
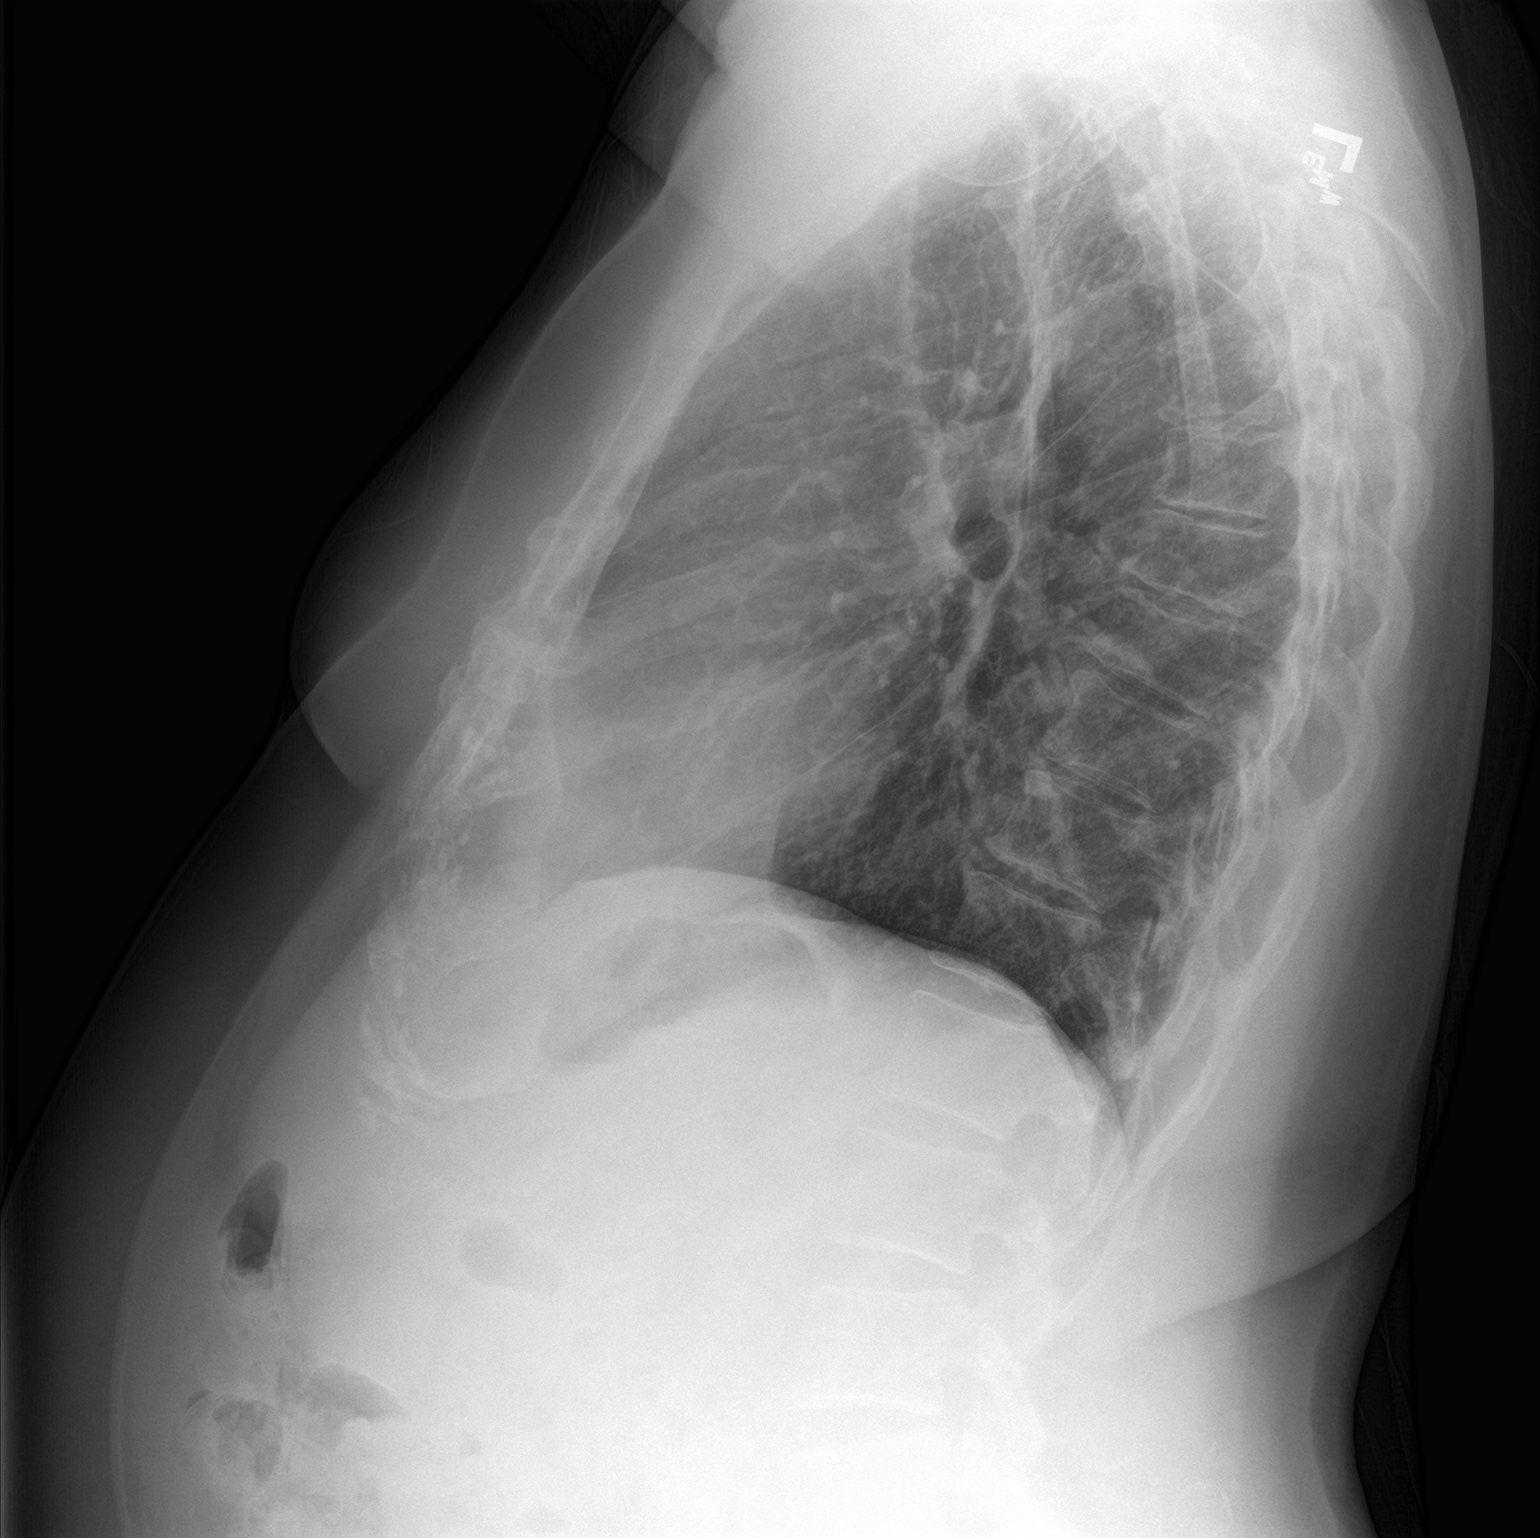

[2 of 2 positions shown; findings below may reference images not displayed]

FINDINGS: Coarsened interstitial markings persist within the lungs. No
confluent airspace disease. No pleural effusion or pneumothorax.

Cardiomediastinal silhouette unchanged. Calcifications of the aortic
arch.

No displaced fracture.

Degenerative changes of the spine.  Scoliotic curvature again noted.
IMPRESSION: Chronic lung changes without evidence of lobar pneumonia.

Given the history, note that low-dose CT lung cancer screening is
recommended for patients who are 55-80 years of age with a 30+
pack-year history of smoking, and who are currently smoking or quit
<=15 years ago. Patient may referred for pulmonary evaluation and
possible screening test.

Aortic atherosclerosis

## 2018-05-27 ENCOUNTER — Telehealth: Payer: Self-pay | Admitting: Pulmonary Disease

## 2018-05-27 DIAGNOSIS — G4733 Obstructive sleep apnea (adult) (pediatric): Secondary | ICD-10-CM

## 2018-05-27 NOTE — Telephone Encounter (Signed)
Patient called back, did not have CB number and states will call back with it.

## 2018-05-27 NOTE — Telephone Encounter (Signed)
LMTCB

## 2018-05-27 NOTE — Telephone Encounter (Signed)
Patient called with CB is [phone].

## 2018-05-27 NOTE — Telephone Encounter (Signed)
LMTCB Patient needs to be made aware that [facility] is out of network and if he chooses them he will be paying for everything out of pocket.   Per referral notes the closest DME company in network is 50 miles away in [place], [place]. [place] [phone].   [facility]ed [facility], they stated that the patient told them he does not have transportation and would call them back once he spoke with someone in this office. Per Rep she states everything is ready to go on their end they just need to know whether the patient is going to use them or not.

## 2018-05-28 NOTE — Telephone Encounter (Signed)
Called and spoke with the patient he states he has spoke with [facility] and it would covered so he has asked that I  Send in the order to [facility] I have done so nothing further needed at this time.

## 2018-05-28 NOTE — Addendum Note (Signed)
Addended by: Madolyn Frieze on: 05/28/2018 09:31 AM   Modules accepted: Orders

## 2018-07-06 ENCOUNTER — Ambulatory Visit (INDEPENDENT_AMBULATORY_CARE_PROVIDER_SITE_OTHER): Payer: MEDICARE | Admitting: Medical

## 2018-07-06 ENCOUNTER — Encounter: Payer: Self-pay | Admitting: Medical

## 2018-07-06 VITALS — BP 114/63 | HR 57 | Temp 97.6°F | Resp 14 | Ht 64.0 in | Wt 190.0 lb

## 2018-07-06 DIAGNOSIS — M25512 Pain in left shoulder: Secondary | ICD-10-CM

## 2018-07-06 DIAGNOSIS — R0789 Other chest pain: Secondary | ICD-10-CM

## 2018-07-06 DIAGNOSIS — M791 Myalgia, unspecified site: Secondary | ICD-10-CM

## 2018-07-06 DIAGNOSIS — M2550 Pain in unspecified joint: Secondary | ICD-10-CM | POA: Diagnosis not present

## 2018-07-06 DIAGNOSIS — M25511 Pain in right shoulder: Secondary | ICD-10-CM

## 2018-07-06 MED ORDER — FLUTICASONE PROPIONATE 50 MCG/ACT NA SUSP: 2.0000 | Freq: Every day | NASAL | 1 refills | Status: DC

## 2018-07-06 NOTE — Progress Notes (Signed)
Subjective:    Patient ID: Brian [name], male    DOB: 14-Aug-1945, 73 y.o.   MRN: [id number]  HPI  Pt has diffuse body aches for months. He shows me picture showing pain in frontal region, mid t-spine area, bilateral ac/shoulder joints, bilateral flank area pain, bilateral upper bicep/tricep pain, bilateral groin area pain, thigh pain, knee pain, pretibial pain and rt side lateral calf pain. Pain present for moths.  Pt has had lower back surgery before. Pt surgeon who did procedure referred him to Dr. Evelene Croon who is a pain specialist. He has appointment this Thursday.  He also reports occasional lower mid chest pain. Not as frequent as other body aches. Last time had was day and half ago. No jaw pain, no left shoulder pain or sob associated with transient rare mid chest pain. Pt has 3 vessel coronary artery disease by CT in past. When has pain will last 5 minutes at most. Not associated with activity. Feels more with movement. Pt had nuclear stress test in 2017.  The above area hurt every day. Pt had inflammation blood test done in the past.  Pt expresses that does not want any new medications. He states meds too expensive.  Frontal sinus pressure comes and goes. Will have one day then goes away for 3 days.No ha reported or gross motor/sensory deficits. No obvios nasal congestion.    Review of Systems  Constitutional: Negative for chills, fatigue and fever.  HENT: Positive for congestion. Negative for facial swelling, postnasal drip, sinus pressure and sinus pain.   Respiratory: Negative for cough, chest tightness, shortness of breath and wheezing.   Cardiovascular: Negative for chest pain and palpitations.       Atypical chest pain.  Gastrointestinal: Negative for abdominal pain.  Musculoskeletal: Negative for back pain.       See hpi.  Skin: Negative for rash.  Neurological: Negative for syncope, facial asymmetry and weakness.  Hematological: Negative for adenopathy. Does not  bruise/bleed easily.  Psychiatric/Behavioral: Negative for decreased concentration. The patient is not nervous/anxious and is not hyperactive.     Past Medical History:  Diagnosis Date  . Atypical chest pain     Negative cardiac catheterization 2014  . Barrett's esophagus   . 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 Blind right eye   . 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 Chicken pox   . Chronic back pain   . Dislocation, jaw 1960s  . Edema    Left Leg  . Essential hypertension, benign   . GERD (gastroesophageal reflux disease)   . [place] measles   . Heart attack ([place])   . Hyperlipidemia   . Measles   . Mumps   . Osteopenia 11.12.10   bone density test  . Personal history of other diseases of digestive system 08.10.12   Gastric ulcer     Social History   Socioeconomic History  . Marital status: Single    Spouse name: Not on file  . Number of children: 0  . Years of education: Not on file  . Highest education level: Not on file  Occupational History  . Not on file  Social Needs  . Financial resource strain: Not on file  . Food insecurity:    Worry: Not on file    Inability: Not on file  . Transportation needs:    Medical: Not on file    Non-medical: Not on file  Tobacco Use  . Smoking status: Former Smoker  Packs/day: 0.25    Years: 2.00    Pack years: 0.50    Types: Cigarettes    Last attempt to quit: 06/02/1966    Years since quitting: 52.1  . Smokeless tobacco: Never Used  Substance and Sexual Activity  . Alcohol use: No    Alcohol/week: 0.0 standard drinks  . Drug use: No  . Sexual activity: Never  Lifestyle  . Physical activity:    Days per week: Not on file    Minutes per session: Not on file  . Stress: Not on file  Relationships  . Social connections:    Talks on phone: Not on file    Gets together: Not on file    Attends religious service: Not on file    Active member of club or organization: Not on file    Attends  meetings of clubs or organizations: Not on file    Relationship status: Not on file  . Intimate partner violence:    Fear of current or ex partner: Not on file    Emotionally abused: Not on file    Physically abused: Not on file    Forced sexual activity: Not on file  Other Topics Concern  . Not on file  Social History Narrative  . Not on file    Past Surgical History:  Procedure Laterality Date  . ABDOMINAL HERNIA REPAIR  1972  . APPENDECTOMY  1973  . BACK SURGERY  10/28/2017  . CATARACT EXTRACTION, BILATERAL  2005  . CHOLECYSTECTOMY  08/2016  . COLONOSCOPY WITH ESOPHAGOGASTRODUODENOSCOPY (EGD)     about 2016. [facility]  . ESOPHAGOGASTRODUODENOSCOPY  06.2011  . EYE SURGERY  1972   Bilateral Lens Replacement  . FACIAL RECONSTRUCTION SURGERY  2008  . LEFT HEART CATH  02.17.2014  . [place], 2008   Dislocated Jaw  . PARS PLANA VITRECTOMY W/ REPAIR OF MACULAR HOLE  2005   macular hole repair, right eye  . PARS PLANA VITRECTOMY W/ REPAIR OF MACULAR HOLE  2010   mechanical, left eye  . PROSTATE BIOPSY  [place] 2017   w/ Dr. [place] Crane  . WISDOM TOOTH EXTRACTION      Family History  Problem Relation Age of Onset  . Alzheimer's disease Mother        Deceased  . Diabetes Maternal Grandmother   . Heart attack Maternal Grandmother   . Prostate cancer Maternal Uncle   . Diabetes Maternal Uncle   . Healthy Son   . Cataracts Maternal Aunt   . Colon cancer Neg Hx   . Esophageal cancer Neg Hx     Allergies  Allergen Reactions  . Gabapentin Other (See Comments)    Feel "weird"; Nausea; Weakness; Syncope    Current Outpatient Medications on File Prior to Visit  Medication Sig Dispense Refill  . aspirin (ASPIR-81) 81 MG EC tablet Take 1 tablet by mouth daily.    Marland Kitchen b complex vitamins tablet Take 1 tablet by mouth daily.    . benzonatate (TESSALON) 100 MG capsule Take 1 capsule (100 mg total) by mouth 3 (three) times daily as needed. 30 capsule 0  . Calcium  Carbonate-Vitamin D (CALCIUM-VITAMIN D3 PO) Take by mouth daily.    . hydrochlorothiazide (MICROZIDE) 12.5 MG capsule Take 1 capsule (12.5 mg total) by mouth daily. 90 capsule 3  . linaclotide (LINZESS) 145 MCG CAPS capsule Take 1 capsule (145 mcg total) by mouth daily before breakfast. 30 capsule 0  . pantoprazole (PROTONIX) 40 MG  tablet Take 1 tablet (40 mg total) by mouth 2 (two) times daily. 180 tablet 3  . simvastatin (ZOCOR) 40 MG tablet Take 1 tablet (40 mg total) by mouth every evening. 90 tablet 3  . tamsulosin (FLOMAX) 0.4 MG CAPS capsule Take 2 capsules (0.8 mg total) by mouth daily. 90 capsule 1  . timolol (TIMOPTIC) 0.5 % ophthalmic solution Place 1 drop into the left eye daily. 10 mL 12  . traMADol (ULTRAM) 50 MG tablet 1 tab po q 8 hours prn pain 15 tablet 0   No current facility-administered medications on file prior to visit.     BP 114/63 (BP Location: Right Arm, Patient Position: Sitting, Cuff Size: Normal)   Pulse (!) 57   Temp 97.6 F (36.4 C)   Resp 14   Ht 5\' 4"  (1.626 m)   Wt 190 lb (86.2 kg)   SpO2 97%   BMI 32.61 kg/m       Objective:   Physical Exam  General Mental Status- Alert. General Appearance- Not in acute distress.   Skin General: Color- Normal Color. Moisture- Normal Moisture.  Neck Carotid Arteries- Normal color. Moisture- Normal Moisture. No carotid bruits. No JVD.  Chest and Lung Exam Auscultation: Breath Sounds:-Normal.  Cardiovascular Auscultation:Rythm- Regular. Murmurs & Other Heart Sounds:Auscultation of the heart reveals- No Murmurs.  Abdomen Inspection:-Inspeection Normal. Palpation/Percussion:Note:No mass. Palpation and Percussion of the abdomen reveal- Non Tender, Non Distended + BS, no rebound or guarding.  Neurologic Cranial Nerve exam:- CN III-XII intact(No nystagmus), symmetric smile. Strength:- 5/5 equal and symmetric strength both upper and lower extremities.  Anterior chest- faint lower sternal area tendenress  to palpation.  msk exam- bilateral shoulder pain on palpation and rom. Lower para thoracic area tenderness to palpation. Mild lower rib area tenderness to palpation. Elbows- bilateral no pain on palpation. Knees- mild tenderness to palpation. Lumbar- mild pain on movement and faint on mid lspine palpation  heent- faint frontal sinus pressure.(otherwise negative)      Assessment & Plan:  You do have history of various arthralgias and myalgias.  Work-up of these areas in the past at times did show a rotator cuff injury, transient synovitis of the hip and lumbar disease which required surgery.  However your persisting diffuse nature of pain does make me think that you might have underlying component of possible fibromyalgia.  Inflammatory lab studies in the past have been negative.  You now have appointment with pain specialist this coming Thursday.  This referral was placed by your back surgeon.  Will see what pain specialist thinks regarding source of your various regions of pain.  Please get them to fax [place] the consult note.  Getting note might take 1 to 2 weeks or so.  So if you could also call him give me an update as to what medication I gave you and what diagnosis was given.  You do have some random transient lower region chest pain.  Pain that you describe does not seem cardiac in nature.  He stated pain occurs oftentimes with movement of the chest wall or twisting her thorax.  Not worsened with activity.  No associated cardiac signs and symptoms.  Your 2017 nuclear stress test did not show any definite ischemia type findings.  However your CT of the chest does show some three-vessel disease.  Your EKG shows sinus rhythm today with mild bradycardia.  No ischemic changes.  I do want you to be careful with any recurrent chest pain.  If you have  any that exceeds 5 minutes and appears cardiac in nature as I described today then would recommend ED evaluation.  Also if you have any worsening/changing type  of chest pain would refer you back to cardiologist.  For sinus pressure rx flonase.  Follow-up in 3 to 4 weeks or as needed.  40 minutes spent with pt. 50% of time spent counseling him on plan going forward. On fibromyalgia on top of potential osteoarthritis type complaints, atypical chest pain, history of CAD and on sinus pressure.   Mackie Pai, PA-C

## 2018-07-06 NOTE — Patient Instructions (Addendum)
You do have history of various arthralgias and myalgias.  Work-up of these areas in the past at times did show a rotator cuff injury, transient synovitis of the hip and lumbar disease which required surgery.  However your persisting diffuse nature of pain does make me think that you might have underlying component of possible fibromyalgia.  Inflammatory lab studies in the past have been negative.  You now have appointment with pain specialist this coming Thursday.  This referral was placed by your back surgeon.  Will see what pain specialist thinks regarding source of your various regions of pain.  Please get them to fax [place] the consult note.  Getting note might take 1 to 2 weeks or so.  So if you could also call him give me an update as to what medication I gave you and what diagnosis was given.  You do have some random transient lower region chest pain.  Pain that you describe does not seem cardiac in nature.  He stated pain occurs oftentimes with movement of the chest wall or twisting her thorax.  Not worsened with activity.  No associated cardiac signs and symptoms.  Your 2017 nuclear stress test did not show any definite ischemia type findings.  However your CT of the chest does show some three-vessel disease.  Your EKG shows sinus rhythm today with mild bradycardia.  No ischemic changes.  I do want you to be careful with any recurrent chest pain.  If you have any that exceeds 5 minutes and appears cardiac in nature as I described today then would recommend ED evaluation.  Also if you have any worsening/changing type of chest pain would refer you back to cardiologist.  For sinus pressure rx flonase.  Follow-up in 3 to 4 weeks or as needed.

## 2018-07-15 ENCOUNTER — Ambulatory Visit (INDEPENDENT_AMBULATORY_CARE_PROVIDER_SITE_OTHER): Payer: MEDICARE | Admitting: Pulmonary Disease

## 2018-07-15 ENCOUNTER — Encounter: Payer: Self-pay | Admitting: Pulmonary Disease

## 2018-07-15 VITALS — BP 118/72 | HR 68 | Ht 64.0 in | Wt 189.0 lb

## 2018-07-15 DIAGNOSIS — J849 Interstitial pulmonary disease, unspecified: Secondary | ICD-10-CM

## 2018-07-15 DIAGNOSIS — K22719 Barrett's esophagus with dysplasia, unspecified: Secondary | ICD-10-CM

## 2018-07-15 DIAGNOSIS — G4733 Obstructive sleep apnea (adult) (pediatric): Secondary | ICD-10-CM | POA: Diagnosis not present

## 2018-07-15 DIAGNOSIS — Z789 Other specified health status: Secondary | ICD-10-CM

## 2018-07-15 DIAGNOSIS — I2510 Atherosclerotic heart disease of native coronary artery without angina pectoris: Secondary | ICD-10-CM

## 2018-07-15 NOTE — Progress Notes (Signed)
[facility]  Chief Complaint  Patient presents with  . Follow-up    Pt is having hard time using the cpap machine, the mask not fitting well. Pt is still having hard time sleeping.    Constitutional:  BP 118/72 (BP Location: Left Arm, Cuff Size: Normal)   Pulse 68   Ht _0  (1.626 m)   Wt 189 lb (85.7 kg)   SpO2 94%   BMI 32.44 kg/m   Past Medical History:  GERD, Barrett's Esophagus, HLD, Macular degeneration, Glaucoma, Sleep apnea, BPH, Overactive bladder, Vertigo, PUD, HTN, Neuropathy, Back pain, CAD  Brief Summary:  Brian [name] is a 73 y.o. male former smoker with obstructive sleep apnea, and interstitial lung disease in the setting of reflux.  Followed in [facility] office.  He has been using CPAP.  Has nasal mask.  Has to make mask tight to prevent leak, but then gets sore across bridge of his nose.  Not having sinus congestion, sore throat, dry mouth, or aerophagia.  Feels like CPAP has helped, but main issue is mask fit.  Serology from December 2019 was negative.  Not having as much cough.  Not having wheeze, fever, skin rash, joint swelling, or hemoptysis.  Did have coronary calcification in CT chest.  Gets intermittent chest discomfort.  This happens randomly and resolves spontaneously after few seconds.  He has discussed this with his PCP.  He is followed by Dr. Lyndel Safe with GI for reflux.  This seems stable at present.  Physical Exam:   Appearance - well kempt   ENMT - no sinus tenderness, no nasal discharge, no oral exudate, MP 3, wears dentures  Neck - no masses, trachea midline, no thyromegaly, no elevation in JVP  Respiratory - normal appearance of chest wall, normal respiratory effort w/o accessory muscle use, no dullness on percussion, no wheezing or rales  CV - s1s2 regular rate and rhythm, no murmurs, no peripheral edema, radial pulses symmetric  GI - soft, non tender  Lymph - no adenopathy noted in neck and  axillary areas  MSK - normal gait  Ext - no cyanosis, clubbing, or joint inflammation noted  Skin - no rashes, lesions, or ulcers  Neuro - normal strength, oriented x 3  Psych - normal mood and affect   Assessment/Plan:   Interstitial lung disease. - likely related to reflux - serology negative - monitor clinically  Obstructive sleep apnea. - he has been compliant with CPAP and has benefit from therapy - main issue is with mask fit; will have his DME work with him to get better fitting mask - continue auto CPAP  Coronary calcifications on CT chest with intermittent chest discomfort. - advised him to f/u with his PCP if symptoms progress  GERD with hx of Barrett's Esophagus. - followed by Dr. Lyndel Safe with GI in [facility]   Patient Instructions  Will arrange for CPAP mask refitting  Follow up in 6 months  A total of  26 minutes were spent face to face with the patient and more than half of that time involved counseling or coordination of care.   Chesley Mires, MD [facility] Pager: [phone] 07/15/2018, 9:45 AM  Flow Sheet     Pulmonary tests:  Serology 02/15/18 >> HLA B27, ANA, RF negative HRCT chest 04/05/18 >> 4 mm nodule RLL (benign), minimal patchy subpleural reticulation and GGO b/l, small HH PFY 04/20/18 >> FEV1 2.15 (101%), FEV1% 85, TLC 4.68 (80%), DLCO 69%, +  BD Serology 05/06/18 >> RF, SCL 70, SSA/SSB negative  Sleep tests:  HST 04/20/18 >> AHI 35.5, SpO2 78%. Auto CPAP 06/14/18 to 07/13/18 >> used on 26 of 30 nights with average 5 hrs 47 min.  Average AHI 8.3 with median CPAP 7 and 95 th percentile CPAP 10 cm H2O  Cardiac tests:  LHC 07/19/12 >> non significant CAD, EF 60% Echo 11/27/15 >> normal EF  Medications:   Allergies as of 07/15/2018      Reactions   Gabapentin Other (See Comments)   Feel "weird"; Nausea; Weakness; Syncope      Medication List       Accurate as of July 15, 2018  9:45 AM. Always use your most  recent med list.        ASPIRIN 81 81 MG EC tablet Generic drug:  aspirin Take 1 tablet by mouth daily.   b complex vitamins tablet Take 1 tablet by mouth daily.   benzonatate 100 MG capsule Commonly known as:  TESSALON Take 1 capsule (100 mg total) by mouth 3 (three) times daily as needed.   CALCIUM-VITAMIN D3 PO Take by mouth daily.   fluticasone 50 MCG/ACT nasal spray Commonly known as:  FLONASE Place 2 sprays into both nostrils daily.   hydrochlorothiazide 12.5 MG capsule Commonly known as:  MICROZIDE Take 1 capsule (12.5 mg total) by mouth daily.   linaclotide 145 MCG Caps capsule Commonly known as:  LINZESS Take 1 capsule (145 mcg total) by mouth daily before breakfast.   pantoprazole 40 MG tablet Commonly known as:  PROTONIX Take 1 tablet (40 mg total) by mouth 2 (two) times daily.   simvastatin 40 MG tablet Commonly known as:  ZOCOR Take 1 tablet (40 mg total) by mouth every evening.   tamsulosin 0.4 MG Caps capsule Commonly known as:  FLOMAX Take 2 capsules (0.8 mg total) by mouth daily.   timolol 0.5 % ophthalmic solution Commonly known as:  TIMOPTIC Place 1 drop into the left eye daily.   traMADol 50 MG tablet Commonly known as:  ULTRAM 1 tab po q 8 hours prn pain       Past Surgical History:  He  has a past surgical history that includes Appendectomy (1973); Abdominal hernia repair (1972); Eye surgery (1972); Mandible surgery (1968, 2008); Wisdom tooth extraction; Cataract extraction, bilateral (2005); Pars plana vitrectomy w/ repair of macular hole (2005); Pars plana vitrectomy w/ repair of macular hole (2010); Esophagogastroduodenoscopy (37.9024); Facial reconstruction surgery (2008); Left heart cath (02.17.2014); Prostate biopsy [place] 2017); Cholecystectomy (08/2016); Back surgery (10/28/2017); and Colonoscopy with esophagogastroduodenoscopy (egd).  Family History:  His family history includes Alzheimer's disease in his mother; Cataracts in his  maternal aunt; Diabetes in his maternal grandmother and maternal uncle; Healthy in his son; Heart attack in his maternal grandmother; Prostate cancer in his maternal uncle.  Social History:  He  reports that he quit smoking about 52 years ago. His smoking use included cigarettes. He has a 0.50 pack-year smoking history. He has never used smokeless tobacco. He reports that he does not drink alcohol or use drugs.

## 2018-07-15 NOTE — Patient Instructions (Signed)
Will arrange for CPAP mask refitting  Follow up in 6 months 

## 2018-09-20 ENCOUNTER — Other Ambulatory Visit: Payer: Self-pay | Admitting: Medical

## 2018-09-23 ENCOUNTER — Telehealth: Payer: Self-pay | Admitting: Medical

## 2018-09-23 ENCOUNTER — Emergency Department (HOSPITAL_BASED_OUTPATIENT_CLINIC_OR_DEPARTMENT_OTHER)
Admission: EM | Admit: 2018-09-23 | Discharge: 2018-09-23 | Disposition: A | Discharge status: Home / Self Care | Payer: MEDICARE | Attending: Emergency Medicine | Admitting: Emergency Medicine

## 2018-09-23 ENCOUNTER — Emergency Department (HOSPITAL_BASED_OUTPATIENT_CLINIC_OR_DEPARTMENT_OTHER): Payer: MEDICARE

## 2018-09-23 ENCOUNTER — Other Ambulatory Visit: Payer: Self-pay

## 2018-09-23 ENCOUNTER — Encounter (HOSPITAL_BASED_OUTPATIENT_CLINIC_OR_DEPARTMENT_OTHER): Payer: Self-pay | Admitting: Emergency Medicine

## 2018-09-23 DIAGNOSIS — R0789 Other chest pain: Secondary | ICD-10-CM

## 2018-09-23 DIAGNOSIS — R079 Chest pain, unspecified: Secondary | ICD-10-CM | POA: Diagnosis present

## 2018-09-23 DIAGNOSIS — I10 Essential (primary) hypertension: Secondary | ICD-10-CM | POA: Insufficient documentation

## 2018-09-23 DIAGNOSIS — Z7982 Long term (current) use of aspirin: Secondary | ICD-10-CM | POA: Diagnosis not present

## 2018-09-23 DIAGNOSIS — Z87891 Personal history of nicotine dependence: Secondary | ICD-10-CM | POA: Insufficient documentation

## 2018-09-23 DIAGNOSIS — Z79899 Other long term (current) drug therapy: Secondary | ICD-10-CM | POA: Insufficient documentation

## 2018-09-23 LAB — COMPREHENSIVE METABOLIC PANEL
ALT: 19 U/L (ref 0–44)
AST: 21 U/L (ref 15–41)
Albumin: 3.7 g/dL (ref 3.5–5.0)
Alkaline Phosphatase: 57 U/L (ref 38–126)
Anion gap: 4 — ABNORMAL LOW (ref 5–15)
BUN: 9 mg/dL (ref 8–23)
CO2: 25 mmol/L (ref 22–32)
Calcium: 8.7 mg/dL — ABNORMAL LOW (ref 8.9–10.3)
Chloride: 108 mmol/L (ref 98–111)
Creatinine, Ser: 1.07 mg/dL (ref 0.61–1.24)
GFR calc Af Amer: >60 mL/min (ref >60)
GFR calc non Af Amer: >60 mL/min (ref >60)
Glucose, Bld: 110 mg/dL — ABNORMAL HIGH (ref 70–99)
Potassium: 3.3 mmol/L — ABNORMAL LOW (ref 3.5–5.1)
Sodium: 137 mmol/L (ref 135–145)
Total Bilirubin: 1.5 mg/dL — ABNORMAL HIGH (ref 0.3–1.2)
Total Protein: 6.5 g/dL (ref 6.5–8.1)

## 2018-09-23 LAB — CBC
HCT: 42.2 % (ref 39.0–52.0)
Hemoglobin: 13.1 g/dL (ref 13.0–17.0)
MCH: 27.6 pg (ref 26.0–34.0)
MCHC: 31 g/dL (ref 30.0–36.0)
MCV: 88.8 fL (ref 80.0–100.0)
Platelets: 278 10*3/uL (ref 150–400)
RBC: 4.75 MIL/uL (ref 4.22–5.81)
RDW: 13.2 % (ref 11.5–15.5)
WBC: 4.5 10*3/uL (ref 4.0–10.5)
nRBC: 0 % (ref 0.0–0.2)

## 2018-09-23 LAB — TROPONIN I
Troponin I: <0.03 ng/mL (ref <0.03)
Troponin I: <0.03 ng/mL (ref <0.03)

## 2018-09-23 LAB — LIPASE, BLOOD: Lipase: 32 U/L (ref 11–51)

## 2018-09-23 MED ORDER — SUCRALFATE 1 GM/10ML PO SUSP: 1.0000 g | Freq: Three times a day (TID) | ORAL | 0 refills | Status: DC

## 2018-09-23 MED ORDER — LIDOCAINE VISCOUS HCL 2 % MT SOLN
15.0000 mL | Freq: Once | OROMUCOSAL | Status: AC
  Administered 2018-09-23: 15 mL via ORAL
  Filled 2018-09-23: qty 15

## 2018-09-23 MED ORDER — SUCRALFATE 1 G PO TABS: 1.0000 g | ORAL_TABLET | Freq: Three times a day (TID) | ORAL | 0 refills | Status: DC

## 2018-09-23 MED ORDER — ALUM & MAG HYDROXIDE-SIMETH 200-200-20 MG/5ML PO SUSP
30.0000 mL | Freq: Once | ORAL | Status: AC
  Administered 2018-09-23: 30 mL via ORAL
  Filled 2018-09-23: qty 30

## 2018-09-23 MED FILL — SUCRALFATE 1 GM TABLET: 1 | 23 days supply | Qty: 90 | Fill #0

## 2018-09-23 NOTE — Telephone Encounter (Signed)
Opened to review 

## 2018-09-23 NOTE — ED Triage Notes (Signed)
Midcentral chest pain for 2 days.  Non radiating.  Some sob, no diaphoresis.

## 2018-09-23 NOTE — ED Provider Notes (Signed)
[facility] EMERGENCY DEPARTMENT Provider Note   CSN: [id number] Arrival date & time: 09/23/18  3748    History   Chief Complaint Chief Complaint  Patient presents with  . Chest Pain    HPI Brian [name] is a 73 y.o. male.     Pt presents to the ED today with CP.  He said it started 2 days ago.  He points to his epigastrium when shows me where his pain is located.  He said it feels "tight."  He has not taken anything other than his regular medications for pain.  He feels a little sob with exertion, but does not feel sob now.  He denies nausea.  No f/c.  Occasional cough, but that has been going on for a long time.  Some swelling in both legs.  Last cath 2014 nl.     Past Medical History:  Diagnosis Date  . Atypical chest pain     Negative cardiac catheterization 2014  . Barrett's esophagus   . 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 Blind right eye   . 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 Chicken pox   . Chronic back pain   . Dislocation, jaw 1960s  . Edema    Left Leg  . Essential hypertension, benign   . GERD (gastroesophageal reflux disease)   . [place] measles   . Heart attack ([place])   . Hyperlipidemia   . Measles   . Mumps   . Osteopenia 11.12.10   bone density test  . Personal history of other diseases of digestive system 08.10.12   Gastric ulcer    Patient Active Problem List   Diagnosis Date Noted  . Right shoulder pain 01/30/2017  . Left shoulder pain 05/19/2016  . Urinary hesitancy 10/18/2014  . Trapezius muscle spasm 10/18/2014  . Idiopathic neuropathy 10/18/2014  . Allergic reaction 09/28/2014  . Hyperglycemia 09/28/2014  . Acute bacterial bronchitis 08/18/2014  . Hematuria 08/18/2014  . Screening, ischemic heart disease 06/16/2014  . Lumbar radiculopathy, chronic 05/19/2014  . Midline thoracic back pain 03/24/2014  . BPPV (benign paroxysmal positional vertigo) 01/20/2014  . Right-sided low back pain with  right-sided sciatica 12/21/2013  . Lightheadedness 09/22/2013  . Porokeratosis 02/25/2013  . Pain in joint, ankle and foot 02/25/2013  . Lumbosacral pain 02/15/2013  . Abdominal pain, epigastric 02/15/2013  . Acid reflux 02/15/2013  . Hearing loss 02/15/2013  . Preventive measure 02/15/2013  . Chest pain 07/19/2012  . Bradycardia, sinus 07/19/2012  . Borderline glaucoma 03/15/2012  . 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 Dislocated inferior maxilla 01/10/2011  . H/O gastric ulcer 01/10/2011  . Back pain, chronic 11/21/2010    Past Surgical History:  Procedure Laterality Date  . ABDOMINAL HERNIA REPAIR  1972  . APPENDECTOMY  1973  . BACK SURGERY  10/28/2017  . CATARACT EXTRACTION, BILATERAL  2005  . CHOLECYSTECTOMY  08/2016  . COLONOSCOPY WITH ESOPHAGOGASTRODUODENOSCOPY (EGD)     about 2016. [facility]  . ESOPHAGOGASTRODUODENOSCOPY  06.2011  . EYE SURGERY  1972   Bilateral Lens Replacement  . FACIAL RECONSTRUCTION SURGERY  2008  . LEFT HEART CATH  02.17.2014  . [place], 2008   Dislocated Jaw  . PARS PLANA VITRECTOMY W/ REPAIR OF MACULAR HOLE  2005   macular hole repair, right eye  . PARS PLANA VITRECTOMY W/ REPAIR OF MACULAR HOLE  2010   mechanical, left eye  . PROSTATE BIOPSY  [place] 2017   w/ Dr. [place] Crane  . WISDOM TOOTH EXTRACTION          Home Medications    Prior to Admission medications   Medication Sig Start Date End Date Taking? Authorizing Provider  aspirin (ASPIR-81) 81 MG EC tablet Take 1 tablet by mouth daily.    [provider]  b complex vitamins tablet Take 1 tablet by mouth daily.    [provider]  benzonatate (TESSALON) 100 MG capsule Take 1 capsule (100 mg total) by mouth 3 (three) times daily as needed. 09/15/17   Saguier, Percell Miller, PA-C  Calcium Carbonate-Vitamin D (CALCIUM-VITAMIN D3 PO) Take by mouth daily.    [provider]  fluticasone (FLONASE) 50 MCG/ACT nasal spray  Place 2 sprays into both nostrils daily. 07/06/18   Saguier, Percell Miller, PA-C  hydrochlorothiazide (MICROZIDE) 12.5 MG capsule Take 1 capsule (12.5 mg total) by mouth daily. 09/21/18   Saguier, Percell Miller, PA-C  linaclotide [facility]) 145 MCG CAPS capsule Take 1 capsule (145 mcg total) by mouth daily before breakfast. 03/29/18   Saguier, Percell Miller, PA-C  pantoprazole (PROTONIX) 40 MG tablet Take 1 tablet (40 mg total) by mouth 2 (two) times daily. 09/21/18   Saguier, Percell Miller, PA-C  simvastatin (ZOCOR) 40 MG tablet Take 1 tablet (40 mg total) by mouth every evening. 09/21/18   Saguier, Percell Miller, PA-C  sucralfate (CARAFATE) 1 GM/10ML suspension Take 10 mLs (1 g total) by mouth 4 (four) times daily -  with meals and at bedtime. 09/23/18   Isla Pence, MD  tamsulosin (FLOMAX) 0.4 MG CAPS capsule Take 2 capsules (0.8 mg total) by mouth daily. 11/13/16   Shelda Pal, DO  timolol (TIMOPTIC) 0.5 % ophthalmic solution Place 1 drop into the left eye daily. 10/03/16   Saguier, Percell Miller, PA-C  traMADol Veatrice Bourbon) 50 MG tablet 1 tab po q 8 hours prn pain 06/16/17   Saguier, Percell Miller, PA-C    Family History Family History  Problem Relation Age of Onset  . Alzheimer's disease Mother        Deceased  . Diabetes Maternal Grandmother   . Heart attack Maternal Grandmother   . Prostate cancer Maternal Uncle   . Diabetes Maternal Uncle   . Healthy Son   . Cataracts Maternal Aunt   . Colon cancer Neg Hx   . Esophageal cancer Neg Hx     Social History Social History   Tobacco Use  . Smoking status: Former Smoker    Packs/day: 0.25    Years: 2.00    Pack years: 0.50    Types: Cigarettes    Last attempt to quit: 06/02/1966    Years since quitting: 52.3  . Smokeless tobacco: Never Used  Substance Use Topics  . Alcohol use: No    Alcohol/week: 0.0 standard drinks  . Drug use: No     Allergies   Gabapentin   Review of Systems Review of Systems  Cardiovascular: Positive for chest pain and leg swelling.      Physical Exam Updated Vital Signs BP 132/81   Pulse (!) 56   Temp 98.3 F (36.8 C) (Oral)   Resp 20   SpO2 97%   Physical Exam Vitals signs and nursing note reviewed.  Constitutional:      Appearance: He is well-developed.  HENT:     Head: Normocephalic and atraumatic.  Eyes:     Comments: Prosthetic right eye  Cardiovascular:     Rate and Rhythm: Normal rate and regular rhythm.  Heart sounds: Normal heart sounds.  Pulmonary:     Effort: Pulmonary effort is normal.     Breath sounds: Normal breath sounds.  Abdominal:     Palpations: Abdomen is soft.  Musculoskeletal: Normal range of motion.     Right lower leg: Edema present.     Left lower leg: Edema present.  Skin:    General: Skin is warm.     Capillary Refill: Capillary refill takes less than 2 seconds.  Neurological:     General: No focal deficit present.     Mental Status: He is alert and oriented to person, place, and time.  Psychiatric:        Mood and Affect: Mood normal.        Behavior: Behavior normal.      ED Treatments / Results  Labs (all labs ordered are listed, but only abnormal results are displayed) Labs Reviewed  COMPREHENSIVE METABOLIC PANEL - Abnormal; Notable for the following components:      Result Value   Potassium 3.3 (*)    Glucose, Bld 110 (*)    Calcium 8.7 (*)    Total Bilirubin 1.5 (*)    Anion gap 4 (*)    All other components within normal limits  CBC  TROPONIN I  LIPASE, BLOOD  TROPONIN I    EKG EKG Interpretation  Date/Time:  Thursday September 23 2018 09:34:08 EDT Ventricular Rate:  55 PR Interval:    QRS Duration: 93 QT Interval:  422 QTC Calculation: 404 R Axis:   5 Text Interpretation:  Sinus arrhythmia Low voltage, precordial leads Abnormal R-wave progression, early transition Borderline T abnormalities, anterior leads Baseline wander in lead(s) II III aVF No significant change since last tracing Confirmed by Isla Pence [phone]) on 09/23/2018 9:43:44 AM    Radiology Dg Chest 2 View  Result Date: 09/23/2018 CLINICAL DATA:  Chest pain and shortness of breath. EXAM: CHEST - 2 VIEW COMPARISON:  03/29/2018 FINDINGS: The heart size and mediastinal contours are within normal limits. Bibasilar atelectasis present. There is no evidence of pulmonary edema, consolidation, pneumothorax, nodule or pleural fluid. The visualized skeletal structures are unremarkable. IMPRESSION: Bibasilar atelectasis.  No active cardiopulmonary disease. Electronically Signed   By: Aletta Edouard M.D.   On: 09/23/2018 09:58    Procedures Procedures (including critical care time)  Medications Ordered in ED Medications  alum & mag hydroxide-simeth (MAALOX/MYLANTA) 200-200-20 MG/5ML suspension 30 mL (30 mLs Oral Given 09/23/18 0950)    And  lidocaine (XYLOCAINE) 2 % viscous mouth solution 15 mL (15 mLs Oral Given 09/23/18 0950)     Initial Impression / Assessment and Plan / ED Course  I have reviewed the triage vital signs and the nursing notes.  Pertinent labs & imaging results that were available during my care of the patient were reviewed by me and considered in my medical decision making (see chart for details).       CP gone with Gi cocktail.  Sx have been going on for a few days and he has 2 negative troponins with a negative EKG.  Pain more in epigastrium c/w GERD and his Barrett's esophagus.  It looks like he's had intermittent cp for a few months.  No cardiac work up for a few years, so I will have him see a cardiologist.  This building is convenient, so he will be given the number for the cards doctors here.  He is given a rx for carafate to see if that will help ease  his pain.  Pt is stable for d/c.  Return if worse.  Final Clinical Impressions(s) / ED Diagnoses   Final diagnoses:  Atypical chest pain    ED Discharge Orders         Ordered    sucralfate (CARAFATE) 1 GM/10ML suspension  3 times daily with meals & bedtime     09/23/18 1301            Isla Pence, MD 09/23/18 1301

## 2018-10-08 ENCOUNTER — Ambulatory Visit (INDEPENDENT_AMBULATORY_CARE_PROVIDER_SITE_OTHER): Payer: MEDICARE | Admitting: Medical

## 2018-10-08 ENCOUNTER — Other Ambulatory Visit: Payer: Self-pay

## 2018-10-08 ENCOUNTER — Encounter: Payer: Self-pay | Admitting: Medical

## 2018-10-08 VITALS — Wt 195.0 lb

## 2018-10-08 DIAGNOSIS — K2100 Gastro-esophageal reflux disease with esophagitis, without bleeding: Secondary | ICD-10-CM

## 2018-10-08 DIAGNOSIS — I2510 Atherosclerotic heart disease of native coronary artery without angina pectoris: Secondary | ICD-10-CM | POA: Diagnosis not present

## 2018-10-08 DIAGNOSIS — R059 Cough, unspecified: Secondary | ICD-10-CM

## 2018-10-08 DIAGNOSIS — R05 Cough: Secondary | ICD-10-CM | POA: Diagnosis not present

## 2018-10-08 NOTE — Patient Instructions (Signed)
You do appear to have gerd type symptoms. By severe description I do think you have likely some esophagitis. Get back on your protonix and continue carafate. If symptoms persist despite protonix then can add famotidine.  I do think your cough is related to gerd.  If you have any new symptoms that seem to be in chest/cardiac then return to ED.  Your pedal edema is likely dependent edema since your cxr was clear. Continue hctz and elevated legs. If swelling worsens.  Follow up in 2 weeks virtual visit or as needed

## 2018-10-08 NOTE — Progress Notes (Signed)
Subjective:    Patient ID: Brian [name], male    DOB: Apr 03, 1946, 73 y.o.   MRN: [id number]  HPI Virtual Visit via Video Note  I connected with Brian [name] on 10/08/18 at 11:00 AM EDT by a video enabled telemedicine application and verified that I am speaking with the correct person using two identifiers.  Location: Patient: home Provider: home  Need for virtual visit due to pandemic. Pt bp machine not working. Gave him error reading.   I discussed the limitations of evaluation and management by telemedicine and the availability of in person appointments. The patient expressed understanding and agreed to proceed.  History of Present Illness:   Pt states that his stomach has been hurting over past month.  Pt states pain is direct in mid stomach all the time but worse after he eats. Then after eating he feels like acid travels up his esophagus up to throat. Will have symptoms even if eats toast. One month ago ED gave him sucraflate which pt thinks did not help.  Pt has appointment with GI on December 02, 2018.   When pt stopped protonix 40 mg twice daily when he started carafate.  ED note reviewed today. Pt notes GI cocktail in ED did give him some relief. His cardiac work up was negative in ED. Troponin was negative.   Some mid swelling of feet. No popliteal pain. cxr last week negative for chf on review.  Observations/Objective:  General- no acute distress. Lungs- not labored.  Neuro- gross motor function intake. Abdomen-epigastric area pain. When he palpates only mild pain. Lower ext- mid pedal edema of feet. Symmetric.   Assessment and Plan: You do appear to have gerd type symptoms. By severe description I do think you have likely some esophagitis. Get back on your protonix and continue carafate. If symptoms persist despite protonix then can add famotidine.  I do think your cough is related to gerd.  If you have any new symptoms that seem to be in chest/cardiac then  return to ED.  Your pedal edema is likely dependent edema since your cxr was clear. Continue hctz and elevated legs. If swelling worsens.  Follow up in 2 weeks virtual visit or as needed   25 minutes spent with pt. 50% of time spent counseling on plan going forward and answering pt questions. Follow Up Instructions:    I discussed the assessment and treatment plan with the patient. The patient was provided an opportunity to ask questions and all were answered. The patient agreed with the plan and demonstrated an understanding of the instructions.   The patient was advised to call back or seek an in-person evaluation if the symptoms worsen or if the condition fails to improve as anticipated.    Mackie Pai, PA-C    Review of Systems  Constitutional: Negative for chills, fatigue and fever.  HENT: Negative for congestion, drooling, ear discharge and ear pain.   Respiratory: Positive for cough. Negative for chest tightness, shortness of breath and wheezing.   Cardiovascular: Negative for chest pain and palpitations.  Gastrointestinal: Positive for abdominal pain. Negative for abdominal distention, blood in stool, constipation, diarrhea, nausea and vomiting.  Musculoskeletal: Negative for back pain, myalgias and neck pain.  Skin: Negative for rash.  Neurological: Negative for dizziness, seizures, syncope, weakness and headaches.  Hematological: Negative for adenopathy. Does not bruise/bleed easily.  Psychiatric/Behavioral: Negative for behavioral problems, confusion, dysphoric mood, sleep disturbance and suicidal ideas. The patient is not nervous/anxious.  Past Medical History:  Diagnosis Date  . Atypical chest pain     Negative cardiac catheterization 2014  . Barrett's esophagus   . 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 Blind right eye   . 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 Chicken pox   . Chronic back pain   . Dislocation, jaw 1960s  . Edema     Left Leg  . Essential hypertension, benign   . GERD (gastroesophageal reflux disease)   . [place] measles   . Heart attack ([place])   . Hyperlipidemia   . Measles   . Mumps   . Osteopenia 11.12.10   bone density test  . Personal history of other diseases of digestive system 08.10.12   Gastric ulcer     Social History   Socioeconomic History  . Marital status: Single    Spouse name: Not on file  . Number of children: 0  . Years of education: Not on file  . Highest education level: Not on file  Occupational History  . Not on file  Social Needs  . Financial resource strain: Not on file  . Food insecurity:    Worry: Not on file    Inability: Not on file  . Transportation needs:    Medical: Not on file    Non-medical: Not on file  Tobacco Use  . Smoking status: Former Smoker    Packs/day: 0.25    Years: 2.00    Pack years: 0.50    Types: Cigarettes    Last attempt to quit: 06/02/1966    Years since quitting: 52.3  . Smokeless tobacco: Never Used  Substance and Sexual Activity  . Alcohol use: No    Alcohol/week: 0.0 standard drinks  . Drug use: No  . Sexual activity: Never  Lifestyle  . Physical activity:    Days per week: Not on file    Minutes per session: Not on file  . Stress: Not on file  Relationships  . Social connections:    Talks on phone: Not on file    Gets together: Not on file    Attends religious service: Not on file    Active member of club or organization: Not on file    Attends meetings of clubs or organizations: Not on file    Relationship status: Not on file  . Intimate partner violence:    Fear of current or ex partner: Not on file    Emotionally abused: Not on file    Physically abused: Not on file    Forced sexual activity: Not on file  Other Topics Concern  . Not on file  Social History Narrative  . Not on file    Past Surgical History:  Procedure Laterality Date  . ABDOMINAL HERNIA REPAIR  1972  . APPENDECTOMY  1973  . BACK SURGERY   10/28/2017  . CATARACT EXTRACTION, BILATERAL  2005  . CHOLECYSTECTOMY  08/2016  . COLONOSCOPY WITH ESOPHAGOGASTRODUODENOSCOPY (EGD)     about 2016. [facility]  . ESOPHAGOGASTRODUODENOSCOPY  06.2011  . EYE SURGERY  1972   Bilateral Lens Replacement  . FACIAL RECONSTRUCTION SURGERY  2008  . LEFT HEART CATH  02.17.2014  . [place], 2008   Dislocated Jaw  . PARS PLANA VITRECTOMY W/ REPAIR OF MACULAR HOLE  2005   macular hole repair, right eye  . PARS PLANA VITRECTOMY W/ REPAIR OF MACULAR HOLE  2010   mechanical, left eye  . PROSTATE BIOPSY  [place]  2017   w/ Dr. [place] Crane  . WISDOM TOOTH EXTRACTION      Family History  Problem Relation Age of Onset  . Alzheimer's disease Mother        Deceased  . Diabetes Maternal Grandmother   . Heart attack Maternal Grandmother   . Prostate cancer Maternal Uncle   . Diabetes Maternal Uncle   . Healthy Son   . Cataracts Maternal Aunt   . Colon cancer Neg Hx   . Esophageal cancer Neg Hx     Allergies  Allergen Reactions  . Gabapentin Other (See Comments)    Feel "weird"; Nausea; Weakness; Syncope    Current Outpatient Medications on File Prior to Visit  Medication Sig Dispense Refill  . aspirin (ASPIR-81) 81 MG EC tablet Take 1 tablet by mouth daily.    Marland Kitchen b complex vitamins tablet Take 1 tablet by mouth daily.    . benzonatate (TESSALON) 100 MG capsule Take 1 capsule (100 mg total) by mouth 3 (three) times daily as needed. 30 capsule 0  . Calcium Carbonate-Vitamin D (CALCIUM-VITAMIN D3 PO) Take by mouth daily.    . fluticasone (FLONASE) 50 MCG/ACT nasal spray Place 2 sprays into both nostrils daily. 16 g 1  . hydrochlorothiazide (MICROZIDE) 12.5 MG capsule Take 1 capsule (12.5 mg total) by mouth daily. 90 capsule 3  . linaclotide (LINZESS) 145 MCG CAPS capsule Take 1 capsule (145 mcg total) by mouth daily before breakfast. 30 capsule 0  . pantoprazole (PROTONIX) 40 MG tablet Take 1 tablet (40 mg total) by mouth 2 (two)  times daily. 180 tablet 3  . simvastatin (ZOCOR) 40 MG tablet Take 1 tablet (40 mg total) by mouth every evening. 90 tablet 3  . sucralfate (CARAFATE) 1 g tablet Take 1 tablet (1 g total) by mouth 4 (four) times daily -  with meals and at bedtime. 90 tablet 0  . tamsulosin (FLOMAX) 0.4 MG CAPS capsule Take 2 capsules (0.8 mg total) by mouth daily. 90 capsule 1  . timolol (TIMOPTIC) 0.5 % ophthalmic solution Place 1 drop into the left eye daily. 10 mL 12  . traMADol (ULTRAM) 50 MG tablet 1 tab po q 8 hours prn pain 15 tablet 0   No current facility-administered medications on file prior to visit.     Wt 195 lb (88.5 kg)   BMI 33.47 kg/m       Objective:   Physical Exam        Assessment & Plan:

## 2018-10-22 ENCOUNTER — Ambulatory Visit: Payer: MEDICARE | Admitting: Medical

## 2018-12-02 ENCOUNTER — Other Ambulatory Visit: Payer: Self-pay

## 2018-12-06 ENCOUNTER — Ambulatory Visit: Payer: MEDICARE | Admitting: Medical

## 2018-12-07 ENCOUNTER — Other Ambulatory Visit: Payer: Self-pay | Admitting: Medical

## 2018-12-08 ENCOUNTER — Other Ambulatory Visit: Payer: Self-pay

## 2018-12-08 ENCOUNTER — Ambulatory Visit (HOSPITAL_BASED_OUTPATIENT_CLINIC_OR_DEPARTMENT_OTHER)
Admission: RE | Admit: 2018-12-08 | Discharge: 2018-12-08 | Disposition: A | Discharge status: Home / Self Care | Payer: MEDICARE | Source: Ambulatory Visit | Attending: Medical | Admitting: Medical

## 2018-12-08 ENCOUNTER — Ambulatory Visit (INDEPENDENT_AMBULATORY_CARE_PROVIDER_SITE_OTHER): Payer: MEDICARE | Admitting: Medical

## 2018-12-08 ENCOUNTER — Encounter: Payer: Self-pay | Admitting: Medical

## 2018-12-08 VITALS — BP 137/77 | HR 60 | Temp 97.8°F | Resp 16 | Ht 64.0 in | Wt 197.2 lb

## 2018-12-08 DIAGNOSIS — I10 Essential (primary) hypertension: Secondary | ICD-10-CM | POA: Diagnosis not present

## 2018-12-08 DIAGNOSIS — R600 Localized edema: Secondary | ICD-10-CM

## 2018-12-08 DIAGNOSIS — M542 Cervicalgia: Secondary | ICD-10-CM

## 2018-12-08 DIAGNOSIS — R0600 Dyspnea, unspecified: Secondary | ICD-10-CM | POA: Diagnosis not present

## 2018-12-08 DIAGNOSIS — I2510 Atherosclerotic heart disease of native coronary artery without angina pectoris: Secondary | ICD-10-CM | POA: Diagnosis not present

## 2018-12-08 DIAGNOSIS — R110 Nausea: Secondary | ICD-10-CM

## 2018-12-08 LAB — COMPREHENSIVE METABOLIC PANEL
ALT: 17 U/L (ref 0–53)
AST: 17 U/L (ref 0–37)
Albumin: 4.2 g/dL (ref 3.5–5.2)
Alkaline Phosphatase: 65 U/L (ref 39–117)
BUN: 9 mg/dL (ref 6–23)
CO2: 29 mEq/L (ref 19–32)
Calcium: 9.4 mg/dL (ref 8.4–10.5)
Chloride: 103 mEq/L (ref 96–112)
Creatinine, Ser: 1.04 mg/dL (ref 0.40–1.50)
GFR: 84.24 mL/min (ref >60.00)
Glucose, Bld: 95 mg/dL (ref 70–99)
Potassium: 4.5 mEq/L (ref 3.5–5.1)
Sodium: 139 mEq/L (ref 135–145)
Total Bilirubin: 1.5 mg/dL — ABNORMAL HIGH (ref 0.2–1.2)
Total Protein: 6.8 g/dL (ref 6.0–8.3)

## 2018-12-08 LAB — BRAIN NATRIURETIC PEPTIDE: Pro B Natriuretic peptide (BNP): 17 pg/mL (ref 0.0–100.0)

## 2018-12-08 LAB — LIPASE: Lipase: 19 U/L (ref 11.0–59.0)

## 2018-12-08 MED ORDER — TRAMADOL HCL 50 MG PO TABS: ORAL_TABLET | ORAL | 0 refills | Status: DC

## 2018-12-08 MED ORDER — CYCLOBENZAPRINE HCL 10 MG PO TABS: 10.0000 mg | ORAL_TABLET | Freq: Every day | ORAL | 0 refills | Status: DC

## 2018-12-08 MED FILL — traMADol HCL 50 MG TABS: 50 | 5 days supply | Qty: 15 | Fill #0

## 2018-12-08 NOTE — Patient Instructions (Signed)
For your chronic lower back pain, I am glad to hear that you will have nerve stimulator trial starting this Friday to see if this can help with your pain.  Hopeful that that would work.  You do have some chronic neck pain as well as trapezius region pain bilaterally.  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  Pain think is combination of degenerative changes and trapezius muscle pain.  I am going to send in prescription of tramadol and Flexeril to your pharmacy.  But advised not using these until you finish with a trial of the neurostimulator as these would interfere with your interpretation of whether or not the nerve stimulator is working.  Bilateral mild lower extremity pedal edema, I do think this is likely gravity/dependent edema.  But will get CMP, BNP and chest x-ray today.  If these are negative then would recommend that she get back on your compression hose stockings.  Follow-up date to be determined after review of labs and x-rays.

## 2018-12-08 NOTE — Progress Notes (Signed)
Subjective:    Patient ID: Brian [name], male    DOB: 1945/06/25, 73 y.o.   MRN: [id number]  HPI  Pt in with some back pain. This pain is chronic in lumbar area with rt side back pain. On Friday he is going to office to have trial treatment for nerve stimulator to see if pain will resolve. He states trial test for 7 days. Rt great toe intermittent numbness.  He has some bilateral feet swelling for about one month. At times hard to put shoes on. He states some orthopnea. When he walks sometimes has to catch breath going up steps. Pt weight fluctuates 196 lb to 200 lb. Now 197 lb.  Pt had pft before in past. He states inhaler did not help in past.  Pt has cpap and he states does not help.  Some neck pain at times also noted.  IMPRESSION: Multilevel degenerative changes without acute abnormality  Pt had esphagus dilation coming up soon and will get egd.    Review of Systems  Constitutional: Negative for chills, fatigue and fever.  Respiratory: Positive for shortness of breath. Negative for chest tightness and wheezing.        Not current but see hpi.  Cardiovascular: Negative for chest pain and palpitations.  Gastrointestinal: Negative for abdominal pain.  Genitourinary: Negative for dysuria.  Musculoskeletal: Positive for back pain and neck pain.  Skin: Negative for rash.  Neurological: Negative for dizziness, speech difficulty, weakness and headaches.  Hematological: Negative for adenopathy. Does not bruise/bleed easily.  Psychiatric/Behavioral: Negative for behavioral problems, dysphoric mood and suicidal ideas. The patient is not nervous/anxious.     Past Medical History:  Diagnosis Date  . Atypical chest pain     Negative cardiac catheterization 2014  . Barrett's esophagus   . 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 Blind right eye   . 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 Chicken pox   . Chronic back pain   . Dislocation, jaw 1960s  . Edema    Left Leg  . Essential hypertension, benign   . GERD (gastroesophageal reflux disease)   . [place] measles   . Heart attack ([place])   . Hyperlipidemia   . Measles   . Mumps   . Osteopenia 11.12.10   bone density test  . Personal history of other diseases of digestive system 08.10.12   Gastric ulcer     Social History   Socioeconomic History  . Marital status: Single    Spouse name: Not on file  . Number of children: 0  . Years of education: Not on file  . Highest education level: Not on file  Occupational History  . Not on file  Social Needs  . Financial resource strain: Not on file  . Food insecurity    Worry: Not on file    Inability: Not on file  . Transportation needs    Medical: Not on file    Non-medical: Not on file  Tobacco Use  . Smoking status: Former Smoker    Packs/day: 0.25    Years: 2.00    Pack years: 0.50    Types: Cigarettes    Quit date: 06/02/1966    Years since quitting: 52.5  . Smokeless tobacco: Never Used  Substance and Sexual Activity  . Alcohol use: No    Alcohol/week: 0.0 standard drinks  . Drug use: No  . Sexual activity: Never  Lifestyle  . Physical activity    Days per week: Not  on file    Minutes per session: Not on file  . Stress: Not on file  Relationships  . Social Herbalist on phone: Not on file    Gets together: Not on file    Attends religious service: Not on file    Active member of club or organization: Not on file    Attends meetings of clubs or organizations: Not on file    Relationship status: Not on file  . Intimate partner violence    Fear of current or ex partner: Not on file    Emotionally abused: Not on file    Physically abused: Not on file    Forced sexual activity: Not on file  Other Topics Concern  . Not on file  Social History Narrative  . Not on file    Past Surgical History:  Procedure Laterality Date  . ABDOMINAL HERNIA REPAIR  1972  . APPENDECTOMY  1973  . BACK SURGERY  10/28/2017  .  CATARACT EXTRACTION, BILATERAL  2005  . CHOLECYSTECTOMY  08/2016  . COLONOSCOPY WITH ESOPHAGOGASTRODUODENOSCOPY (EGD)     about 2016. [facility]  . ESOPHAGOGASTRODUODENOSCOPY  06.2011  . EYE SURGERY  1972   Bilateral Lens Replacement  . FACIAL RECONSTRUCTION SURGERY  2008  . LEFT HEART CATH  02.17.2014  . [place], 2008   Dislocated Jaw  . PARS PLANA VITRECTOMY W/ REPAIR OF MACULAR HOLE  2005   macular hole repair, right eye  . PARS PLANA VITRECTOMY W/ REPAIR OF MACULAR HOLE  2010   mechanical, left eye  . PROSTATE BIOPSY  [place] 2017   w/ Dr. Nevada Crane  . WISDOM TOOTH EXTRACTION      Family History  Problem Relation Age of Onset  . Alzheimer's disease Mother        Deceased  . Diabetes Maternal Grandmother   . Heart attack Maternal Grandmother   . Prostate cancer Maternal Uncle   . Diabetes Maternal Uncle   . Healthy Son   . Cataracts Maternal Aunt   . Colon cancer Neg Hx   . Esophageal cancer Neg Hx     Allergies  Allergen Reactions  . Gabapentin Other (See Comments)    Feel "weird"; Nausea; Weakness; Syncope    Current Outpatient Medications on File Prior to Visit  Medication Sig Dispense Refill  . aspirin (ASPIR-81) 81 MG EC tablet Take 1 tablet by mouth daily.    Marland Kitchen b complex vitamins tablet Take 1 tablet by mouth daily.    . benzonatate (TESSALON) 100 MG capsule Take 1 capsule (100 mg total) by mouth 3 (three) times daily as needed. 30 capsule 0  . Calcium Carbonate-Vitamin D (CALCIUM-VITAMIN D3 PO) Take by mouth daily.    . fluticasone (FLONASE) 50 MCG/ACT nasal spray Place 2 sprays into both nostrils daily. 16 g 1  . hydrochlorothiazide (MICROZIDE) 12.5 MG capsule Take 1 capsule (12.5 mg total) by mouth daily. 90 capsule 3  . linaclotide (LINZESS) 145 MCG CAPS capsule Take 1 capsule (145 mcg total) by mouth daily before breakfast. 30 capsule 0  . pantoprazole (PROTONIX) 40 MG tablet Take 1 tablet (40 mg total) by mouth 2 (two) times daily. 180  tablet 3  . simvastatin (ZOCOR) 40 MG tablet Take 1 tablet (40 mg total) by mouth every evening. 90 tablet 3  . sucralfate (CARAFATE) 1 g tablet Take 1 tablet (1 g total) by mouth 4 (four) times daily -  with meals and at  bedtime. 90 tablet 0  . tamsulosin (FLOMAX) 0.4 MG CAPS capsule Take 2 capsules (0.8 mg total) by mouth daily. 90 capsule 1  . timolol (TIMOPTIC) 0.5 % ophthalmic solution Place 1 drop into the left eye daily. 10 mL 12  . traMADol (ULTRAM) 50 MG tablet 1 tab po q 8 hours prn pain 15 tablet 0   No current facility-administered medications on file prior to visit.     BP 137/77   Pulse 60   Temp 97.8 F (36.6 C) (Oral)   Resp 16   Ht 5\' 4"  (1.626 m)   Wt 197 lb 3.2 oz (89.4 kg)   SpO2 99%   BMI 33.85 kg/m       Objective:   Physical Exam  General Appearance- Not in acute distress.    Chest and Lung Exam Auscultation: Breath sounds:-Normal. Clear even and unlabored. Adventitious sounds:- No Adventitious sounds.  Neck- pain on palpation of trapezius mucles. And some mid spine.  Cardiovascular Auscultation:Rythm - Regular, rate and rythm. Heart Sounds -Normal heart sounds.  Abdomen Inspection:-Inspection Normal.  Palpation/Perucssion: Palpation and Percussion of the abdomen reveal- Non Tender, No Rebound tenderness, No rigidity(Guarding) and No Palpable abdominal masses.  Liver:-Normal.  Spleen:- Normal.   Back Mid lumbar spine tenderness to palpation. Pain on straight leg lift. Pain on lateral movements and flexion/extension of the spine.  Lower ext neurologic  L5-S1 sensation intact bilaterally. Normal patellar reflexes bilaterally. No foot drop bilaterally.  Lower ext- mild 1+ pedal edema bilaterally at best(symmetric). Negative homans signs.       Assessment & Plan:  For your chronic lower back pain, I am glad to hear that you will have nerve stimulator trial starting this Friday to see if this can help with your pain.  Hopeful that that  would work.  You do have some chronic neck pain as well as trapezius region pain bilaterally.  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  Pain think is combination of degenerative changes and trapezius muscle pain.  I am going to send in prescription of tramadol and Flexeril to your pharmacy.  But advised not using these until you finish with a trial of the neurostimulator as these would interfere with your interpretation of whether or not the nerve stimulator is working.  Bilateral mild lower extremity pedal edema, I do think this is likely gravity/dependent edema.  But will get CMP, BNP and chest x-ray today.  If these are negative then would recommend that she get back on your compression hose stockings.  Follow-up date to be determined after review of labs and x-rays.  25+ minutes spent with patient today.  50% of time spent counseling patient on plan going forward for each condition.

## 2018-12-14 ENCOUNTER — Other Ambulatory Visit: Payer: Self-pay

## 2018-12-14 ENCOUNTER — Telehealth: Payer: Self-pay | Admitting: Medical

## 2018-12-14 ENCOUNTER — Ambulatory Visit (INDEPENDENT_AMBULATORY_CARE_PROVIDER_SITE_OTHER): Payer: MEDICARE | Admitting: Medical

## 2018-12-14 VITALS — BP 136/74 | HR 63 | Temp 98.0°F | Resp 16 | Ht 64.0 in | Wt 196.2 lb

## 2018-12-14 DIAGNOSIS — R413 Other amnesia: Secondary | ICD-10-CM | POA: Diagnosis not present

## 2018-12-14 DIAGNOSIS — I2510 Atherosclerotic heart disease of native coronary artery without angina pectoris: Secondary | ICD-10-CM | POA: Diagnosis not present

## 2018-12-14 DIAGNOSIS — Z818 Family history of other mental and behavioral disorders: Secondary | ICD-10-CM | POA: Diagnosis not present

## 2018-12-14 DIAGNOSIS — E785 Hyperlipidemia, unspecified: Secondary | ICD-10-CM

## 2018-12-14 LAB — LIPID PANEL
Cholesterol: 228 mg/dL — ABNORMAL HIGH (ref 0–200)
HDL: 49.6 mg/dL (ref >39.00)
LDL Cholesterol: 152 mg/dL — ABNORMAL HIGH (ref 0–99)
NonHDL: 178.32
Total CHOL/HDL Ratio: 5
Triglycerides: 130 mg/dL (ref 0.0–149.0)
VLDL: 26 mg/dL (ref 0.0–40.0)

## 2018-12-14 MED ORDER — ROSUVASTATIN CALCIUM 20 MG PO TABS: 20.0000 mg | ORAL_TABLET | Freq: Every day | ORAL | 3 refills | Status: DC

## 2018-12-14 NOTE — Telephone Encounter (Signed)
Rx crestor.

## 2018-12-14 NOTE — Patient Instructions (Signed)
You have strong family history of dementia and functionally report some issues from day to day. However good mine-mental status score of 30/30. After discussion about your concerns will refer you to neurologist. Date may be delay based if they prioritize according to complaints in light of your good score on mini-mental status. During interim, recommend healthy diet, exercise and do brain game exercise on line.   For high cholesterol, will repeat lipid panel today.  Glad to hear your back pain is improved with trial stimulator.  Follow up early September or as needed

## 2018-12-14 NOTE — Telephone Encounter (Signed)
Rx crestor sent to pt pharmacy. If covered well then will stop simvastatin.

## 2018-12-14 NOTE — Progress Notes (Signed)
Subjective:    Patient ID: Brian [name], male    DOB: January 06, 1946, 73 y.o.   MRN: [id number]  HPI  Pt in for follow up.   He has belt on connect to electrodes in left si area on inspection. Specialist gave gave belt for one week trial and controls pain. In one month will go through actual procedure since controls pain.  Pt states he has concern about memory loss. He is losing things easily. Forgets things in mid conversation. Sometimes can't recognize persons faces. Pt never gets lost. Pt states mom, aunts and uncles have had dementia.   Pt mom dx with dementia in 39's.  Pt does do jig saw puzzles. Not much social interaction other than direct contact with family.  Sometimes relatives will ask him to do things and he completely forgets that he was asked.  Pt does have high cholesterol in the past.   Ct scan in 2018 IMPRESSION: Age related volume loss. No intracranial mass, hemorrhage, or extra-axial fluid collection. Gray-white compartments are normal.  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 Mild ethmoid air cell mucosal thickening bilaterally.    Review of Systems  Constitutional: Negative for chills, fatigue and fever.  Respiratory: Negative for cough, chest tightness, shortness of breath and wheezing.   Cardiovascular: Negative for chest pain and palpitations.  Gastrointestinal: Negative for abdominal pain.  Musculoskeletal: Negative for back pain and neck pain.  Skin: Negative for rash.  Neurological: Negative for syncope, facial asymmetry, weakness and numbness.  Hematological: Negative for adenopathy. Does not bruise/bleed easily.  Psychiatric/Behavioral: Negative for behavioral problems, confusion and dysphoric mood. The patient is not nervous/anxious.        Memory concerns.     Past Medical History:  Diagnosis Date  . Atypical chest pain     Negative cardiac catheterization 2014  . Barrett's esophagus   . 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 Blind right eye   .  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 Chicken pox   . Chronic back pain   . Dislocation, jaw 1960s  . Edema    Left Leg  . Essential hypertension, benign   . GERD (gastroesophageal reflux disease)   . [place] measles   . Heart attack ([place])   . Hyperlipidemia   . Measles   . Mumps   . Osteopenia 11.12.10   bone density test  . Personal history of other diseases of digestive system 08.10.12   Gastric ulcer     Social History   Socioeconomic History  . Marital status: Single    Spouse name: Not on file  . Number of children: 0  . Years of education: Not on file  . Highest education level: Not on file  Occupational History  . Not on file  Social Needs  . Financial resource strain: Not on file  . Food insecurity    Worry: Not on file    Inability: Not on file  . Transportation needs    Medical: Not on file    Non-medical: Not on file  Tobacco Use  . Smoking status: Former Smoker    Packs/day: 0.25    Years: 2.00    Pack years: 0.50    Types: Cigarettes    Quit date: 06/02/1966    Years since quitting: 52.5  . Smokeless tobacco: Never Used  Substance and Sexual Activity  . Alcohol use: No    Alcohol/week: 0.0 standard drinks  . Drug use: No  . Sexual activity: Never  Lifestyle  . Physical activity    Days per week: Not on file    Minutes per session: Not on file  . Stress: Not on file  Relationships  . Social Herbalist on phone: Not on file    Gets together: Not on file    Attends religious service: Not on file    Active member of club or organization: Not on file    Attends meetings of clubs or organizations: Not on file    Relationship status: Not on file  . Intimate partner violence    Fear of current or ex partner: Not on file    Emotionally abused: Not on file    Physically abused: Not on file    Forced sexual activity: Not on file  Other Topics Concern  . Not on file  Social History Narrative  . Not on file     Past Surgical History:  Procedure Laterality Date  . ABDOMINAL HERNIA REPAIR  1972  . APPENDECTOMY  1973  . BACK SURGERY  10/28/2017  . CATARACT EXTRACTION, BILATERAL  2005  . CHOLECYSTECTOMY  08/2016  . COLONOSCOPY WITH ESOPHAGOGASTRODUODENOSCOPY (EGD)     about 2016. [facility]  . ESOPHAGOGASTRODUODENOSCOPY  06.2011  . EYE SURGERY  1972   Bilateral Lens Replacement  . FACIAL RECONSTRUCTION SURGERY  2008  . LEFT HEART CATH  02.17.2014  . [place], 2008   Dislocated Jaw  . PARS PLANA VITRECTOMY W/ REPAIR OF MACULAR HOLE  2005   macular hole repair, right eye  . PARS PLANA VITRECTOMY W/ REPAIR OF MACULAR HOLE  2010   mechanical, left eye  . PROSTATE BIOPSY  [place] 2017   w/ Dr. [place] Crane  . WISDOM TOOTH EXTRACTION      Family History  Problem Relation Age of Onset  . Alzheimer's disease Mother        Deceased  . Diabetes Maternal Grandmother   . Heart attack Maternal Grandmother   . Prostate cancer Maternal Uncle   . Diabetes Maternal Uncle   . Healthy Son   . Cataracts Maternal Aunt   . Colon cancer Neg Hx   . Esophageal cancer Neg Hx     Allergies  Allergen Reactions  . Gabapentin Other (See Comments)    Feel "weird"; Nausea; Weakness; Syncope    Current Outpatient Medications on File Prior to Visit  Medication Sig Dispense Refill  . aspirin (ASPIR-81) 81 MG EC tablet Take 1 tablet by mouth daily.    Marland Kitchen b complex vitamins tablet Take 1 tablet by mouth daily.    . benzonatate (TESSALON) 100 MG capsule Take 1 capsule (100 mg total) by mouth 3 (three) times daily as needed. 30 capsule 0  . Calcium Carbonate-Vitamin D (CALCIUM-VITAMIN D3 PO) Take by mouth daily.    . cyclobenzaprine (FLEXERIL) 10 MG tablet Take 1 tablet (10 mg total) by mouth at bedtime. 30 tablet 0  . fluticasone (FLONASE) 50 MCG/ACT nasal spray USE 2 SPRAYS IN EACH NOSTRIL ONE TIME DAILY 32 g 0  . hydrochlorothiazide (MICROZIDE) 12.5 MG capsule Take 1 capsule (12.5 mg total) by mouth  daily. 90 capsule 3  . linaclotide (LINZESS) 145 MCG CAPS capsule Take 1 capsule (145 mcg total) by mouth daily before breakfast. 30 capsule 0  . pantoprazole (PROTONIX) 40 MG tablet Take 1 tablet (40 mg total) by mouth 2 (two) times daily. 180 tablet 3  . simvastatin (ZOCOR) 40 MG tablet Take 1 tablet (40  mg total) by mouth every evening. 90 tablet 3  . sucralfate (CARAFATE) 1 g tablet Take 1 tablet (1 g total) by mouth 4 (four) times daily -  with meals and at bedtime. 90 tablet 0  . tamsulosin (FLOMAX) 0.4 MG CAPS capsule Take 2 capsules (0.8 mg total) by mouth daily. 90 capsule 1  . timolol (TIMOPTIC) 0.5 % ophthalmic solution Place 1 drop into the left eye daily. 10 mL 12  . traMADol (ULTRAM) 50 MG tablet 1 tab po q 8 hours prn pain 15 tablet 0   No current facility-administered medications on file prior to visit.     BP 136/74   Pulse 63   Temp 98 F (36.7 C) (Oral)   Resp 16   Ht 5\' 4"  (1.626 m)   Wt 196 lb 3.2 oz (89 kg)   SpO2 98%   BMI 33.68 kg/m       Objective:   Physical Exam  General Mental Status- Alert. General Appearance- Not in acute distress.   Skin General: Color- Normal Color. Moisture- Normal Moisture.  Neck Carotid Arteries- Normal color. Moisture- Normal Moisture. No carotid bruits. No JVD.  Chest and Lung Exam Auscultation: Breath Sounds:-Normal.  Cardiovascular Auscultation:Rythm- Regular. Murmurs & Other Heart Sounds:Auscultation of the heart reveals- No Murmurs.  Abdomen Inspection:-Inspeection Normal. Palpation/Percussion:Note:No mass. Palpation and Percussion of the abdomen reveal- Non Tender, Non Distended + BS, no rebound or guarding.   Neurologic Cranial Nerve exam:- CN III-XII intact(No nystagmus), symmetric smile. Strength:- 5/5 equal and symmetric strength both upper and lower extremities.      Assessment & Plan:  You have strong family history of dementia and functionally report some issues from day to day. However good  mine-mental status score of 30/30. After discussion about your concerns will refer you to neurologist. Date may be delay based if they prioritize according to complaints in light of your good score on mini-mental status. During interim, recommend healthy diet, exercise and do brain game exercise on line.   For high cholesterol, will repeat lipid panel today.  Glad to hear your back pain is improved with trial stimulator.  Follow up early September or as needed  30 minutes spent with pt. 50% of time spent on counseling on his dementia concerns and plan going forward.  Mackie Pai, PA-C

## 2018-12-21 ENCOUNTER — Encounter: Payer: Self-pay | Admitting: Neurology

## 2018-12-21 ENCOUNTER — Telehealth (INDEPENDENT_AMBULATORY_CARE_PROVIDER_SITE_OTHER): Payer: MEDICARE | Admitting: Neurology

## 2018-12-21 ENCOUNTER — Other Ambulatory Visit: Payer: Self-pay

## 2018-12-21 ENCOUNTER — Telehealth: Payer: Self-pay | Admitting: Neurology

## 2018-12-21 VITALS — Ht 64.0 in | Wt 198.0 lb

## 2018-12-21 DIAGNOSIS — G3184 Mild cognitive impairment, so stated: Secondary | ICD-10-CM

## 2018-12-21 DIAGNOSIS — M5441 Lumbago with sciatica, right side: Secondary | ICD-10-CM

## 2018-12-21 DIAGNOSIS — M5416 Radiculopathy, lumbar region: Secondary | ICD-10-CM

## 2018-12-21 DIAGNOSIS — G609 Hereditary and idiopathic neuropathy, unspecified: Secondary | ICD-10-CM | POA: Diagnosis not present

## 2018-12-21 NOTE — Progress Notes (Signed)
Virtual Visit via Video Note Brian purpose of this virtual visit is to provide medical care while limiting exposure to Brian novel coronavirus.    Consent was obtained for video visit:  Yes.   Answered questions that [name] had about telehealth interaction:  Yes.   I discussed Brian limitations, risks, security and privacy concerns of performing an evaluation and management service by telemedicine. I also discussed with Brian [name] that there may be a [name] responsible charge related to this service. Brian [name] expressed understanding and agreed to proceed.  Pt location: Home Physician Location: office Name of referring provider:  Saguier, Percell Miller, PA-C I connected with Brian [name] at patients initiation/request on 12/21/2018 at 11:00 AM EDT by video enabled telemedicine application and verified that I am speaking with Brian correct person using two identifiers. Pt MRN:  [id number] Pt DOB:  12-17-1945 Video Participants:  Brian [name]   History of Present Illness:  This is a pleasant 73 year old right-handed man with a history of hypertension, hyperlipidemia, Barrett's esophagus, right eye prosthesis, chronic back pain, presenting for evaluation of worsening memory. He started noticing memory changes around 1-2 months ago when his 75 year old cousin asked him to fix something to eat, then came back later asking if he would fix it but he did not remember her asking. He has been told her repeats himself. He has been living with his cousin with 3 children for Brian past 6 years. He manages his own medications and admits to forgetting Brian mid-day dose around twice a week. His cousin manages bills. He stopped driving 8 years ago due to vision issues. He has word-finding difficulties, he would lose track of what he was saying. Sometimes he would be looking for something that is right in front of him. Mood is good, he denies increased irritability. No hallucinations. His mother, maternal aunt and uncle had  dementia. He had a concussion when he fell out of a tree at age 52. He does not drink alcohol.  He has a pressure headache every other day lasting 5 minutes with no associated nausea/vomiting. He does not take any medication for Brian headaches. 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 He has occasional swallowing difficulties due to Barrett's esophagus. He has chronic neck and back pain and recently had a spinal cord stimulator taken out. He has occasional numbness and tingling on his left arm. No bowel/bladder dysfunction, anosmia, or tremors. He has sleep apnea but states Brian CPAP does not help, he still wakes up several times at night and gets 4 hours of sleep. He states he stops breathing 20 times in an hour. Sleep study in 04/2018 showed severe OSA. He has not seen Dr. Halford Chessman recently. His MMSE at PCP office this month was 30/30.   PAST MEDICAL HISTORY: Past Medical History:  Diagnosis Date  . Atypical chest pain     Negative cardiac catheterization 2014  . Barrett's esophagus   . 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 Blind right eye   . 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 Chicken pox   . Chronic back pain   . Dislocation, jaw 1960s  . Edema    Left Leg  . Essential hypertension, benign   . GERD (gastroesophageal reflux disease)   . [place] measles   . Heart attack ([place])   . Hyperlipidemia   . Measles   . Mumps   . Osteopenia 11.12.10   bone  density test  . Personal history of other diseases of digestive system 08.10.12   Gastric ulcer    PAST SURGICAL HISTORY: Past Surgical History:  Procedure Laterality Date  . ABDOMINAL HERNIA REPAIR  1972  . APPENDECTOMY  1973  . BACK SURGERY  10/28/2017  . CATARACT EXTRACTION, BILATERAL  2005  . CHOLECYSTECTOMY  08/2016  . COLONOSCOPY WITH ESOPHAGOGASTRODUODENOSCOPY (EGD)     about 2016. [place] [facility]  . ESOPHAGOGASTRODUODENOSCOPY  06.2011  . EYE SURGERY  1972   Bilateral Lens  Replacement  . FACIAL RECONSTRUCTION SURGERY  2008  . LEFT HEART CATH  02.17.2014  . [place], 2008   Dislocated Jaw  . PARS PLANA VITRECTOMY W/ REPAIR OF MACULAR HOLE  2005   macular hole repair, right eye  . PARS PLANA VITRECTOMY W/ REPAIR OF MACULAR HOLE  2010   mechanical, left eye  . PROSTATE BIOPSY  [place] 2017   w/ Dr. [place] Crane  . WISDOM TOOTH EXTRACTION      MEDICATIONS: Current Outpatient Medications on File Prior to Visit  Medication Sig Dispense Refill  . aspirin (ASPIR-81) 81 MG EC tablet Take 1 tablet by mouth daily.    Marland Kitchen b complex vitamins tablet Take 1 tablet by mouth daily.    . Calcium Carbonate-Vitamin D (CALCIUM-VITAMIN D3 PO) Take by mouth daily.    . hydrochlorothiazide (MICROZIDE) 12.5 MG capsule Take 1 capsule (12.5 mg total) by mouth daily. 90 capsule 3  . pantoprazole (PROTONIX) 40 MG tablet Take 1 tablet (40 mg total) by mouth 2 (two) times daily. 180 tablet 3  . rosuvastatin (CRESTOR) 20 MG tablet Take 1 tablet (20 mg total) by mouth daily. 90 tablet 3  . tamsulosin (FLOMAX) 0.4 MG CAPS capsule Take 2 capsules (0.8 mg total) by mouth daily. 90 capsule 1  . timolol (TIMOPTIC) 0.5 % ophthalmic solution Place 1 drop into Brian left eye daily. 10 mL 12  . traMADol (ULTRAM) 50 MG tablet 1 tab po q 8 hours prn pain 15 tablet 0   No current facility-administered medications on file prior to visit.     ALLERGIES: Allergies  Allergen Reactions  . Gabapentin Other (See Comments)    Feel "weird"; Nausea; Weakness; Syncope    FAMILY HISTORY: Family History  Problem Relation Age of Onset  . Alzheimer's disease Mother        Deceased  . Diabetes Maternal Grandmother   . Heart attack Maternal Grandmother   . Prostate cancer Maternal Uncle   . Diabetes Maternal Uncle   . Healthy Son   . Cataracts Maternal Aunt   . Colon cancer Neg Hx   . Esophageal cancer Neg Hx     Observations/Objective:   Vitals:   12/21/18 1103  Weight: 198 lb (89.8 kg)   Height: 5\' 4"  (1.626 m)   GEN:  Brian [name] appears stated age and is in NAD. He has a prosthetic right eye.  Neurological examination: [name] is awake, alert, oriented x 3. No aphasia or dysarthria. Reduced fluency, intact comprehension. Remote and recent memory intact. Able to name and repeat. Cranial nerves: Extraocular movements on left eye intact with no nystagmus. No facial asymmetry. Motor: moves all extremities symmetrically, at least anti-gravity x 4. No incoordination on finger to nose testing. Gait: narrow-based and steady, able to tandem walk adequately. Negative Romberg test.  Montreal Cognitive Assessment  12/21/2018  Visuospatial/ Executive (0/5) 4  Naming (0/3) 3  Attention: Read list of digits (0/2) 2  Attention:  Read list of letters (0/1) 1  Attention: Serial 7 subtraction starting at 100 (0/3) 2  Language: Repeat phrase (0/2) 1  Language : Fluency (0/1) 0  Abstraction (0/2) 1  Delayed Recall (0/5) 3  Orientation (0/6) 6  Total 23    Assessment and Plan:   This is a pleasant 73 year old right-handed man with a history of hypertension, hyperlipidemia, Barrett's esophagus, right eye prosthesis, chronic back pain, presenting for evaluation of worsening memory. 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 We discussed different causes of memory loss. Check TSH and B12. MRI brain without contrast will be ordered to assess for underlying structural abnormality and assess vascular load. We discussed how poorly controlled sleep apnea can also cause cognitive issues, he was encouraged to speak to Dr. Halford Chessman regarding his CPAP concerns. We discussed Brian importance of control of vascular risk factors, physical exercise, and brain stimulation exercises for brain health. No clear indication to start cholinesterase inhibitors at this time, re-evaluate in 6 months, he knows to call for any changes.   Follow Up Instructions:   -I discussed Brian assessment and treatment  plan with Brian [name]. Brian [name] was provided an opportunity to ask questions and all were answered. Brian [name] agreed with Brian plan and demonstrated an understanding of Brian instructions.   Brian [name] was advised to call back or seek an in-person evaluation if Brian symptoms worsen or if Brian condition fails to improve as anticipated.    Cameron Sprang, MD

## 2018-12-21 NOTE — Telephone Encounter (Signed)
New Message  Anderson Malta from [facility] verbalized they do not accept outside orders from any other office due to the pandemic and their cap on their slots, they have only limited slots available.

## 2018-12-22 NOTE — Telephone Encounter (Signed)
Just making sure we are talking about the same place, was it Sales [facility] account executive Care at [facility] on [address]? I would think since we are also [place] they would let us do labs there?

## 2018-12-22 NOTE — Telephone Encounter (Signed)
Pt made aware of this. I called and spoke to the receptionist at [facility] and she states since our physician does not practice there that the pt cannot have orders faxed and him come to the facility to be drawn. I will notify Dr. Delice Lesch.

## 2018-12-23 ENCOUNTER — Telehealth: Payer: Self-pay | Admitting: Medical

## 2018-12-23 DIAGNOSIS — R413 Other amnesia: Secondary | ICD-10-CM

## 2018-12-23 NOTE — Telephone Encounter (Signed)
Yes, the blood work only.

## 2018-12-23 NOTE — Telephone Encounter (Signed)
We are talking about the TSH and B12, not the MRI brain, is that correct?

## 2018-12-23 NOTE — Telephone Encounter (Signed)
Dr. Lurlean Nanny,  I went ahead and put in order for both. Thank you for your help with Brian [name]. Will ask staff to contact him.  Percell Miller

## 2018-12-23 NOTE — Telephone Encounter (Signed)
Future labs placed for pt. The one neurologist ordered. Will you help get him scheduled.

## 2018-12-23 NOTE — Telephone Encounter (Signed)
Hi Edward, I wanted to do TSH and B12 as part of workup for memory loss, patient wants it done at your facility but lab would not let us order it since we are not providers there. Could you pls order, thanks  -KA

## 2018-12-23 NOTE — Telephone Encounter (Signed)
Yes. It is the same place. They told me since a provider at that facility did not order the tests that the pt could not come there. May be we could send a message to Dr. Harvie Heck to order the test?

## 2018-12-24 NOTE — Telephone Encounter (Signed)
Thanks

## 2018-12-27 ENCOUNTER — Other Ambulatory Visit: Payer: Self-pay

## 2018-12-27 ENCOUNTER — Telehealth: Payer: Self-pay | Admitting: Neurology

## 2018-12-27 NOTE — Telephone Encounter (Signed)
Patient is calling in about MRI and lab work questions. Please call him back at [phone]. Thanks!

## 2018-12-27 NOTE — Telephone Encounter (Signed)
Called patient back and he was aware of the MRI appt. [facility] had called him and he was able to verbalize the appt that was made for him there 8/22 at 0820. In regards to labs, per the chart they have been ordered and are to be drawn at [facility]. I called and spoke to [facility] at that office and they have the lab orders and the patient just needs to call them to set up time to go in for lab draw. Called patient and gave him the info to call his PCP [phone]) and schedule labs. No further questions.

## 2018-12-27 NOTE — Telephone Encounter (Signed)
scheduled

## 2018-12-29 ENCOUNTER — Other Ambulatory Visit: Payer: Self-pay

## 2018-12-29 ENCOUNTER — Other Ambulatory Visit (INDEPENDENT_AMBULATORY_CARE_PROVIDER_SITE_OTHER): Payer: MEDICARE

## 2018-12-29 DIAGNOSIS — R413 Other amnesia: Secondary | ICD-10-CM

## 2018-12-29 LAB — TSH: TSH: 2.56 u[IU]/mL (ref 0.35–4.50)

## 2018-12-29 LAB — VITAMIN B12: Vitamin B-12: 334 pg/mL (ref 211–911)

## 2019-01-02 IMAGING — DX DG THORACIC SPINE 2V
3 series · 3 of 3 positions shown · non-contrast
Comparison: Chest and rib series [DATE] 01/19/2017 .

CLINICAL DATA: Right arm pain.  No known injury.

EXAM:
THORACIC SPINE 2 VIEWS

[t-spine ap]
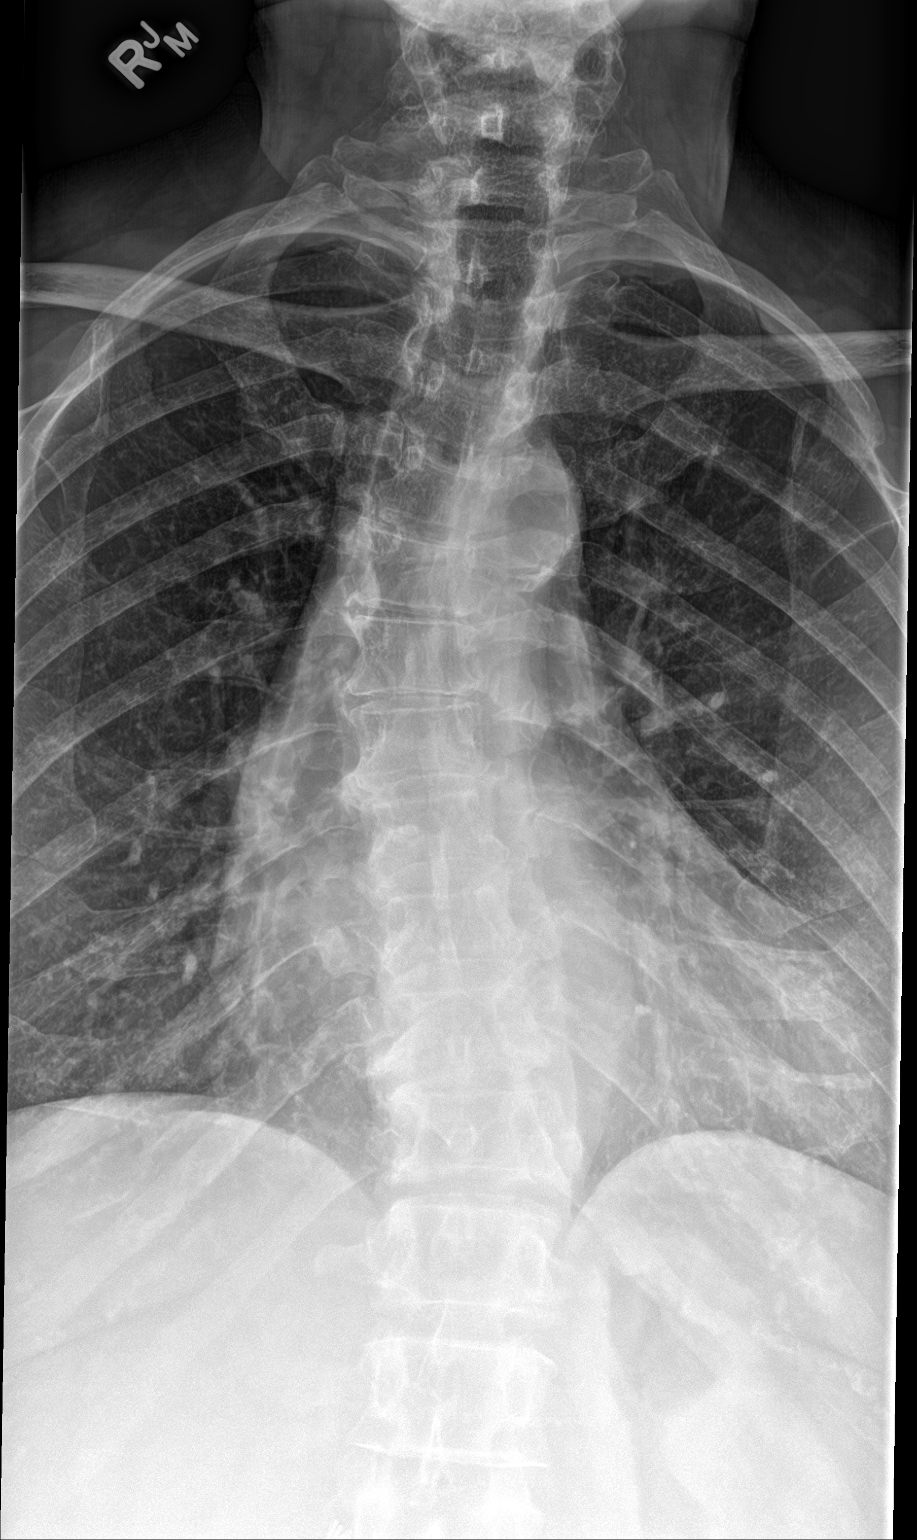

[t-spine lat]
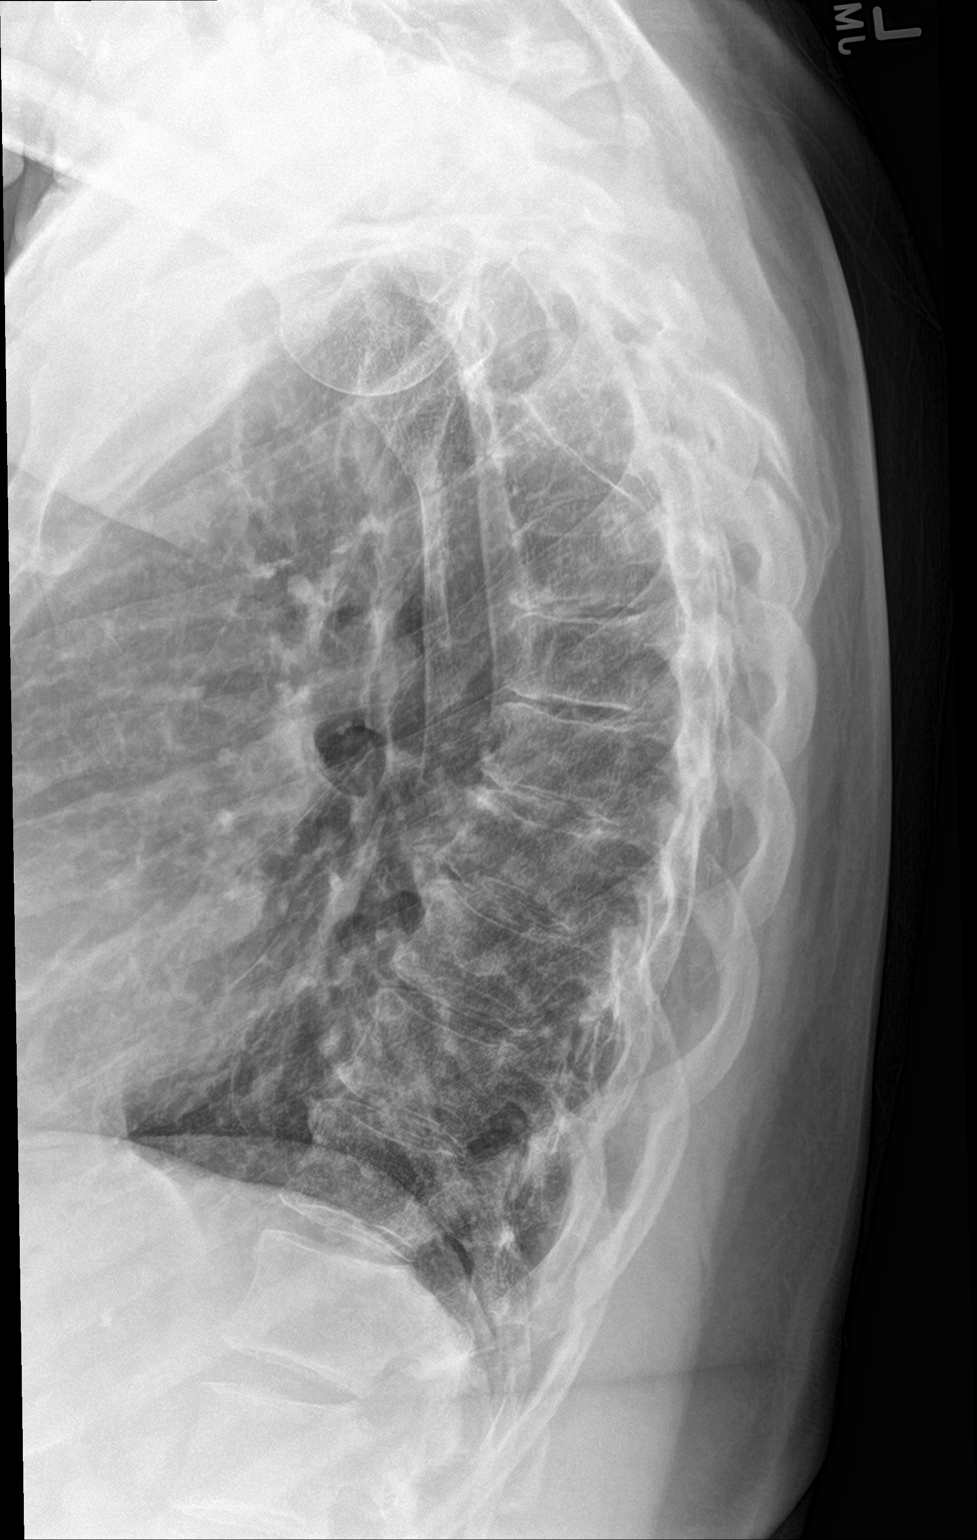

[t-spine swimmers]
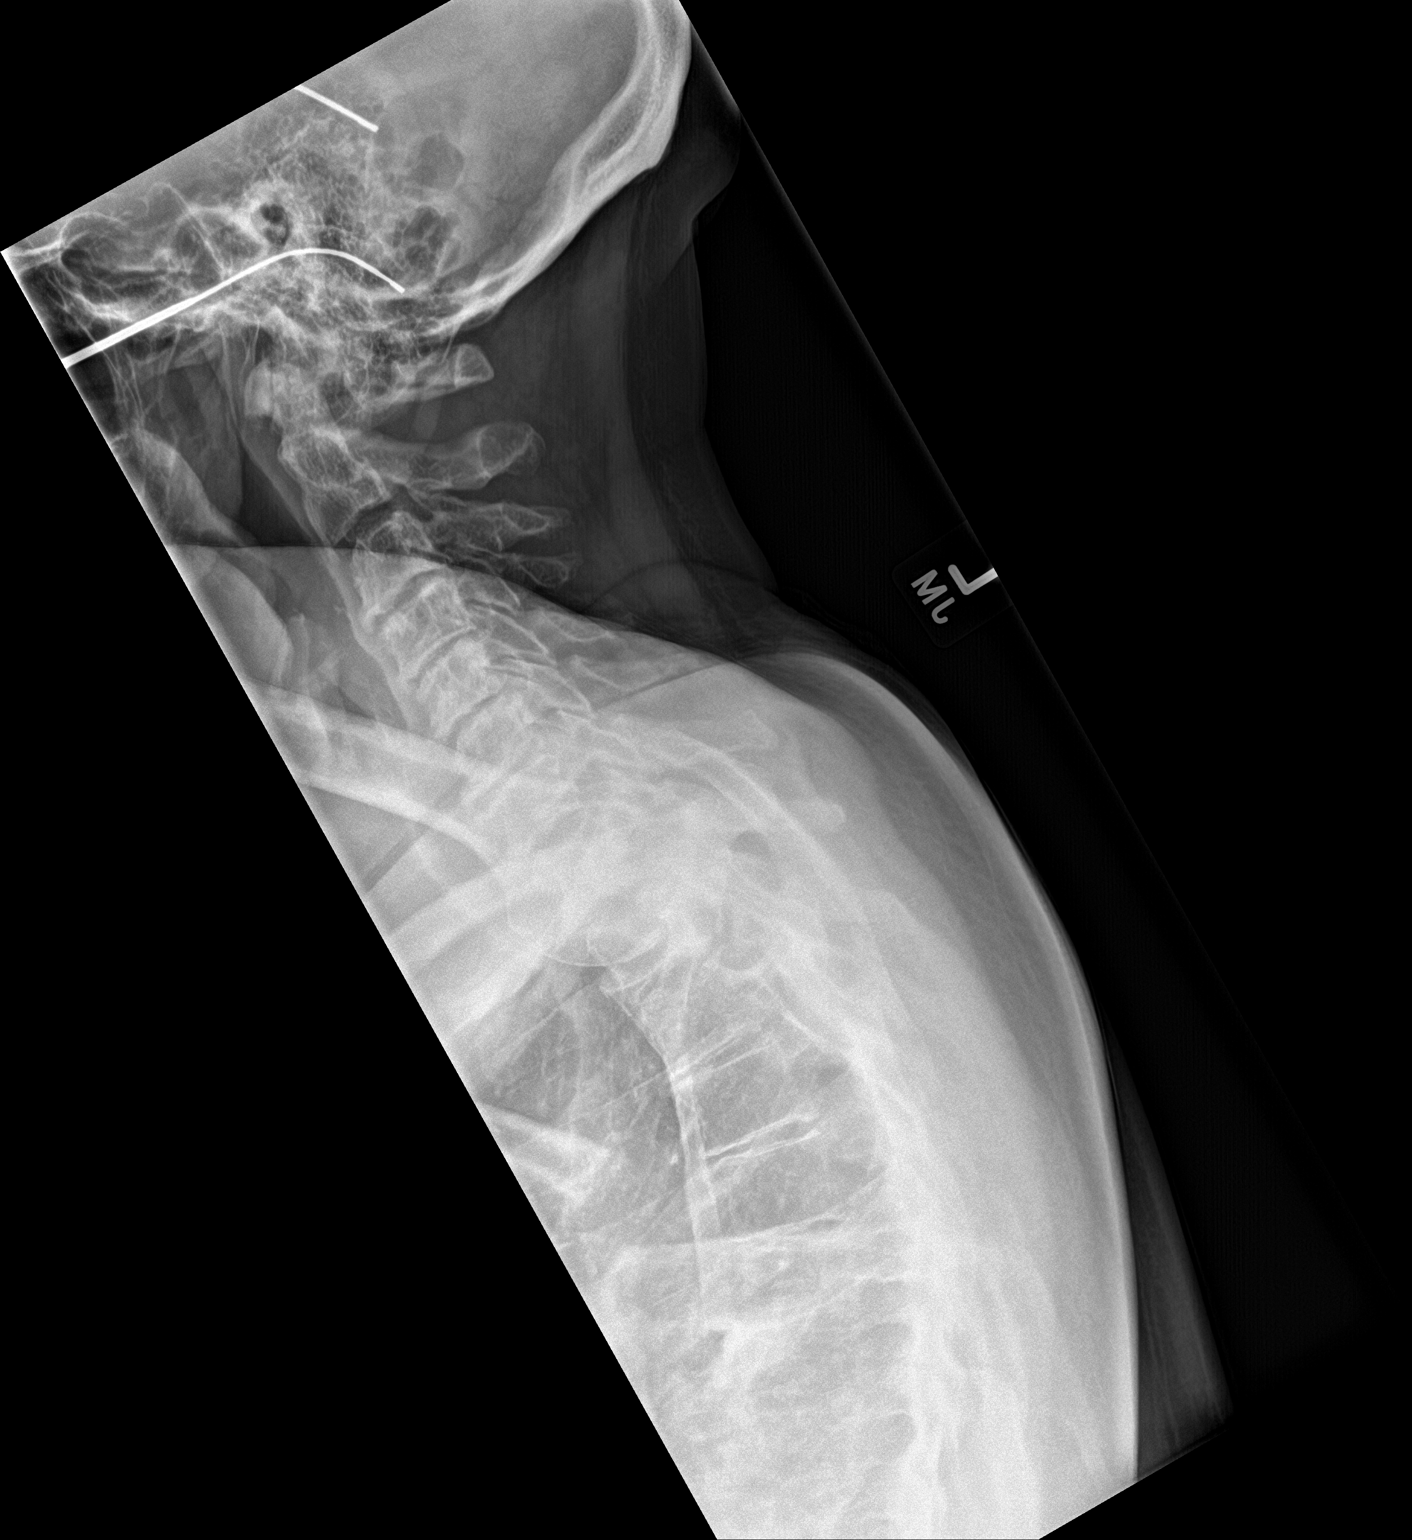

[3 of 3 positions shown; findings below may reference images not displayed]

FINDINGS: Thoracic spine S shaped scoliosis again noted. Diffuse degenerative
change. No acute bony abnormality.
IMPRESSION: Thoracic spine S shaped scoliosis.  No acute abnormality identified.

## 2019-01-02 IMAGING — DX DG SHOULDER 2+V*R*
3 series · 3 of 3 positions shown · non-contrast
Comparison: 05/27/2016 .

CLINICAL DATA: Pain.  No known injury .

EXAM:
RIGHT SHOULDER - 2+ VIEW

[shoulder grashey]
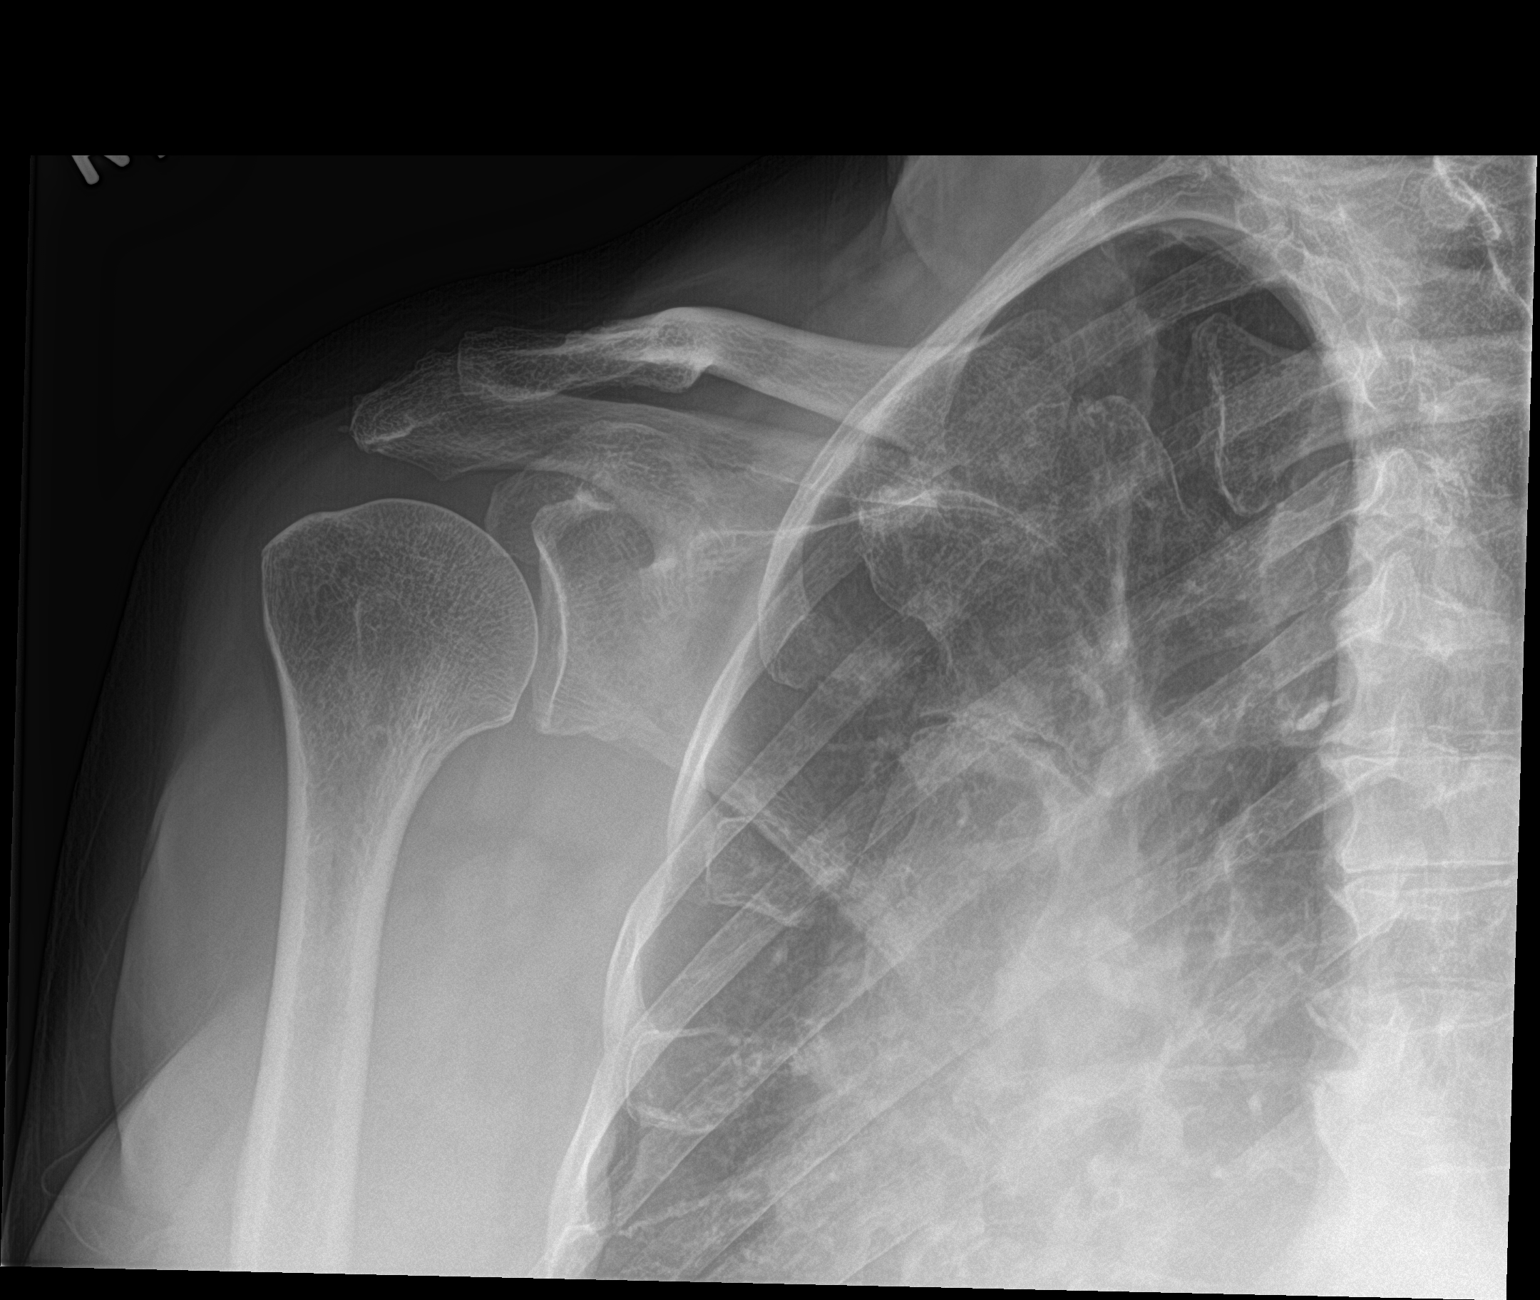

[shoulder axillary]
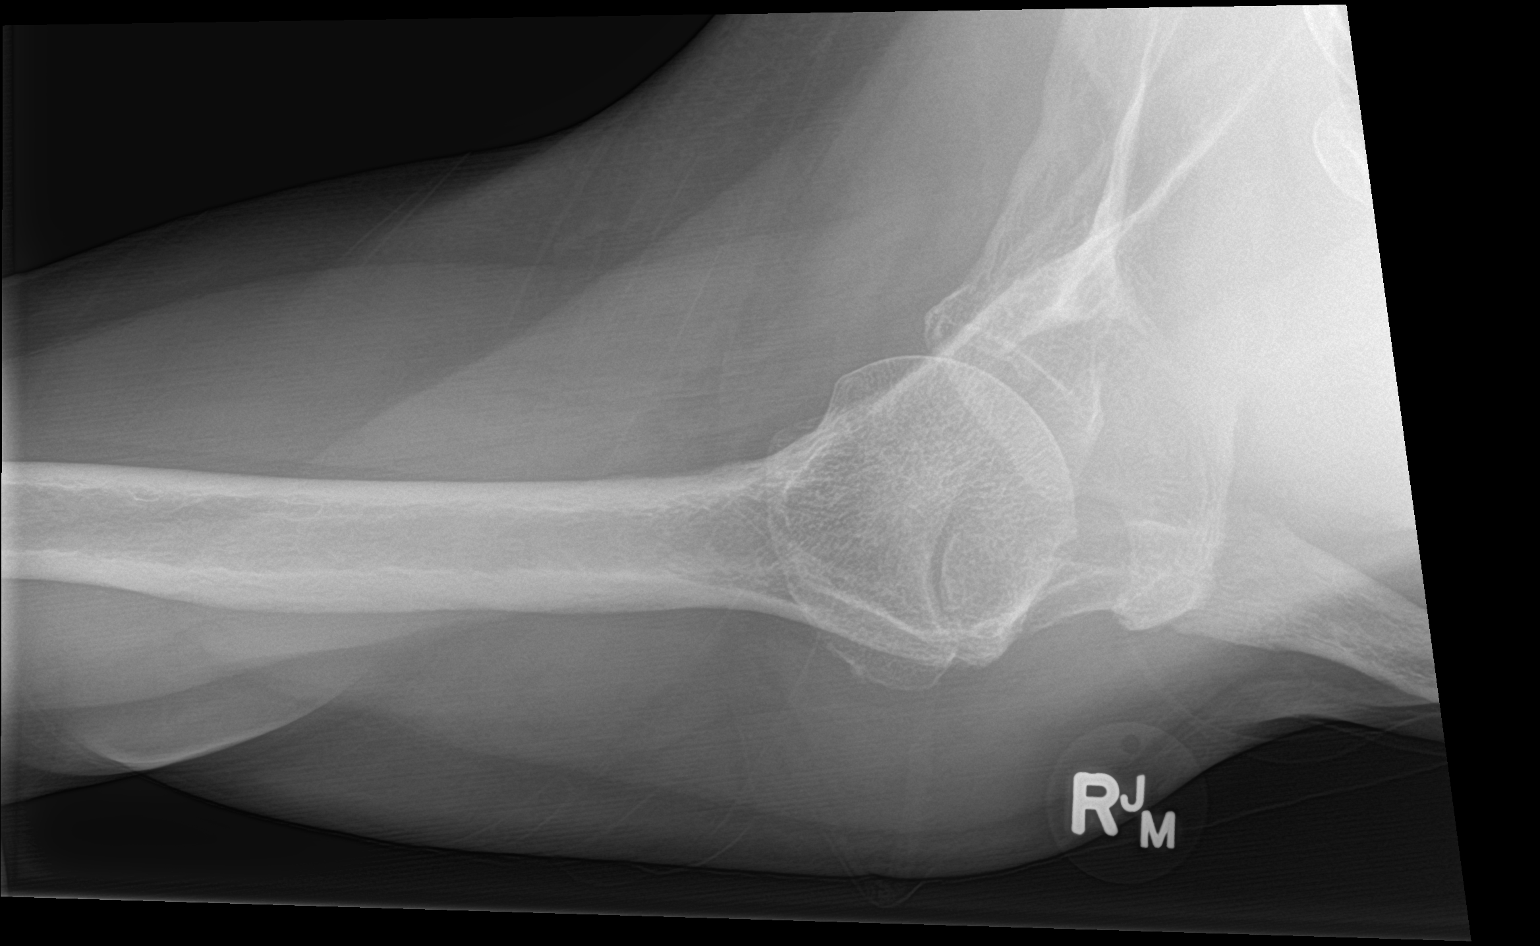

[shoulder y view]
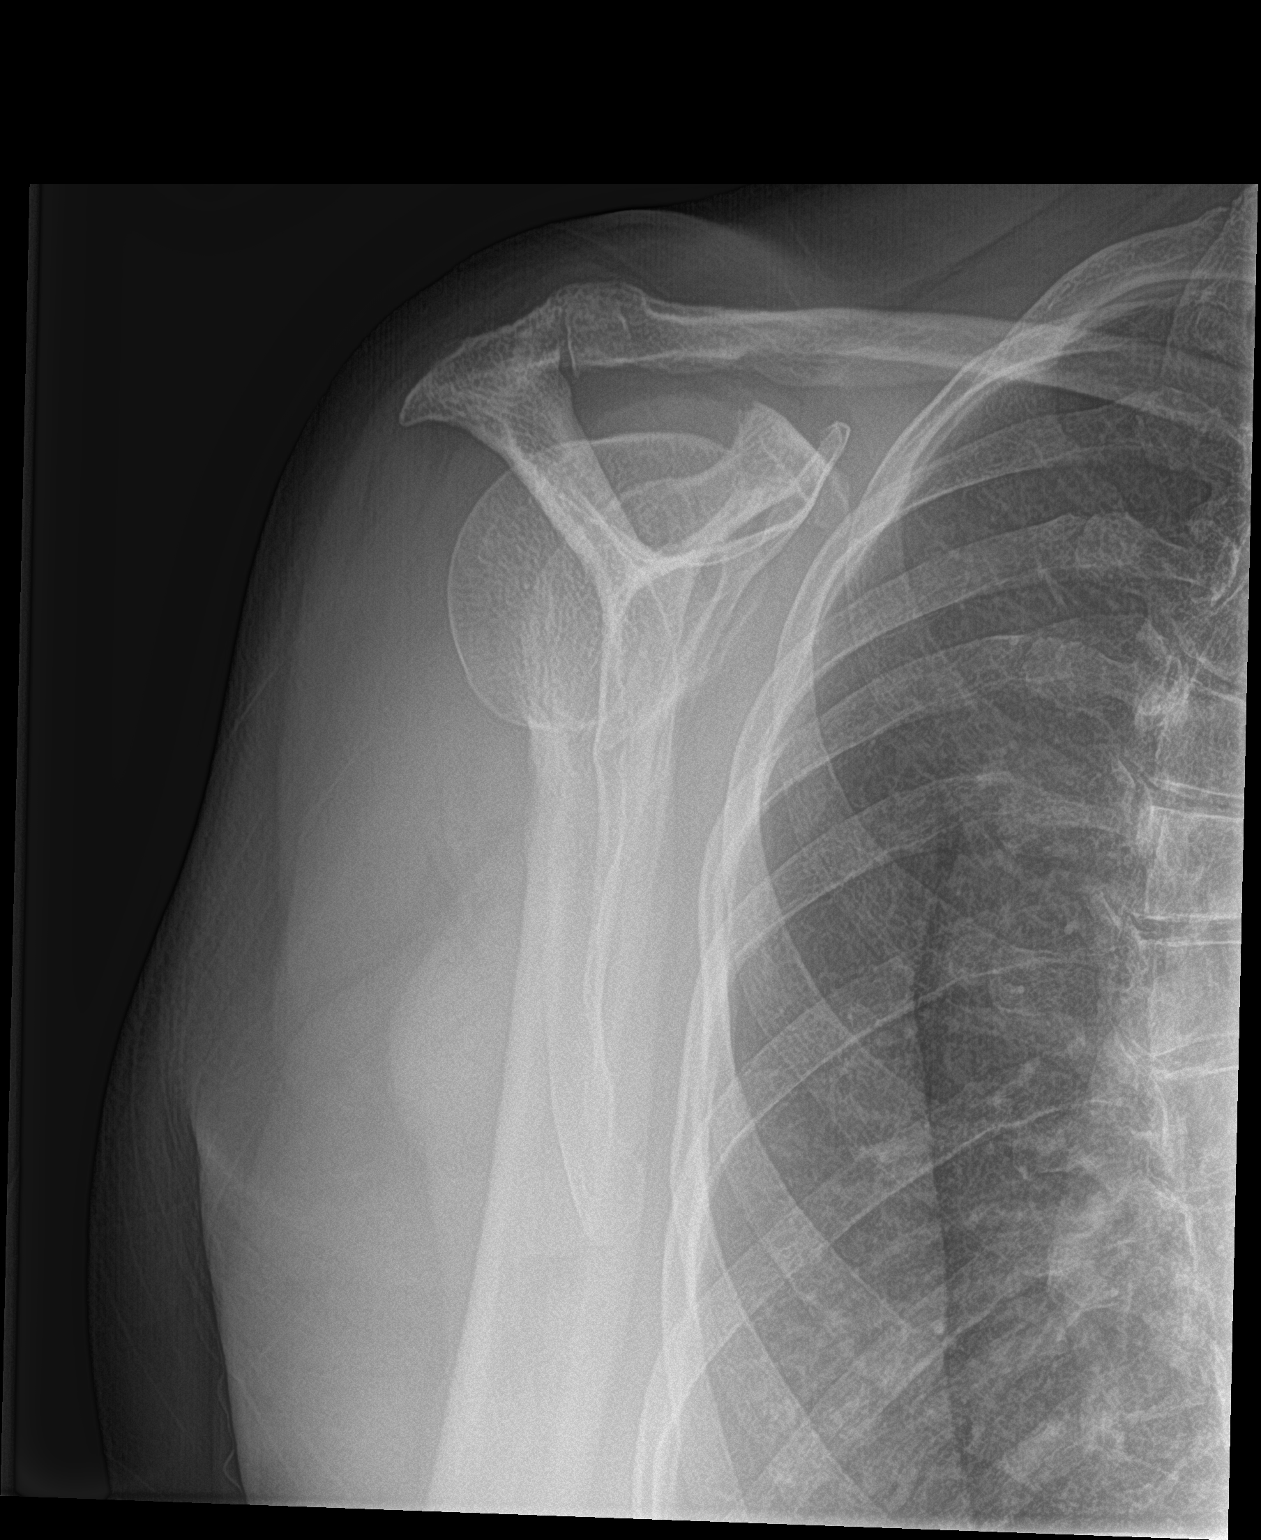

[3 of 3 positions shown; findings below may reference images not displayed]

FINDINGS: Mild acromioclavicular and glenohumeral degenerative change. No
acute bony or joint abnormality identified. No evidence of fracture
or dislocation.
IMPRESSION: Mild degenerative change.  No acute abnormality.

## 2019-01-03 ENCOUNTER — Telehealth: Payer: Self-pay | Admitting: *Deleted

## 2019-01-03 NOTE — Telephone Encounter (Signed)
Patient notified of results.   Copied from [place] [id number]. Topic: General - Other >> Dec 31, 2018  4:23 PM Leward Quan A wrote: Reason for CRM: Patient called to get the result of labs done on 12/29/2018. Please call Ph# [phone]

## 2019-01-05 ENCOUNTER — Telehealth: Payer: Self-pay | Admitting: Medical

## 2019-01-05 NOTE — Telephone Encounter (Signed)
Called Brian [name] and discussed appt. Brian [name] stating that [facility] ordered MRI and it cannot be done in [place]. Brian [name] needs all appts in [facility] due to being partially blind and relying on public transportation. Brian [name] declines appt at this time and states he may have to change to [place] so everything can be done in [place] for him.     Copied from [place] [phone]. Topic: Appointment Scheduling - Scheduling Inquiry for Clinic >> Jan 05, 2019 11:11 AM Nils Flack wrote: Reason for CRM: [phone] Brian [name] would like appt for headaches thurs or fri if possible  No answer on office line

## 2019-01-06 NOTE — Telephone Encounter (Signed)
Noted. Will discuss with him on next appointment.

## 2019-01-21 ENCOUNTER — Ambulatory Visit (INDEPENDENT_AMBULATORY_CARE_PROVIDER_SITE_OTHER): Payer: MEDICARE | Admitting: Medical

## 2019-01-21 ENCOUNTER — Encounter: Payer: Self-pay | Admitting: Medical

## 2019-01-21 ENCOUNTER — Other Ambulatory Visit: Payer: Self-pay

## 2019-01-21 VITALS — BP 130/78 | HR 58 | Temp 96.6°F | Resp 16 | Ht 64.0 in | Wt 199.0 lb

## 2019-01-21 DIAGNOSIS — G44209 Tension-type headache, unspecified, not intractable: Secondary | ICD-10-CM | POA: Diagnosis not present

## 2019-01-21 DIAGNOSIS — Z23 Encounter for immunization: Secondary | ICD-10-CM

## 2019-01-21 DIAGNOSIS — I2510 Atherosclerotic heart disease of native coronary artery without angina pectoris: Secondary | ICD-10-CM | POA: Diagnosis not present

## 2019-01-21 MED ORDER — SUCRALFATE 1 G PO TABS: 1.0000 g | ORAL_TABLET | Freq: Three times a day (TID) | ORAL | 0 refills | Status: DC

## 2019-01-21 MED ORDER — KETOROLAC TROMETHAMINE 30 MG/ML IJ SOLN
30.0000 mg | Freq: Once | INTRAMUSCULAR | Status: AC
  Administered 2019-01-21: 09:00:00 30 mg via INTRAMUSCULAR

## 2019-01-21 NOTE — Patient Instructions (Addendum)
I think your recent headache might represent tension type headache since you do have some trapezius area tenderness associated with the headache.  He had very good neurologic exam today blood pressure is normal.  For mild headache presently we gave you Toradol 30 mg IM injection.  You have MRI of head order already placed and when that is done in the near future that will give [place] a lot of information for potential causes of headache.  I do think that study will come back negative.  If you have any severe headache with associated signs/symptoms then recommend ED evaluation.  Also would recommend when you are at home if you have moderate to severe headache then check bp.  If future ha last more than 15 minutes with trapezius tenderness then could offer low dose flexeril with combination of tylenol of low dose ibuprofen.  Flu vaccine today.  Follow 2 months or as needed.  Ask radiology if you can get mri order by neuro at [facility].

## 2019-01-21 NOTE — Progress Notes (Signed)
Subjective:    Patient ID: Brian [name], male    DOB: 04-Mar-1946, 73 y.o.   MRN: [id number]  HPI  Pt in for hx of recent  Ha.   Ha on and off. States daily. He state not throbbing but pressure in back of his head. No light sensitivity,no nausea, or vomting. No dizziness. Ha for 2 months on and off. When gets ha will last 10-15 minutes. Will just lay down and ha will subside.  Pt has bp cuff at home. He never checks bp when has ha.  Pt saw neurologist for memory concerns. Mri of his head order placed. Pt states no ride to get study done in [place]. MRI truck coming back to med center end of the month.  Sometimes with ha pain neck rt side.    Review of Systems  Constitutional: Negative for chills, fatigue and fever.  Respiratory: Negative for cough, chest tightness and wheezing.   Cardiovascular: Negative for chest pain and palpitations.  Gastrointestinal: Negative for abdominal pain.  Genitourinary: Negative for dysuria, flank pain and frequency.  Musculoskeletal: Negative for back pain.  Skin: Negative for rash.  Neurological: Negative for dizziness, seizures, syncope, facial asymmetry, speech difficulty, weakness and light-headedness.       Faint ha now at best.  Hematological: Negative for adenopathy. Does not bruise/bleed easily.  Psychiatric/Behavioral: Negative for behavioral problems, decreased concentration, hallucinations and suicidal ideas.    Past Medical History:  Diagnosis Date  . Atypical chest pain     Negative cardiac catheterization 2014  . Barrett's esophagus   . 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 Blind right eye   . 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 Chicken pox   . Chronic back pain   . Dislocation, jaw 1960s  . Edema    Left Leg  . Essential hypertension, benign   . GERD (gastroesophageal reflux disease)   . [place] measles   . Heart attack ([place])   . Hyperlipidemia   . Measles   . Mumps   . Osteopenia 11.12.10   bone  density test  . Personal history of other diseases of digestive system 08.10.12   Gastric ulcer     Social History   Socioeconomic History  . Marital status: Single    Spouse name: Not on file  . Number of children: 0  . Years of education: Not on file  . Highest education level: Not on file  Occupational History  . Not on file  Social Needs  . Financial resource strain: Not on file  . Food insecurity    Worry: Not on file    Inability: Not on file  . Transportation needs    Medical: Not on file    Non-medical: Not on file  Tobacco Use  . Smoking status: Former Smoker    Packs/day: 0.25    Years: 2.00    Pack years: 0.50    Types: Cigarettes    Quit date: 06/02/1966    Years since quitting: 52.6  . Smokeless tobacco: Never Used  Substance and Sexual Activity  . Alcohol use: No    Alcohol/week: 0.0 standard drinks  . Drug use: No  . Sexual activity: Never  Lifestyle  . Physical activity    Days per week: Not on file    Minutes per session: Not on file  . Stress: Not on file  Relationships  . Social Herbalist on phone: Not on file    Gets together:  Not on file    Attends religious service: Not on file    Active member of club or organization: Not on file    Attends meetings of clubs or organizations: Not on file    Relationship status: Not on file  . Intimate partner violence    Fear of current or ex partner: Not on file    Emotionally abused: Not on file    Physically abused: Not on file    Forced sexual activity: Not on file  Other Topics Concern  . Not on file  Social History Narrative  . Not on file    Past Surgical History:  Procedure Laterality Date  . ABDOMINAL HERNIA REPAIR  1972  . APPENDECTOMY  1973  . BACK SURGERY  10/28/2017  . CATARACT EXTRACTION, BILATERAL  2005  . CHOLECYSTECTOMY  08/2016  . COLONOSCOPY WITH ESOPHAGOGASTRODUODENOSCOPY (EGD)     about 2016. [place] [facility]  . ESOPHAGOGASTRODUODENOSCOPY  06.2011  . EYE  SURGERY  1972   Bilateral Lens Replacement  . FACIAL RECONSTRUCTION SURGERY  2008  . LEFT HEART CATH  02.17.2014  . [place], 2008   Dislocated Jaw  . PARS PLANA VITRECTOMY W/ REPAIR OF MACULAR HOLE  2005   macular hole repair, right eye  . PARS PLANA VITRECTOMY W/ REPAIR OF MACULAR HOLE  2010   mechanical, left eye  . PROSTATE BIOPSY  [place] 2017   w/ Dr. [place] Crane  . WISDOM TOOTH EXTRACTION      Family History  Problem Relation Age of Onset  . Alzheimer's disease Mother        Deceased  . Diabetes Maternal Grandmother   . Heart attack Maternal Grandmother   . Prostate cancer Maternal Uncle   . Diabetes Maternal Uncle   . Healthy Son   . Cataracts Maternal Aunt   . Colon cancer Neg Hx   . Esophageal cancer Neg Hx     Allergies  Allergen Reactions  . Gabapentin Other (See Comments)    Feel "weird"; Nausea; Weakness; Syncope    Current Outpatient Medications on File Prior to Visit  Medication Sig Dispense Refill  . aspirin (ASPIR-81) 81 MG EC tablet Take 1 tablet by mouth daily.    Marland Kitchen b complex vitamins tablet Take 1 tablet by mouth daily.    . Calcium Carbonate-Vitamin D (CALCIUM-VITAMIN D3 PO) Take by mouth daily.    . cephALEXin (KEFLEX) 500 MG capsule     . hydrochlorothiazide (MICROZIDE) 12.5 MG capsule Take 1 capsule (12.5 mg total) by mouth daily. 90 capsule 3  . HYDROcodone-acetaminophen (NORCO/VICODIN) 5-325 MG tablet     . pantoprazole (PROTONIX) 40 MG tablet Take 1 tablet (40 mg total) by mouth 2 (two) times daily. 180 tablet 3  . rosuvastatin (CRESTOR) 20 MG tablet Take 1 tablet (20 mg total) by mouth daily. 90 tablet 3  . tamsulosin (FLOMAX) 0.4 MG CAPS capsule Take 2 capsules (0.8 mg total) by mouth daily. 90 capsule 1  . timolol (TIMOPTIC) 0.5 % ophthalmic solution Place 1 drop into the left eye daily. 10 mL 12  . traMADol (ULTRAM) 50 MG tablet 1 tab po q 8 hours prn pain 15 tablet 0   No current facility-administered medications on file prior to  visit.     BP 140/72   Pulse (!) 58   Temp (!) 96.6 F (35.9 C) (Temporal)   Resp 16   Ht 5\' 4"  (1.626 m)   Wt 199 lb (90.3 kg)  SpO2 97%   BMI 34.16 kg/m       Objective:   Physical Exam  General Mental Status- Alert. General Appearance- Not in acute distress.   Skin General: Color- Normal Color. Moisture- Normal Moisture.  Neck Carotid Arteries- Normal color. Moisture- Normal Moisture. No carotid bruits. No JVD. Mild rt trapezius tenderness. Rt side paracervical tenderness.  Chest and Lung Exam Auscultation: Breath Sounds:-Normal.  Cardiovascular Auscultation:Rythm- Regular. Murmurs & Other Heart Sounds:Auscultation of the heart reveals- No Murmurs.  Abdomen Inspection:-Inspeection Normal. Palpation/Percussion:Note:No mass. Palpation and Percussion of the abdomen reveal- Non Tender, Non Distended + BS, no rebound or guarding.  Neurologic Cranial Nerve exam:- CN III-XII intact(No nystagmus), symmetric smile. Drift Test:- No drift. Romberg Exam:- Negative.  Heal to Toe Gait exam:-Normal. Finger to Nose:- Normal/Intact  Strength:- 5/5 equal and symmetric strength both upper and lower extremities.      Assessment & Plan:  I think your recent headache might represent tension type headache since you do have some trapezius area tenderness associated with the headache.  He had very good neurologic exam today blood pressure is normal.  For mild headache presently we gave you Toradol 30 mg IM injection.  You have MRI of head order already placed and when that is done in the near future that will give [place] a lot of information for potential causes of headache.  I do think that study will come back negative.  If you have any severe headache with associated signs/symptoms then recommend ED evaluation.  Also would recommend when you are at home if you have moderate to severe headache then check bp.  If future ha last more than 15 minutes with trapezius tenderness then  could offer low dose flexeril with combination of tylenol of low dose ibuprofen.  Flu vaccine today.  Follow 2 months or as needed.  Ask radiology if you can get mri order by neuro at [facility].  25 minutes spent with pt. 50% of time spent counseling on plan going forward.  Mackie Pai, PA-C

## 2019-01-22 ENCOUNTER — Other Ambulatory Visit: Payer: MEDICARE

## 2019-02-01 ENCOUNTER — Other Ambulatory Visit: Payer: Self-pay

## 2019-02-02 ENCOUNTER — Ambulatory Visit (INDEPENDENT_AMBULATORY_CARE_PROVIDER_SITE_OTHER): Payer: MEDICARE | Admitting: Medical

## 2019-02-02 ENCOUNTER — Ambulatory Visit (HOSPITAL_BASED_OUTPATIENT_CLINIC_OR_DEPARTMENT_OTHER)
Admission: RE | Admit: 2019-02-02 | Discharge: 2019-02-02 | Disposition: A | Discharge status: Home / Self Care | Payer: MEDICARE | Source: Ambulatory Visit | Attending: Medical | Admitting: Medical

## 2019-02-02 VITALS — BP 132/81 | HR 65 | Temp 96.5°F | Resp 16 | Ht 64.0 in | Wt 192.0 lb

## 2019-02-02 DIAGNOSIS — R51 Headache: Secondary | ICD-10-CM | POA: Insufficient documentation

## 2019-02-02 DIAGNOSIS — R519 Headache, unspecified: Secondary | ICD-10-CM

## 2019-02-02 DIAGNOSIS — H814 Vertigo of central origin: Secondary | ICD-10-CM | POA: Diagnosis present

## 2019-02-02 DIAGNOSIS — K219 Gastro-esophageal reflux disease without esophagitis: Secondary | ICD-10-CM

## 2019-02-02 DIAGNOSIS — G44209 Tension-type headache, unspecified, not intractable: Secondary | ICD-10-CM

## 2019-02-02 DIAGNOSIS — I2510 Atherosclerotic heart disease of native coronary artery without angina pectoris: Secondary | ICD-10-CM

## 2019-02-02 DIAGNOSIS — F329 Major depressive disorder, single episode, unspecified: Secondary | ICD-10-CM | POA: Diagnosis not present

## 2019-02-02 DIAGNOSIS — F32A Depression, unspecified: Secondary | ICD-10-CM

## 2019-02-02 MED ORDER — FAMOTIDINE 20 MG PO TABS: 20.0000 mg | ORAL_TABLET | Freq: Every day | ORAL | 3 refills | Status: DC

## 2019-02-02 MED ORDER — CYCLOBENZAPRINE HCL 5 MG PO TABS: ORAL_TABLET | ORAL | 0 refills | Status: DC

## 2019-02-02 MED ORDER — SERTRALINE HCL 25 MG PO TABS: 25.0000 mg | ORAL_TABLET | Freq: Every day | ORAL | 3 refills | Status: DC

## 2019-02-02 MED ORDER — MECLIZINE HCL 12.5 MG PO TABS: 12.5000 mg | ORAL_TABLET | Freq: Three times a day (TID) | ORAL | 0 refills | Status: AC | PRN

## 2019-02-02 MED FILL — CYCLOBENZAPRINE HCL 5 MG TA: 5 | 10 days supply | Qty: 10 | Fill #0

## 2019-02-02 MED FILL — SERTRALINE HCL 25 MG TABLET: 25 | 30 days supply | Qty: 30 | Fill #0

## 2019-02-02 MED FILL — MECLIZINE 12.5 MG CAPLET: 12.5 | 30 days supply | Qty: 100 | Fill #0

## 2019-02-02 MED FILL — FAMOTIDINE 20 MG TABLET: 20 | 30 days supply | Qty: 30 | Fill #0

## 2019-02-02 NOTE — Progress Notes (Signed)
Subjective:    Patient ID: Brian [name], male    DOB: 1946-01-31, 73 y.o.   MRN: [id number]  HPI  Patient here to follow-up on his headaches. States there has not been much change since his last visit. Reports several headaches per week. Some days he will be fine, others he will have multiple headaches. Headaches feel like pressure 5/10 on average and usually last 15-20 minutes. Only relief is trying to lay down and relax. Last visit states tramadol stopped ha day of visit. When he used muscle relaxant would get relief from ha for about 8 hours. Last visit thought some tension ha features.  Reports that he his been "stumbling" a few times every day, but has never fallen to the ground. Only associated symptom at these times is dizziness/lightheadedness.  About 2 weeks age he reports having stimulator placed for neck pain. This has resolved the neck pain. No complaints today.   He has been seeing a neurologist for recent memory impairment and they have ordered an MRI. Patient does not report any new changes with memory. States he is in the process of scheduling the MRI in [place]. That office called him this morning and he will talk to radiology tech later today.  Patient states he has not been checking his blood pressure at home.   States he has been taking all of his medications as prescribed with no side effects. Reports poor appetite, denies nausea.   Patient states he has been feeling down on and off for about a year. States he has had transient  thoughts of harming himself but will not do it because he is afraid he will be unsuccessful and "end up like a vegetable." States he does not want to see anyone for help because he does not think it will help. His mood has gotten worse in past couple of months. He states transportation issues bother him. Previously was going to church but not attending limited service due to pandemic. Prior to pandemic back pain prevented him from going. Pt  strong religious belief prevent him from seriously considering. Pt willing to try sertaline for depression. He dos not want to counseling.   Review of Systems  Constitutional: Positive for fatigue. Negative for chills and fever.  HENT: Negative for ear pain and sinus pain.   Eyes: Negative for visual disturbance.  Respiratory: Negative for chest tightness and shortness of breath.        States he is supposed to sleep with CPAP but does not like it  Cardiovascular: Negative for chest pain and palpitations.  Gastrointestinal: Negative for abdominal pain, blood in stool, constipation and diarrhea.  Genitourinary: Negative for dysuria and hematuria.  Musculoskeletal: Positive for arthralgias. Negative for neck pain.       Chronic knee pain/aching - arthritis  Skin: Negative for rash.  Neurological: Positive for headaches. Negative for dizziness, seizures, speech difficulty, weakness, light-headedness and numbness.       5/10 headache described as pressure that begins posterior/occipital region and moves across the top to front of the head  Reports occasional dizziness/lightheadedness that causes him to stumble, happens about 3-4x/day  No dizziness presently.  Psychiatric/Behavioral: Positive for dysphoric mood and sleep disturbance. Negative for behavioral problems and suicidal ideas. The patient is not nervous/anxious.        Reports feeling down for the past 3-4 months, states he has thoughts of harming himself but knows he would never do anything  Trouble sleeping esophagitis and feeling like  he can't catch is breath (does not like his CPAP - states it makes it feel like he is suffocating). Sleeps on 2 pillows     Past Medical History:  Diagnosis Date  . Atypical chest pain     Negative cardiac catheterization 2014  . Barrett's esophagus   . 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 Blind right eye   . 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 Chicken pox   . Chronic back  pain   . Dislocation, jaw 1960s  . Edema    Left Leg  . Essential hypertension, benign   . GERD (gastroesophageal reflux disease)   . [place] measles   . Heart attack ([place])   . Hyperlipidemia   . Measles   . Mumps   . Osteopenia 11.12.10   bone density test  . Personal history of other diseases of digestive system 08.10.12   Gastric ulcer     Social History   Socioeconomic History  . Marital status: Single    Spouse name: Not on file  . Number of children: 0  . Years of education: Not on file  . Highest education level: Not on file  Occupational History  . Not on file  Social Needs  . Financial resource strain: Not on file  . Food insecurity    Worry: Not on file    Inability: Not on file  . Transportation needs    Medical: Not on file    Non-medical: Not on file  Tobacco Use  . Smoking status: Former Smoker    Packs/day: 0.25    Years: 2.00    Pack years: 0.50    Types: Cigarettes    Quit date: 06/02/1966    Years since quitting: 52.7  . Smokeless tobacco: Never Used  Substance and Sexual Activity  . Alcohol use: No    Alcohol/week: 0.0 standard drinks  . Drug use: No  . Sexual activity: Never  Lifestyle  . Physical activity    Days per week: Not on file    Minutes per session: Not on file  . Stress: Not on file  Relationships  . Social Herbalist on phone: Not on file    Gets together: Not on file    Attends religious service: Not on file    Active member of club or organization: Not on file    Attends meetings of clubs or organizations: Not on file    Relationship status: Not on file  . Intimate partner violence    Fear of current or ex partner: Not on file    Emotionally abused: Not on file    Physically abused: Not on file    Forced sexual activity: Not on file  Other Topics Concern  . Not on file  Social History Narrative  . Not on file    Past Surgical History:  Procedure Laterality Date  . ABDOMINAL HERNIA REPAIR  1972  .  APPENDECTOMY  1973  . BACK SURGERY  10/28/2017  . CATARACT EXTRACTION, BILATERAL  2005  . CHOLECYSTECTOMY  08/2016  . COLONOSCOPY WITH ESOPHAGOGASTRODUODENOSCOPY (EGD)     about 2016. [place] [facility]  . ESOPHAGOGASTRODUODENOSCOPY  06.2011  . EYE SURGERY  1972   Bilateral Lens Replacement  . FACIAL RECONSTRUCTION SURGERY  2008  . LEFT HEART CATH  02.17.2014  . [place], 2008   Dislocated Jaw  . PARS PLANA VITRECTOMY W/ REPAIR OF MACULAR HOLE  2005   macular hole  repair, right eye  . PARS PLANA VITRECTOMY W/ REPAIR OF MACULAR HOLE  2010   mechanical, left eye  . PROSTATE BIOPSY  [place] 2017   w/ Dr. Nevada Crane  . WISDOM TOOTH EXTRACTION      Family History  Problem Relation Age of Onset  . Alzheimer's disease Mother        Deceased  . Diabetes Maternal Grandmother   . Heart attack Maternal Grandmother   . Prostate cancer Maternal Uncle   . Diabetes Maternal Uncle   . Healthy Son   . Cataracts Maternal Aunt   . Colon cancer Neg Hx   . Esophageal cancer Neg Hx     Allergies  Allergen Reactions  . Gabapentin Other (See Comments)    Feel "weird"; Nausea; Weakness; Syncope    Current Outpatient Medications on File Prior to Visit  Medication Sig Dispense Refill  . aspirin (ASPIR-81) 81 MG EC tablet Take 1 tablet by mouth daily.    Marland Kitchen b complex vitamins tablet Take 1 tablet by mouth daily.    . Calcium Carbonate-Vitamin D (CALCIUM-VITAMIN D3 PO) Take by mouth daily.    . cephALEXin (KEFLEX) 500 MG capsule     . hydrochlorothiazide (MICROZIDE) 12.5 MG capsule Take 1 capsule (12.5 mg total) by mouth daily. 90 capsule 3  . HYDROcodone-acetaminophen (NORCO/VICODIN) 5-325 MG tablet     . pantoprazole (PROTONIX) 40 MG tablet Take 1 tablet (40 mg total) by mouth 2 (two) times daily. 180 tablet 3  . rosuvastatin (CRESTOR) 20 MG tablet Take 1 tablet (20 mg total) by mouth daily. 90 tablet 3  . sucralfate (CARAFATE) 1 g tablet Take 1 tablet (1 g total) by mouth 4 (four) times  daily -  with meals and at bedtime. 120 tablet 0  . tamsulosin (FLOMAX) 0.4 MG CAPS capsule Take 2 capsules (0.8 mg total) by mouth daily. 90 capsule 1  . timolol (TIMOPTIC) 0.5 % ophthalmic solution Place 1 drop into the left eye daily. 10 mL 12  . traMADol (ULTRAM) 50 MG tablet 1 tab po q 8 hours prn pain 15 tablet 0   No current facility-administered medications on file prior to visit.     BP 132/81   Pulse 65   Temp (!) 96.5 F (35.8 C) (Temporal)   Resp 16   Ht 5\' 4"  (1.626 m)   Wt 192 lb (87.1 kg)   SpO2 (!) 64%   BMI 32.96 kg/m       Objective:   Physical Exam  General Mental Status- Alert. General Appearance- Not in acute distress.   Skin General: Color- Normal Color. Moisture- Normal Moisture.  Neck Carotid Arteries- Normal color. Moisture- Normal Moisture. No carotid bruits. No JVD.  Chest and Lung Exam Auscultation: Breath Sounds:-Normal.  Cardiovascular Auscultation:Rythm- Regular. Murmurs & Other Heart Sounds:Auscultation of the heart reveals- No Murmurs.  Abdomen Inspection:-Inspeection Normal. Palpation/Percussion:Note:No mass. Palpation and Percussion of the abdomen reveal- Non Tender, Non Distended + BS, no rebound or guarding.    Neurologic Cranial Nerve exam:- CN III-XII intact(No nystagmus), symmetric smile. Normal/Intact Strength:- 5/5 equal and symmetric strength both upper and lower extremities.      Assessment & Plan:  Moderate to severe depression symptoms I am prescribing sertraline low dose.  We will see how you do with 25mg  daily tablet but want you to follow-up in 2 weeks as you might need increased to 50 mg dose.  You do report some intermittent thoughts of harm to self but expressed no current  plan and described religious beliefs that would prohibit you from doing harm to yourself.  No active thoughts/serious thoughts presently.  If symptoms worsen or change then need to be evaluated at [facility] long emergency department.  In that  event you could see psychiatrist.  If you change your mind regarding counseling please let me know.  Some of your headache features seem to be tension headache related as you had brief response to Flexeril.  Going to give you refills today but also want you to go ahead and try to arrange for your MRI to be done within the next 24 hours.  If they cannot do the test within the next 24 hours then I want you to notify me and we will go ahead and get CT of your head without contrast today.  Will make meclizine available for persistent dizziness that last more than 10 minutes.  Your fatigue might be related to your mood as recent labs were negative.  Follow-up in 2 weeks or as needed.  40 minutes spent with pt. 50% of time spent counseling pt on plan going forward.

## 2019-02-02 NOTE — Patient Instructions (Addendum)
Moderate to severe depression symptoms I am prescribing sertraline low dose.  We will see how you do with 25mg  daily tablet but want you to follow-up in 2 weeks as you might need increased to 50 mg dose.  You do report some intermittent thoughts of harm to self but expressed no current plan and described religious beliefs that would prohibit you from doing harm to yourself.  No active thoughts/serious thoughts presently.  If symptoms worsen or change then need to be evaluated at [facility] long emergency department.  In that event you could see psychiatrist.  If you change your mind regarding counseling please let me know.  Some of your headache features seem to be tension headache related as you had brief response to Flexeril.  Going to give you refills today but also want you to go ahead and try to arrange for your MRI to be done within the next 24 hours.  If they cannot do the test within the next 24 hours then I want you to notify me and we will go ahead and get CT of your head without contrast today.  Will make meclizine available for persistent dizziness that last more than 10 minutes.  Your fatigue might be related to your mood as recent labs were negative.  Follow-up in 2 weeks or as needed.

## 2019-02-03 ENCOUNTER — Telehealth: Payer: Self-pay | Admitting: Neurology

## 2019-02-03 NOTE — Telephone Encounter (Signed)
Patient called and said he is due to have an MRI but needs the order for it to be sent to [facility]. He said he has had a spinal cord implant or an SCS implant inserted since the test was ordered. He said [facility] is the only facility that can do the test with this device in place.

## 2019-02-04 NOTE — Telephone Encounter (Signed)
Left message for [facility] to return call to see where to schedule pt since he has a SNS stimulator.

## 2019-02-04 NOTE — Telephone Encounter (Signed)
MRI order printed. Need Aquino to sign order and will fax to [facility].

## 2019-02-08 NOTE — Telephone Encounter (Signed)
Dr. Delice Lesch needs to sign the order then it will be faxed to [facility]. Dr Delice Lesch will be in the office tomorrow.

## 2019-02-08 NOTE — Telephone Encounter (Signed)
Patient called to check on the status of the request and he'd like a call back please once the process is complete so he can schedule the appointment.

## 2019-02-10 NOTE — Telephone Encounter (Signed)
Order faxed to [facility] at [phone]. They will contact pt to schedule.

## 2019-04-19 ENCOUNTER — Other Ambulatory Visit: Payer: Self-pay

## 2019-04-20 ENCOUNTER — Ambulatory Visit (INDEPENDENT_AMBULATORY_CARE_PROVIDER_SITE_OTHER): Payer: MEDICARE | Admitting: Medical

## 2019-04-20 ENCOUNTER — Other Ambulatory Visit: Payer: Self-pay

## 2019-04-20 ENCOUNTER — Ambulatory Visit (HOSPITAL_BASED_OUTPATIENT_CLINIC_OR_DEPARTMENT_OTHER)
Admission: RE | Admit: 2019-04-20 | Discharge: 2019-04-20 | Disposition: A | Discharge status: Home / Self Care | Payer: MEDICARE | Source: Ambulatory Visit | Attending: Medical | Admitting: Medical

## 2019-04-20 VITALS — BP 126/84 | HR 61 | Temp 96.2°F | Resp 16 | Ht 64.0 in | Wt 198.8 lb

## 2019-04-20 DIAGNOSIS — M791 Myalgia, unspecified site: Secondary | ICD-10-CM | POA: Diagnosis not present

## 2019-04-20 DIAGNOSIS — R05 Cough: Secondary | ICD-10-CM | POA: Diagnosis not present

## 2019-04-20 DIAGNOSIS — R519 Headache, unspecified: Secondary | ICD-10-CM

## 2019-04-20 DIAGNOSIS — R059 Cough, unspecified: Secondary | ICD-10-CM

## 2019-04-20 DIAGNOSIS — G8929 Other chronic pain: Secondary | ICD-10-CM

## 2019-04-20 DIAGNOSIS — I2510 Atherosclerotic heart disease of native coronary artery without angina pectoris: Secondary | ICD-10-CM | POA: Diagnosis not present

## 2019-04-20 DIAGNOSIS — M545 Low back pain, unspecified: Secondary | ICD-10-CM

## 2019-04-20 DIAGNOSIS — L299 Pruritus, unspecified: Secondary | ICD-10-CM

## 2019-04-20 DIAGNOSIS — K219 Gastro-esophageal reflux disease without esophagitis: Secondary | ICD-10-CM

## 2019-04-20 LAB — CBC WITH DIFFERENTIAL/PLATELET
Basophils Absolute: 0.1 10*3/uL (ref 0.0–0.1)
Basophils Relative: 1.4 % (ref 0.0–3.0)
Eosinophils Absolute: 0.2 10*3/uL (ref 0.0–0.7)
Eosinophils Relative: 4.9 % (ref 0.0–5.0)
HCT: 41.1 % (ref 39.0–52.0)
Hemoglobin: 13.4 g/dL (ref 13.0–17.0)
Lymphocytes Relative: 36.9 % (ref 12.0–46.0)
Lymphs Abs: 1.7 10*3/uL (ref 0.7–4.0)
MCHC: 32.5 g/dL (ref 30.0–36.0)
MCV: 86 fl (ref 78.0–100.0)
Monocytes Absolute: 0.6 10*3/uL (ref 0.1–1.0)
Monocytes Relative: 13.3 % — ABNORMAL HIGH (ref 3.0–12.0)
Neutro Abs: 2 10*3/uL (ref 1.4–7.7)
Neutrophils Relative %: 43.5 % (ref 43.0–77.0)
Platelets: 297 10*3/uL (ref 150.0–400.0)
RBC: 4.78 Mil/uL (ref 4.22–5.81)
RDW: 13.2 % (ref 11.5–15.5)
WBC: 4.6 10*3/uL (ref 4.0–10.5)

## 2019-04-20 LAB — POC URINALSYSI DIPSTICK (AUTOMATED)
Bilirubin, UA: NEGATIVE
Blood, UA: NEGATIVE
Glucose, UA: NEGATIVE
Ketones, UA: NEGATIVE
Leukocytes, UA: NEGATIVE
Nitrite, UA: NEGATIVE
Protein, UA: NEGATIVE
Spec Grav, UA: 1.015 (ref 1.010–1.025)
Urobilinogen, UA: NEGATIVE E.U./dL — AB
pH, UA: 7 (ref 5.0–8.0)

## 2019-04-20 LAB — COMPREHENSIVE METABOLIC PANEL
ALT: 22 U/L (ref 0–53)
AST: 22 U/L (ref 0–37)
Albumin: 4 g/dL (ref 3.5–5.2)
Alkaline Phosphatase: 67 U/L (ref 39–117)
BUN: 10 mg/dL (ref 6–23)
CO2: 33 mEq/L — ABNORMAL HIGH (ref 19–32)
Calcium: 9.4 mg/dL (ref 8.4–10.5)
Chloride: 99 mEq/L (ref 96–112)
Creatinine, Ser: 1.03 mg/dL (ref 0.40–1.50)
GFR: 85.1 mL/min (ref >60.00)
Glucose, Bld: 99 mg/dL (ref 70–99)
Potassium: 3.8 mEq/L (ref 3.5–5.1)
Sodium: 138 mEq/L (ref 135–145)
Total Bilirubin: 1.4 mg/dL — ABNORMAL HIGH (ref 0.2–1.2)
Total Protein: 6.5 g/dL (ref 6.0–8.3)

## 2019-04-20 LAB — LIPASE: Lipase: 19 U/L (ref 11.0–59.0)

## 2019-04-20 LAB — AMMONIA: Ammonia: 44 umol/L — ABNORMAL HIGH (ref 11–35)

## 2019-04-20 MED ORDER — SUCRALFATE 1 G PO TABS: 1.0000 g | ORAL_TABLET | Freq: Three times a day (TID) | ORAL | 3 refills | Status: DC

## 2019-04-20 MED ORDER — KETOROLAC TROMETHAMINE 30 MG/ML IJ SOLN
30.0000 mg | Freq: Once | INTRAMUSCULAR | Status: AC
  Administered 2019-04-20: 10:00:00 30 mg via INTRAMUSCULAR

## 2019-04-20 NOTE — Patient Instructions (Addendum)
For history of reflux, I want you to continue protonix twice daily, increase famotadine to 20 mg twice daily and add carafate to your regimen. If this does not help then will need to refer you back to your gi who did recent endoscopy.  For low level chronic intermittent ha, I do want you to go ahead and get mri as neurologist had ordered. If any severe HA with motor or sensory deficits then advise ED evaluluation.  Will get cmp, cbc, UA, ammonia level and lipase for you back and abdomen pain.  Toradol 30 mg im. This can give relief for mild ha and help with back pain. If this helps with both then might give nsaid rx but would give low dose as nsaids can effect stomach.  For cough will also get xray. Likely reflux related but with xray will evaluate lower lung fields since you report some pain in those areas.   For itching of upper back recommend to try moisturizer twice daily. Update me if that helps or not.  Follow up in 3 weeks or as needed

## 2019-04-20 NOTE — Progress Notes (Signed)
Subjective:    Patient ID: Brian [name], male    DOB: 08/20/1945, 73 y.o.   MRN: [id number]  HPI   Pt in for lower back pain both side. He points to cva area and lower lung areas. Not reporting mid lumber back pain or pain radiating to legs. Pt has hx of lumbar fusion surgery and he states stimulator placed. He thinks did not help much but he was told typically have to wait a while to know if that surgery will have worked.   No skin rash reported. Upper back itches at night.   Pt describes at times his throat feels dry and it causes severe cough.  He also reports reflux. He can still feel acid coming up his throat. He states burps and has sour taste. He feels like this does cause cough. States gerd since 1960's. Told had ulcer. He on both protonix and famotadine. Had egd done recently per pt through [facility].  He has hx of headache. Pt has seen neurologist. MRI was ordered.he states has to be done at [facility]. Pt finally got cousin to agree to take him. He has slight ha now. Lamonte Sakai come and goes. No gross motor or sensory deficits.     Review of Systems  Constitutional: Negative for chills, fatigue and fever.  HENT: Negative for congestion, ear pain, postnasal drip, rhinorrhea, sinus pressure and sinus pain.   Respiratory: Positive for cough. Negative for shortness of breath and wheezing.   Cardiovascular: Negative for chest pain and palpitations.  Gastrointestinal: Positive for abdominal pain. Negative for abdominal distention, constipation, diarrhea, nausea and vomiting.  Genitourinary: Negative for decreased urine volume, dysuria, flank pain, frequency and penile pain.  Musculoskeletal: Negative for back pain.  Skin: Negative for rash.  Neurological: Positive for headaches. Negative for dizziness, speech difficulty and weakness.       Chronic low level headache/pressure.  Hematological: Negative for adenopathy. Does not bruise/bleed easily.   Psychiatric/Behavioral: Negative for behavioral problems, confusion, sleep disturbance and suicidal ideas. The patient is not nervous/anxious.     Past Medical History:  Diagnosis Date  . Atypical chest pain     Negative cardiac catheterization 2014  . Barrett's esophagus   . 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 Blind right eye   . 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 Chicken pox   . Chronic back pain   . Dislocation, jaw 1960s  . Edema    Left Leg  . Essential hypertension, benign   . GERD (gastroesophageal reflux disease)   . [place] measles   . Heart attack ([place])   . Hyperlipidemia   . Measles   . Mumps   . Osteopenia 11.12.10   bone density test  . Personal history of other diseases of digestive system 08.10.12   Gastric ulcer     Social History   Socioeconomic History  . Marital status: Single    Spouse name: Not on file  . Number of children: 0  . Years of education: Not on file  . Highest education level: Not on file  Occupational History  . Not on file  Social Needs  . Financial resource strain: Not on file  . Food insecurity    Worry: Not on file    Inability: Not on file  . Transportation needs    Medical: Not on file    Non-medical: Not on file  Tobacco Use  . Smoking status: Former Smoker    Packs/day: 0.25  Years: 2.00    Pack years: 0.50    Types: Cigarettes    Quit date: 06/02/1966    Years since quitting: 52.9  . Smokeless tobacco: Never Used  Substance and Sexual Activity  . Alcohol use: No    Alcohol/week: 0.0 standard drinks  . Drug use: No  . Sexual activity: Never  Lifestyle  . Physical activity    Days per week: Not on file    Minutes per session: Not on file  . Stress: Not on file  Relationships  . Social Herbalist on phone: Not on file    Gets together: Not on file    Attends religious service: Not on file    Active member of club or organization: Not on file    Attends meetings of clubs or  organizations: Not on file    Relationship status: Not on file  . Intimate partner violence    Fear of current or ex partner: Not on file    Emotionally abused: Not on file    Physically abused: Not on file    Forced sexual activity: Not on file  Other Topics Concern  . Not on file  Social History Narrative  . Not on file    Past Surgical History:  Procedure Laterality Date  . ABDOMINAL HERNIA REPAIR  1972  . APPENDECTOMY  1973  . BACK SURGERY  10/28/2017  . CATARACT EXTRACTION, BILATERAL  2005  . CHOLECYSTECTOMY  08/2016  . COLONOSCOPY WITH ESOPHAGOGASTRODUODENOSCOPY (EGD)     about 2016. [place] [facility]  . ESOPHAGOGASTRODUODENOSCOPY  06.2011  . EYE SURGERY  1972   Bilateral Lens Replacement  . FACIAL RECONSTRUCTION SURGERY  2008  . LEFT HEART CATH  02.17.2014  . [place], 2008   Dislocated Jaw  . PARS PLANA VITRECTOMY W/ REPAIR OF MACULAR HOLE  2005   macular hole repair, right eye  . PARS PLANA VITRECTOMY W/ REPAIR OF MACULAR HOLE  2010   mechanical, left eye  . PROSTATE BIOPSY  [place] 2017   w/ Dr. [place] Crane  . WISDOM TOOTH EXTRACTION      Family History  Problem Relation Age of Onset  . Alzheimer's disease Mother        Deceased  . Diabetes Maternal Grandmother   . Heart attack Maternal Grandmother   . Prostate cancer Maternal Uncle   . Diabetes Maternal Uncle   . Healthy Son   . Cataracts Maternal Aunt   . Colon cancer Neg Hx   . Esophageal cancer Neg Hx     Allergies  Allergen Reactions  . Gabapentin Other (See Comments)    Feel "weird"; Nausea; Weakness; Syncope    Current Outpatient Medications on File Prior to Visit  Medication Sig Dispense Refill  . aspirin (ASPIR-81) 81 MG EC tablet Take 1 tablet by mouth daily.    Marland Kitchen b complex vitamins tablet Take 1 tablet by mouth daily.    . Calcium Carbonate-Vitamin D (CALCIUM-VITAMIN D3 PO) Take by mouth daily.    . cephALEXin (KEFLEX) 500 MG capsule     . cyclobenzaprine (FLEXERIL) 5 MG tablet  1 tab po q hs if needed neck pain/tension ha 10 tablet 0  . famotidine (PEPCID) 20 MG tablet Take 1 tablet (20 mg total) by mouth at bedtime. 30 tablet 3  . hydrochlorothiazide (MICROZIDE) 12.5 MG capsule Take 1 capsule (12.5 mg total) by mouth daily. 90 capsule 3  . HYDROcodone-acetaminophen (NORCO/VICODIN) 5-325 MG tablet     .  meclizine (ANTIVERT) 12.5 MG tablet Take 1 tablet (12.5 mg total) by mouth 3 (three) times daily as needed for dizziness. 21 tablet 0  . pantoprazole (PROTONIX) 40 MG tablet Take 1 tablet (40 mg total) by mouth 2 (two) times daily. 180 tablet 3  . rosuvastatin (CRESTOR) 20 MG tablet Take 1 tablet (20 mg total) by mouth daily. 90 tablet 3  . sertraline (ZOLOFT) 25 MG tablet Take 1 tablet (25 mg total) by mouth daily. 30 tablet 3  . sucralfate (CARAFATE) 1 g tablet Take 1 tablet (1 g total) by mouth 4 (four) times daily -  with meals and at bedtime. 120 tablet 0  . tamsulosin (FLOMAX) 0.4 MG CAPS capsule Take 2 capsules (0.8 mg total) by mouth daily. 90 capsule 1  . timolol (TIMOPTIC) 0.5 % ophthalmic solution Place 1 drop into the left eye daily. 10 mL 12  . traMADol (ULTRAM) 50 MG tablet 1 tab po q 8 hours prn pain 15 tablet 0   No current facility-administered medications on file prior to visit.     BP 126/84   Pulse 61   Temp (!) 96.2 F (35.7 C) (Temporal)   Resp 16   Ht 5\' 4"  (1.626 m)   Wt 198 lb 12.8 oz (90.2 kg)   SpO2 100%   BMI 34.12 kg/m       Objective:   Physical Exam  General Mental Status- Alert. General Appearance- Not in acute distress.   Skin General: Color- Normal Color. Moisture- Normal Moisture.  Neck Carotid Arteries- Normal color. Moisture- Normal Moisture. No carotid bruits. No JVD.  Chest and Lung Exam Auscultation: Breath Sounds:-Normal.  Cardiovascular Auscultation:Rythm- Regular. Murmurs & Other Heart Sounds:Auscultation of the heart reveals- No Murmurs.  Abdomen Inspection:-Inspeection Normal.  Palpation/Percussion:Note:No mass. Palpation and Percussion of the abdomen reveal- Non Tender, Non Distended + BS, no rebound or guarding.   Neurologic Cranial Nerve exam:- CN III-XII intact(No nystagmus), symmetric smile. Strength:- 5/5 equal and symmetric strength both upper and lower extremities.  Back- no mid spine pain but pain paralumbar area upper cva area and lower lung field junction.(worse on rt side per pt)      Assessment & Plan:  For history of reflux, I want you to continue protonix twice daily, increase famotadine to 20 mg twice daily and add carafate to your regimen. If this does not help then will need to refer you back to your gi who did recent endoscopy.  For low level chronic intermittent ha, I do want you to go ahead and get mri as neurologist had ordered. If any severe HA with motor or sensory deficits then advise ED evaluluation.  Will get cmp, cbc, UA, ammonia level and lipase for you back and abdomen pain.  Toradol 30 mg im. This can give relief for mild ha and help with back pain. If this helps with both then might give nsaid rx but would give low dose as nsaids can effect stomach.  For cough will also get xray. Likely reflux related but with xray will evaluate lower lung fields since you report some pain in those areas.  Follow up in 3 weeks or as needed  40 minutes spent with pt. 50% of time spent counseling pt on plan going forward.

## 2019-05-17 ENCOUNTER — Telehealth: Payer: Self-pay | Admitting: Neurology

## 2019-05-17 NOTE — Telephone Encounter (Signed)
Patient is calling in about MRI for brain scan results. Thanks!

## 2019-05-18 NOTE — Telephone Encounter (Signed)
Pt informed of MRI results.

## 2019-05-18 NOTE — Telephone Encounter (Signed)
-----   Message from Cameron Sprang, MD sent at 05/17/2019  4:04 PM EST ----- Regarding: MRI results Pls let patient know that MRI brain did not show any evidence of tumor, stroke, or bleed. It showed age-related changes. Thanks

## 2019-06-24 ENCOUNTER — Other Ambulatory Visit: Payer: Self-pay

## 2019-06-27 ENCOUNTER — Other Ambulatory Visit: Payer: Self-pay

## 2019-06-27 ENCOUNTER — Encounter: Payer: Self-pay | Admitting: Medical

## 2019-06-27 ENCOUNTER — Ambulatory Visit (INDEPENDENT_AMBULATORY_CARE_PROVIDER_SITE_OTHER): Payer: MEDICARE | Admitting: Medical

## 2019-06-27 VITALS — BP 135/83 | HR 65 | Temp 97.0°F | Ht 64.0 in | Wt 202.0 lb

## 2019-06-27 DIAGNOSIS — R0600 Dyspnea, unspecified: Secondary | ICD-10-CM | POA: Diagnosis not present

## 2019-06-27 DIAGNOSIS — M79645 Pain in left finger(s): Secondary | ICD-10-CM

## 2019-06-27 DIAGNOSIS — R600 Localized edema: Secondary | ICD-10-CM | POA: Diagnosis not present

## 2019-06-27 NOTE — Progress Notes (Addendum)
   Subjective:    Patient ID: Brian [name], male    DOB: 1946/03/29, 74 y.o.   MRN: [id number]  HPI  Virtual Visit via Telephone Note  I connected with Minus Liberty on 06/27/19 at  1:20 PM EST by telephone and verified that I am speaking with the correct person using two identifiers.  Location: Patient: home Provider: office   I discussed the limitations, risks, security and privacy concerns of performing an evaluation and management service by telephone and the availability of in person appointments. I also discussed with the patient that there may be a patient responsible charge related to this service. The patient expressed understanding and agreed to proceed.   History of Present Illness: Pt has some pain left side thumb to forearm. This came on after fall. Pt fell going up stairs. He fell forward and grabbed rail to stop stall. Pt states he did not have much pain at that time but one week later when picking thinks up noted pain.   Pt fall was 3 weeks ago but pain present for 2 weeks.   Skin does not look red or swollen.  Pt also states one week ago he is having acute episodes of sob with ambulates. Pt has some legs swelling around his ankles. His feet are swollen. Pt has no cough, no chills, no fever, no diffuse body aches and no change in sense of smell. 7 lb gain since May 2020.   Observations/Objective:  General- no acute distress, pleasant, and oriented. Pt has some pain in thumb on following. Normal speech.    Assessment and Plan: Patient has recent left thumb pain after fall.  On discussion today by telephone suspect that he might have some tenosynovitis.  But do want him to get scheduled for x-ray to rule out any fracture.  He needs to come in tomorrow for his recent 2 weeks of dyspnea and can get x-ray at that time.  Prescribe patient 2 weeks of dyspnea on exertion along with described bilateral lower extremity ankle edema, to get CBC, CMP, BNP and chest x-ray  tomorrow.  In addition for processing we will go ahead and get EKG as well.  Patient scheduled tomorrow at 940.  Advised that if he has any dramatic worsening or changing signs symptoms recommend ED evaluation.  Patient expressed understanding.  Follow-up tomorrow.  Xray of thumb. Thumb spica splint.  Mackie Pai, PA-C  Follow Up Instructions:    I discussed the assessment and treatment plan with the patient. The patient was provided an opportunity to ask questions and all were answered. The patient agreed with the plan and demonstrated an understanding of the instructions.   The patient was advised to call back or seek an in-person evaluation if the symptoms worsen or if the condition fails to improve as anticipated.  I provided 30 minutes of non-face-to-face time during this encounter.   Mackie Pai, PA-C   Review of Systems     Objective:   Physical Exam        Assessment & Plan:

## 2019-06-27 NOTE — Patient Instructions (Addendum)
Patient has recent left thumb pain after fall.  On discussion today by telephone suspect that he might have some tenosynovitis.  But do want him to get scheduled for x-ray to rule out any fracture.  He needs to come in tomorrow for his recent 2 weeks of dyspnea and can get x-ray at that time.  Prescribe patient 2 weeks of dyspnea on exertion along with described bilateral lower extremity ankle edema, to get CBC, CMP, BNP and chest x-ray tomorrow.  In addition for processing we will go ahead and get EKG as well.  Patient scheduled tomorrow at 940.  Advised that if he has any dramatic worsening or changing signs symptoms recommend ED evaluation.  Patient expressed understanding.  Follow-up tomorrow.  Xray of thumb. Thumb spica splint.

## 2019-06-27 NOTE — Addendum Note (Signed)
Addended by: Anabel Halon on: 06/27/2019 04:09 PM   Modules accepted: Orders, Level of Service

## 2019-06-28 ENCOUNTER — Ambulatory Visit (INDEPENDENT_AMBULATORY_CARE_PROVIDER_SITE_OTHER): Payer: MEDICARE | Admitting: Medical

## 2019-06-28 ENCOUNTER — Encounter: Payer: Self-pay | Admitting: Medical

## 2019-06-28 ENCOUNTER — Other Ambulatory Visit: Payer: Self-pay

## 2019-06-28 ENCOUNTER — Ambulatory Visit (HOSPITAL_BASED_OUTPATIENT_CLINIC_OR_DEPARTMENT_OTHER)
Admission: RE | Admit: 2019-06-28 | Discharge: 2019-06-28 | Disposition: A | Discharge status: Home / Self Care | Payer: MEDICARE | Source: Ambulatory Visit | Attending: Medical | Admitting: Medical

## 2019-06-28 ENCOUNTER — Other Ambulatory Visit: Payer: MEDICARE

## 2019-06-28 VITALS — BP 136/80 | HR 67 | Temp 96.9°F | Resp 18 | Ht 64.0 in | Wt 201.4 lb

## 2019-06-28 DIAGNOSIS — R600 Localized edema: Secondary | ICD-10-CM

## 2019-06-28 DIAGNOSIS — R0600 Dyspnea, unspecified: Secondary | ICD-10-CM

## 2019-06-28 DIAGNOSIS — M79645 Pain in left finger(s): Secondary | ICD-10-CM

## 2019-06-28 DIAGNOSIS — R0609 Other forms of dyspnea: Secondary | ICD-10-CM

## 2019-06-28 LAB — CBC WITH DIFFERENTIAL/PLATELET
Basophils Absolute: 0.1 10*3/uL (ref 0.0–0.1)
Basophils Relative: 1.2 % (ref 0.0–3.0)
Eosinophils Absolute: 0.2 10*3/uL (ref 0.0–0.7)
Eosinophils Relative: 3.8 % (ref 0.0–5.0)
HCT: 42.4 % (ref 39.0–52.0)
Hemoglobin: 13.8 g/dL (ref 13.0–17.0)
Lymphocytes Relative: 37.9 % (ref 12.0–46.0)
Lymphs Abs: 1.7 10*3/uL (ref 0.7–4.0)
MCHC: 32.6 g/dL (ref 30.0–36.0)
MCV: 85.9 fl (ref 78.0–100.0)
Monocytes Absolute: 0.6 10*3/uL (ref 0.1–1.0)
Monocytes Relative: 12.8 % — ABNORMAL HIGH (ref 3.0–12.0)
Neutro Abs: 2 10*3/uL (ref 1.4–7.7)
Neutrophils Relative %: 44.3 % (ref 43.0–77.0)
Platelets: 283 10*3/uL (ref 150.0–400.0)
RBC: 4.94 Mil/uL (ref 4.22–5.81)
RDW: 13.3 % (ref 11.5–15.5)
WBC: 4.6 10*3/uL (ref 4.0–10.5)

## 2019-06-28 LAB — COMPREHENSIVE METABOLIC PANEL
ALT: 20 U/L (ref 0–53)
AST: 20 U/L (ref 0–37)
Albumin: 3.9 g/dL (ref 3.5–5.2)
Alkaline Phosphatase: 70 U/L (ref 39–117)
BUN: 11 mg/dL (ref 6–23)
CO2: 32 mEq/L (ref 19–32)
Calcium: 9.3 mg/dL (ref 8.4–10.5)
Chloride: 103 mEq/L (ref 96–112)
Creatinine, Ser: 1.09 mg/dL (ref 0.40–1.50)
GFR: 79.67 mL/min (ref >60.00)
Glucose, Bld: 98 mg/dL (ref 70–99)
Potassium: 3.9 mEq/L (ref 3.5–5.1)
Sodium: 138 mEq/L (ref 135–145)
Total Bilirubin: 1.5 mg/dL — ABNORMAL HIGH (ref 0.2–1.2)
Total Protein: 6.2 g/dL (ref 6.0–8.3)

## 2019-06-28 LAB — BRAIN NATRIURETIC PEPTIDE: Brain Natriuretic Peptide: 16 pg/mL (ref <100)

## 2019-06-28 MED ORDER — SERTRALINE HCL 25 MG PO TABS: 25.0000 mg | ORAL_TABLET | Freq: Every day | ORAL | 0 refills | Status: DC

## 2019-06-28 MED ORDER — PREDNISONE 10 MG PO TABS: ORAL_TABLET | ORAL | 0 refills | Status: DC

## 2019-06-28 MED FILL — predniSONE 10 MG TABS: 10 | 6 days supply | Qty: 21 | Fill #0

## 2019-06-28 NOTE — Progress Notes (Signed)
   Subjective:    Patient ID: Brian [name], male    DOB: 1946/04/27, 74 y.o.   MRN: [id number]  HPI  Pt seen yesterday by virtual telephone. He states same signs/symptoms as before.  No changes.  Pt has some pain left side thumb to forearm. This came on after fall. Pt fell going up stairs. He fell forward and grabbed rail to stop stall. Pt states he did not have much pain at that time but one week later when picking thinks up noted pain.  Pt fall was 3 weeks ago but pain present for 2 weeks.   Skin does not look red or swollen.  Pt also states one week ago he is having acute episodes of sob with ambulates. Pt has some legs swelling around his ankles. His feet are swollen. Pt has no cough, no chills, no fever, no diffuse body aches and no change in sense of smell. 7 lb gain since May 2020.  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 Pt states inhalers he used in the past never helped. Pt does not recently with sob he can hear himself wheeze.  Pt in September had depression. He was on sertraline 25 mg daily. He does not remember if it helped. 13 score on phq-9  Review of Systems  Respiratory: Positive for shortness of breath. Negative for cough, chest tightness and wheezing.        See hpi.  Cardiovascular: Negative for chest pain and palpitations.  Musculoskeletal:       Thumb pain  Hematological: Negative for adenopathy. Does not bruise/bleed easily.  Psychiatric/Behavioral: Negative for behavioral problems and confusion.       Objective:   Physical Exam  General Mental Status- Alert. General Appearance- Not in acute distress.   Skin General: Color- Normal Color. Moisture- Normal Moisture.  Neck Carotid Arteries- Normal color. Moisture- Normal Moisture. No carotid bruits. No JVD.  Chest and Lung Exam Auscultation: Breath Sounds:-Normal.  Cardiovascular Auscultation:Rythm- Regular. Murmurs & Other Heart Sounds:Auscultation of the heart reveals- No  Murmurs.  Abdomen Inspection:-Inspeection Normal. Palpation/Percussion:Note:No mass. Palpation and Percussion of the abdomen reveal- Non Tender, Non Distended + BS, no rebound or guarding.    Neurologic Cranial Nerve exam:- CN III-XII intact(No nystagmus), symmetric smile. Strength:- 5/5 equal and symmetric strength both upper and lower extremities.  Lower ext- faint 1+ ankle edema. Negative homans signs.  Left upper ext- + finklestein test.      Assessment & Plan:  For your dyspnea and recent wheezing with hx of Insterstitial lung disease will rx taper prednisone since you report inhalers have not helped in the past. But start when we give you ok on lab/cxr review.  Your ekg shows sinus bradycardia. No ischemic changes. No chest pain. Ekg filled with artifact due to neurostimulator v3 not seen well at all.  For depression restart sertraline. May increase next month.  Follow up one month or as needed   30+ time spent with pt.

## 2019-06-28 NOTE — Patient Instructions (Signed)
For your dyspnea and recent wheezing with hx of Insterstitial lung disease will rx taper prednisone since you report inhalers have not helped in the past. But start when we give you ok on lab/cxr review.  Your ekg shows sinus bradycardia. No ischemic changes. No chest pain. Ekg filled with artifact due to neurostimulator v3 not seen well at all.  For depression restart sertraline. May increase next month.  Follow up one month or as needed

## 2019-07-04 ENCOUNTER — Encounter: Payer: Self-pay | Admitting: Gastroenterology

## 2019-07-09 ENCOUNTER — Other Ambulatory Visit: Payer: Self-pay | Admitting: Medical

## 2019-07-25 ENCOUNTER — Telehealth: Payer: Self-pay | Admitting: Medical

## 2019-07-25 NOTE — Telephone Encounter (Signed)
I have never heard of any contraindication with covid vaccine and stimulator. Allergic reaction to components of vaccine are the biggest concern. But if he is concerned with that he could ask he pharmacist if any component similarity of gabapentin and covid vaccine. I would not think so but can ask his pharmacist.  Gabapentin is only thing he had atypical reaction to in the past.

## 2019-07-25 NOTE — Telephone Encounter (Signed)
Caller Name: Brian [name], pt Phone: [phone]  Pt asking if ok to get the COVID-19 vaccine considering he has a spinal cord stimulator implant. Please advise.

## 2019-07-26 NOTE — Telephone Encounter (Signed)
Patient notified

## 2019-08-03 ENCOUNTER — Other Ambulatory Visit: Payer: Self-pay

## 2019-08-04 ENCOUNTER — Other Ambulatory Visit: Payer: Self-pay

## 2019-08-05 ENCOUNTER — Ambulatory Visit (INDEPENDENT_AMBULATORY_CARE_PROVIDER_SITE_OTHER): Payer: MEDICARE | Admitting: Medical

## 2019-08-05 ENCOUNTER — Ambulatory Visit (HOSPITAL_BASED_OUTPATIENT_CLINIC_OR_DEPARTMENT_OTHER)
Admission: RE | Admit: 2019-08-05 | Discharge: 2019-08-05 | Disposition: A | Discharge status: Home / Self Care | Payer: MEDICARE | Source: Ambulatory Visit | Attending: Medical | Admitting: Medical

## 2019-08-05 ENCOUNTER — Other Ambulatory Visit: Payer: Self-pay

## 2019-08-05 VITALS — BP 136/67 | HR 51 | Temp 96.0°F | Resp 16 | Ht 64.0 in | Wt 201.6 lb

## 2019-08-05 DIAGNOSIS — N50811 Right testicular pain: Secondary | ICD-10-CM | POA: Diagnosis present

## 2019-08-05 DIAGNOSIS — N451 Epididymitis: Secondary | ICD-10-CM | POA: Diagnosis not present

## 2019-08-05 DIAGNOSIS — R300 Dysuria: Secondary | ICD-10-CM

## 2019-08-05 DIAGNOSIS — R350 Frequency of micturition: Secondary | ICD-10-CM | POA: Diagnosis not present

## 2019-08-05 DIAGNOSIS — M549 Dorsalgia, unspecified: Secondary | ICD-10-CM | POA: Diagnosis not present

## 2019-08-05 DIAGNOSIS — R109 Unspecified abdominal pain: Secondary | ICD-10-CM

## 2019-08-05 LAB — COMPREHENSIVE METABOLIC PANEL
ALT: 20 U/L (ref 0–53)
AST: 18 U/L (ref 0–37)
Albumin: 3.8 g/dL (ref 3.5–5.2)
Alkaline Phosphatase: 65 U/L (ref 39–117)
BUN: 9 mg/dL (ref 6–23)
CO2: 29 mEq/L (ref 19–32)
Calcium: 9.3 mg/dL (ref 8.4–10.5)
Chloride: 104 mEq/L (ref 96–112)
Creatinine, Ser: 1.05 mg/dL (ref 0.40–1.50)
GFR: 83.16 mL/min (ref >60.00)
Glucose, Bld: 94 mg/dL (ref 70–99)
Potassium: 4.3 mEq/L (ref 3.5–5.1)
Sodium: 139 mEq/L (ref 135–145)
Total Bilirubin: 1.3 mg/dL — ABNORMAL HIGH (ref 0.2–1.2)
Total Protein: 6.4 g/dL (ref 6.0–8.3)

## 2019-08-05 LAB — CBC WITH DIFFERENTIAL/PLATELET
Basophils Absolute: 0 10*3/uL (ref 0.0–0.1)
Basophils Relative: 0.8 % (ref 0.0–3.0)
Eosinophils Absolute: 0.1 10*3/uL (ref 0.0–0.7)
Eosinophils Relative: 3.2 % (ref 0.0–5.0)
HCT: 42.6 % (ref 39.0–52.0)
Hemoglobin: 13.8 g/dL (ref 13.0–17.0)
Lymphocytes Relative: 41.4 % (ref 12.0–46.0)
Lymphs Abs: 1.7 10*3/uL (ref 0.7–4.0)
MCHC: 32.3 g/dL (ref 30.0–36.0)
MCV: 86.2 fl (ref 78.0–100.0)
Monocytes Absolute: 0.6 10*3/uL (ref 0.1–1.0)
Monocytes Relative: 14.7 % — ABNORMAL HIGH (ref 3.0–12.0)
Neutro Abs: 1.7 10*3/uL (ref 1.4–7.7)
Neutrophils Relative %: 39.9 % — ABNORMAL LOW (ref 43.0–77.0)
Platelets: 293 10*3/uL (ref 150.0–400.0)
RBC: 4.94 Mil/uL (ref 4.22–5.81)
RDW: 13.4 % (ref 11.5–15.5)
WBC: 4.2 10*3/uL (ref 4.0–10.5)

## 2019-08-05 LAB — POC URINALSYSI DIPSTICK (AUTOMATED)
Bilirubin, UA: NEGATIVE
Blood, UA: NEGATIVE
Glucose, UA: NEGATIVE
Ketones, UA: NEGATIVE
Leukocytes, UA: NEGATIVE
Nitrite, UA: NEGATIVE
Protein, UA: NEGATIVE
Spec Grav, UA: 1.015 (ref 1.010–1.025)
Urobilinogen, UA: 0.2 E.U./dL
pH, UA: 5.5 (ref 5.0–8.0)

## 2019-08-05 LAB — PSA: PSA: 0.6 ng/mL (ref 0.10–4.00)

## 2019-08-05 MED ORDER — CIPROFLOXACIN HCL 500 MG PO TABS: 500.0000 mg | ORAL_TABLET | Freq: Two times a day (BID) | ORAL | 0 refills | Status: DC

## 2019-08-05 MED FILL — CIPROFLOXACIN HCL 500 MG TA: 500 | 7 days supply | Qty: 14 | Fill #0

## 2019-08-05 NOTE — Patient Instructions (Signed)
For your recent right CVA area pain, dysuria, minimal suprapubic pressure, frequent urination and slight testicle pain(possible epididymitis), I put in order for the below listed labs.  Your urine looked clear in office today but will get urine culture.  Also will get a scrotum ultrasound.  Will prescribe Cipro antibiotic for possible prostatitis, epididymitis and discovers urinary tract tract infection as well.  If your signs symptoms change or worsen let us know.  Any severe signs symptoms then recommend ED evaluation.  Follow-up in 7 to 10 days or as needed.

## 2019-08-05 NOTE — Progress Notes (Signed)
Subjective:    Patient ID: Brian [name], male    DOB: 12/15/1945, 74 y.o.   MRN: [id number]  HPI  Pt in with some rt side cva pain. Sharp pain and transient. Pain for 2 weeks. He states some frequent urination as well. No rash on his skin. No dysuria during flow of urine but some afterwards. Some faint rt testicle pain. Slight mild constant rt testicle tenderness.    Review of Systems  Constitutional: Negative for chills, fatigue and fever.  Respiratory: Negative for cough, chest tightness, shortness of breath and wheezing.   Cardiovascular: Negative for chest pain and palpitations.  Gastrointestinal: Negative for abdominal distention, abdominal pain, constipation and diarrhea.  Genitourinary: Positive for testicular pain.  Musculoskeletal: Positive for back pain. Negative for myalgias, neck pain and neck stiffness.  Skin: Negative for rash.  Hematological: Negative for adenopathy. Does not bruise/bleed easily.  Psychiatric/Behavioral: Negative for behavioral problems and confusion.    Past Medical History:  Diagnosis Date  . Atypical chest pain     Negative cardiac catheterization 2014  . Barrett's esophagus   . 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 Blind right eye   . 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 Chicken pox   . Chronic back pain   . Dislocation, jaw 1960s  . Edema    Left Leg  . Essential hypertension, benign   . GERD (gastroesophageal reflux disease)   . [place] measles   . Heart attack ([place])   . Hyperlipidemia   . Measles   . Mumps   . Osteopenia 11.12.10   bone density test  . Personal history of other diseases of digestive system 08.10.12   Gastric ulcer     Social History   Socioeconomic History  . Marital status: Single    Spouse name: Not on file  . Number of children: 0  . Years of education: Not on file  . Highest education level: Not on file  Occupational History  . Not on file  Tobacco Use  . Smoking status: Former  Smoker    Packs/day: 0.25    Years: 2.00    Pack years: 0.50    Types: Cigarettes    Quit date: 06/02/1966    Years since quitting: 53.2  . Smokeless tobacco: Never Used  Substance and Sexual Activity  . Alcohol use: No    Alcohol/week: 0.0 standard drinks  . Drug use: No  . Sexual activity: Never  Other Topics Concern  . Not on file  Social History Narrative  . Not on file   Social Determinants of Health   Financial Resource Strain:   . Difficulty of Paying Living Expenses: Not on file  Food Insecurity:   . Worried About Charity fundraiser in the Last Year: Not on file  . Ran Out of Food in the Last Year: Not on file  Transportation Needs:   . Lack of Transportation (Medical): Not on file  . Lack of Transportation (Non-Medical): Not on file  Physical Activity:   . Days of Exercise per Week: Not on file  . Minutes of Exercise per Session: Not on file  Stress:   . Feeling of Stress : Not on file  Social Connections:   . Frequency of Communication with Friends and Family: Not on file  . Frequency of Social Gatherings with Friends and Family: Not on file  . Attends Religious Services: Not on file  . Active Member of Clubs or Organizations: Not on file  .  Attends Archivist Meetings: Not on file  . Marital Status: Not on file  Intimate Partner Violence:   . Fear of Current or Ex-Partner: Not on file  . Emotionally Abused: Not on file  . Physically Abused: Not on file  . Sexually Abused: Not on file    Past Surgical History:  Procedure Laterality Date  . ABDOMINAL HERNIA REPAIR  1972  . APPENDECTOMY  1973  . BACK SURGERY  10/28/2017  . CATARACT EXTRACTION, BILATERAL  2005  . CHOLECYSTECTOMY  08/2016  . COLONOSCOPY WITH ESOPHAGOGASTRODUODENOSCOPY (EGD)     about 2016. [facility]  . ESOPHAGOGASTRODUODENOSCOPY  06.2011  . EYE SURGERY  1972   Bilateral Lens Replacement  . FACIAL RECONSTRUCTION SURGERY  2008  . LEFT HEART CATH  02.17.2014  . [place], 2008   Dislocated Jaw  . PARS PLANA VITRECTOMY W/ REPAIR OF MACULAR HOLE  2005   macular hole repair, right eye  . PARS PLANA VITRECTOMY W/ REPAIR OF MACULAR HOLE  2010   mechanical, left eye  . PROSTATE BIOPSY  [place] 2017   w/ Dr. [place] Crane  . WISDOM TOOTH EXTRACTION      Family History  Problem Relation Age of Onset  . Alzheimer's disease Mother        Deceased  . Diabetes Maternal Grandmother   . Heart attack Maternal Grandmother   . Prostate cancer Maternal Uncle   . Diabetes Maternal Uncle   . Healthy Son   . Cataracts Maternal Aunt   . Colon cancer Neg Hx   . Esophageal cancer Neg Hx     Allergies  Allergen Reactions  . Gabapentin Other (See Comments)    Feel "weird"; Nausea; Weakness; Syncope    Current Outpatient Medications on File Prior to Visit  Medication Sig Dispense Refill  . aspirin (ASPIR-81) 81 MG EC tablet Take 1 tablet by mouth daily.    Marland Kitchen b complex vitamins tablet Take 1 tablet by mouth daily.    . Calcium Carbonate-Vitamin D (CALCIUM-VITAMIN D3 PO) Take by mouth daily.    . cyclobenzaprine (FLEXERIL) 5 MG tablet 1 tab po q hs if needed neck pain/tension ha 10 tablet 0  . famotidine (PEPCID) 20 MG tablet Take 1 tablet (20 mg total) by mouth at bedtime. 30 tablet 3  . hydrochlorothiazide (MICROZIDE) 12.5 MG capsule TAKE 1 CAPSULE EVERY DAY 90 capsule 1  . meclizine (ANTIVERT) 12.5 MG tablet Take 1 tablet (12.5 mg total) by mouth 3 (three) times daily as needed for dizziness. 21 tablet 0  . pantoprazole (PROTONIX) 40 MG tablet Take 1 tablet (40 mg total) by mouth 2 (two) times daily. 180 tablet 3  . predniSONE (DELTASONE) 10 MG tablet 6 TAB PO DAY 1 5 TAB PO DAY 2 4 TAB PO DAY 3 3 TAB PO DAY 4 2 TAB PO DAY 5 1 TAB PO DAY 6 21 tablet 0  . rosuvastatin (CRESTOR) 20 MG tablet Take 1 tablet (20 mg total) by mouth daily. 90 tablet 3  . sertraline (ZOLOFT) 25 MG tablet Take 1 tablet (25 mg total) by mouth daily. 30 tablet 3  . sertraline (ZOLOFT)  25 MG tablet Take 1 tablet (25 mg total) by mouth daily. 30 tablet 0  . sucralfate (CARAFATE) 1 g tablet Take 1 tablet (1 g total) by mouth 4 (four) times daily -  with meals and at bedtime. 120 tablet 3  . tamsulosin (FLOMAX) 0.4 MG CAPS capsule Take 2 capsules (  0.8 mg total) by mouth daily. 90 capsule 1  . timolol (TIMOPTIC) 0.5 % ophthalmic solution Place 1 drop into the left eye daily. 10 mL 12  . traMADol (ULTRAM) 50 MG tablet 1 tab po q 8 hours prn pain 15 tablet 0   No current facility-administered medications on file prior to visit.    BP 136/67 (BP Location: Left Arm, Patient Position: Sitting, Cuff Size: Large)   Pulse (!) 51   Temp (!) 96 F (35.6 C) (Temporal)   Resp 16   Ht 5\' 4"  (1.626 m)   Wt 201 lb 9.6 oz (91.4 kg)   SpO2 99%   BMI 34.60 kg/m       Objective:   Physical Exam  General- No acute distress. Pleasant patient. Neck- Full range of motion, no jvd Lungs- Clear, even and unlabored. Heart- regular rate and rhythm. Neurologic- CNII- XII grossly intact. Back-rt cva tenderness. Skin- no rash on back. abd pain- faint rt side abdomen pain. No rebound or guarding. +bs, soft, nt, nd. Genital exam- rt testicle faint tender in epididymis area/ no hernia on exam. Left side testicle is normal.     Assessment & Plan:  For your recent right CVA area pain, dysuria, minimal suprapubic pressure, frequent urination and slight testicle pain(possible epididymitis), I put in order for the below listed labs.  Your urine looked clear in office today but will get urine culture.  Also will get a scrotum ultrasound.  Will prescribe Cipro antibiotic for possible prostatitis, epididymitis and discovers urinary tract tract infection as well.  If your signs symptoms change or worsen let us know.  Any severe signs symptoms then recommend ED evaluation.  Follow-up in 7 to 10 days or as needed.  35 minutes spent with pt.

## 2019-08-06 LAB — URINE CULTURE
MICRO NUMBER:: 10219625
Result:: NO GROWTH
SPECIMEN QUALITY:: ADEQUATE

## 2019-09-14 ENCOUNTER — Emergency Department (HOSPITAL_BASED_OUTPATIENT_CLINIC_OR_DEPARTMENT_OTHER)
Admission: EM | Admit: 2019-09-14 | Discharge: 2019-09-14 | Disposition: A | Discharge status: Home / Self Care | Payer: MEDICARE | Attending: Emergency Medicine | Admitting: Emergency Medicine

## 2019-09-14 ENCOUNTER — Encounter (HOSPITAL_BASED_OUTPATIENT_CLINIC_OR_DEPARTMENT_OTHER): Payer: Self-pay

## 2019-09-14 ENCOUNTER — Emergency Department (HOSPITAL_BASED_OUTPATIENT_CLINIC_OR_DEPARTMENT_OTHER): Payer: MEDICARE

## 2019-09-14 ENCOUNTER — Other Ambulatory Visit: Payer: Self-pay

## 2019-09-14 DIAGNOSIS — Z7982 Long term (current) use of aspirin: Secondary | ICD-10-CM | POA: Insufficient documentation

## 2019-09-14 DIAGNOSIS — R0602 Shortness of breath: Secondary | ICD-10-CM | POA: Diagnosis not present

## 2019-09-14 DIAGNOSIS — I10 Essential (primary) hypertension: Secondary | ICD-10-CM | POA: Insufficient documentation

## 2019-09-14 DIAGNOSIS — I252 Old myocardial infarction: Secondary | ICD-10-CM | POA: Diagnosis not present

## 2019-09-14 DIAGNOSIS — Z886 Allergy status to analgesic agent status: Secondary | ICD-10-CM | POA: Diagnosis not present

## 2019-09-14 DIAGNOSIS — R05 Cough: Secondary | ICD-10-CM | POA: Diagnosis not present

## 2019-09-14 DIAGNOSIS — Z87891 Personal history of nicotine dependence: Secondary | ICD-10-CM | POA: Insufficient documentation

## 2019-09-14 DIAGNOSIS — E785 Hyperlipidemia, unspecified: Secondary | ICD-10-CM | POA: Insufficient documentation

## 2019-09-14 DIAGNOSIS — T50Z95A Adverse effect of other vaccines and biological substances, initial encounter: Secondary | ICD-10-CM | POA: Insufficient documentation

## 2019-09-14 DIAGNOSIS — Z79899 Other long term (current) drug therapy: Secondary | ICD-10-CM | POA: Insufficient documentation

## 2019-09-14 MED ORDER — BENZONATATE 100 MG PO CAPS: 100.0000 mg | ORAL_CAPSULE | Freq: Three times a day (TID) | ORAL | 0 refills | Status: DC

## 2019-09-14 MED FILL — BENZONATATE 100 MG CAPS: 100 | 7 days supply | Qty: 21 | Fill #0

## 2019-09-14 NOTE — ED Triage Notes (Signed)
Pt c/o cough/flu like sx started this am-states he received covid vaccine #2 yesterday-NAD-steady gait

## 2019-09-14 NOTE — ED Provider Notes (Signed)
[facility] EMERGENCY DEPARTMENT Provider Note   CSN: [id number] Arrival date & time: 09/14/19  1654     History Chief Complaint  Patient presents with  . Cough    Brian [name] is a 74 y.o. male.  74 yo M with a cc of cough congestion body aches going on for about 48 hours.  This started the day after he had his second coronavirus vaccination.  Denies nausea vomiting or diarrhea denies abdominal pain denies chest pain.  Has felt mildly short of breath with this.  Called his family doctor who told him this was likely to be expected.  Came to the ED for evaluation.  The history is provided by the patient.  Cough Associated symptoms: shortness of breath   Associated symptoms: no chest pain, no chills, no eye discharge, no fever, no headaches, no myalgias and no rash   Illness Severity:  Moderate Onset quality:  Gradual Duration:  2 days Timing:  Constant Progression:  Worsening Chronicity:  New Associated symptoms: cough and shortness of breath   Associated symptoms: no abdominal pain, no chest pain, no congestion, no diarrhea, no fever, no headaches, no myalgias, no rash and no vomiting        Past Medical History:  Diagnosis Date  . Atypical chest pain     Negative cardiac catheterization 2014  . Barrett's esophagus   . 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 Blind right eye   . 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 Chicken pox   . Chronic back pain   . Dislocation, jaw 1960s  . Edema    Left Leg  . Essential hypertension, benign   . GERD (gastroesophageal reflux disease)   . [place] measles   . Heart attack ([place])   . Hyperlipidemia   . Measles   . Mumps   . Osteopenia 11.12.10   bone density test  . Personal history of other diseases of digestive system 08.10.12   Gastric ulcer    Patient Active Problem List   Diagnosis Date Noted  . Right shoulder pain 01/30/2017  . Left shoulder pain 05/19/2016  . Urinary hesitancy 10/18/2014   . Trapezius muscle spasm 10/18/2014  . Idiopathic neuropathy 10/18/2014  . Allergic reaction 09/28/2014  . Hyperglycemia 09/28/2014  . Acute bacterial bronchitis 08/18/2014  . Hematuria 08/18/2014  . Screening, ischemic heart disease 06/16/2014  . Lumbar radiculopathy, chronic 05/19/2014  . Midline thoracic back pain 03/24/2014  . BPPV (benign paroxysmal positional vertigo) 01/20/2014  . Right-sided low back pain with right-sided sciatica 12/21/2013  . Lightheadedness 09/22/2013  . Porokeratosis 02/25/2013  . Pain in joint, ankle and foot 02/25/2013  . Lumbosacral pain 02/15/2013  . Abdominal pain, epigastric 02/15/2013  . Acid reflux 02/15/2013  . Hearing loss 02/15/2013  . Preventive measure 02/15/2013  . Chest pain 07/19/2012  . Bradycardia, sinus 07/19/2012  . Borderline glaucoma 03/15/2012  . 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 Dislocated inferior maxilla 01/10/2011  . H/O gastric ulcer 01/10/2011  . Back pain, chronic 11/21/2010    Past Surgical History:  Procedure Laterality Date  . ABDOMINAL HERNIA REPAIR  1972  . APPENDECTOMY  1973  . BACK SURGERY  10/28/2017  . CATARACT EXTRACTION, BILATERAL  2005  . CHOLECYSTECTOMY  08/2016  . COLONOSCOPY WITH ESOPHAGOGASTRODUODENOSCOPY (EGD)     about 2016. [place] [facility]  . ESOPHAGOGASTRODUODENOSCOPY  06.2011  . EYE SURGERY  1972   Bilateral Lens Replacement  . FACIAL  RECONSTRUCTION SURGERY  2008  . LEFT HEART CATH  02.17.2014  . [place], 2008   Dislocated Jaw  . PARS PLANA VITRECTOMY W/ REPAIR OF MACULAR HOLE  2005   macular hole repair, right eye  . PARS PLANA VITRECTOMY W/ REPAIR OF MACULAR HOLE  2010   mechanical, left eye  . PROSTATE BIOPSY  [place] 2017   w/ Dr. [place] Crane  . SPINAL CORD STIMULATOR IMPLANT    . WISDOM TOOTH EXTRACTION         Family History  Problem Relation Age of Onset  . Alzheimer's disease Mother        Deceased  . Diabetes Maternal  Grandmother   . Heart attack Maternal Grandmother   . Prostate cancer Maternal Uncle   . Diabetes Maternal Uncle   . Healthy Son   . Cataracts Maternal Aunt   . Colon cancer Neg Hx   . Esophageal cancer Neg Hx     Social History   Tobacco Use  . Smoking status: Former Smoker    Packs/day: 0.25    Years: 2.00    Pack years: 0.50    Types: Cigarettes    Quit date: 06/02/1966    Years since quitting: 53.3  . Smokeless tobacco: Never Used  Substance Use Topics  . Alcohol use: No    Alcohol/week: 0.0 standard drinks  . Drug use: No    Home Medications Prior to Admission medications   Medication Sig Start Date End Date Taking? Authorizing Provider  aspirin (ASPIR-81) 81 MG EC tablet Take 1 tablet by mouth daily.    [provider]  b complex vitamins tablet Take 1 tablet by mouth daily.    [provider]  benzonatate (TESSALON) 100 MG capsule Take 1 capsule (100 mg total) by mouth every 8 (eight) hours. 09/14/19   Deno Etienne, DO  Calcium Carbonate-Vitamin D (CALCIUM-VITAMIN D3 PO) Take by mouth daily.    [provider]  ciprofloxacin (CIPRO) 500 MG tablet Take 1 tablet (500 mg total) by mouth 2 (two) times daily. 08/05/19   Saguier, Percell Miller, PA-C  cyclobenzaprine (FLEXERIL) 5 MG tablet 1 tab po q hs if needed neck pain/tension ha 02/02/19   Saguier, Percell Miller, PA-C  famotidine (PEPCID) 20 MG tablet Take 1 tablet (20 mg total) by mouth at bedtime. 02/02/19   Saguier, Percell Miller, PA-C  hydrochlorothiazide (MICROZIDE) 12.5 MG capsule TAKE 1 CAPSULE EVERY DAY 07/11/19   Saguier, Percell Miller, PA-C  meclizine (ANTIVERT) 12.5 MG tablet Take 1 tablet (12.5 mg total) by mouth 3 (three) times daily as needed for dizziness. 02/02/19   Saguier, Percell Miller, PA-C  pantoprazole (PROTONIX) 40 MG tablet Take 1 tablet (40 mg total) by mouth 2 (two) times daily. 09/21/18   Saguier, Percell Miller, PA-C  predniSONE (DELTASONE) 10 MG tablet 6 TAB PO DAY 1 5 TAB PO DAY 2 4 TAB PO DAY 3 3 TAB PO DAY 4 2 TAB PO  DAY 5 1 TAB PO DAY 6 06/28/19   Saguier, Percell Miller, PA-C  rosuvastatin (CRESTOR) 20 MG tablet Take 1 tablet (20 mg total) by mouth daily. 12/14/18   Saguier, Percell Miller, PA-C  sertraline (ZOLOFT) 25 MG tablet Take 1 tablet (25 mg total) by mouth daily. 02/02/19   Saguier, Percell Miller, PA-C  sertraline (ZOLOFT) 25 MG tablet Take 1 tablet (25 mg total) by mouth daily. 06/28/19   Saguier, Percell Miller, PA-C  sucralfate (CARAFATE) 1 g tablet Take 1 tablet (1 g total) by mouth 4 (four) times daily -  with meals and at bedtime. 04/20/19   Saguier, Percell Miller, PA-C  tamsulosin (FLOMAX) 0.4 MG CAPS capsule Take 2 capsules (0.8 mg total) by mouth daily. 11/13/16   Shelda Pal, DO  timolol (TIMOPTIC) 0.5 % ophthalmic solution Place 1 drop into the left eye daily. 10/03/16   Saguier, Percell Miller, PA-C  traMADol Veatrice Bourbon) 50 MG tablet 1 tab po q 8 hours prn pain 12/08/18   Saguier, Percell Miller, PA-C    Allergies    Gabapentin  Review of Systems   Review of Systems  Constitutional: Negative for chills and fever.  HENT: Negative for congestion and facial swelling.   Eyes: Negative for discharge and visual disturbance.  Respiratory: Positive for cough and shortness of breath.   Cardiovascular: Negative for chest pain and palpitations.  Gastrointestinal: Negative for abdominal pain, diarrhea and vomiting.  Musculoskeletal: Negative for arthralgias and myalgias.  Skin: Negative for color change and rash.  Neurological: Negative for tremors, syncope and headaches.  Psychiatric/Behavioral: Negative for confusion and dysphoric mood.    Physical Exam Updated Vital Signs BP (!) 146/80 (BP Location: Left Arm)   Pulse 81   Temp 99.9 F (37.7 C) (Oral)   Resp 16   Ht 5\' 4"  (1.626 m)   Wt 91.5 kg   SpO2 98%   BMI 34.64 kg/m   Physical Exam Vitals and nursing note reviewed.  Constitutional:      Appearance: He is well-developed.  HENT:     Head: Normocephalic and atraumatic.     Comments: Swollen turbinates, posterior nasal  drip, no noted sinus ttp, chronic scarring seen to TMs bilaterally Eyes:     Pupils: Pupils are equal, round, and reactive to light.  Neck:     Vascular: No JVD.  Cardiovascular:     Rate and Rhythm: Normal rate and regular rhythm.     Heart sounds: No murmur. No friction rub. No gallop.   Pulmonary:     Effort: No respiratory distress.     Breath sounds: No wheezing.  Abdominal:     General: There is no distension.     Tenderness: There is no abdominal tenderness. There is no guarding or rebound.  Musculoskeletal:        General: Normal range of motion.     Cervical back: Normal range of motion and neck supple.  Skin:    Coloration: Skin is not pale.     Findings: No rash.  Neurological:     Mental Status: He is alert and oriented to person, place, and time.  Psychiatric:        Behavior: Behavior normal.     ED Results / Procedures / Treatments   Labs (all labs ordered are listed, but only abnormal results are displayed) Labs Reviewed - No data to display  EKG None  Radiology DG [facility] 1 View  Result Date: 09/14/2019 CLINICAL DATA:  Shortness of breath, cough and flu like symptoms which began this morning, second dose of COVID-19 vaccine yesterday EXAM: PORTABLE CHEST 1 VIEW COMPARISON:  Radiograph 06/28/2019 FINDINGS: Low lung volumes with some streaky atelectatic changes in the bases. Increased basilar opacity likely due in part to body habitus. No consolidative opacity or convincing features of edema. No pneumothorax or visible effusion. No acute osseous or soft tissue abnormality. Degenerative changes are present in the imaged spine and shoulders. Dextrocurvature of the midthoracic spine is similar to prior. Thoracic spinal leads are unchanged from prior exam. IMPRESSION: Low lung volumes with streaky bibasilar atelectasis. No  other acute cardiopulmonary abnormality. Electronically Signed   By: Lovena Le M.D.   On: 09/14/2019 17:46    Procedures Procedures  (including critical care time)  Medications Ordered in ED Medications - No data to display  ED Course  I have reviewed the triage vital signs and the nursing notes.  Pertinent labs & imaging results that were available during my care of the patient were reviewed by me and considered in my medical decision making (see chart for details).    MDM Rules/Calculators/A&P                      74  Yo M with a cc of cough and fever post covid vaccine.  Well appearing and non toxic, clear lungs.  Afebrile here, will obtain cxr.  CXR without focal infiltrate as viewed by me or pneumothorax.  Discharge home.  6:56 PM:  I have discussed the diagnosis/risks/treatment options with the patient and believe the pt to be eligible for discharge home to follow-up with PCP. We also discussed returning to the ED immediately if new or worsening sx occur. We discussed the sx which are most concerning (e.g., sudden worsening pain, fever, inability to tolerate by mouth) that necessitate immediate return. Medications administered to the patient during their visit and any new prescriptions provided to the patient are listed below.  Medications given during this visit Medications - No data to display   The patient appears reasonably screen and/or stabilized for discharge and I doubt any other medical condition or other [facility] requiring further screening, evaluation, or treatment in the ED at this time prior to discharge.   Final Clinical Impression(s) / ED Diagnoses Final diagnoses:  Vaccine reaction, initial encounter    Rx / DC Orders ED Discharge Orders         Ordered    benzonatate (TESSALON) 100 MG capsule  Every 8 hours     09/14/19 1752           Deno Etienne, DO 09/14/19 1856

## 2019-09-14 NOTE — Discharge Instructions (Signed)
Return for worsening shortness of breath inability to eat or drink.  Please follow-up with your family doctor.  Your chest x-ray was normal.

## 2019-09-22 NOTE — Progress Notes (Addendum)
Subjective:   Brian [name] is a 74 y.o. male who presents for Medicare Annual/Subsequent preventive examination.  Listens to mystery books.   Review of Systems:  Home Safety/Smoke Alarms: Feels safe in home. Smoke alarms in place.  Lives in 2 story home w/ cousin.  Male:   CCS- pt reports he will discuss w/ PCP at next visit.      PSA-  Lab Results  Component Value Date   PSA 0.60 08/05/2019   PSA 0.87 12/09/2017   PSA 1.22 06/09/2017       Objective:    Vitals: BP 132/80 (BP Location: Left Arm, Patient Position: Sitting, Cuff Size: Normal)   Pulse 61   Temp (!) 97 F (36.1 C) (Temporal)   Ht 5\' 4"  (1.626 m)   Wt 199 lb 12.8 oz (90.6 kg)   SpO2 98%   BMI 34.30 kg/m   Body mass index is 34.3 kg/m.  Advanced Directives 09/23/2019 09/14/2019 09/23/2018 03/24/2018 03/01/2018 02/22/2018 12/08/2017  Does Patient Have a Medical Advance Directive? No No No No No No No  Would patient like information on creating a medical advance directive? No - Patient declined - No - Patient declined No - Patient declined No - Patient declined Yes (MAU/Ambulatory/Procedural Areas - Information given) No - Patient declined    Tobacco Social History   Tobacco Use  Smoking Status Former Smoker   Packs/day: 0.25   Years: 2.00   Pack years: 0.50   Types: Cigarettes   Quit date: 06/02/1966   Years since quitting: 53.3  Smokeless Tobacco Never Used     Counseling given: Not Answered   Clinical Intake:     Pain : No/denies pain     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 [place] [facility]   ESOPHAGOGASTRODUODENOSCOPY  06.2011   EYE SURGERY  1972   Bilateral Lens Replacement   FACIAL RECONSTRUCTION SURGERY  2008   LEFT HEART CATH  02.17.2014   MANDIBLE SURGERY  1968, 2008   Dislocated Jaw   PARS PLANA VITRECTOMY W/ REPAIR OF MACULAR HOLE  2005   macular hole repair, right eye   PARS PLANA VITRECTOMY W/ REPAIR OF MACULAR HOLE  2010   mechanical, left eye   PROSTATE BIOPSY  [place] 2017   w/ Dr. [place] Crane   SPINAL CORD STIMULATOR IMPLANT     WISDOM TOOTH EXTRACTION     Family History  Problem Relation Age of Onset   Alzheimer's disease Mother  Deceased   Diabetes Maternal Grandmother    Heart attack Maternal Grandmother    Prostate cancer Maternal Uncle    Diabetes Maternal Uncle    Healthy Son    Cataracts Maternal Aunt    Colon cancer Neg Hx    Esophageal cancer Neg Hx    Social History   Socioeconomic History   Marital status: Single    Spouse name: Not on file   Number of children: 0   Years of education: Not on file   Highest education level: Not on file  Occupational History   Not on file  Tobacco Use   Smoking status: Former Smoker    Packs/day: 0.25    Years: 2.00    Pack years: 0.50    Types: Cigarettes    Quit date: 06/02/1966    Years since quitting: 53.3   Smokeless tobacco: Never Used  Substance and Sexual Activity   Alcohol use: No    Alcohol/week: 0.0 standard drinks   Drug use: No   Sexual activity: Not on file  Other Topics Concern   Not on file  Social History Narrative   Not on  file   Social Determinants of Health   Financial Resource Strain: Low Risk    Difficulty of Paying Living Expenses: Not hard at all  Food Insecurity: No Food Insecurity   Worried About Charity fundraiser in the Last Year: Never true   [employer] in the Last Year: Never true  Transportation Needs: No Transportation Needs   Lack of Transportation (Medical): No   Lack of Transportation (Non-Medical): No  Physical Activity:    Days of Exercise per Week:    Minutes of Exercise per Session:   Stress:    Feeling of Stress :   Social Connections:    Frequency of Communication with Friends and Family:    Frequency of Social Gatherings with Friends and Family:    Attends Religious Services:    Active Member of Clubs or Organizations:    Attends Archivist Meetings:    Marital Status:     Outpatient Encounter Medications as of 09/23/2019  Medication Sig   aspirin (ASPIR-81) 81 MG EC tablet Take 1 tablet by mouth daily.   b complex vitamins tablet Take 1 tablet by mouth daily.   Calcium Carbonate-Vitamin D (CALCIUM-VITAMIN D3 PO) Take by mouth daily.   hydrochlorothiazide (MICROZIDE) 12.5 MG capsule TAKE 1 CAPSULE EVERY DAY   pantoprazole (PROTONIX) 40 MG tablet Take 1 tablet (40 mg total) by mouth 2 (two) times daily.   rosuvastatin (CRESTOR) 20 MG tablet Take 1 tablet (20 mg total) by mouth daily.   sertraline (ZOLOFT) 25 MG tablet Take 1 tablet (25 mg total) by mouth daily.   sucralfate (CARAFATE) 1 g tablet Take 1 tablet (1 g total) by mouth 4 (four) times daily -  with meals and at bedtime.   tamsulosin (FLOMAX) 0.4 MG CAPS capsule Take 2 capsules (0.8 mg total) by mouth daily.   timolol (TIMOPTIC) 0.5 % ophthalmic solution Place 1 drop into the left eye daily.   traMADol (ULTRAM) 50 MG tablet 1 tab po q 8 hours prn pain   benzonatate (TESSALON) 100 MG capsule Take 1 capsule (100 mg total) by mouth every 8 (eight) hours. (Patient not taking: Reported on 09/23/2019)    cyclobenzaprine (FLEXERIL) 5 MG tablet 1 tab po q hs if needed neck pain/tension ha (Patient not taking: Reported on 09/23/2019)   famotidine (PEPCID)  20 MG tablet Take 1 tablet (20 mg total) by mouth at bedtime. (Patient not taking: Reported on 09/23/2019)   meclizine (ANTIVERT) 12.5 MG tablet Take 1 tablet (12.5 mg total) by mouth 3 (three) times daily as needed for dizziness. (Patient not taking: Reported on 09/23/2019)   sertraline (ZOLOFT) 25 MG tablet Take 1 tablet (25 mg total) by mouth daily. (Patient not taking: Reported on 09/23/2019)   [DISCONTINUED] ciprofloxacin (CIPRO) 500 MG tablet Take 1 tablet (500 mg total) by mouth 2 (two) times daily.   [DISCONTINUED] predniSONE (DELTASONE) 10 MG tablet 6 TAB PO DAY 1 5 TAB PO DAY 2 4 TAB PO DAY 3 3 TAB PO DAY 4 2 TAB PO DAY 5 1 TAB PO DAY 6   No facility-administered encounter medications on file as of 09/23/2019.    Activities of Daily Living In your present state of health, do you have any difficulty performing the following activities: 09/23/2019  Hearing? N  Vision? N  Difficulty concentrating or making decisions? N  Walking or climbing stairs? N  Dressing or bathing? N  Doing errands, shopping? Y  Comment uses public transportation.  Preparing Food and eating ? N  Using the Toilet? N  In the past six months, have you accidently leaked urine? N  Do you have problems with loss of bowel control? N  Managing your Medications? N  Managing your Finances? N  Housekeeping or managing your Housekeeping? N  Some recent data might be hidden    Patient Care Team: Saguier, Iris Pert as PCP - General (Internal Medicine) Margaretmary Bayley, MD as Consulting Physician (Gastroenterology) Karie Chimera, PA-C as Physician Assistant (Pulmonary Disease) Radionchenko, Isaias Cowman, MD as Consulting Physician (Ophthalmology) Bonnee Quin, PA-C as Physician Assistant (Neurosurgery) Pamala Hurry, MD as Consulting Physician (Urology) Cameron Sprang, MD as Consulting Physician (Neurology)   Assessment:   This is a routine wellness examination for Melquan. Physical assessment deferred to PCP.  Exercise Activities and Dietary recommendations Current Exercise Habits: The patient does not participate in regular exercise at present, Exercise limited by: None identified   Diet (meal preparation, eat out, water intake, caffeinated beverages, dairy products, fruits and vegetables): in general, an "unhealthy" diet. Education provided.   Goals      Increase physical activity     Start walking 3-4 times weekly. Increase as tolerated.     Weight (lb) < 160 lb (72.6 kg)        Fall Risk Fall Risk  09/23/2019 12/29/2018 12/21/2018 02/22/2018 02/20/2017  Falls in the past year? 0 1 1 Yes Yes  Comment - Emmi Telephone Survey: data to providers prior to load - - -  Number falls in past yr: 0 1 1 2  or more 2 or more  Comment - Emmi Telephone Survey Actual Response = 2 - - -  Injury with Fall? 0 0 0 Yes No  Risk Factor Category  - - - - High Fall Risk  Risk for fall due to : - - Impaired balance/gait Impaired balance/gait Impaired vision  Follow up Education provided;Falls prevention discussed - Falls evaluation completed Education provided;Falls prevention discussed Education provided;Falls prevention discussed     Depression Screen PHQ 2/9 Scores 09/23/2019 06/28/2019 02/22/2018 02/20/2017  PHQ - 2 Score 0 3 0 2  PHQ- 9 Score - 14 - 3  Exception Documentation - - - -    Cognitive Function Ad8 score reviewed for issues: Issues making decisions:no Less interest in hobbies / activities:no Repeats questions,  stories (family complaining):no Trouble using ordinary gadgets (microwave, computer, phone):no Forgets the month or year: no Mismanaging finances: no Remembering appts:no Daily problems with thinking and/or memory:no Ad8 score is=0     MMSE - Mini Mental State Exam 02/20/2017 01/08/2016  Orientation to time 5 5  Orientation to Place 5  5  Registration 3 3  Attention/ Calculation 5 5  Recall 3 3  Language- name 2 objects 2 2  Language- repeat 1 1  Language- follow 3 step command 3 3  Language- read & follow direction 1 1  Write a sentence 1 1  Copy design 1 1  Total score 30 30   Montreal Cognitive Assessment  12/21/2018  Visuospatial/ Executive (0/5) 4  Naming (0/3) 3  Attention: Read list of digits (0/2) 2  Attention: Read list of letters (0/1) 1  Attention: Serial 7 subtraction starting at 100 (0/3) 2  Language: Repeat phrase (0/2) 1  Language : Fluency (0/1) 0  Abstraction (0/2) 1  Delayed Recall (0/5) 3  Orientation (0/6) 6  Total 23      Immunization History  Administered Date(s) Administered   Fluad Quad(high Dose 65+) 01/21/2019   Influenza, High Dose Seasonal PF 02/20/2017, 02/22/2018   Influenza,inj,Quad PF,6+ Mos 02/15/2013, 06/16/2014, 06/06/2015, 02/11/2016   Influenza-Unspecified 03/19/2011, 04/26/2012   Moderna SARS-COVID-2 Vaccination 08/11/2019, 09/13/2019   Pneumococcal Conjugate-13 01/08/2016   Pneumococcal Polysaccharide-23 11/21/2010, 06/06/2015   Tdap 11/21/2010   Zoster 02/15/2013    Screening Tests Health Maintenance  Topic Date Due   Hepatitis C Screening  Never done   COLONOSCOPY  06/05/2017   INFLUENZA VACCINE  01/01/2020   TETANUS/TDAP  11/20/2020   COVID-19 Vaccine  Completed   PNA vac Low Risk Adult  Completed       Plan:   Great to see you again. See you next year!  Continue to eat heart healthy diet (full of fruits, vegetables, whole grains, lean protein, water--limit salt, fat, and sugar intake) and increase physical activity as tolerated.  Continue doing brain stimulating activities (puzzles, reading, adult coloring books, staying active) to keep memory sharp.   Please discuss possible colonoscopy options at next PCP visit.  I have personally reviewed and noted the following in the patient's chart:   Medical and social history Use of alcohol, tobacco or  illicit drugs  Current medications and supplements Functional ability and status Nutritional status Physical activity Advanced directives List of other physicians Hospitalizations, surgeries, and ER visits in previous 12 months Vitals Screenings to include cognitive, depression, and falls Referrals and appointments  In addition, I have reviewed and discussed with patient certain preventive protocols, quality metrics, and best practice recommendations. A written personalized care plan for preventive services as well as general preventive health recommendations were provided to patient.     Shela Nevin, [place]  09/23/2019  Reviewed and agree with plan & assessment of RN. Mackie Pai, PA-C

## 2019-09-23 ENCOUNTER — Other Ambulatory Visit: Payer: Self-pay

## 2019-09-23 ENCOUNTER — Ambulatory Visit (INDEPENDENT_AMBULATORY_CARE_PROVIDER_SITE_OTHER): Payer: MEDICARE | Admitting: *Deleted

## 2019-09-23 ENCOUNTER — Encounter: Payer: Self-pay | Admitting: *Deleted

## 2019-09-23 VITALS — BP 132/80 | HR 61 | Temp 97.0°F | Ht 64.0 in | Wt 199.8 lb

## 2019-09-23 DIAGNOSIS — Z0000 Encounter for general adult medical examination without abnormal findings: Secondary | ICD-10-CM

## 2019-09-23 NOTE — Patient Instructions (Signed)
Great to see you again. See you next year!  Continue to eat heart healthy diet (full of fruits, vegetables, whole grains, lean protein, water--limit salt, fat, and sugar intake) and increase physical activity as tolerated.  Continue doing brain stimulating activities (puzzles, reading, adult coloring books, staying active) to keep memory sharp.   Please discuss possible colonoscopy options at next PCP visit.   Mr. Brian [name] , Thank you for taking time to come for your Medicare Wellness Visit. I appreciate your ongoing commitment to your health goals. Please review the following plan we discussed and let me know if I can assist you in the future.   These are the goals we discussed: Goals    . Increase physical activity     Start walking 3-4 times weekly. Increase as tolerated.    . Weight (lb) < 160 lb (72.6 kg)       This is a list of the screening recommended for you and due dates:  Health Maintenance  Topic Date Due  .  Hepatitis C: One time screening is recommended by Center for Disease Control  (CDC) for  adults born from [birth year] through 1967.   Never done  . Colon Cancer Screening  06/05/2017  . Flu Shot  01/01/2020  . Tetanus Vaccine  11/20/2020  . COVID-19 Vaccine  Completed  . Pneumonia vaccines  Completed    Preventive Care 76 Years and Older, Male Preventive care refers to lifestyle choices and visits with your health care provider that can promote health and wellness. This includes:  A yearly physical exam. This is also called an annual well check.  Regular dental and eye exams.  Immunizations.  Screening for certain conditions.  Healthy lifestyle choices, such as diet and exercise. What can I expect for my preventive care visit? Physical exam Your health care provider will check:  Height and weight. These may be used to calculate body mass index (BMI), which is a measurement that tells if you are at a healthy weight.  Heart rate and blood pressure.  Your skin  for abnormal spots. Counseling Your health care provider may ask you questions about:  Alcohol, tobacco, and drug use.  Emotional well-being.  Home and relationship well-being.  Sexual activity.  Eating habits.  History of falls.  Memory and ability to understand (cognition).  Work and work Statistician. What immunizations do I need?  Influenza (flu) vaccine  This is recommended every year. Tetanus, diphtheria, and pertussis (Tdap) vaccine  You may need a Td booster every 10 years. Varicella (chickenpox) vaccine  You may need this vaccine if you have not already been vaccinated. Zoster (shingles) vaccine  You may need this after age 66. Pneumococcal conjugate (PCV13) vaccine  One dose is recommended after age 76. Pneumococcal polysaccharide (PPSV23) vaccine  One dose is recommended after age 76. Measles, mumps, and rubella (MMR) vaccine  You may need at least one dose of MMR if you were born in 1957 or later. You may also need a second dose. Meningococcal conjugate (MenACWY) vaccine  You may need this if you have certain conditions. Hepatitis A vaccine  You may need this if you have certain conditions or if you travel or work in places where you may be exposed to hepatitis A. Hepatitis B vaccine  You may need this if you have certain conditions or if you travel or work in places where you may be exposed to hepatitis B. Haemophilus influenzae type b (Hib) vaccine  You may need this if  you have certain conditions. You may receive vaccines as individual doses or as more than one vaccine together in one shot (combination vaccines). Talk with your health care provider about the risks and benefits of combination vaccines. What tests do I need? Blood tests  Lipid and cholesterol levels. These may be checked every 5 years, or more frequently depending on your overall health.  Hepatitis C test.  Hepatitis B test. Screening  Lung cancer screening. You may have  this screening every year starting at age 76 if you have a 30-pack-year history of smoking and currently smoke or have quit within the past 15 years.  Colorectal cancer screening. All adults should have this screening starting at age 48 and continuing until age 76. Your health care provider may recommend screening at age 25 if you are at increased risk. You will have tests every 1-10 years, depending on your results and the type of screening test.  Prostate cancer screening. Recommendations will vary depending on your family history and other risks.  Diabetes screening. This is done by checking your blood sugar (glucose) after you have not eaten for a while (fasting). You may have this done every 1-3 years.  Abdominal aortic aneurysm (AAA) screening. You may need this if you are a current or former smoker.  Sexually transmitted disease (STD) testing. Follow these instructions at home: Eating and drinking  Eat a diet that includes fresh fruits and vegetables, whole grains, lean protein, and low-fat dairy products. Limit your intake of foods with high amounts of sugar, saturated fats, and salt.  Take vitamin and mineral supplements as recommended by your health care provider.  Do not drink alcohol if your health care provider tells you not to drink.  If you drink alcohol: ? Limit how much you have to 0-2 drinks a day. ? Be aware of how much alcohol is in your drink. In the [place] one drink equals one 12 oz bottle of beer (355 mL), one 5 oz glass of wine (148 mL), or one 1 oz glass of hard liquor (44 mL). Lifestyle  Take daily care of your teeth and gums.  Stay active. Exercise for at least 30 minutes on 5 or more days each week.  Do not use any products that contain nicotine or tobacco, such as cigarettes, e-cigarettes, and chewing tobacco. If you need help quitting, ask your health care provider.  If you are sexually active, practice safe sex. Use a condom or other form of protection to  prevent STIs (sexually transmitted infections).  Talk with your health care provider about taking a low-dose aspirin or statin. What's next?  Visit your health care provider once a year for a well check visit.  Ask your health care provider how often you should have your eyes and teeth checked.  Stay up to date on all vaccines. This information is not intended to replace advice given to you by your health care provider. Make sure you discuss any questions you have with your health care provider. Document Revised: 05/13/2018 Document Reviewed: 05/13/2018 Elsevier Patient Education  2020 [employer].

## 2019-10-12 ENCOUNTER — Telehealth: Payer: Self-pay | Admitting: Medical

## 2019-10-12 MED ORDER — ROSUVASTATIN CALCIUM 20 MG PO TABS: 20.0000 mg | ORAL_TABLET | Freq: Every day | ORAL | 3 refills | Status: DC

## 2019-10-12 MED ORDER — HYDROCHLOROTHIAZIDE 12.5 MG PO CAPS: 12.5000 mg | ORAL_CAPSULE | Freq: Every day | ORAL | 1 refills | Status: DC

## 2019-10-12 NOTE — Telephone Encounter (Signed)
Medication sent.

## 2019-10-12 NOTE — Telephone Encounter (Signed)
Medication:rosuvastatin (CRESTOR) 20 MG tablet [id number]    hydrochlorothiazide (MICROZIDE) 12.5 MG capsule [id number]   Has the patient contacted their pharmacy? Yes (If no, request that the patient contact the pharmacy for the refill.) (If yes, when and what did the pharmacy advise?)  Preferred Pharmacy (with phone number or street name): [facility], [address]  [address], [place] [address]  Phone:  [phone] Fax:  [phone]  DEA #:  --  Agent: Please be advised that RX refills may take up to 3 business days. We ask that you follow-up with your pharmacy.

## 2019-11-03 ENCOUNTER — Other Ambulatory Visit: Payer: Self-pay

## 2019-11-03 ENCOUNTER — Ambulatory Visit (INDEPENDENT_AMBULATORY_CARE_PROVIDER_SITE_OTHER): Payer: MEDICARE | Admitting: Medical

## 2019-11-03 VITALS — BP 122/62 | HR 59 | Resp 20 | Ht 64.0 in | Wt 196.8 lb

## 2019-11-03 DIAGNOSIS — K219 Gastro-esophageal reflux disease without esophagitis: Secondary | ICD-10-CM | POA: Diagnosis not present

## 2019-11-03 DIAGNOSIS — G44209 Tension-type headache, unspecified, not intractable: Secondary | ICD-10-CM

## 2019-11-03 DIAGNOSIS — K760 Fatty (change of) liver, not elsewhere classified: Secondary | ICD-10-CM

## 2019-11-03 DIAGNOSIS — R1013 Epigastric pain: Secondary | ICD-10-CM | POA: Diagnosis not present

## 2019-11-03 LAB — COMPREHENSIVE METABOLIC PANEL
ALT: 21 U/L (ref 0–53)
AST: 22 U/L (ref 0–37)
Albumin: 4.3 g/dL (ref 3.5–5.2)
Alkaline Phosphatase: 65 U/L (ref 39–117)
BUN: 11 mg/dL (ref 6–23)
CO2: 32 mEq/L (ref 19–32)
Calcium: 9.7 mg/dL (ref 8.4–10.5)
Chloride: 102 mEq/L (ref 96–112)
Creatinine, Ser: 1.1 mg/dL (ref 0.40–1.50)
GFR: 78.76 mL/min (ref >60.00)
Glucose, Bld: 103 mg/dL — ABNORMAL HIGH (ref 70–99)
Potassium: 3.9 mEq/L (ref 3.5–5.1)
Sodium: 138 mEq/L (ref 135–145)
Total Bilirubin: 1.6 mg/dL — ABNORMAL HIGH (ref 0.2–1.2)
Total Protein: 6.9 g/dL (ref 6.0–8.3)

## 2019-11-03 LAB — CBC WITH DIFFERENTIAL/PLATELET
Basophils Absolute: 0.1 10*3/uL (ref 0.0–0.1)
Basophils Relative: 2.4 % (ref 0.0–3.0)
Eosinophils Absolute: 0.2 10*3/uL (ref 0.0–0.7)
Eosinophils Relative: 3.9 % (ref 0.0–5.0)
HCT: 43.4 % (ref 39.0–52.0)
Hemoglobin: 14.3 g/dL (ref 13.0–17.0)
Lymphocytes Relative: 41.3 % (ref 12.0–46.0)
Lymphs Abs: 1.9 10*3/uL (ref 0.7–4.0)
MCHC: 33 g/dL (ref 30.0–36.0)
MCV: 86.1 fl (ref 78.0–100.0)
Monocytes Absolute: 0.7 10*3/uL (ref 0.1–1.0)
Monocytes Relative: 14.2 % — ABNORMAL HIGH (ref 3.0–12.0)
Neutro Abs: 1.8 10*3/uL (ref 1.4–7.7)
Neutrophils Relative %: 38.2 % — ABNORMAL LOW (ref 43.0–77.0)
Platelets: 298 10*3/uL (ref 150.0–400.0)
RBC: 5.03 Mil/uL (ref 4.22–5.81)
RDW: 13.4 % (ref 11.5–15.5)
WBC: 4.7 10*3/uL (ref 4.0–10.5)

## 2019-11-03 MED ORDER — SUCRALFATE 1 G PO TABS: 1.0000 g | ORAL_TABLET | Freq: Three times a day (TID) | ORAL | 3 refills | Status: DC

## 2019-11-03 MED ORDER — FAMOTIDINE 40 MG PO TABS: 40.0000 mg | ORAL_TABLET | Freq: Every day | ORAL | 3 refills | Status: DC

## 2019-11-03 MED ORDER — CYCLOBENZAPRINE HCL 5 MG PO TABS
ORAL_TABLET | ORAL | 0 refills | Status: DC
Start: 2019-11-03 — End: 2021-02-26

## 2019-11-03 MED FILL — CYCLOBENZAPRINE HCL 5 MG TA: 5 | 5 days supply | Qty: 5 | Fill #0

## 2019-11-03 NOTE — Patient Instructions (Addendum)
For hx of gerd, gastric polyp, barrets and mild fatty liver, I ordered labs and abd [place] today.  Rx famotadine and carafate.  Referred to gi for repeat egd.  I think you might have tension ha based on tender upper trapezius area. Will low dose flexeril to use when ha. Rx advisement given. Use only one tab daily. But let me know if stops ha.  For rt calf area that itches skin feels dry. lubriderm or aveeno.  Follow up in 3-4 weeks or as needed

## 2019-11-03 NOTE — Progress Notes (Signed)
Subjective:    Patient ID: Brian [name], male    DOB: Mar 18, 1946, 74 y.o.   MRN: [id number]  HPI  Pt in with some pain in epigastric area. Pain for last 2 weeks. He states after he eats will have pain. Pain will last for 15 minutes. He will cough intermittent throughout the day but worse after he eats. No belching. He has acid/sour taste at times in mouth. No medicine used.   Has chronic back pain. He has surgery and has stimulator. No back pain flare with recent epigastric pain.  No chest pain report or associated cardiac symptoms.   Pt had used carafate in the past and it did help little.     Review of Systems  Constitutional: Negative for chills, fatigue and fever.  Respiratory: Negative for cough, chest tightness, shortness of breath and wheezing.   Cardiovascular: Negative for chest pain and palpitations.  Gastrointestinal: Positive for abdominal pain. Negative for abdominal distention, blood in stool, constipation, diarrhea and nausea.  Genitourinary: Negative for dysuria and frequency.  Musculoskeletal: Positive for back pain.       See hpi.  Neurological: Positive for headaches. Negative for dizziness.       Random headache intemittently during day. No gross motor or sensory function deficits. Some neck pain in traps associated with ha..   Hematological: Negative for adenopathy. Does not bruise/bleed easily.   Past Medical History:  Diagnosis Date  . Atypical chest pain     Negative cardiac catheterization 2014  . Barrett's esophagus   . 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 Blind right eye   . 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 Chicken pox   . Chronic back pain   . Dislocation, jaw 1960s  . Edema    Left Leg  . Essential hypertension, benign   . GERD (gastroesophageal reflux disease)   . [place] measles   . Heart attack ([place])   . Hyperlipidemia   . Measles   . Mumps   . Osteopenia 11.12.10   bone density test  . Personal history of  other diseases of digestive system 08.10.12   Gastric ulcer     Social History   Socioeconomic History  . Marital status: Single    Spouse name: Not on file  . Number of children: 0  . Years of education: Not on file  . Highest education level: Not on file  Occupational History  . Not on file  Tobacco Use  . Smoking status: Former Smoker    Packs/day: 0.25    Years: 2.00    Pack years: 0.50    Types: Cigarettes    Quit date: 06/02/1966    Years since quitting: 53.4  . Smokeless tobacco: Never Used  Substance and Sexual Activity  . Alcohol use: No    Alcohol/week: 0.0 standard drinks  . Drug use: No  . Sexual activity: Not on file  Other Topics Concern  . Not on file  Social History Narrative  . Not on file   Social Determinants of Health   Financial Resource Strain: Low Risk   . Difficulty of Paying Living Expenses: Not hard at all  Food Insecurity: No Food Insecurity  . Worried About Charity fundraiser in the Last Year: Never true  . Ran Out of Food in the Last Year: Never true  Transportation Needs: No Transportation Needs  . Lack of Transportation (Medical): No  . Lack of Transportation (Non-Medical): No  Physical Activity:   .  Days of Exercise per Week:   . Minutes of Exercise per Session:   Stress:   . Feeling of Stress :   Social Connections:   . Frequency of Communication with Friends and Family:   . Frequency of Social Gatherings with Friends and Family:   . Attends Religious Services:   . Active Member of Clubs or Organizations:   . Attends Archivist Meetings:   Marland Kitchen Marital Status:   Intimate Partner Violence:   . Fear of Current or Ex-Partner:   . Emotionally Abused:   Marland Kitchen Physically Abused:   . Sexually Abused:     Past Surgical History:  Procedure Laterality Date  . ABDOMINAL HERNIA REPAIR  1972  . APPENDECTOMY  1973  . BACK SURGERY  10/28/2017  . CATARACT EXTRACTION, BILATERAL  2005  . CHOLECYSTECTOMY  08/2016  . COLONOSCOPY WITH  ESOPHAGOGASTRODUODENOSCOPY (EGD)     about 2016. [facility]  . ESOPHAGOGASTRODUODENOSCOPY  06.2011  . EYE SURGERY  1972   Bilateral Lens Replacement  . FACIAL RECONSTRUCTION SURGERY  2008  . LEFT HEART CATH  02.17.2014  . [place], 2008   Dislocated Jaw  . PARS PLANA VITRECTOMY W/ REPAIR OF MACULAR HOLE  2005   macular hole repair, right eye  . PARS PLANA VITRECTOMY W/ REPAIR OF MACULAR HOLE  2010   mechanical, left eye  . PROSTATE BIOPSY  [place] 2017   w/ Dr. [place] Crane  . SPINAL CORD STIMULATOR IMPLANT    . WISDOM TOOTH EXTRACTION      Family History  Problem Relation Age of Onset  . Alzheimer's disease Mother        Deceased  . Diabetes Maternal Grandmother   . Heart attack Maternal Grandmother   . Prostate cancer Maternal Uncle   . Diabetes Maternal Uncle   . Healthy Son   . Cataracts Maternal Aunt   . Colon cancer Neg Hx   . Esophageal cancer Neg Hx     Allergies  Allergen Reactions  . Gabapentin Other (See Comments)    Feel "weird"; Nausea; Weakness; Syncope    Current Outpatient Medications on File Prior to Visit  Medication Sig Dispense Refill  . aspirin (ASPIR-81) 81 MG EC tablet Take 1 tablet by mouth daily.    Marland Kitchen b complex vitamins tablet Take 1 tablet by mouth daily.    . benzonatate (TESSALON) 100 MG capsule Take 1 capsule (100 mg total) by mouth every 8 (eight) hours. (Patient not taking: Reported on 09/23/2019) 21 capsule 0  . Calcium Carbonate-Vitamin D (CALCIUM-VITAMIN D3 PO) Take by mouth daily.    . cyclobenzaprine (FLEXERIL) 5 MG tablet 1 tab po q hs if needed neck pain/tension ha (Patient not taking: Reported on 09/23/2019) 10 tablet 0  . famotidine (PEPCID) 20 MG tablet Take 1 tablet (20 mg total) by mouth at bedtime. (Patient not taking: Reported on 09/23/2019) 30 tablet 3  . hydrochlorothiazide (MICROZIDE) 12.5 MG capsule Take 1 capsule (12.5 mg total) by mouth daily. 90 capsule 1  . meclizine (ANTIVERT) 12.5 MG tablet Take 1 tablet  (12.5 mg total) by mouth 3 (three) times daily as needed for dizziness. (Patient not taking: Reported on 09/23/2019) 21 tablet 0  . pantoprazole (PROTONIX) 40 MG tablet Take 1 tablet (40 mg total) by mouth 2 (two) times daily. 180 tablet 3  . rosuvastatin (CRESTOR) 20 MG tablet Take 1 tablet (20 mg total) by mouth daily. 90 tablet 3  . sertraline (ZOLOFT) 25 MG  tablet Take 1 tablet (25 mg total) by mouth daily. 30 tablet 3  . sertraline (ZOLOFT) 25 MG tablet Take 1 tablet (25 mg total) by mouth daily. (Patient not taking: Reported on 09/23/2019) 30 tablet 0  . sucralfate (CARAFATE) 1 g tablet Take 1 tablet (1 g total) by mouth 4 (four) times daily -  with meals and at bedtime. 120 tablet 3  . tamsulosin (FLOMAX) 0.4 MG CAPS capsule Take 2 capsules (0.8 mg total) by mouth daily. 90 capsule 1  . timolol (TIMOPTIC) 0.5 % ophthalmic solution Place 1 drop into the left eye daily. 10 mL 12  . traMADol (ULTRAM) 50 MG tablet 1 tab po q 8 hours prn pain 15 tablet 0   No current facility-administered medications on file prior to visit.    BP 122/62 (BP Location: Right Arm, Patient Position: Sitting, Cuff Size: Large)   Pulse (!) 59   Resp 20   Ht 5\' 4"  (1.626 m)   Wt 196 lb 12.8 oz (89.3 kg)   SpO2 97%   BMI 33.78 kg/m       Objective:   Physical Exam  General- No acute distress. Pleasant patient. Neck- Full range of motion, no jvd Lungs- Clear, even and unlabored. Heart- regular rate and rhythm. Neurologic- CNII- XII grossly intact. Abdomen- soft, nt, nd+bs, no rebound or guarding. No organomegaly.      Assessment & Plan:  For hx of gerd, gastric polyp, barrets and mild fatty liver, I ordered labs and abd [place] today.  Rx famotadine and carafate.  Referred to gi for repeat egd.  I think you might have tension ha based on tender upper trapezius area. Will low dose flexeril to use when ha. Rx advisement given. Use only one tab daily. But let me know if stops ha.  Follow up in 3-4 weeks  or as needed  Mackie Pai, PA-C   Time spent with patient today was 35  minutes which consisted of chart review, discussing diagnosis, work up, treatment and documentation.

## 2019-11-08 ENCOUNTER — Other Ambulatory Visit: Payer: Self-pay

## 2019-11-08 ENCOUNTER — Ambulatory Visit (HOSPITAL_BASED_OUTPATIENT_CLINIC_OR_DEPARTMENT_OTHER)
Admission: RE | Admit: 2019-11-08 | Discharge: 2019-11-08 | Disposition: A | Discharge status: Home / Self Care | Payer: MEDICARE | Source: Ambulatory Visit | Attending: Medical | Admitting: Medical

## 2019-11-08 DIAGNOSIS — K760 Fatty (change of) liver, not elsewhere classified: Secondary | ICD-10-CM | POA: Diagnosis not present

## 2019-11-08 DIAGNOSIS — R1013 Epigastric pain: Secondary | ICD-10-CM

## 2019-11-23 ENCOUNTER — Telehealth: Payer: Self-pay | Admitting: Medical

## 2019-11-23 DIAGNOSIS — R300 Dysuria: Secondary | ICD-10-CM

## 2019-11-23 DIAGNOSIS — R3911 Hesitancy of micturition: Secondary | ICD-10-CM

## 2019-11-23 NOTE — Telephone Encounter (Signed)
Caller:Brianda ([place]) Call back phone number: [phone] Fax [phone]  Requesting a refill on prednisone 10 mg tablets  Please advise

## 2019-11-25 NOTE — Telephone Encounter (Signed)
[employer] sent over a request to refill patient's prednisone 10 mg and tamsulosin .4 mg ( last filled in 2018) .. are these refills appropriate ?

## 2019-11-26 NOTE — Telephone Encounter (Signed)
By review I don't see chronic daily use prednisone indicated. About flomax. If you would touch base with pt and ask if he has urinary issues such as frequency or hesitant urine flow. If so I will refill flomax. If not then won't refill. Looks like Dr. Nani Ravens filled that for him 3 years ago.

## 2019-11-28 MED ORDER — TAMSULOSIN HCL 0.4 MG PO CAPS: 0.8000 mg | ORAL_CAPSULE | Freq: Every day | ORAL | 1 refills | Status: AC

## 2019-11-28 NOTE — Telephone Encounter (Signed)
Patient states he believes that [facility] sent the refill paper out as an automatic refill attempt... states he would like a refill because he still does have urinary issues off and on.

## 2019-11-28 NOTE — Telephone Encounter (Signed)
Rx refill flomax  sent to pt pharmacy.

## 2019-12-12 ENCOUNTER — Ambulatory Visit: Payer: MEDICARE | Admitting: Gastroenterology

## 2020-01-02 ENCOUNTER — Other Ambulatory Visit: Payer: Self-pay | Admitting: Medical

## 2020-01-05 ENCOUNTER — Other Ambulatory Visit: Payer: Self-pay

## 2020-01-05 ENCOUNTER — Ambulatory Visit (INDEPENDENT_AMBULATORY_CARE_PROVIDER_SITE_OTHER): Payer: MEDICARE | Admitting: Medical

## 2020-01-05 ENCOUNTER — Other Ambulatory Visit: Payer: Self-pay | Admitting: Medical

## 2020-01-05 ENCOUNTER — Ambulatory Visit (HOSPITAL_BASED_OUTPATIENT_CLINIC_OR_DEPARTMENT_OTHER)
Admission: RE | Admit: 2020-01-05 | Discharge: 2020-01-05 | Disposition: A | Discharge status: Home / Self Care | Payer: MEDICARE | Source: Ambulatory Visit | Attending: Medical | Admitting: Medical

## 2020-01-05 VITALS — BP 130/78 | HR 56 | Resp 18 | Ht 64.0 in | Wt 195.4 lb

## 2020-01-05 DIAGNOSIS — M79641 Pain in right hand: Secondary | ICD-10-CM

## 2020-01-05 DIAGNOSIS — M25572 Pain in left ankle and joints of left foot: Secondary | ICD-10-CM | POA: Diagnosis not present

## 2020-01-05 DIAGNOSIS — M2550 Pain in unspecified joint: Secondary | ICD-10-CM | POA: Diagnosis not present

## 2020-01-05 LAB — C-REACTIVE PROTEIN: CRP: <1 mg/dL (ref 0.5–20.0)

## 2020-01-05 LAB — URIC ACID: Uric Acid, Serum: 4.3 mg/dL (ref 4.0–7.8)

## 2020-01-05 LAB — SEDIMENTATION RATE: Sed Rate: 4 mm/hr (ref 0–20)

## 2020-01-05 NOTE — Progress Notes (Signed)
Subjective:    Patient ID: Brian [name], male    DOB: 1946/03/16, 74 y.o.   MRN: [id number]  HPI  Pt in with left ankle pain that radiates up leg. Pain for one week. Pt denies twisting ankle or any injury. Pt states pain when standing and walking but can random as well.   No meds used for this pain.   Pt also has some left sciatic area pain. He noes some lateral thigh pain and points to lateral calf pain. But he correlates pain in thigh and lateral calf with ankle and not si area. Pt has nerve stimulator for low back pain.    Review of Systems  Constitutional: Negative for chills, fatigue and fever.  Respiratory: Negative for cough, chest tightness, shortness of breath and wheezing.   Cardiovascular: Negative for chest pain and palpitations.  Gastrointestinal: Negative for abdominal pain.  Musculoskeletal:       Left ankle pain.  Left si pain. Rt hand pain  Skin: Negative for rash.  Hematological: Negative for adenopathy. Does not bruise/bleed easily.  Psychiatric/Behavioral: Negative for behavioral problems and decreased concentration.    Past Medical History:  Diagnosis Date  . Atypical chest pain     Negative cardiac catheterization 2014  . Barrett's esophagus   . 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 Blind right eye   . 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 Chicken pox   . Chronic back pain   . Dislocation, jaw 1960s  . Edema    Left Leg  . Essential hypertension, benign   . GERD (gastroesophageal reflux disease)   . [place] measles   . Heart attack ([place])   . Hyperlipidemia   . Measles   . Mumps   . Osteopenia 11.12.10   bone density test  . Personal history of other diseases of digestive system 08.10.12   Gastric ulcer     Social History   Socioeconomic History  . Marital status: Single    Spouse name: Not on file  . Number of children: 0  . Years of education: Not on file  . Highest education level: Not on file  Occupational  History  . Not on file  Tobacco Use  . Smoking status: Former Smoker    Packs/day: 0.25    Years: 2.00    Pack years: 0.50    Types: Cigarettes    Quit date: 06/02/1966    Years since quitting: 53.6  . Smokeless tobacco: Never Used  Vaping Use  . Vaping Use: Never used  Substance and Sexual Activity  . Alcohol use: No    Alcohol/week: 0.0 standard drinks  . Drug use: No  . Sexual activity: Not on file  Other Topics Concern  . Not on file  Social History Narrative  . Not on file   Social Determinants of Health   Financial Resource Strain: Low Risk   . Difficulty of Paying Living Expenses: Not hard at all  Food Insecurity: No Food Insecurity  . Worried About Charity fundraiser in the Last Year: Never true  . Ran Out of Food in the Last Year: Never true  Transportation Needs: No Transportation Needs  . Lack of Transportation (Medical): No  . Lack of Transportation (Non-Medical): No  Physical Activity:   . Days of Exercise per Week:   . Minutes of Exercise per Session:   Stress:   . Feeling of Stress :   Social Connections:   . Frequency of Communication with  Friends and Family:   . Frequency of Social Gatherings with Friends and Family:   . Attends Religious Services:   . Active Member of Clubs or Organizations:   . Attends Archivist Meetings:   Marland Kitchen Marital Status:   Intimate Partner Violence:   . Fear of Current or Ex-Partner:   . Emotionally Abused:   Marland Kitchen Physically Abused:   . Sexually Abused:     Past Surgical History:  Procedure Laterality Date  . ABDOMINAL HERNIA REPAIR  1972  . APPENDECTOMY  1973  . BACK SURGERY  10/28/2017  . CATARACT EXTRACTION, BILATERAL  2005  . CHOLECYSTECTOMY  08/2016  . COLONOSCOPY WITH ESOPHAGOGASTRODUODENOSCOPY (EGD)     about 2016. [facility]  . ESOPHAGOGASTRODUODENOSCOPY  06.2011  . EYE SURGERY  1972   Bilateral Lens Replacement  . FACIAL RECONSTRUCTION SURGERY  2008  . LEFT HEART CATH  02.17.2014  .  [place], 2008   Dislocated Jaw  . PARS PLANA VITRECTOMY W/ REPAIR OF MACULAR HOLE  2005   macular hole repair, right eye  . PARS PLANA VITRECTOMY W/ REPAIR OF MACULAR HOLE  2010   mechanical, left eye  . PROSTATE BIOPSY  [place] 2017   w/ Dr. [place] Crane  . SPINAL CORD STIMULATOR IMPLANT    . WISDOM TOOTH EXTRACTION      Family History  Problem Relation Age of Onset  . Alzheimer's disease Mother        Deceased  . Diabetes Maternal Grandmother   . Heart attack Maternal Grandmother   . Prostate cancer Maternal Uncle   . Diabetes Maternal Uncle   . Healthy Son   . Cataracts Maternal Aunt   . Colon cancer Neg Hx   . Esophageal cancer Neg Hx     Allergies  Allergen Reactions  . Gabapentin Other (See Comments)    Feel "weird"; Nausea; Weakness; Syncope    Current Outpatient Medications on File Prior to Visit  Medication Sig Dispense Refill  . aspirin (ASPIR-81) 81 MG EC tablet Take 1 tablet by mouth daily.    Marland Kitchen b complex vitamins tablet Take 1 tablet by mouth daily.    . benzonatate (TESSALON) 100 MG capsule Take 1 capsule (100 mg total) by mouth every 8 (eight) hours. (Patient not taking: Reported on 09/23/2019) 21 capsule 0  . Calcium Carbonate-Vitamin D (CALCIUM-VITAMIN D3 PO) Take by mouth daily.    . cyclobenzaprine (FLEXERIL) 5 MG tablet 1 tab po q hs if needed neck pain/tension ha (Patient not taking: Reported on 09/23/2019) 10 tablet 0  . cyclobenzaprine (FLEXERIL) 5 MG tablet 1 tab po once daily max prn neck pain with ha 5 tablet 0  . famotidine (PEPCID) 40 MG tablet TAKE 1 TABLET EVERY DAY 90 tablet 0  . hydrochlorothiazide (MICROZIDE) 12.5 MG capsule Take 1 capsule (12.5 mg total) by mouth daily. 90 capsule 1  . meclizine (ANTIVERT) 12.5 MG tablet Take 1 tablet (12.5 mg total) by mouth 3 (three) times daily as needed for dizziness. (Patient not taking: Reported on 09/23/2019) 21 tablet 0  . rosuvastatin (CRESTOR) 20 MG tablet Take 1 tablet (20 mg total) by mouth  daily. 90 tablet 3  . sertraline (ZOLOFT) 25 MG tablet Take 1 tablet (25 mg total) by mouth daily. 30 tablet 3  . sertraline (ZOLOFT) 25 MG tablet Take 1 tablet (25 mg total) by mouth daily. (Patient not taking: Reported on 09/23/2019) 30 tablet 0  . sucralfate (CARAFATE) 1 g tablet TAKE  1 TABLET (1 GRAM TOTAL) BY MOUTH 4 (FOUR) TIMES DAILY -  WITH MEALS AND AT BEDTIME. 360 tablet 2  . tamsulosin (FLOMAX) 0.4 MG CAPS capsule Take 2 capsules (0.8 mg total) by mouth daily. 90 capsule 1  . timolol (TIMOPTIC) 0.5 % ophthalmic solution Place 1 drop into the left eye daily. 10 mL 12  . traMADol (ULTRAM) 50 MG tablet 1 tab po q 8 hours prn pain 15 tablet 0   No current facility-administered medications on file prior to visit.    BP 130/78   Pulse (!) 56   Resp 18   Ht 5\' 4"  (1.626 m)   Wt 195 lb 6.4 oz (88.6 kg)   SpO2 98%   BMI 33.54 kg/m       Objective:   Physical Exam  General- No acute distress. Pleasant patient. Neck- Full range of motion, no jvd Lungs- Clear, even and unlabored. Heart- regular rate and rhythm. Neurologic- CNII- XII grossly intact.  Lower back- left si tenderness. Left ankle- talofibular tenderness. Mild swollen. Not warm to touch. Pain on movement.  Left foot- no pain on palpation.  Rt hand- pain over mcp joints. Rt thumb pain on finklestein test.      Assessment & Plan:  For your left ankle area pain, will get x-ray.  Pain on exam is to be over the talofibular ligament.  Gave Ace wrap to use daily.  Recommend combination of low-dose ibuprofen 400 to 600 mg every 8 hours along with low-dose Tylenol.  For right hand and thumb pain, I placed x-rays for right hand.  You also have findings on exam indicating tenosynovitis.  Would recommend she get over-the-counter thumb spica splint.  Use daily.  Above medications should help as well.  With various arthralgias we will go ahead and get inflammatory lab studies.  Want you to follow-up week of August 16 early  morning and come in fasting.  Want to get lipid panel and metabolic panel.  Also can do Mini-Mental status exam on that date for your memory concerns.  Mackie Pai, PA-C    Time spent with patient today was 30  minutes which consisted of chart review, discussing diagnosis, work up treatment and documentation.

## 2020-01-05 NOTE — Patient Instructions (Addendum)
For your left ankle area pain, will get x-ray.  Pain on exam is to be over the talofibular ligament.  Gave Ace wrap to use daily.  Recommend combination of low-dose ibuprofen 400 to 600 mg every 8 hours along with low-dose Tylenol.  For right hand and thumb pain, I placed x-rays for right hand.  You also have findings on exam indicating tenosynovitis.  Would recommend she get over-the-counter thumb spica splint.  Use daily.  Above medications should help as well.  With various arthralgias we will go ahead and get inflammatory lab studies.  Want you to follow-up week of August 16 early morning and come in fasting.  Want to get lipid panel and metabolic panel.  Also can do Mini-Mental status exam on that date for your memory concerns.

## 2020-01-06 LAB — RHEUMATOID FACTOR: Rheumatoid fact SerPl-aCnc: <14 IU/mL (ref <14)

## 2020-01-06 LAB — ANA: Anti Nuclear Antibody (ANA): NEGATIVE

## 2020-01-17 ENCOUNTER — Ambulatory Visit (HOSPITAL_BASED_OUTPATIENT_CLINIC_OR_DEPARTMENT_OTHER)
Admission: RE | Admit: 2020-01-17 | Discharge: 2020-01-17 | Disposition: A | Discharge status: Home / Self Care | Payer: MEDICARE | Source: Ambulatory Visit | Attending: Medical | Admitting: Medical

## 2020-01-17 ENCOUNTER — Ambulatory Visit (INDEPENDENT_AMBULATORY_CARE_PROVIDER_SITE_OTHER): Payer: MEDICARE | Admitting: Medical

## 2020-01-17 ENCOUNTER — Other Ambulatory Visit: Payer: Self-pay

## 2020-01-17 ENCOUNTER — Telehealth: Payer: Self-pay | Admitting: Medical

## 2020-01-17 VITALS — BP 129/78 | HR 57 | Resp 18 | Ht 64.0 in | Wt 193.8 lb

## 2020-01-17 DIAGNOSIS — R413 Other amnesia: Secondary | ICD-10-CM

## 2020-01-17 DIAGNOSIS — M25559 Pain in unspecified hip: Secondary | ICD-10-CM | POA: Insufficient documentation

## 2020-01-17 MED ORDER — PREDNISONE 10 MG (21) PO TBPK
ORAL_TABLET | ORAL | 0 refills | Status: DC
Start: 2020-01-17 — End: 2020-04-19

## 2020-01-17 MED FILL — predniSONE 10 MG TABS: 10 | 5 days supply | Qty: 21 | Fill #0

## 2020-01-17 NOTE — Telephone Encounter (Signed)
Referral to neurologist placed. 

## 2020-01-17 NOTE — Patient Instructions (Signed)
History of memory issues recently.  Description of moderately affecting his day-to-day activities and you also describe strong family history of dementia on your mother's side.  However on your Mini-Mental status exam you scored 29 out of 30.  I do think with your daily challenges you describe and strong family history that I will go ahead and refer you to neurologist for further evaluation.  I do agree with your family member that you should not be operating the oven since you have listed on numerous times.  For recent left hip pain 8 out of 10 level, I do want you to get left hip x-ray today.  Since you have minimal improvement with ibuprofen decided to go ahead and send the prescription of 6-day tapered prednisone.  Rx advisement given.  Follow-up date to be determined based on x-ray review and neurologist note reviewed.  Also will see how you respond to medication prescribed today.

## 2020-01-17 NOTE — Progress Notes (Signed)
Subjective:    Patient ID: Brian [name], male    DOB: 11-Feb-1946, 74 y.o.   MRN: [id number]  HPI  Pt in for follow up.  Pt states has hard time recognizing persons and remembering names. Even if not wearing mask. He states he will often forget in middle of conversation topics of conversation. He is misplaces things a lot and can't remember.Places dishes in wrong places. One time put dishes in oven 4 times in past. Pt mother had some dementia. Strong family history of that on mother side.   Pt mentioned one day left oven on. He lives with cousin and kids.  Last 4 days had moderate to severe left hip pain  level pain level 8/10. In past 2 years ago xray showed. When stands up pain will hurt worse.  IMPRESSION: There is no acute bony abnormality of the left hip. 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  Pt states ibuprofen helps a little bit.   Review of Systems  Constitutional: Negative for chills, fatigue and fever.  Respiratory: Negative for cough, chest tightness, shortness of breath and wheezing.   Cardiovascular: Negative for chest pain and palpitations.  Gastrointestinal: Negative for abdominal pain.  Genitourinary: Negative for flank pain and frequency.  Musculoskeletal: Negative for back pain.  Skin: Negative for rash.  Neurological: Negative for dizziness, speech difficulty, weakness, light-headedness and headaches.       Memory problems.  Hematological: Negative for adenopathy. Does not bruise/bleed easily.  Psychiatric/Behavioral: Negative for behavioral problems, confusion, dysphoric mood and suicidal ideas. The patient is not nervous/anxious.      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   Chicken pox    Chronic back pain    Dislocation, jaw 1960s    Edema    Left Leg   Essential hypertension, benign    GERD (gastroesophageal reflux disease)    [place] measles    Heart attack ([place])    Hyperlipidemia    Measles    Mumps    Osteopenia 11.12.10   bone density test   Personal history of other diseases of digestive system 08.10.12   Gastric ulcer     Social History   Socioeconomic History   Marital status: Single    Spouse name: Not on file   Number of children: 0   Years of education: Not on file   Highest education level: Not on file  Occupational History   Not on file  Tobacco Use   Smoking status: Former Smoker    Packs/day: 0.25    Years: 2.00    Pack years: 0.50    Types: Cigarettes    Quit date: 06/02/1966    Years since quitting: 53.6   Smokeless tobacco: Never Used  Vaping Use   Vaping Use: Never used  Substance and Sexual Activity   Alcohol use: No    Alcohol/week: 0.0 standard drinks   Drug use: No   Sexual activity: Not on file  Other Topics Concern   Not on file  Social History Narrative   Not on file   Social Determinants of Health   Financial Resource Strain: Low Risk    Difficulty of Paying Living Expenses: Not hard at all  Food Insecurity: No Food Insecurity   Worried About Running Out  of Food in the Last Year: Never true   [place] in the Last Year: Never true  Transportation Needs: No Transportation Needs   Lack of Transportation (Medical): No   Lack of Transportation (Non-Medical): No  Physical Activity:    Days of Exercise per Week:    Minutes of Exercise per Session:   Stress:    Feeling of Stress :   Social Connections:    Frequency of Communication with Friends and Family:    Frequency of Social Gatherings with Friends and Family:    Attends Religious Services:    Active Member of Clubs or Organizations:    Attends Music therapist:    Marital Status:   Intimate Partner Violence:    Fear of Current or Ex-Partner:     Emotionally Abused:    Physically Abused:    Sexually Abused:     Past Surgical History:  Procedure Laterality Date   ABDOMINAL HERNIA [place] SURGERY  10/28/2017   CATARACT EXTRACTION, BILATERAL  2005   CHOLECYSTECTOMY  08/2016   COLONOSCOPY WITH ESOPHAGOGASTRODUODENOSCOPY (EGD)     about 2016. [place] [facility]   ESOPHAGOGASTRODUODENOSCOPY  06.2011   EYE SURGERY  1972   Bilateral Lens Replacement   FACIAL RECONSTRUCTION SURGERY  2008   LEFT HEART CATH  02.17.2014   MANDIBLE SURGERY  1968, 2008   Dislocated Jaw   PARS PLANA VITRECTOMY W/ REPAIR OF MACULAR HOLE  2005   macular hole repair, right eye   PARS PLANA VITRECTOMY W/ REPAIR OF MACULAR HOLE  2010   mechanical, left eye   PROSTATE BIOPSY  [place] 2017   w/ Dr. [place] Crane   SPINAL CORD STIMULATOR IMPLANT     WISDOM TOOTH EXTRACTION      Family History  Problem Relation Age of Onset   Alzheimer's disease Mother        Deceased   Diabetes Maternal Grandmother    Heart attack Maternal Grandmother    Prostate cancer Maternal Uncle    Diabetes Maternal Uncle    Healthy Son    Cataracts Maternal Aunt    Colon cancer Neg Hx    Esophageal cancer Neg Hx     Allergies  Allergen Reactions   Gabapentin Other (See Comments)    Feel "weird"; Nausea; Weakness; Syncope    Current Outpatient Medications on File Prior to Visit  Medication Sig Dispense Refill   aspirin (ASPIR-81) 81 MG EC tablet Take 1 tablet by mouth daily.     b complex vitamins tablet Take 1 tablet by mouth daily.     benzonatate (TESSALON) 100 MG capsule Take 1 capsule (100 mg total) by mouth every 8 (eight) hours. (Patient not taking: Reported on 09/23/2019) 21 capsule 0   Calcium Carbonate-Vitamin D (CALCIUM-VITAMIN D3 PO) Take by mouth daily.     cyclobenzaprine (FLEXERIL) 5 MG tablet 1 tab po q hs if needed neck pain/tension ha (Patient not taking: Reported on 09/23/2019) 10 tablet 0    cyclobenzaprine (FLEXERIL) 5 MG tablet 1 tab po once daily max prn neck pain with ha 5 tablet 0   famotidine (PEPCID) 40 MG tablet TAKE 1 TABLET EVERY DAY 90 tablet 0   hydrochlorothiazide (MICROZIDE) 12.5 MG capsule Take 1 capsule (12.5 mg total) by mouth daily. 90 capsule 1   meclizine (ANTIVERT) 12.5 MG tablet Take 1 tablet (12.5 mg total) by mouth 3 (three) times daily as needed for dizziness. (  Patient not taking: Reported on 09/23/2019) 21 tablet 0   rosuvastatin (CRESTOR) 20 MG tablet Take 1 tablet (20 mg total) by mouth daily. 90 tablet 3   sertraline (ZOLOFT) 25 MG tablet Take 1 tablet (25 mg total) by mouth daily. 30 tablet 3   sertraline (ZOLOFT) 25 MG tablet Take 1 tablet (25 mg total) by mouth daily. (Patient not taking: Reported on 09/23/2019) 30 tablet 0   sucralfate (CARAFATE) 1 g tablet TAKE 1 TABLET (1 GRAM TOTAL) BY MOUTH 4 (FOUR) TIMES DAILY -  WITH MEALS AND AT BEDTIME. 360 tablet 2   tamsulosin (FLOMAX) 0.4 MG CAPS capsule Take 2 capsules (0.8 mg total) by mouth daily. 90 capsule 1   timolol (TIMOPTIC) 0.5 % ophthalmic solution Place 1 drop into the left eye daily. 10 mL 12   traMADol (ULTRAM) 50 MG tablet 1 tab po q 8 hours prn pain 15 tablet 0   No current facility-administered medications on file prior to visit.    BP 129/78 (BP Location: Left Arm, Patient Position: Sitting, Cuff Size: Large)    Pulse (!) 57    Resp 18    Ht 5\' 4"  (1.626 m)    Wt 193 lb 12.8 oz (87.9 kg)    SpO2 99%    BMI 33.27 kg/m       Objective:   Physical Exam  General- No acute distress. Pleasant patient. Neck- Full range of motion, no jvd Lungs- Clear, even and unlabored. Heart- regular rate and rhythm. Neurologic- CNII- XII grossly intact.  Left hip- pain on rom and palpation.        Assessment & Plan:  History of memory issues recently.  Description of moderately affecting his day-to-day activities and you also describe strong family history of dementia on your mother's  side.  However on your Mini-Mental status exam you scored 29 out of 30.  I do think with your daily challenges you describe and strong family history that I will go ahead and refer you to neurologist for further evaluation.  I do agree with your family member that you should not be operating the oven since you have listed on numerous times.  For recent left hip pain 8 out of 10 level, I do want you to get left hip x-ray today.  Since you have minimal improvement with ibuprofen decided to go ahead and send the prescription of 6-day tapered prednisone.  Rx advisement given.  Follow-up date to be determined based on x-ray review and neurologist note reviewed.  Also will see how you respond to medication prescribed today.  Time spent with patient today was 40 minutes which consisted of chart review, discussing diagnoses, work up, treatment, referral, performing mini mental status exam, explaining results and documentation.

## 2020-03-16 ENCOUNTER — Other Ambulatory Visit: Payer: Self-pay

## 2020-03-16 ENCOUNTER — Encounter: Payer: Self-pay | Admitting: Medical

## 2020-03-16 ENCOUNTER — Other Ambulatory Visit: Payer: Self-pay | Admitting: Medical

## 2020-03-16 ENCOUNTER — Ambulatory Visit (INDEPENDENT_AMBULATORY_CARE_PROVIDER_SITE_OTHER): Payer: MEDICARE | Admitting: Medical

## 2020-03-16 VITALS — BP 138/64 | HR 55 | Resp 18 | Ht 64.0 in | Wt 198.8 lb

## 2020-03-16 DIAGNOSIS — R350 Frequency of micturition: Secondary | ICD-10-CM

## 2020-03-16 DIAGNOSIS — Z23 Encounter for immunization: Secondary | ICD-10-CM | POA: Diagnosis not present

## 2020-03-16 DIAGNOSIS — R102 Pelvic and perineal pain: Secondary | ICD-10-CM | POA: Diagnosis not present

## 2020-03-16 LAB — POC URINALSYSI DIPSTICK (AUTOMATED)
Bilirubin, UA: NEGATIVE
Blood, UA: NEGATIVE
Glucose, UA: NEGATIVE
Ketones, UA: NEGATIVE
Leukocytes, UA: NEGATIVE
Nitrite, UA: NEGATIVE
Protein, UA: NEGATIVE
Spec Grav, UA: 1.02 (ref 1.010–1.025)
Urobilinogen, UA: 0.2 E.U./dL
pH, UA: 5.5 (ref 5.0–8.0)

## 2020-03-16 MED ORDER — CIPROFLOXACIN HCL 500 MG PO TABS: 500.0000 mg | ORAL_TABLET | Freq: Two times a day (BID) | ORAL | 0 refills | Status: DC

## 2020-03-16 MED FILL — CIPROFLOXACIN HCL 500 MG TA: 500 | 10 days supply | Qty: 20 | Fill #0

## 2020-03-16 NOTE — Patient Instructions (Addendum)
For frequent urination and suprapubic pressure will get ua,urine culture and psa. Treat for uti and possible prostatitis pending study results.  Keep urologist appointment coming up in January.   Depending on your studies and how your respond may try to expidite your appointment.  Follow up 10-14  days or as needed.

## 2020-03-16 NOTE — Progress Notes (Signed)
Subjective:    Patient ID: Brian [name], male    DOB: [birth year], 74 y.o.   MRN: [id number]  HPI Pt in with some frequent urination and some suprapubic pressure. Frequent urination more during day. Getting up 3 times at night. Feel like someon standing over suprapubic area. Pt mentions that he had 3 family members who had prostate cancer.   Pt last psa 7 months ago was low/normal.  Pt has appointment with Dr. Nevada Crane urologist in January.  Pt is on flomax.  Pt declines flu vaccine today.   Review of Systems  Constitutional: Negative for chills, fatigue and fever.  Respiratory: Negative for cough, chest tightness, shortness of breath and wheezing.   Cardiovascular: Negative for chest pain and palpitations.  Genitourinary: Positive for frequency and urgency. Negative for decreased urine volume, discharge, dysuria, scrotal swelling and testicular pain.       Occasional urine flow dripples. Then has to go again after urinating.  Musculoskeletal: Negative for back pain.  Skin: Negative for rash.  Hematological: Negative for adenopathy. Does not bruise/bleed easily.    Past Medical History:  Diagnosis Date  . Atypical chest pain     Negative cardiac catheterization 2014  . Barrett's esophagus   . 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 Blind right eye   . 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 Chicken pox   . Chronic back pain   . Dislocation, jaw 1960s  . Edema    Left Leg  . Essential hypertension, benign   . GERD (gastroesophageal reflux disease)   . [place] measles   . Heart attack ([place])   . Hyperlipidemia   . Measles   . Mumps   . Osteopenia 11.12.10   bone density test  . Personal history of other diseases of digestive system 08.10.12   Gastric ulcer     Social History   Socioeconomic History  . Marital status: Single    Spouse name: Not on file  . Number of children: 0  . Years of education: Not on file  . Highest education level: Not on file    Occupational History  . Not on file  Tobacco Use  . Smoking status: Former Smoker    Packs/day: 0.25    Years: 2.00    Pack years: 0.50    Types: Cigarettes    Quit date: 06/02/1966    Years since quitting: 53.8  . Smokeless tobacco: Never Used  Vaping Use  . Vaping Use: Never used  Substance and Sexual Activity  . Alcohol use: No    Alcohol/week: 0.0 standard drinks  . Drug use: No  . Sexual activity: Not on file  Other Topics Concern  . Not on file  Social History Narrative  . Not on file   Social Determinants of Health   Financial Resource Strain: Low Risk   . Difficulty of Paying Living Expenses: Not hard at all  Food Insecurity: No Food Insecurity  . Worried About Charity fundraiser in the Last Year: Never true  . Ran Out of Food in the Last Year: Never true  Transportation Needs: No Transportation Needs  . Lack of Transportation (Medical): No  . Lack of Transportation (Non-Medical): No  Physical Activity:   . Days of Exercise per Week: Not on file  . Minutes of Exercise per Session: Not on file  Stress:   . Feeling of Stress : Not on file  Social Connections:   . Frequency of Communication with Friends  and Family: Not on file  . Frequency of Social Gatherings with Friends and Family: Not on file  . Attends Religious Services: Not on file  . Active Member of Clubs or Organizations: Not on file  . Attends Archivist Meetings: Not on file  . Marital Status: Not on file  Intimate Partner Violence:   . Fear of Current or Ex-Partner: Not on file  . Emotionally Abused: Not on file  . Physically Abused: Not on file  . Sexually Abused: Not on file    Past Surgical History:  Procedure Laterality Date  . ABDOMINAL HERNIA REPAIR  1972  . APPENDECTOMY  1973  . BACK SURGERY  10/28/2017  . CATARACT EXTRACTION, BILATERAL  2005  . CHOLECYSTECTOMY  08/2016  . COLONOSCOPY WITH ESOPHAGOGASTRODUODENOSCOPY (EGD)     about 2016. [facility]  .  ESOPHAGOGASTRODUODENOSCOPY  06.2011  . EYE SURGERY  1972   Bilateral Lens Replacement  . FACIAL RECONSTRUCTION SURGERY  2008  . LEFT HEART CATH  02.17.2014  . [place], 2008   Dislocated Jaw  . PARS PLANA VITRECTOMY W/ REPAIR OF MACULAR HOLE  2005   macular hole repair, right eye  . PARS PLANA VITRECTOMY W/ REPAIR OF MACULAR HOLE  2010   mechanical, left eye  . PROSTATE BIOPSY  [place] 2017   w/ Dr. [place] Crane  . SPINAL CORD STIMULATOR IMPLANT    . WISDOM TOOTH EXTRACTION      Family History  Problem Relation Age of Onset  . Alzheimer's disease Mother        Deceased  . Diabetes Maternal Grandmother   . Heart attack Maternal Grandmother   . Prostate cancer Maternal Uncle   . Diabetes Maternal Uncle   . Healthy Son   . Cataracts Maternal Aunt   . Colon cancer Neg Hx   . Esophageal cancer Neg Hx     Allergies  Allergen Reactions  . Gabapentin Other (See Comments)    Feel "weird"; Nausea; Weakness; Syncope    Current Outpatient Medications on File Prior to Visit  Medication Sig Dispense Refill  . aspirin (ASPIR-81) 81 MG EC tablet Take 1 tablet by mouth daily.    Marland Kitchen b complex vitamins tablet Take 1 tablet by mouth daily.    . Calcium Carbonate-Vitamin D (CALCIUM-VITAMIN D3 PO) Take by mouth daily.    . cyclobenzaprine (FLEXERIL) 5 MG tablet 1 tab po once daily max prn neck pain with ha 5 tablet 0  . famotidine (PEPCID) 40 MG tablet TAKE 1 TABLET EVERY DAY 90 tablet 0  . hydrochlorothiazide (MICROZIDE) 12.5 MG capsule Take 1 capsule (12.5 mg total) by mouth daily. 90 capsule 1  . predniSONE (STERAPRED UNI-PAK 21 TAB) 10 MG (21) TBPK tablet Standard Taper over 6 days. 21 tablet 0  . rosuvastatin (CRESTOR) 20 MG tablet Take 1 tablet (20 mg total) by mouth daily. 90 tablet 3  . sertraline (ZOLOFT) 25 MG tablet Take 1 tablet (25 mg total) by mouth daily. 30 tablet 3  . sucralfate (CARAFATE) 1 g tablet TAKE 1 TABLET (1 GRAM TOTAL) BY MOUTH 4 (FOUR) TIMES DAILY -  WITH  MEALS AND AT BEDTIME. 360 tablet 2  . timolol (TIMOPTIC) 0.5 % ophthalmic solution Place 1 drop into the left eye daily. 10 mL 12  . traMADol (ULTRAM) 50 MG tablet 1 tab po q 8 hours prn pain 15 tablet 0  . benzonatate (TESSALON) 100 MG capsule Take 1 capsule (100 mg total) by  mouth every 8 (eight) hours. (Patient not taking: Reported on 09/23/2019) 21 capsule 0  . cyclobenzaprine (FLEXERIL) 5 MG tablet 1 tab po q hs if needed neck pain/tension ha (Patient not taking: Reported on 09/23/2019) 10 tablet 0  . meclizine (ANTIVERT) 12.5 MG tablet Take 1 tablet (12.5 mg total) by mouth 3 (three) times daily as needed for dizziness. (Patient not taking: Reported on 09/23/2019) 21 tablet 0  . sertraline (ZOLOFT) 25 MG tablet Take 1 tablet (25 mg total) by mouth daily. (Patient not taking: Reported on 09/23/2019) 30 tablet 0  . tamsulosin (FLOMAX) 0.4 MG CAPS capsule Take 2 capsules (0.8 mg total) by mouth daily. (Patient not taking: Reported on 03/16/2020) 90 capsule 1   No current facility-administered medications on file prior to visit.    BP 138/64   Pulse (!) 55   Resp 18   Ht 5\' 4"  (1.626 m)   Wt 198 lb 12.8 oz (90.2 kg)   SpO2 100%   BMI 34.12 kg/m       Objective:   Physical Exam  General Mental Status- Alert. General Appearance- Not in acute distress.   Skin General: Color- Normal Color. Moisture- Normal Moisture.  Neck Carotid Arteries- Normal color. Moisture- Normal Moisture. No carotid bruits. No JVD.  Chest and Lung Exam Auscultation: Breath Sounds:-Normal.  Cardiovascular Auscultation:Rythm- Regular. Murmurs & Other Heart Sounds:Auscultation of the heart reveals- No Murmurs.  Abdomen Inspection:-Inspeection Normal. Palpation/Percussion:Note:No mass. Palpation and Percussion of the abdomen reveal- suprapubicTender mild, Non Distended + BS, no rebound or guarding.   Neurologic Cranial Nerve exam:- CN III-XII intact(No nystagmus), symmetric smile. Strength:- 5/5 equal  and symmetric strength both upper and lower extremities.      Assessment & Plan:  For frequent urination and suprapubic pressure will get ua,urine culture and psa. Treat for uti and possible prostatitis pending study results.  Keep urologist appointment coming up in January.   Depending on your studies and how your respond may try to expidite your appointment.  Follow up 10-14  days or as needed.  Mackie Pai, PA-C

## 2020-03-17 LAB — URINE CULTURE
MICRO NUMBER:: 11077339
Result:: NO GROWTH
SPECIMEN QUALITY:: ADEQUATE

## 2020-03-17 LAB — PSA: PSA: 0.6 ng/mL (ref <=4.0)

## 2020-03-19 ENCOUNTER — Other Ambulatory Visit: Payer: Self-pay

## 2020-03-19 MED ORDER — CIPROFLOXACIN HCL 500 MG PO TABS
500.0000 mg | ORAL_TABLET | Freq: Two times a day (BID) | ORAL | 0 refills | Status: DC
Start: 2020-03-19 — End: 2020-10-02

## 2020-03-19 MED ORDER — HYDROCHLOROTHIAZIDE 12.5 MG PO CAPS
12.5000 mg | ORAL_CAPSULE | Freq: Every day | ORAL | 1 refills | Status: DC
Start: 2020-03-19 — End: 2021-07-24

## 2020-04-19 ENCOUNTER — Other Ambulatory Visit: Payer: Self-pay

## 2020-04-19 ENCOUNTER — Ambulatory Visit (INDEPENDENT_AMBULATORY_CARE_PROVIDER_SITE_OTHER): Payer: MEDICARE | Admitting: Medical

## 2020-04-19 VITALS — BP 136/72 | HR 54 | Temp 97.9°F | Resp 16 | Ht 64.0 in | Wt 196.0 lb

## 2020-04-19 DIAGNOSIS — K219 Gastro-esophageal reflux disease without esophagitis: Secondary | ICD-10-CM | POA: Diagnosis not present

## 2020-04-19 DIAGNOSIS — R739 Hyperglycemia, unspecified: Secondary | ICD-10-CM | POA: Diagnosis not present

## 2020-04-19 DIAGNOSIS — R102 Pelvic and perineal pain: Secondary | ICD-10-CM

## 2020-04-19 DIAGNOSIS — Z23 Encounter for immunization: Secondary | ICD-10-CM | POA: Diagnosis not present

## 2020-04-19 DIAGNOSIS — R350 Frequency of micturition: Secondary | ICD-10-CM | POA: Diagnosis not present

## 2020-04-19 LAB — COMPREHENSIVE METABOLIC PANEL WITH GFR
ALT: 15 U/L (ref 0–53)
AST: 17 U/L (ref 0–37)
Albumin: 4.1 g/dL (ref 3.5–5.2)
Alkaline Phosphatase: 64 U/L (ref 39–117)
BUN: 11 mg/dL (ref 6–23)
CO2: 32 meq/L (ref 19–32)
Calcium: 9.6 mg/dL (ref 8.4–10.5)
Chloride: 102 meq/L (ref 96–112)
Creatinine, Ser: 1.04 mg/dL (ref 0.40–1.50)
GFR: 69.8 mL/min
Glucose, Bld: 94 mg/dL (ref 70–99)
Potassium: 4.7 meq/L (ref 3.5–5.1)
Sodium: 138 meq/L (ref 135–145)
Total Bilirubin: 1.5 mg/dL — ABNORMAL HIGH (ref 0.2–1.2)
Total Protein: 6.7 g/dL (ref 6.0–8.3)

## 2020-04-19 LAB — POC URINALSYSI DIPSTICK (AUTOMATED)
Bilirubin, UA: NEGATIVE
Blood, UA: NEGATIVE
Glucose, UA: NEGATIVE
Leukocytes, UA: NEGATIVE
Nitrite, UA: NEGATIVE
Protein, UA: NEGATIVE
Spec Grav, UA: 1.015
Urobilinogen, UA: NEGATIVE U/dL — AB
pH, UA: 6

## 2020-04-19 LAB — PSA: PSA: 0.61 ng/mL (ref 0.10–4.00)

## 2020-04-19 LAB — HEMOGLOBIN A1C: Hgb A1c MFr Bld: 5.7 % (ref 4.6–6.5)

## 2020-04-19 MED ORDER — FAMOTIDINE 40 MG PO TABS: 40.0000 mg | ORAL_TABLET | Freq: Every day | ORAL | 0 refills | Status: DC

## 2020-04-19 MED ORDER — SUCRALFATE 1 G PO TABS: ORAL_TABLET | ORAL | 2 refills | Status: DC

## 2020-04-19 MED ORDER — PANTOPRAZOLE SODIUM 40 MG PO TBEC: 40.0000 mg | DELAYED_RELEASE_TABLET | Freq: Every day | ORAL | 3 refills | Status: DC

## 2020-04-19 NOTE — Progress Notes (Signed)
Subjective:    Patient ID: Brian [name], male    DOB: 1946/03/02, 74 y.o.   MRN: [id number]  HPI   Pt has exact same symptoms as he had last time. No better or worse per pt. See below hpi.  "Pt in with some frequent urination and some suprapubic pressure. Frequent urination more during day. Getting up 3 times at night. Feel like someon standing over suprapubic area. Pt mentions that he had 3 family members who had prostate cancer.   Pt last psa 7 months ago was low/normal.  Pt has appointment with Dr. Nevada Crane urologist in January.  Pt is on flomax."  Pt has htn history. He stopped hctz about 2 weeks ago. He found out recall on hctz. Pharmacy did not tell him to stop. Urine frequency persists despite stopping hctz.  Pt last psa one month ago was normal. Urine culture was negative.   Pt has history of gerd. He is on famotadine and sucraflate. He has belching and acid taste.    Review of Systems  Constitutional: Negative for chills, fatigue and fever.  HENT: Negative for congestion.   Respiratory: Negative for cough, chest tightness, shortness of breath and wheezing.   Cardiovascular: Negative for chest pain and palpitations.  Gastrointestinal: Positive for abdominal pain. Negative for abdominal distention, constipation, diarrhea, nausea and rectal pain.       Reflux reported.  Genitourinary: Positive for frequency. Negative for dysuria and hematuria.  Musculoskeletal: Negative for back pain.  Hematological: Negative for adenopathy. Does not bruise/bleed easily.  Psychiatric/Behavioral: Negative for confusion, sleep disturbance and suicidal ideas.   Past Medical History:  Diagnosis Date  . Atypical chest pain     Negative cardiac catheterization 2014  . Barrett's esophagus   . 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 Blind right eye   . 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 Chicken pox   . Chronic back pain   . Dislocation, jaw 1960s  . Edema    Left Leg    . Essential hypertension, benign   . GERD (gastroesophageal reflux disease)   . [place] measles   . Heart attack ([place])   . Hyperlipidemia   . Measles   . Mumps   . Osteopenia 11.12.10   bone density test  . Personal history of other diseases of digestive system 08.10.12   Gastric ulcer     Social History   Socioeconomic History  . Marital status: Single    Spouse name: Not on file  . Number of children: 0  . Years of education: Not on file  . Highest education level: Not on file  Occupational History  . Not on file  Tobacco Use  . Smoking status: Former Smoker    Packs/day: 0.25    Years: 2.00    Pack years: 0.50    Types: Cigarettes    Quit date: 06/02/1966    Years since quitting: 53.9  . Smokeless tobacco: Never Used  Vaping Use  . Vaping Use: Never used  Substance and Sexual Activity  . Alcohol use: No    Alcohol/week: 0.0 standard drinks  . Drug use: No  . Sexual activity: Not on file  Other Topics Concern  . Not on file  Social History Narrative  . Not on file   Social Determinants of Health   Financial Resource Strain: Low Risk   . Difficulty of Paying Living Expenses: Not hard at all  Food Insecurity: No Food Insecurity  . Worried About Running  Out of Food in the Last Year: Never true  . Ran Out of Food in the Last Year: Never true  Transportation Needs: No Transportation Needs  . Lack of Transportation (Medical): No  . Lack of Transportation (Non-Medical): No  Physical Activity:   . Days of Exercise per Week: Not on file  . Minutes of Exercise per Session: Not on file  Stress:   . Feeling of Stress : Not on file  Social Connections:   . Frequency of Communication with Friends and Family: Not on file  . Frequency of Social Gatherings with Friends and Family: Not on file  . Attends Religious Services: Not on file  . Active Member of Clubs or Organizations: Not on file  . Attends Archivist Meetings: Not on file  . Marital Status: Not on  file  Intimate Partner Violence:   . Fear of Current or Ex-Partner: Not on file  . Emotionally Abused: Not on file  . Physically Abused: Not on file  . Sexually Abused: Not on file    Past Surgical History:  Procedure Laterality Date  . ABDOMINAL HERNIA REPAIR  1972  . APPENDECTOMY  1973  . BACK SURGERY  10/28/2017  . CATARACT EXTRACTION, BILATERAL  2005  . CHOLECYSTECTOMY  08/2016  . COLONOSCOPY WITH ESOPHAGOGASTRODUODENOSCOPY (EGD)     about 2016. [facility]  . ESOPHAGOGASTRODUODENOSCOPY  06.2011  . EYE SURGERY  1972   Bilateral Lens Replacement  . FACIAL RECONSTRUCTION SURGERY  2008  . LEFT HEART CATH  02.17.2014  . [place], 2008   Dislocated Jaw  . PARS PLANA VITRECTOMY W/ REPAIR OF MACULAR HOLE  2005   macular hole repair, right eye  . PARS PLANA VITRECTOMY W/ REPAIR OF MACULAR HOLE  2010   mechanical, left eye  . PROSTATE BIOPSY  [place] 2017   w/ Dr. [place] Crane  . SPINAL CORD STIMULATOR IMPLANT    . WISDOM TOOTH EXTRACTION      Family History  Problem Relation Age of Onset  . Alzheimer's disease Mother        Deceased  . Diabetes Maternal Grandmother   . Heart attack Maternal Grandmother   . Prostate cancer Maternal Uncle   . Diabetes Maternal Uncle   . Healthy Son   . Cataracts Maternal Aunt   . Colon cancer Neg Hx   . Esophageal cancer Neg Hx     Allergies  Allergen Reactions  . Gabapentin Other (See Comments)    Feel "weird"; Nausea; Weakness; Syncope    Current Outpatient Medications on File Prior to Visit  Medication Sig Dispense Refill  . aspirin (ASPIR-81) 81 MG EC tablet Take 1 tablet by mouth daily.    Marland Kitchen b complex vitamins tablet Take 1 tablet by mouth daily.    . Calcium Carbonate-Vitamin D (CALCIUM-VITAMIN D3 PO) Take by mouth daily.    . ciprofloxacin (CIPRO) 500 MG tablet Take 1 tablet (500 mg total) by mouth 2 (two) times daily. 20 tablet 0  . cyclobenzaprine (FLEXERIL) 5 MG tablet 1 tab po once daily max prn neck pain  with ha 5 tablet 0  . hydrochlorothiazide (MICROZIDE) 12.5 MG capsule Take 1 capsule (12.5 mg total) by mouth daily. 90 capsule 1  . meclizine (ANTIVERT) 12.5 MG tablet Take 1 tablet (12.5 mg total) by mouth 3 (three) times daily as needed for dizziness. 21 tablet 0  . rosuvastatin (CRESTOR) 20 MG tablet Take 1 tablet (20 mg total) by mouth daily. [place]  tablet 3  . sertraline (ZOLOFT) 25 MG tablet Take 1 tablet (25 mg total) by mouth daily. 30 tablet 3  . tamsulosin (FLOMAX) 0.4 MG CAPS capsule Take 2 capsules (0.8 mg total) by mouth daily. 90 capsule 1  . timolol (TIMOPTIC) 0.5 % ophthalmic solution Place 1 drop into the left eye daily. 10 mL 12  . traMADol (ULTRAM) 50 MG tablet 1 tab po q 8 hours prn pain 15 tablet 0   No current facility-administered medications on file prior to visit.    BP 136/72 (BP Location: Left Arm, Patient Position: Sitting, Cuff Size: Small)   Pulse (!) 54   Temp 97.9 F (36.6 C) (Oral)   Resp 16   Ht 5\' 4"  (1.626 m)   Wt 196 lb (88.9 kg)   SpO2 98%   BMI 33.64 kg/m       Objective:   Physical Exam  General- No acute distress. Pleasant patient. Neck- Full range of motion, no jvd Lungs- Clear, even and unlabored. Heart- regular rate and rhythm. Neurologic- CNII- XII grossly intact. Back-no cva tenderness.     Assessment & Plan:  I do think your frequent urination/urinary issues are probably BPH related.  Continue Flomax.  Go ahead and check A1c since you do have history of elevated sugar.  Repeat PSA and get urine culture.  Do recommend that you potentially try to call urologist office and get your appointment bumped up/earlier.  I did talk with pharmacist and she did not recommend combination of doxazosin and Flomax.  We will do some further research while he wait during the interim.  If your symptoms worsen let me know.  For history of GERD with persisting symptoms despite treatment combination of famotidine and sucralfate will add Protonix.  Went  ahead and referred you to GI for further evaluation.  Your blood pressure is well controlled presently.  I do not recommend restarting HCTZ presently.  But do check your blood pressure occasionally and if you notice BP is 140/90 or higher then let me know.  Follow-up in 6 weeks or as needed.  Mackie Pai, PA-C   Time spent with patient today was 40   minutes which consisted of chart rediew, discussing diagnoses, work up, treatment, answering question, placing referral, discussing med options with pharmacist. and documentation.

## 2020-04-19 NOTE — Patient Instructions (Signed)
I do think your frequent urination/urinary issues are probably BPH related.  Continue Flomax.  Go ahead and check A1c since you do have history of elevated sugar.  Repeat PSA and get urine culture.  Do recommend that you potentially try to call urologist office and get your appointment bumped up/earlier.  I did talk with pharmacist and she did not recommend combination of doxazosin and Flomax.  We will do some further research while he wait during the interim.  If your symptoms worsen let me know.  For history of GERD with persisting symptoms despite treatment combination of famotidine and sucralfate will add Protonix.  Went ahead and referred you to GI for further evaluation.  Your blood pressure is well controlled presently.  I do not recommend restarting HCTZ presently.  But do check your blood pressure occasionally and if you notice BP is 140/90 or higher then let me know.  Follow-up in 6 weeks or as needed.

## 2020-04-20 LAB — URINE CULTURE
MICRO NUMBER:: 11220818
Result:: NO GROWTH
SPECIMEN QUALITY:: ADEQUATE

## 2020-05-14 ENCOUNTER — Other Ambulatory Visit (HOSPITAL_BASED_OUTPATIENT_CLINIC_OR_DEPARTMENT_OTHER): Payer: Self-pay | Admitting: Internal Medicine

## 2020-05-14 MED FILL — diazePAM 5 MG TABS: 5 | 2 days supply | Qty: 2 | Fill #0

## 2020-05-18 ENCOUNTER — Ambulatory Visit: Payer: MEDICARE | Attending: Internal Medicine

## 2020-05-18 DIAGNOSIS — Z23 Encounter for immunization: Secondary | ICD-10-CM

## 2020-05-18 NOTE — Progress Notes (Signed)
° °  Covid-19 Vaccination Clinic  Name:  Brian [name]    MRN: [id number] DOB: 10/11/1945  05/18/2020  [name] was observed post Covid-19 immunization for 15 minutes without incident. He was provided with Vaccine Information Sheet and instruction to access the V-Safe system.   [name] was instructed to call 911 with any severe reactions post vaccine:  Difficulty breathing   Swelling of face and throat   A fast heartbeat   A bad rash all over body   Dizziness and weakness   Immunizations Administered    Name Date Dose VIS Date Route   Moderna Covid-19 Booster Vaccine 05/18/2020  9:04 AM 0.25 mL 03/21/2020 Intramuscular   Manufacturer: Moderna   Lot: [id number]   [place]: [id number]

## 2020-05-21 MED FILL — MODERNA COVID-19 VACCINE 10: 100 | 1 days supply | Qty: 0 | Fill #0

## 2020-06-20 ENCOUNTER — Telehealth: Payer: Self-pay

## 2020-06-20 ENCOUNTER — Other Ambulatory Visit: Payer: Self-pay

## 2020-06-20 NOTE — Telephone Encounter (Signed)
Appt scheduled 06/21/20.

## 2020-06-20 NOTE — Telephone Encounter (Signed)
Caller is wanting to make an appointment for tomorrow.  Telephone: [phone]

## 2020-06-21 ENCOUNTER — Ambulatory Visit (HOSPITAL_BASED_OUTPATIENT_CLINIC_OR_DEPARTMENT_OTHER)
Admission: RE | Admit: 2020-06-21 | Discharge: 2020-06-21 | Disposition: A | Discharge status: Home / Self Care | Payer: MEDICARE | Source: Ambulatory Visit | Attending: Medical | Admitting: Medical

## 2020-06-21 ENCOUNTER — Ambulatory Visit (INDEPENDENT_AMBULATORY_CARE_PROVIDER_SITE_OTHER): Payer: MEDICARE | Admitting: Medical

## 2020-06-21 VITALS — BP 125/60 | HR 70 | Resp 18 | Ht 64.0 in | Wt 192.4 lb

## 2020-06-21 DIAGNOSIS — K760 Fatty (change of) liver, not elsewhere classified: Secondary | ICD-10-CM

## 2020-06-21 DIAGNOSIS — R079 Chest pain, unspecified: Secondary | ICD-10-CM | POA: Diagnosis not present

## 2020-06-21 DIAGNOSIS — R739 Hyperglycemia, unspecified: Secondary | ICD-10-CM

## 2020-06-21 DIAGNOSIS — E785 Hyperlipidemia, unspecified: Secondary | ICD-10-CM

## 2020-06-21 DIAGNOSIS — M545 Low back pain, unspecified: Secondary | ICD-10-CM | POA: Insufficient documentation

## 2020-06-21 DIAGNOSIS — I10 Essential (primary) hypertension: Secondary | ICD-10-CM | POA: Diagnosis not present

## 2020-06-21 LAB — LIPID PANEL
Cholesterol: 176 mg/dL (ref 0–200)
HDL: 54 mg/dL (ref >39.00)
LDL Cholesterol: 104 mg/dL — ABNORMAL HIGH (ref 0–99)
NonHDL: 122.32
Total CHOL/HDL Ratio: 3
Triglycerides: 94 mg/dL (ref 0.0–149.0)
VLDL: 18.8 mg/dL (ref 0.0–40.0)

## 2020-06-21 LAB — TROPONIN I (HIGH SENSITIVITY): High Sens Troponin I: 4 ng/L (ref 2–17)

## 2020-06-21 NOTE — Progress Notes (Signed)
Subjective:    Patient ID: Brian [name], male    DOB: 02-21-1946, 75 y.o.   MRN: [id number]  HPI  Pt in for evaluation after fall x 2 on Monday shoveling ice/snow. Pt had states about 14 days he had lumbar spine procedure. He states procedure to treat nerve pain. Hx of back surgery as well. Pt states back is not hurting he just wanted to make sure nothing happened during the fall. Looked for notes in care everywhere and did not find. Pt not on any pain medication or muscle relaxant.  Pt states last couple of days after fall he was having some left side chest pain that was coming and going. He states pain was like a transient sharp stab sensation for a second or two. Happened about 6 times. Last time happened was day before yesterday. He states most of time this occurred when he was sitting. No associated with acitivity. No associated cardiac type signs/symptoms.   Review of Systems  Constitutional: Negative for chills, fatigue and fever.  HENT: Negative for congestion and drooling.   Respiratory: Negative for cough, chest tightness, shortness of breath and wheezing.   Cardiovascular: Negative for chest pain and palpitations.       See hpi. Non presently.  Gastrointestinal: Negative for abdominal pain, constipation, nausea and vomiting.  Musculoskeletal: Negative for back pain, joint swelling and myalgias.  Skin: Negative for rash.  Neurological: Negative for dizziness, speech difficulty, weakness, numbness and headaches.  Hematological: Negative for adenopathy. Does not bruise/bleed easily.  Psychiatric/Behavioral: Negative for behavioral problems, confusion and suicidal ideas. The patient is not nervous/anxious.    Past Medical History:  Diagnosis Date  . Atypical chest pain     Negative cardiac catheterization 2014  . Barrett's esophagus   . 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 Blind right eye   . 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 Chicken pox   . Chronic  back pain   . Dislocation, jaw 1960s  . Edema    Left Leg  . Essential hypertension, benign   . GERD (gastroesophageal reflux disease)   . [place] measles   . Heart attack ([place])   . Hyperlipidemia   . Measles   . Mumps   . Osteopenia 11.12.10   bone density test  . Personal history of other diseases of digestive system 08.10.12   Gastric ulcer     Social History   Socioeconomic History  . Marital status: Single    Spouse name: Not on file  . Number of children: 0  . Years of education: Not on file  . Highest education level: Not on file  Occupational History  . Not on file  Tobacco Use  . Smoking status: Former Smoker    Packs/day: 0.25    Years: 2.00    Pack years: 0.50    Types: Cigarettes    Quit date: 06/02/1966    Years since quitting: 54.0  . Smokeless tobacco: Never Used  Vaping Use  . Vaping Use: Never used  Substance and Sexual Activity  . Alcohol use: No    Alcohol/week: 0.0 standard drinks  . Drug use: No  . Sexual activity: Not on file  Other Topics Concern  . Not on file  Social History Narrative  . Not on file   Social Determinants of Health   Financial Resource Strain: Low Risk   . Difficulty of Paying Living Expenses: Not hard at all  Food Insecurity: No Food Insecurity  .  Worried About Charity fundraiser in the Last Year: Never true  . Ran Out of Food in the Last Year: Never true  Transportation Needs: No Transportation Needs  . Lack of Transportation (Medical): No  . Lack of Transportation (Non-Medical): No  Physical Activity: Not on file  Stress: Not on file  Social Connections: Not on file  Intimate Partner Violence: Not on file    Past Surgical History:  Procedure Laterality Date  . ABDOMINAL HERNIA REPAIR  1972  . APPENDECTOMY  1973  . BACK SURGERY  10/28/2017  . CATARACT EXTRACTION, BILATERAL  2005  . CHOLECYSTECTOMY  08/2016  . COLONOSCOPY WITH ESOPHAGOGASTRODUODENOSCOPY (EGD)     about 2016. [facility]  .  ESOPHAGOGASTRODUODENOSCOPY  06.2011  . EYE SURGERY  1972   Bilateral Lens Replacement  . FACIAL RECONSTRUCTION SURGERY  2008  . LEFT HEART CATH  02.17.2014  . [place], 2008   Dislocated Jaw  . PARS PLANA VITRECTOMY W/ REPAIR OF MACULAR HOLE  2005   macular hole repair, right eye  . PARS PLANA VITRECTOMY W/ REPAIR OF MACULAR HOLE  2010   mechanical, left eye  . PROSTATE BIOPSY  [place] 2017   w/ Dr. Nevada Crane  . SPINAL CORD STIMULATOR IMPLANT    . WISDOM TOOTH EXTRACTION      Family History  Problem Relation Age of Onset  . Alzheimer's disease Mother        Deceased  . Diabetes Maternal Grandmother   . Heart attack Maternal Grandmother   . Prostate cancer Maternal Uncle   . Diabetes Maternal Uncle   . Healthy Son   . Cataracts Maternal Aunt   . Colon cancer Neg Hx   . Esophageal cancer Neg Hx     Allergies  Allergen Reactions  . Gabapentin Other (See Comments)    Feel "weird"; Nausea; Weakness; Syncope    Current Outpatient Medications on File Prior to Visit  Medication Sig Dispense Refill  . aspirin 81 MG EC tablet Take 1 tablet by mouth daily.    Marland Kitchen b complex vitamins tablet Take 1 tablet by mouth daily.    . Calcium Carbonate-Vitamin D (CALCIUM-VITAMIN D3 PO) Take by mouth daily.    . cyclobenzaprine (FLEXERIL) 5 MG tablet 1 tab po once daily max prn neck pain with ha 5 tablet 0  . famotidine (PEPCID) 40 MG tablet Take 1 tablet (40 mg total) by mouth daily. 90 tablet 0  . hydrochlorothiazide (MICROZIDE) 12.5 MG capsule Take 1 capsule (12.5 mg total) by mouth daily. 90 capsule 1  . meclizine (ANTIVERT) 12.5 MG tablet Take 1 tablet (12.5 mg total) by mouth 3 (three) times daily as needed for dizziness. 21 tablet 0  . pantoprazole (PROTONIX) 40 MG tablet Take 1 tablet (40 mg total) by mouth daily. 30 tablet 3  . rosuvastatin (CRESTOR) 20 MG tablet Take 1 tablet (20 mg total) by mouth daily. 90 tablet 3  . sertraline (ZOLOFT) 25 MG tablet Take 1 tablet (25 mg  total) by mouth daily. 30 tablet 3  . sucralfate (CARAFATE) 1 g tablet TAKE 1 TABLET (1 GRAM TOTAL) BY MOUTH 4 (FOUR) TIMES DAILY -  WITH MEALS AND AT BEDTIME. 360 tablet 2  . tamsulosin (FLOMAX) 0.4 MG CAPS capsule Take 2 capsules (0.8 mg total) by mouth daily. 90 capsule 1  . timolol (TIMOPTIC) 0.5 % ophthalmic solution Place 1 drop into the left eye daily. 10 mL 12  . traMADol (ULTRAM) 50 MG  tablet 1 tab po q 8 hours prn pain 15 tablet 0  . ciprofloxacin (CIPRO) 500 MG tablet Take 1 tablet (500 mg total) by mouth 2 (two) times daily. (Patient not taking: Reported on 06/21/2020) 20 tablet 0   No current facility-administered medications on file prior to visit.    BP 125/60   Pulse 70   Resp 18   Ht 5\' 4"  (1.626 m)   Wt 192 lb 6.4 oz (87.3 kg)   SpO2 98%   BMI 33.03 kg/m      Objective:   Physical Exam  General Appearance- Not in acute distress.    Chest and Lung Exam Auscultation: Breath sounds:-Normal. Clear even and unlabored. Adventitious sounds:- No Adventitious sounds.  Cardiovascular Auscultation:Rythm - Regular, rate and rythm. Heart Sounds -Normal heart sounds.  Anterior thorax- mild left side costochondral tenderness to palpation.  Abdomen Inspection:-Inspection Normal.  Palpation/Perucssion: Palpation and Percussion of the abdomen reveal- Non Tender, No Rebound tenderness, No rigidity(Guarding) and No Palpable abdominal masses.  Liver:-Normal.  Spleen:- Normal.   Back Mid lumbar spine tenderness to palpation.(soreness) No pain on straight leg lift. Pain on lateral movements and flexion/extension of the spine.  Lower ext neurologic  L5-S1 sensation intact bilaterally. Normal patellar reflexes bilaterally. No foot drop bilaterally.      Assessment & Plan:  History of recent random transient sharp left-sided chest pain earlier in the week.  Last time pain present day before yesterday.  No pain presently on exam.  EKG showed sinus rhythm.  No acute  ischemia seen.  From cardiac risk factors so we will get 1 set of stat troponin.  Since no pain present for 2days second set not necessary presently.  If you have recurrent transient intermittent pain let me know.  In that event would refer to cardiologist for further evaluation.  If you have pain that returns and is constant as discussed then have to recommend ED evaluation.  History of elevated sugar but last A1c 2 months ago was not in the diabetic range.  Continue to eat low sugar diet.  Hypertension history.  Blood pressure well controlled today.  Continue HCTZ.  History of high cholesterol and in light of recent chest pain we will repeat lipid panel with metabolic panel.  Intermittent occasional dull right upper quadrant region pain.  Discussed prior ultrasound of the abdomen and you do have fatty liver.  Advised avoid any alcohol use, fried/fatty foods, excess use of Tylenol and avoid fructose in diet.  If pain worsens or changes let me know.  Lumbar back pain after a fall.  However no obvious pain on exam presently except for describe soreness.  Pain in lower back/chronic in nature but much improved after procedure done by specialist more than a week ago.  You expressed concern for injury so we will go ahead and get a lumbar spine x-ray.  Would recommend following specialist advice and not taking any pain medication or Flexeril presently.  If pain worsens let us know.  Follow-up date to be determined after lab review.

## 2020-06-21 NOTE — Patient Instructions (Addendum)
History of recent random transient sharp left-sided chest pain earlier in the week.  Last time pain present day before yesterday.  No pain presently on exam.  EKG showed sinus rhythm.  No acute ischemia seen.  From cardiac risk factors so we will get 1 set of stat troponin.  Since no pain present for 2days second set not necessary presently.  If you have recurrent transient intermittent pain let me know.  In that event would refer to cardiologist for further evaluation.  If you have pain that returns and is constant as discussed then have to recommend ED evaluation.  ekg nsr. No acute ischemia.   Chest wall tender at costochondral junction. So likley advise low dose ibuprofen after lab review.  History of elevated sugar but last A1c 2 months ago was not in the diabetic range.  Continue to eat low sugar diet.  Hypertension history.  Blood pressure well controlled today.  Continue HCTZ.  History of high cholesterol and in light of recent chest pain we will repeat lipid panel with metabolic panel.  Intermittent occasional dull right upper quadrant region pain.  Discussed prior ultrasound of the abdomen and you do have fatty liver.  Advised avoid any alcohol use, fried/fatty foods, excess use of Tylenol and avoid fructose in diet.  If pain worsens or changes let me know.  Lumbar back pain after a fall.  However no obvious pain on exam presently except for describe soreness.  Pain in lower back/chronic in nature but much improved after procedure done by specialist more than a week ago.  You expressed concern for injury so we will go ahead and get a lumbar spine x-ray.  Would recommend following specialist advice and not taking any pain medication or Flexeril presently.  If pain worsens let us know.  Follow-up date to be determined after lab review.

## 2020-07-19 ENCOUNTER — Other Ambulatory Visit: Payer: Self-pay | Admitting: Medical

## 2020-08-22 ENCOUNTER — Other Ambulatory Visit (HOSPITAL_BASED_OUTPATIENT_CLINIC_OR_DEPARTMENT_OTHER): Payer: Self-pay | Admitting: Ophthalmology

## 2020-08-22 MED FILL — OLOPATADINE HCL 0.2 % SOLN: 0.2 | 50 days supply | Qty: 5 | Fill #0

## 2020-09-10 ENCOUNTER — Other Ambulatory Visit: Payer: Self-pay | Admitting: Medical

## 2020-09-24 ENCOUNTER — Ambulatory Visit: Payer: MEDICARE | Admitting: *Deleted

## 2020-09-27 ENCOUNTER — Ambulatory Visit: Payer: MEDICARE

## 2020-10-02 ENCOUNTER — Ambulatory Visit (INDEPENDENT_AMBULATORY_CARE_PROVIDER_SITE_OTHER): Payer: MEDICARE | Admitting: Medical

## 2020-10-02 ENCOUNTER — Other Ambulatory Visit (HOSPITAL_BASED_OUTPATIENT_CLINIC_OR_DEPARTMENT_OTHER): Payer: Self-pay

## 2020-10-02 ENCOUNTER — Other Ambulatory Visit: Payer: Self-pay

## 2020-10-02 DIAGNOSIS — K219 Gastro-esophageal reflux disease without esophagitis: Secondary | ICD-10-CM | POA: Diagnosis not present

## 2020-10-02 DIAGNOSIS — Z1211 Encounter for screening for malignant neoplasm of colon: Secondary | ICD-10-CM

## 2020-10-02 DIAGNOSIS — R1032 Left lower quadrant pain: Secondary | ICD-10-CM | POA: Diagnosis not present

## 2020-10-02 LAB — COMPREHENSIVE METABOLIC PANEL
ALT: 17 U/L (ref 0–53)
AST: 21 U/L (ref 0–37)
Albumin: 4 g/dL (ref 3.5–5.2)
Alkaline Phosphatase: 61 U/L (ref 39–117)
BUN: 10 mg/dL (ref 6–23)
CO2: 30 mEq/L (ref 19–32)
Calcium: 9.5 mg/dL (ref 8.4–10.5)
Chloride: 102 mEq/L (ref 96–112)
Creatinine, Ser: 1.16 mg/dL (ref 0.40–1.50)
GFR: 61.04 mL/min (ref >60.00)
Glucose, Bld: 100 mg/dL — ABNORMAL HIGH (ref 70–99)
Potassium: 4.1 mEq/L (ref 3.5–5.1)
Sodium: 138 mEq/L (ref 135–145)
Total Bilirubin: 2.3 mg/dL — ABNORMAL HIGH (ref 0.2–1.2)
Total Protein: 6.5 g/dL (ref 6.0–8.3)

## 2020-10-02 LAB — CBC WITH DIFFERENTIAL/PLATELET
Basophils Absolute: 0 10*3/uL (ref 0.0–0.1)
Basophils Relative: 1.1 % (ref 0.0–3.0)
Eosinophils Absolute: 0.1 10*3/uL (ref 0.0–0.7)
Eosinophils Relative: 3.2 % (ref 0.0–5.0)
HCT: 42 % (ref 39.0–52.0)
Hemoglobin: 13.7 g/dL (ref 13.0–17.0)
Lymphocytes Relative: 40.1 % (ref 12.0–46.0)
Lymphs Abs: 1.7 10*3/uL (ref 0.7–4.0)
MCHC: 32.6 g/dL (ref 30.0–36.0)
MCV: 86.6 fl (ref 78.0–100.0)
Monocytes Absolute: 0.6 10*3/uL (ref 0.1–1.0)
Monocytes Relative: 15.5 % — ABNORMAL HIGH (ref 3.0–12.0)
Neutro Abs: 1.7 10*3/uL (ref 1.4–7.7)
Neutrophils Relative %: 40.1 % — ABNORMAL LOW (ref 43.0–77.0)
Platelets: 265 10*3/uL (ref 150.0–400.0)
RBC: 4.85 Mil/uL (ref 4.22–5.81)
RDW: 13.4 % (ref 11.5–15.5)
WBC: 4.1 10*3/uL (ref 4.0–10.5)

## 2020-10-02 MED ORDER — ALUM & MAG HYDROXIDE-SIMETH 200-200-20 MG/5ML PO SUSP: 30.0000 mL | Freq: Once | ORAL | Status: AC

## 2020-10-02 MED ORDER — HYOSCYAMINE SULFATE 0.125 MG SL SUBL: 0.1250 mg | SUBLINGUAL_TABLET | Freq: Once | SUBLINGUAL | Status: AC

## 2020-10-02 MED ORDER — CIPROFLOXACIN HCL 500 MG PO TABS
500.0000 mg | ORAL_TABLET | Freq: Two times a day (BID) | ORAL | 0 refills | Status: AC
  Filled 2020-10-02: qty 14, 7d supply, fill #0

## 2020-10-02 MED ORDER — METRONIDAZOLE 500 MG PO TABS
500.0000 mg | ORAL_TABLET | Freq: Three times a day (TID) | ORAL | 0 refills | Status: AC
  Filled 2020-10-02: qty 21, 7d supply, fill #0

## 2020-10-02 MED ORDER — LIDOCAINE VISCOUS HCL 2 % MT SOLN: 15.0000 mL | Freq: Once | OROMUCOSAL | Status: AC

## 2020-10-02 NOTE — Progress Notes (Signed)
Subjective:    Patient ID: Brian [name], male    DOB: 1946/02/03, 75 y.o.   MRN: [id number]  HPI  Pt in states he is having some severe reflux over past 2 weeks.   Has epigastric pain mostly after he eats and can feel reflux/burning up to neck area. Not belching except when he drinks hot beverages.   Last year some gerd symptoms early summer. Pt has been on famotadine, carafate and protonix. He still has epigastric pain after eating. Pt has tried to cut out sugar. Also trying to cut back on salt. Avoiding fried foods.I had placed referral to GI . Appointment not made?   Pt also has some mild llq pain over past 2 weeks. Softer stools but no diarrhea. No fever, no chills or sweats.       Review of Systems  Constitutional: Negative for chills, fatigue and fever.  Respiratory: Negative for cough, chest tightness, shortness of breath and wheezing.   Cardiovascular: Negative for chest pain and palpitations.  Gastrointestinal: Positive for abdominal pain. Negative for blood in stool, constipation, nausea and vomiting.  Genitourinary: Negative for flank pain.  Musculoskeletal: Negative for back pain and gait problem.  Skin: Negative for rash.  Neurological: Negative for dizziness, speech difficulty, numbness and headaches.  Hematological: Negative for adenopathy. Does not bruise/bleed easily.  Psychiatric/Behavioral: Negative for behavioral problems and confusion.    Past Medical History:  Diagnosis Date  . Atypical chest pain     Negative cardiac catheterization 2014  . Barrett's esophagus   . 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 Blind right eye   . 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 Chicken pox   . Chronic back pain   . Dislocation, jaw 1960s  . Edema    Left Leg  . Essential hypertension, benign   . GERD (gastroesophageal reflux disease)   . [place] measles   . Heart attack ([place])   . Hyperlipidemia   . Measles   . Mumps   . Osteopenia  11.12.10   bone density test  . Personal history of other diseases of digestive system 08.10.12   Gastric ulcer     Social History   Socioeconomic History  . Marital status: Single    Spouse name: Not on file  . Number of children: 0  . Years of education: Not on file  . Highest education level: Not on file  Occupational History  . Not on file  Tobacco Use  . Smoking status: Former Smoker    Packs/day: 0.25    Years: 2.00    Pack years: 0.50    Types: Cigarettes    Quit date: 06/02/1966    Years since quitting: 54.3  . Smokeless tobacco: Never Used  Vaping Use  . Vaping Use: Never used  Substance and Sexual Activity  . Alcohol use: No    Alcohol/week: 0.0 standard drinks  . Drug use: No  . Sexual activity: Not on file  Other Topics Concern  . Not on file  Social History Narrative  . Not on file   Social Determinants of Health   Financial Resource Strain: Not on file  Food Insecurity: Not on file  Transportation Needs: Not on file  Physical Activity: Not on file  Stress: Not on file  Social Connections: Not on file  Intimate Partner Violence: Not on file    Past Surgical History:  Procedure Laterality Date  . ABDOMINAL HERNIA REPAIR  1972  . APPENDECTOMY  1973  .  BACK SURGERY  10/28/2017  . CATARACT EXTRACTION, BILATERAL  2005  . CHOLECYSTECTOMY  08/2016  . COLONOSCOPY WITH ESOPHAGOGASTRODUODENOSCOPY (EGD)     about 2016. [place] [facility]  . ESOPHAGOGASTRODUODENOSCOPY  06.2011  . EYE SURGERY  1972   Bilateral Lens Replacement  . FACIAL RECONSTRUCTION SURGERY  2008  . LEFT HEART CATH  02.17.2014  . [place], 2008   Dislocated Jaw  . PARS PLANA VITRECTOMY W/ REPAIR OF MACULAR HOLE  2005   macular hole repair, right eye  . PARS PLANA VITRECTOMY W/ REPAIR OF MACULAR HOLE  2010   mechanical, left eye  . PROSTATE BIOPSY  [place] 2017   w/ Dr. [place] Crane  . SPINAL CORD STIMULATOR IMPLANT    . WISDOM TOOTH EXTRACTION      Family History  Problem  Relation Age of Onset  . Alzheimer's disease Mother        Deceased  . Diabetes Maternal Grandmother   . Heart attack Maternal Grandmother   . Prostate cancer Maternal Uncle   . Diabetes Maternal Uncle   . Healthy Son   . Cataracts Maternal Aunt   . Colon cancer Neg Hx   . Esophageal cancer Neg Hx     Allergies  Allergen Reactions  . Gabapentin Other (See Comments)    Feel "weird"; Nausea; Weakness; Syncope    Current Outpatient Medications on File Prior to Visit  Medication Sig Dispense Refill  . aspirin 81 MG EC tablet Take 1 tablet by mouth daily.    Marland Kitchen b complex vitamins tablet Take 1 tablet by mouth daily.    . Calcium Carbonate-Vitamin D (CALCIUM-VITAMIN D3 PO) Take by mouth daily.    Marland Kitchen COVID-19 mRNA vaccine, Moderna, 100 MCG/0.5ML injection INJECT AS DIRECTED .25 mL 0  . cyclobenzaprine (FLEXERIL) 5 MG tablet 1 tab po once daily max prn neck pain with ha 5 tablet 0  . diazepam (VALIUM) 5 MG tablet TAKE 1 TABLET BY MOUTH 30 MIN PRIOR TO PROCEDURE 2 tablet 0  . famotidine (PEPCID) 40 MG tablet TAKE 1 TABLET EVERY DAY 90 tablet 0  . hydrochlorothiazide (MICROZIDE) 12.5 MG capsule Take 1 capsule (12.5 mg total) by mouth daily. 90 capsule 1  . meclizine (ANTIVERT) 12.5 MG tablet Take 1 tablet (12.5 mg total) by mouth 3 (three) times daily as needed for dizziness. 21 tablet 0  . Olopatadine HCl 0.2 % SOLN PLACE 1 DROP INTO BOTH EYES DAILY. 5 mL 0  . pantoprazole (PROTONIX) 40 MG tablet TAKE 1 TABLET EVERY DAY 90 tablet 0  . rosuvastatin (CRESTOR) 20 MG tablet TAKE 1 TABLET (20 MG TOTAL) BY MOUTH DAILY. 90 tablet 3  . sertraline (ZOLOFT) 25 MG tablet Take 1 tablet (25 mg total) by mouth daily. 30 tablet 3  . sucralfate (CARAFATE) 1 g tablet TAKE 1 TABLET (1 GRAM TOTAL) BY MOUTH 4 (FOUR) TIMES DAILY -  WITH MEALS AND AT BEDTIME. 360 tablet 2  . tamsulosin (FLOMAX) 0.4 MG CAPS capsule Take 2 capsules (0.8 mg total) by mouth daily. 90 capsule 1  . timolol (TIMOPTIC) 0.5 % ophthalmic  solution Place 1 drop into the left eye daily. 10 mL 12  . traMADol (ULTRAM) 50 MG tablet 1 tab po q 8 hours prn pain 15 tablet 0   No current facility-administered medications on file prior to visit.    BP 132/67   Pulse (!) 53   Resp 18   Ht 5\' 4"  (1.626 m)   Wt 189  lb 6.4 oz (85.9 kg)   SpO2 98%   BMI 32.51 kg/m      Objective:   Physical Exam  General- No acute distress. Pleasant patient. Neck- Full range of motion, no jvd Lungs- Clear, even and unlabored. Heart- regular rate and rhythm. Neurologic- CNII- XII grossly intact.       Assessment & Plan:  History of GERD type symptoms/severe over the years.  Worse recently despite use of famotidine, Carafate and Protonix.  Attempted referral last year but that referral to GI did not occur/go through.  On review of prior studies in epic did see that you had EGD in 2017.  On that study says repeat in 3 years.  Some going to refer you back to [facility].  Hopefully they will get you in soon.  Today we gave you a GI cocktail to help relieve symptoms.  Recommend strict healthy/GERD diet.  Continue on current 3 medication regimen.  You also note 3 weeks of some intermittent low level left lower quadrant pain with loose stools.  On review of prior CT scan in 2019 did not note any diverticulosis in that area.  However it is possible that she could have developed diverticulosis during the interim and possible diverticulitis presently.  Though symptoms not severe presently.  Decided to go ahead and prescribe Cipro and Flagyl antibiotic for 7 days.  If your symptoms change or worsen despite use of antibiotics then notify me and we can get CT abdomen and pelvis with contrast to assess further.  If any severe changing signs or symptoms recommend ED evaluation.  We will get CBC and metabolic panel today.  Follow-up and 7 to 10 days or as needed.  Time spent with patient today was 40   minutes which consisted of chart review, discussing  diagnoses, work up, treatment and documentation.

## 2020-10-02 NOTE — Patient Instructions (Signed)
History of GERD type symptoms/severe over the years.  Worse recently despite use of famotidine, Carafate and Protonix.  Attempted referral last year but that referral to GI did not occur/go through.  On review of prior studies in epic did see that you had EGD in 2017.  On that study says repeat in 3 years.  Some going to refer you back to [facility].  Hopefully they will get you in soon.  Today we gave you a GI cocktail to help relieve symptoms.  Recommend strict healthy/GERD diet.  Continue on current 3 medication regimen.  You also note 3 weeks of some intermittent low level left lower quadrant pain with loose stools.  On review of prior CT scan in 2019 did not note any diverticulosis in that area.  However it is possible that she could have developed diverticulosis during the interim and possible diverticulitis presently.  Though symptoms not severe presently.  Decided to go ahead and prescribe Cipro and Flagyl antibiotic for 7 days.  If your symptoms change or worsen despite use of antibiotics then notify me and we can get CT abdomen and pelvis with contrast to assess further.  If any severe changing signs or symptoms recommend ED evaluation.  We will get CBC and metabolic panel today.  Follow-up and 7 to 10 days or as needed.

## 2020-12-05 ENCOUNTER — Encounter: Payer: Self-pay | Admitting: *Deleted

## 2020-12-05 LAB — HM COLONOSCOPY

## 2021-02-26 ENCOUNTER — Other Ambulatory Visit: Payer: Self-pay

## 2021-02-26 ENCOUNTER — Ambulatory Visit
Admission: RE | Admit: 2021-02-26 | Discharge: 2021-02-26 | Disposition: A | Discharge status: Home / Self Care | Payer: MEDICARE | Source: Ambulatory Visit | Attending: Medical | Admitting: Medical

## 2021-02-26 ENCOUNTER — Ambulatory Visit: Payer: MEDICARE

## 2021-02-26 ENCOUNTER — Ambulatory Visit (INDEPENDENT_AMBULATORY_CARE_PROVIDER_SITE_OTHER): Payer: MEDICARE | Admitting: Medical

## 2021-02-26 ENCOUNTER — Encounter: Payer: Self-pay | Admitting: Medical

## 2021-02-26 ENCOUNTER — Other Ambulatory Visit (HOSPITAL_BASED_OUTPATIENT_CLINIC_OR_DEPARTMENT_OTHER): Payer: Self-pay

## 2021-02-26 VITALS — BP 140/70 | HR 63 | Resp 18 | Ht 64.0 in | Wt 179.0 lb

## 2021-02-26 DIAGNOSIS — Z23 Encounter for immunization: Secondary | ICD-10-CM | POA: Diagnosis present

## 2021-02-26 DIAGNOSIS — M25511 Pain in right shoulder: Secondary | ICD-10-CM

## 2021-02-26 DIAGNOSIS — M542 Cervicalgia: Secondary | ICD-10-CM

## 2021-02-26 DIAGNOSIS — M25532 Pain in left wrist: Secondary | ICD-10-CM

## 2021-02-26 DIAGNOSIS — M545 Low back pain, unspecified: Secondary | ICD-10-CM

## 2021-02-26 DIAGNOSIS — K219 Gastro-esophageal reflux disease without esophagitis: Secondary | ICD-10-CM

## 2021-02-26 DIAGNOSIS — M549 Dorsalgia, unspecified: Secondary | ICD-10-CM

## 2021-02-26 LAB — POC URINALSYSI DIPSTICK (AUTOMATED)
Bilirubin, UA: NEGATIVE
Blood, UA: NEGATIVE
Glucose, UA: NEGATIVE
Ketones, UA: NEGATIVE
Leukocytes, UA: NEGATIVE
Nitrite, UA: NEGATIVE
Protein, UA: NEGATIVE
Spec Grav, UA: <=1.005 — AB (ref 1.010–1.025)
Urobilinogen, UA: 0.2 E.U./dL
pH, UA: 5 (ref 5.0–8.0)

## 2021-02-26 MED ORDER — CYCLOBENZAPRINE HCL 5 MG PO TABS
ORAL_TABLET | ORAL | 0 refills | Status: AC
  Filled 2021-02-26: qty 5, 5d supply, fill #0

## 2021-02-26 MED ORDER — TRAMADOL HCL 50 MG PO TABS
50.0000 mg | ORAL_TABLET | Freq: Three times a day (TID) | ORAL | 0 refills | Status: AC | PRN
  Filled 2021-02-26: qty 8, 3d supply, fill #0

## 2021-02-26 NOTE — Addendum Note (Signed)
Addended by: Jeronimo Greaves on: 02/26/2021 10:30 AM   Modules accepted: Orders

## 2021-02-26 NOTE — Patient Instructions (Signed)
Acute onset of right shoulder pain about 2 weeks ago.  Pain on range of motion.  Will get x-ray of right shoulder.  X-ray review consider referral to sports medicine for rotator cuff evaluation.  Lumbosacral spine pain.  Chronic pain in the past.  With neurostimulator in the past.  We will get lumbar spine x-ray and evaluate vertebrae as well as equipment.  Mild right CVA region tenderness.  POCT urine done.  If any indicator of infection we will get culture.  Neck pain mild and chronic.  Some significant degenerative changes on prior x-ray.  We will repeat C-spine x-ray today.  Presently no obvious radicular type pain.  Left wrist pain recently.  Considering arthritic versus tendinitis type pain.  For above areas of pain advised can use low-dose ibuprofen 200 to 400 mg in combination with low-dose Tylenol every 6-8 hours.  Making limited prescription of tramadol and Flexeril available.  Rx advisement given.  History of GERD with improved/resolved symptoms since eating very healthy.  Reviewed patient's EGD and colonoscopy results as he had questions on those.  Follow-up in 2 to 3 weeks or sooner if needed.

## 2021-02-26 NOTE — Progress Notes (Signed)
   Covid-19 Vaccination Clinic  Name:  Brian [name]    MRN: [id number] DOB: September 20, 1945  02/26/2021  Brian [name] was observed post Covid-19 immunization for 15 minutes without incident. He was provided with Vaccine Information Sheet and instruction to access the V-Safe system.   Brian [name] was instructed to call 911 with any severe reactions post vaccine: Difficulty breathing  Swelling of face and throat  A fast heartbeat  A bad rash all over body  Dizziness and weakness

## 2021-02-26 NOTE — Progress Notes (Signed)
Subjective:    Patient ID: Brian [name], male    DOB: 1945/10/11, 75 y.o.   MRN: [id number]  HPI Pt in with some rt shoulder area pain. Pain started over a week ago. No fall or injury. He states limited range of motion due to pain as he tried to abuct his rt arm/lift above head.   On review does not associate neck pain with rt shoulder pain.  Level 8-9/10 at times.  Dorsal aspect wrist pain on and off with movement. 6/10 pain on wrist.  History of some neck pain in past and on xray in 2018. Low level.   IMPRESSION: Multilevel degenerative change stable from the previous exam. No acute abnormality is noted.  Hx of lower back pain in past with nerve stimulator. He thinks this is not helping much. 4/10.  Pt also has questions on GI MD work up done by [employer]. He states did not understand the reports.  Pt states his heartburn has been resolved since summer GI studies for dysphagia and gerd. Symptoms resolved since eating healthy.   1 : distal esophagus bx Tissue Esophagus TISSUE EXAM Kathie Dike, MD 12/05/2020 1143  2 : ascending colon polyp x 2  - cold snare Tissue Large intestine,  ascending TISSUE EXAM Kathie Dike, MD 12/05/2020 1156  3 : transverse polyp bx Tissue Large intestine, transverse TISSUE EXAM  Kathie Dike, MD 12/05/2020 1200  4 : Sigmoid polyp - hot snare Tissue Large intestine, sigmoid TISSUE EXAM  Kathie Dike, MD 12/05/2020 1205   Vaccine review. Pt states he got one shingrix in past ordered by his former pcp.   Tdap is due but explained with medicare typically given if metal injury.  Pt wants flu vaccine today. He got covid booster today.  Review of Systems  Constitutional:  Negative for chills, fatigue and fever.  Respiratory:  Negative for cough, chest tightness, shortness of breath and wheezing.   Cardiovascular:  Negative for chest pain and palpitations.  Gastrointestinal:  Negative for abdominal pain.       Gerd  symtpoms controlled since egd studies/colonoscopy.  Musculoskeletal:  Negative for back pain and joint swelling.       Neck pain, shoulder pain and wrist pain.  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   Chicken pox    Chronic back pain    Dislocation, jaw 1960s   Edema    Left Leg   Essential hypertension, benign    GERD (gastroesophageal reflux disease)    German measles    Heart attack ([place])    Hyperlipidemia    Measles    Mumps    Osteopenia 11.12.10   bone density test   Personal history of other diseases of digestive system 08.10.12   Gastric ulcer     Social History   Socioeconomic History   Marital status: Single    Spouse name: Not on file   Number of children: 0   Years of education: Not on file   Highest education level: Not on file  Occupational History   Not on file  Tobacco Use   Smoking status: Former    Packs/day: 0.25    Years: 2.00  Pack years: 0.50    Types: Cigarettes    Quit date: 06/02/1966    Years since quitting: 54.7   Smokeless tobacco: Never  Vaping Use   Vaping Use: Never used  Substance and Sexual Activity   Alcohol use: No    Alcohol/week: 0.0 standard drinks   Drug use: No   Sexual activity: Not on file  Other Topics Concern   Not on file  Social History Narrative   Not on file   Social Determinants of Health   Financial Resource Strain: Not on file  Food Insecurity: Not on file  Transportation Needs: Not on file  Physical Activity: Not on file  Stress: Not on file  Social Connections: Not on file  Intimate Partner Violence: Not on file    Past Surgical History:  Procedure Laterality Date   [place]  10/28/2017    CATARACT EXTRACTION, BILATERAL  2005   CHOLECYSTECTOMY  08/2016   COLONOSCOPY WITH ESOPHAGOGASTRODUODENOSCOPY (EGD)     about 2016. [place] [facility]   ESOPHAGOGASTRODUODENOSCOPY  06.2011   EYE SURGERY  1972   Bilateral Lens Replacement   FACIAL RECONSTRUCTION SURGERY  2008   LEFT HEART CATH  02.17.2014   MANDIBLE SURGERY  1968, 2008   Dislocated Jaw   PARS PLANA VITRECTOMY W/ REPAIR OF MACULAR HOLE  2005   macular hole repair, right eye   PARS PLANA VITRECTOMY W/ REPAIR OF MACULAR HOLE  2010   mechanical, left eye   PROSTATE BIOPSY  [place] 2017   w/ Dr. [place] Crane   SPINAL CORD STIMULATOR IMPLANT     WISDOM TOOTH EXTRACTION      Family History  Problem Relation Age of Onset   Alzheimer's disease Mother        Deceased   Diabetes Maternal Grandmother    Heart attack Maternal Grandmother    Prostate cancer Maternal Uncle    Diabetes Maternal Uncle    Healthy Son    Cataracts Maternal Aunt    Colon cancer Neg Hx    Esophageal cancer Neg Hx     Allergies  Allergen Reactions   Gabapentin Other (See Comments)    Feel "weird"; Nausea; Weakness; Syncope    Current Outpatient Medications on File Prior to Visit  Medication Sig Dispense Refill   aspirin 81 MG EC tablet Take 1 tablet by mouth daily.     b complex vitamins tablet Take 1 tablet by mouth daily.     Calcium Carbonate-Vitamin D (CALCIUM-VITAMIN D3 PO) Take by mouth daily.     ciprofloxacin (CIPRO) 500 MG tablet Take 1 tablet (500 mg total) by mouth 2 (two) times daily. 14 tablet 0   COVID-19 mRNA vaccine, Moderna, 100 MCG/0.5ML injection INJECT AS DIRECTED .25 mL 0   cyclobenzaprine (FLEXERIL) 5 MG tablet 1 tab po once daily max prn neck pain with ha 5 tablet 0   famotidine (PEPCID) 40 MG tablet TAKE 1 TABLET EVERY DAY 90 tablet 0   hydrochlorothiazide (MICROZIDE) 12.5 MG capsule Take 1 capsule (12.5 mg total) by mouth daily. 90 capsule 1   meclizine (ANTIVERT) 12.5 MG tablet Take 1 tablet (12.5 mg total) by mouth 3  (three) times daily as needed for dizziness. 21 tablet 0   Olopatadine HCl 0.2 % SOLN PLACE 1 DROP INTO BOTH EYES DAILY. 5 mL 0   pantoprazole (PROTONIX) 40 MG tablet TAKE 1 TABLET EVERY DAY 90 tablet 0  rosuvastatin (CRESTOR) 20 MG tablet TAKE 1 TABLET (20 MG TOTAL) BY MOUTH DAILY. 90 tablet 3   sertraline (ZOLOFT) 25 MG tablet Take 1 tablet (25 mg total) by mouth daily. 30 tablet 3   sucralfate (CARAFATE) 1 g tablet TAKE 1 TABLET (1 GRAM TOTAL) BY MOUTH 4 (FOUR) TIMES DAILY -  WITH MEALS AND AT BEDTIME. 360 tablet 2   tamsulosin (FLOMAX) 0.4 MG CAPS capsule Take 2 capsules (0.8 mg total) by mouth daily. 90 capsule 1   timolol (TIMOPTIC) 0.5 % ophthalmic solution Place 1 drop into the left eye daily. 10 mL 12   traMADol (ULTRAM) 50 MG tablet 1 tab po q 8 hours prn pain 15 tablet 0   Current Facility-Administered Medications on File Prior to Visit  Medication Dose Route Frequency Provider Last Rate Last Admin   alum & mag hydroxide-simeth (MAALOX/MYLANTA) 200-200-20 MG/5ML suspension 30 mL  30 mL Oral Once Janaysha Depaulo, PA-C       hyoscyamine (LEVSIN SL) SL tablet 0.125 mg  0.125 mg Sublingual Once Alfons Sulkowski, PA-C       lidocaine (XYLOCAINE) 2 % viscous mouth solution 15 mL  15 mL Mouth/Throat Once Shala Baumbach, PA-C        BP 140/70   Pulse 63   Resp 18   Ht 5\' 4"  (1.626 m)   Wt 179 lb (81.2 kg)   SpO2 99%   BMI 30.73 kg/m       Objective:   Physical Exam  General- No acute distress. Pleasant patient. Neck- Full range of motion, no jvd.  No obvious mid C-spine tenderness to palpation but some right trapezius tenderness to palpation. Lungs- Clear, even and unlabored. Heart- regular rate and rhythm. Neurologic- CNII- XII grossly intact.   Right shoulder-pain on attempted abduction and rotation.  Mild tenderness palpation of the anterior aspect of shoulder as well.  Left wrist-mild pain on flexion and extension and palpation.  No redness, warmth on palpation.  Joints  not swollen.  Back-mild mid lumbar spine tenderness to palpation.  Mild right CVA region tenderness as well.  Abdomen-soft, nondistended, positive bowel sounds, no rebound or guarding.       Assessment & Plan:   Patient Instructions  Acute onset of right shoulder pain about 2 weeks ago.  Pain on range of motion.  Will get x-ray of right shoulder.  X-ray review consider referral to sports medicine for rotator cuff evaluation.  Lumbosacral spine pain.  Chronic pain in the past.  With neurostimulator in the past.  We will get lumbar spine x-ray and evaluate vertebrae as well as equipment.  Mild right CVA region tenderness.  POCT urine done.  If any indicator of infection we will get culture.  Neck pain mild and chronic.  Some significant degenerative changes on prior x-ray.  We will repeat C-spine x-ray today.  Presently no obvious radicular type pain.  Left wrist pain recently.  Considering arthritic versus tendinitis type pain.  For above areas of pain advised can use low-dose ibuprofen 200 to 400 mg in combination with low-dose Tylenol every 6-8 hours.  Making limited prescription of tramadol and Flexeril available.  Rx advisement given.  History of GERD with improved/resolved symptoms since eating very healthy.  Reviewed patient's EGD and colonoscopy results as he had questions on those.  Follow-up in 2 to 3 weeks or sooner if needed.   Mackie Pai, PA-C   Time spent with patient today was 39   minutes which consisted of chart  revdiew, discussing diagnosis, work up treatment and documentation.

## 2021-03-05 ENCOUNTER — Other Ambulatory Visit (HOSPITAL_BASED_OUTPATIENT_CLINIC_OR_DEPARTMENT_OTHER): Payer: Self-pay

## 2021-03-05 MED ORDER — MODERNA COVID-19 BIVAL BOOSTER 50 MCG/0.5ML IM SUSP
INTRAMUSCULAR | 0 refills | Status: DC
  Filled 2021-03-05: qty 0.5, 1d supply, fill #0

## 2021-03-08 ENCOUNTER — Other Ambulatory Visit (HOSPITAL_BASED_OUTPATIENT_CLINIC_OR_DEPARTMENT_OTHER): Payer: Self-pay

## 2021-03-08 MED ORDER — MODERNA COVID-19 BIVAL BOOSTER 50 MCG/0.5ML IM SUSP
INTRAMUSCULAR | 0 refills | Status: DC
  Filled 2021-03-08: qty 0.5, 1d supply, fill #0

## 2021-05-16 ENCOUNTER — Ambulatory Visit: Payer: MEDICARE | Admitting: Medical

## 2021-05-17 ENCOUNTER — Ambulatory Visit (INDEPENDENT_AMBULATORY_CARE_PROVIDER_SITE_OTHER): Payer: MEDICARE | Admitting: Medical

## 2021-05-17 ENCOUNTER — Other Ambulatory Visit (HOSPITAL_BASED_OUTPATIENT_CLINIC_OR_DEPARTMENT_OTHER): Payer: Self-pay

## 2021-05-17 VITALS — BP 140/60 | HR 65 | Temp 98.2°F | Resp 18 | Ht 64.0 in | Wt 189.6 lb

## 2021-05-17 DIAGNOSIS — Z1159 Encounter for screening for other viral diseases: Secondary | ICD-10-CM

## 2021-05-17 DIAGNOSIS — E785 Hyperlipidemia, unspecified: Secondary | ICD-10-CM | POA: Diagnosis not present

## 2021-05-17 DIAGNOSIS — M25511 Pain in right shoulder: Secondary | ICD-10-CM | POA: Diagnosis not present

## 2021-05-17 DIAGNOSIS — R739 Hyperglycemia, unspecified: Secondary | ICD-10-CM | POA: Diagnosis not present

## 2021-05-17 DIAGNOSIS — I10 Essential (primary) hypertension: Secondary | ICD-10-CM

## 2021-05-17 LAB — COMPREHENSIVE METABOLIC PANEL
ALT: 13 U/L (ref 0–53)
AST: 15 U/L (ref 0–37)
Albumin: 3.9 g/dL (ref 3.5–5.2)
Alkaline Phosphatase: 61 U/L (ref 39–117)
BUN: 10 mg/dL (ref 6–23)
CO2: 31 mEq/L (ref 19–32)
Calcium: 9.6 mg/dL (ref 8.4–10.5)
Chloride: 102 mEq/L (ref 96–112)
Creatinine, Ser: 1.12 mg/dL (ref 0.40–1.50)
GFR: 63.38 mL/min (ref >60.00)
Glucose, Bld: 96 mg/dL (ref 70–99)
Potassium: 4.4 mEq/L (ref 3.5–5.1)
Sodium: 139 mEq/L (ref 135–145)
Total Bilirubin: 1.6 mg/dL — ABNORMAL HIGH (ref 0.2–1.2)
Total Protein: 6.7 g/dL (ref 6.0–8.3)

## 2021-05-17 LAB — LIPID PANEL
Cholesterol: 267 mg/dL — ABNORMAL HIGH (ref 0–200)
HDL: 58.9 mg/dL (ref >39.00)
LDL Cholesterol: 178 mg/dL — ABNORMAL HIGH (ref 0–99)
NonHDL: 208.37
Total CHOL/HDL Ratio: 5
Triglycerides: 150 mg/dL — ABNORMAL HIGH (ref 0.0–149.0)
VLDL: 30 mg/dL (ref 0.0–40.0)

## 2021-05-17 LAB — HEMOGLOBIN A1C: Hgb A1c MFr Bld: 5.7 % (ref 4.6–6.5)

## 2021-05-17 MED ORDER — HYDROCODONE-ACETAMINOPHEN 5-325 MG PO TABS
ORAL_TABLET | ORAL | 0 refills | Status: DC
  Filled 2021-05-17: qty 6, 2d supply, fill #0

## 2021-05-17 MED ORDER — METHYLPREDNISOLONE 4 MG PO TBPK
ORAL_TABLET | ORAL | 0 refills | Status: DC
  Filled 2021-05-17: qty 21, 6d supply, fill #0

## 2021-05-17 NOTE — Patient Instructions (Signed)
Acute on chronic right shoulder pain.  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  I will give 6 days of tapered Medrol.  Also making Norco 5/325 tablets available to use at night for severe pain.  Rx advisement given.  If pain persisting then will consider referral to sports medicine.  Hypertension-recently controlled.  Slight borderline level might be related to level of pain.  Continue HCTZ.  Hyperlipidemia-continue Crestor.  We will get metabolic panel and lipid panel today.  Hep C antibody screening.  Mild sugar elevation the past and last year A1c was not in diabetic range.  We will repeat A1c today.  Questions answered regarding vaccines.  Follow-up in 2 weeks or sooner if needed.

## 2021-05-17 NOTE — Progress Notes (Signed)
Subjective:    Patient ID: Brian [name], male    DOB: 12-03-1945, 75 y.o.   MRN: [id number]  HPI Pt states bilateral shoulder pain. Worse on rt side. He states 2 weeks pain in rt shoulder severe. Pt has tried Set designer. He states limited rom and can here cruching on movmement. 8/10 level pain when tries to move arm. At night if rolls on shoulder pain severe.    Descibes overall 6 month of rt shoulder pain but worse over past month.   Pt is not diabetic.   Pt has vaccines questions. Looks like up to date on pneumonia vaccine. Tetanus due but medicar. Shingles due. Pt can get at pharmacy.  Hyperlipidemia- on statin/ crestor. Htn- on hctz.    Review of Systems  Constitutional:  Negative for chills, fatigue and fever.  Respiratory:  Negative for cough, chest tightness, shortness of breath and wheezing.   Cardiovascular:  Negative for chest pain and palpitations.  Gastrointestinal:  Negative for abdominal pain, blood in stool and constipation.  Musculoskeletal:        Shoulder pain.  Skin:  Negative for rash.  Neurological:  Negative for dizziness, seizures, weakness and headaches.  Hematological:  Negative for adenopathy. Does not bruise/bleed easily.  Psychiatric/Behavioral:  Negative for behavioral problems and decreased concentration.     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   Chicken pox    Chronic back pain    Dislocation, jaw 1960s   Edema    Left Leg   Essential hypertension, benign    GERD (gastroesophageal reflux disease)    German measles    Heart attack ([place])    Hyperlipidemia    Measles    Mumps    Osteopenia 11.12.10   bone density test   Personal history of other diseases of digestive system 08.10.12   Gastric ulcer     Social History    Socioeconomic History   Marital status: Single    Spouse name: Not on file   Number of children: 0   Years of education: Not on file   Highest education level: Not on file  Occupational History   Not on file  Tobacco Use   Smoking status: Former    Packs/day: 0.25    Years: 2.00    Pack years: 0.50    Types: Cigarettes    Quit date: 06/02/1966    Years since quitting: 54.9   Smokeless tobacco: Never  Vaping Use   Vaping Use: Never used  Substance and Sexual Activity   Alcohol use: No    Alcohol/week: 0.0 standard drinks   Drug use: No   Sexual activity: Not on file  Other Topics Concern   Not on file  Social History Narrative   Not on file   Social Determinants of Health   Financial Resource Strain: Not on file  Food Insecurity: Not on file  Transportation Needs: Not on file  Physical Activity: Not on file  Stress: Not on file  Social Connections: Not on file  Intimate Partner Violence: Not on file    Past Surgical History:  Procedure Laterality Date   [place]  10/28/2017   CATARACT EXTRACTION, BILATERAL  2005   CHOLECYSTECTOMY  08/2016   COLONOSCOPY WITH ESOPHAGOGASTRODUODENOSCOPY (EGD)     about 2016. [place] [facility]   ESOPHAGOGASTRODUODENOSCOPY  06.2011   EYE SURGERY  1972   Bilateral Lens Replacement   FACIAL RECONSTRUCTION SURGERY  2008   LEFT HEART CATH  02.17.2014   MANDIBLE SURGERY  1968, 2008   Dislocated Jaw   PARS PLANA VITRECTOMY W/ REPAIR OF MACULAR HOLE  2005   macular hole repair, right eye   PARS PLANA VITRECTOMY W/ REPAIR OF MACULAR HOLE  2010   mechanical, left eye   PROSTATE BIOPSY  [place] 2017   w/ Dr. [place] Crane   SPINAL CORD STIMULATOR IMPLANT     WISDOM TOOTH EXTRACTION      Family History  Problem Relation Age of Onset   Alzheimer's disease Mother        Deceased   Diabetes Maternal Grandmother    Heart attack Maternal Grandmother    Prostate cancer Maternal Uncle     Diabetes Maternal Uncle    Healthy Son    Cataracts Maternal Aunt    Colon cancer Neg Hx    Esophageal cancer Neg Hx     Allergies  Allergen Reactions   Gabapentin Other (See Comments)    Feel "weird"; Nausea; Weakness; Syncope    Current Outpatient Medications on File Prior to Visit  Medication Sig Dispense Refill   aspirin 81 MG EC tablet Take 1 tablet by mouth daily.     b complex vitamins tablet Take 1 tablet by mouth daily.     Calcium Carbonate-Vitamin D (CALCIUM-VITAMIN D3 PO) Take by mouth daily.     ciprofloxacin (CIPRO) 500 MG tablet Take 1 tablet (500 mg total) by mouth 2 (two) times daily. 14 tablet 0   cyclobenzaprine (FLEXERIL) 5 MG tablet Take 1 tablet by mouth once a day for muscle spasms 5 tablet 0   famotidine (PEPCID) 40 MG tablet TAKE 1 TABLET EVERY DAY 90 tablet 0   hydrochlorothiazide (MICROZIDE) 12.5 MG capsule Take 1 capsule (12.5 mg total) by mouth daily. 90 capsule 1   meclizine (ANTIVERT) 12.5 MG tablet Take 1 tablet (12.5 mg total) by mouth 3 (three) times daily as needed for dizziness. 21 tablet 0   Olopatadine HCl 0.2 % SOLN PLACE 1 DROP INTO BOTH EYES DAILY. 5 mL 0   pantoprazole (PROTONIX) 40 MG tablet TAKE 1 TABLET EVERY DAY 90 tablet 0   rosuvastatin (CRESTOR) 20 MG tablet TAKE 1 TABLET (20 MG TOTAL) BY MOUTH DAILY. 90 tablet 3   sertraline (ZOLOFT) 25 MG tablet Take 1 tablet (25 mg total) by mouth daily. 30 tablet 3   sucralfate (CARAFATE) 1 g tablet TAKE 1 TABLET (1 GRAM TOTAL) BY MOUTH 4 (FOUR) TIMES DAILY -  WITH MEALS AND AT BEDTIME. 360 tablet 2   tamsulosin (FLOMAX) 0.4 MG CAPS capsule Take 2 capsules (0.8 mg total) by mouth daily. 90 capsule 1   timolol (TIMOPTIC) 0.5 % ophthalmic solution Place 1 drop into the left eye daily. 10 mL 12   traMADol (ULTRAM) 50 MG tablet 1 tab po q 8 hours prn pain 15 tablet 0   Current Facility-Administered Medications on File Prior to Visit  Medication Dose Route Frequency Provider Last Rate Last Admin   alum  & mag hydroxide-simeth (MAALOX/MYLANTA) 200-200-20 MG/5ML suspension 30 mL  30 mL Oral Once Belinda Schlichting, PA-C       hyoscyamine (LEVSIN SL) SL tablet 0.125 mg  0.125 mg Sublingual Once Ryder Chesmore,  Malayah Demuro, PA-C       lidocaine (XYLOCAINE) 2 % viscous mouth solution 15 mL  15 mL Mouth/Throat Once Garnett Nunziata, PA-C        BP 140/60    Pulse 65    Temp 98.2 F (36.8 C)    Resp 18    Ht 5\' 4"  (1.626 m)    Wt 189 lb 9.6 oz (86 kg)    SpO2 97%    BMI 32.54 kg/m       Objective:   Physical Exam   General Mental Status- Alert. General Appearance- Not in acute distress.   Skin General: Color- Normal Color. Moisture- Normal Moisture.  Neck Carotid Arteries- Normal color. Moisture- Normal Moisture. No carotid bruits. No JVD.  Chest and Lung Exam Auscultation: Breath Sounds:-Normal.  Cardiovascular Auscultation:Rythm- Regular. Murmurs & Other Heart Sounds:Auscultation of the heart reveals- No Murmurs.  Abdomen Inspection:-Inspeection Normal. Palpation/Percussion:Note:No mass. Palpation and Percussion of the abdomen reveal- Non Tender, Non Distended + BS, no rebound or guarding.    Neurologic Cranial Nerve exam:- CN III-XII intact(No nystagmus), symmetric smile. Strength:- 5/5 equal and symmetric strength both upper and lower extremities.    Rt shoulder- pain anterior aspection palpation. Diffuculty abducted rue due to shoulder pain. Left shoulder- less pain on palpation. Better rom but some pain as well.       Assessment & Plan:   Patient Instructions  Acute on chronic right shoulder pain.  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  I will give 6 days of tapered Medrol.  Also making Norco 5/325 tablets available to use at night for severe pain.  Rx advisement given.  If pain persisting then will consider referral to sports medicine.  Hypertension-recently controlled.  Slight borderline level might be related to level of pain.  Continue  HCTZ.  Hyperlipidemia-continue Crestor.  We will get metabolic panel and lipid panel today.  Hep C antibody screening.  Mild sugar elevation the past and last year A1c was not in diabetic range.  We will repeat A1c today.  Questions answered regarding vaccines.  Follow-up in 2 weeks or sooner if needed.  Mackie Pai, PA-C

## 2021-05-20 LAB — HEPATITIS C ANTIBODY
Hepatitis C Ab: NONREACTIVE
SIGNAL TO CUT-OFF: 0.03 (ref <1.00)

## 2021-07-08 ENCOUNTER — Other Ambulatory Visit (HOSPITAL_BASED_OUTPATIENT_CLINIC_OR_DEPARTMENT_OTHER): Payer: Self-pay

## 2021-07-08 MED ORDER — DIAZEPAM 5 MG PO TABS
ORAL_TABLET | ORAL | 0 refills | Status: AC
  Filled 2021-07-08: qty 2, 2d supply, fill #0

## 2021-07-09 ENCOUNTER — Other Ambulatory Visit (HOSPITAL_BASED_OUTPATIENT_CLINIC_OR_DEPARTMENT_OTHER): Payer: Self-pay

## 2021-07-17 ENCOUNTER — Ambulatory Visit: Payer: MEDICARE | Admitting: Medical

## 2021-07-24 ENCOUNTER — Ambulatory Visit (INDEPENDENT_AMBULATORY_CARE_PROVIDER_SITE_OTHER): Payer: MEDICARE | Admitting: Medical

## 2021-07-24 VITALS — BP 133/70 | HR 64 | Temp 97.9°F | Resp 18 | Ht 64.0 in | Wt 189.6 lb

## 2021-07-24 DIAGNOSIS — G8929 Other chronic pain: Secondary | ICD-10-CM | POA: Diagnosis not present

## 2021-07-24 DIAGNOSIS — M25511 Pain in right shoulder: Secondary | ICD-10-CM

## 2021-07-24 DIAGNOSIS — E785 Hyperlipidemia, unspecified: Secondary | ICD-10-CM | POA: Diagnosis not present

## 2021-07-24 DIAGNOSIS — K219 Gastro-esophageal reflux disease without esophagitis: Secondary | ICD-10-CM

## 2021-07-24 DIAGNOSIS — N3943 Post-void dribbling: Secondary | ICD-10-CM

## 2021-07-24 DIAGNOSIS — I10 Essential (primary) hypertension: Secondary | ICD-10-CM

## 2021-07-24 DIAGNOSIS — M25512 Pain in left shoulder: Secondary | ICD-10-CM | POA: Diagnosis not present

## 2021-07-24 DIAGNOSIS — N401 Enlarged prostate with lower urinary tract symptoms: Secondary | ICD-10-CM

## 2021-07-24 MED ORDER — KETOROLAC TROMETHAMINE 60 MG/2ML IM SOLN
60.0000 mg | Freq: Once | INTRAMUSCULAR | Status: AC
  Administered 2021-07-24: 60 mg via INTRAMUSCULAR

## 2021-07-24 MED ORDER — ROSUVASTATIN CALCIUM 20 MG PO TABS: 20.0000 mg | ORAL_TABLET | Freq: Every day | ORAL | 3 refills | Status: AC

## 2021-07-24 NOTE — Addendum Note (Signed)
Addended by: Jeronimo Greaves on: 07/24/2021 10:26 AM   Modules accepted: Orders

## 2021-07-24 NOTE — Patient Instructions (Signed)
Chronic bilateral shoulder pain.  Right side has known osteoarthritis.  Did not improve with Medrol taper dose and Norco.  Also failed multiple over-the-counter remedies.  We will give Toradol 60 mg IM injection today.  I went ahead and placed referral to sports medicine.  Hypertension-blood pressure well controlled presently.  You notified me that you have been taken HCTZ since December.  So presently we will keep you off BP meds and follow closely.  Would recommend to get blood pressure cuff over-the-counter and check 3 times weekly.  If you see blood pressure increasing over 140/90 let us know.  Hyperlipidemia-refilling your Crestor today.  GERD recently controlled with just Protonix.  If you start to get recurrent reflux type symptoms let me know.  In that event could add back on famotidine and surgical site.  BPH-reviewed recent note from urologist.  Recommend following through with the cystoscopy.  Finasteride and tamsulosin are medications that were prescribed for BPH.  Recommend that you continue dose.  Follow-up 3 months or sooner if needed.

## 2021-07-24 NOTE — Progress Notes (Signed)
Subjective:    Patient ID: Brian [name], male    DOB: 06-14-1945, 76 y.o.   MRN: [id number]  HPI Pt in with recent rt shoulder pain. Some left side shoulder pain as well.   Rt side greater than left.   sept 2022 xray IMPRESSION: 1. Stable right shoulder osteoarthritis.  No acute bony abnormality.  December visit a/p "Acute on chronic right shoulder pain.  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  I will give 6 days of tapered Medrol.  Also making Norco 5/325 tablets available to use at night for severe pain.  Rx advisement given.  If pain persisting then will consider referral to sports medicine."  Recently salon pas, ben gay and may have tried otc voltaren gel but did not help. Medrol and norco did not help either per pt.  Bph, varicocele and he is scheduled for cystocopy. Opened urologist note and answered his questions to best abilitty as pt did not understand what is dx/plan.  Jerrye Bushy- he needs refill sucraflate and pantoprazole. He has been off famotadine. Improved symptoms with just pantoprazole.  Hyperlipidemia- needs crestor.    Htn- bp controlled event though off hctz since July.    Review of Systems  Constitutional:  Negative for chills, fatigue and unexpected weight change.  Respiratory:  Negative for cough, chest tightness, shortness of breath and wheezing.   Cardiovascular:  Negative for chest pain and palpitations.  Gastrointestinal:  Negative for abdominal pain, constipation, diarrhea and nausea.  Musculoskeletal:  Negative for back pain.       Shoulder pain  Skin:  Negative for rash.  Neurological:  Negative for dizziness, numbness and headaches.  Hematological:  Negative for adenopathy. Does not bruise/bleed easily.  Psychiatric/Behavioral:  Negative for behavioral problems and confusion.     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   Chicken pox    Chronic back pain    Dislocation, jaw 1960s   Edema    Left Leg   Essential hypertension, benign    GERD (gastroesophageal reflux disease)    German measles    Heart attack ([place])    Hyperlipidemia    Measles    Mumps    Osteopenia 11.12.10   bone density test   Personal history of other diseases of digestive system 08.10.12   Gastric ulcer     Social History   Socioeconomic History   Marital status: Single    Spouse name: Not on file   Number of children: 0   Years of education: Not on file   Highest education level: Not on file  Occupational History   Not on file  Tobacco Use   Smoking status: Former    Packs/day: 0.25    Years: 2.00    Pack years: 0.50    Types: Cigarettes    Quit date: 06/02/1966    Years since quitting: 55.1   Smokeless tobacco: Never  Vaping Use   Vaping Use: Never used  Substance and Sexual Activity   Alcohol use: No    Alcohol/week: 0.0 standard drinks   Drug use: No   Sexual activity: Not on file  Other Topics Concern   Not on file  Social History Narrative   Not on file   Social Determinants of Health  Financial Resource Strain: Not on file  Food Insecurity: Not on file  Transportation Needs: Not on file  Physical Activity: Not on file  Stress: Not on file  Social Connections: Not on file  Intimate Partner Violence: Not on file    Past Surgical History:  Procedure Laterality Date   [place]  10/28/2017   CATARACT EXTRACTION, BILATERAL  2005   CHOLECYSTECTOMY  08/2016   COLONOSCOPY WITH ESOPHAGOGASTRODUODENOSCOPY (EGD)     about 2016. [place] [facility]   ESOPHAGOGASTRODUODENOSCOPY  06.2011   EYE SURGERY  1972   Bilateral Lens Replacement   FACIAL RECONSTRUCTION SURGERY  2008   LEFT HEART CATH  02.17.2014   MANDIBLE SURGERY  1968, 2008   Dislocated Jaw   PARS  PLANA VITRECTOMY W/ REPAIR OF MACULAR HOLE  2005   macular hole repair, right eye   PARS PLANA VITRECTOMY W/ REPAIR OF MACULAR HOLE  2010   mechanical, left eye   PROSTATE BIOPSY  [place] 2017   w/ Dr. [place] Crane   SPINAL CORD STIMULATOR IMPLANT     WISDOM TOOTH EXTRACTION      Family History  Problem Relation Age of Onset   Alzheimer's disease Mother        Deceased   Diabetes Maternal Grandmother    Heart attack Maternal Grandmother    Prostate cancer Maternal Uncle    Diabetes Maternal Uncle    Healthy Son    Cataracts Maternal Aunt    Colon cancer Neg Hx    Esophageal cancer Neg Hx     Allergies  Allergen Reactions   Gabapentin Other (See Comments)    Feel "weird"; Nausea; Weakness; Syncope    Current Outpatient Medications on File Prior to Visit  Medication Sig Dispense Refill   aspirin 81 MG EC tablet Take 1 tablet by mouth daily.     b complex vitamins tablet Take 1 tablet by mouth daily.     Calcium Carbonate-Vitamin D (CALCIUM-VITAMIN D3 PO) Take by mouth daily.     ciprofloxacin (CIPRO) 500 MG tablet Take 1 tablet (500 mg total) by mouth 2 (two) times daily. 14 tablet 0   cyclobenzaprine (FLEXERIL) 5 MG tablet Take 1 tablet by mouth once a day for muscle spasms 5 tablet 0   diazepam (VALIUM) 5 MG tablet Take 1 tablet by mouth 30 minutes prior to procedure 2 tablet 0   famotidine (PEPCID) 40 MG tablet TAKE 1 TABLET EVERY DAY 90 tablet 0   HYDROcodone-acetaminophen (NORCO) 5-325 MG tablet Take 1 tablet by mouth every 6 hours as needed for severe pain 6 tablet 0   meclizine (ANTIVERT) 12.5 MG tablet Take 1 tablet (12.5 mg total) by mouth 3 (three) times daily as needed for dizziness. 21 tablet 0   methylPREDNISolone (MEDROL DOSEPAK) 4 MG TBPK tablet Take 6 tablets by mouth on day 1, then 5 tablets on day 2, then 4 tablets on day 3, then 3 tablets on day 4, then 2 tablets on day 5, then 1 tablet on day 6 21 tablet 0   Olopatadine HCl 0.2 % SOLN PLACE 1 DROP INTO BOTH EYES  DAILY. 5 mL 0   pantoprazole (PROTONIX) 40 MG tablet TAKE 1 TABLET EVERY DAY 90 tablet 0   rosuvastatin (CRESTOR) 20 MG tablet TAKE 1 TABLET (20 MG TOTAL) BY MOUTH DAILY. 90 tablet 3   sertraline (ZOLOFT) 25 MG tablet Take 1 tablet (25 mg total) by  mouth daily. 30 tablet 3   sucralfate (CARAFATE) 1 g tablet TAKE 1 TABLET (1 GRAM TOTAL) BY MOUTH 4 (FOUR) TIMES DAILY -  WITH MEALS AND AT BEDTIME. 360 tablet 2   tamsulosin (FLOMAX) 0.4 MG CAPS capsule Take 2 capsules (0.8 mg total) by mouth daily. 90 capsule 1   timolol (TIMOPTIC) 0.5 % ophthalmic solution Place 1 drop into the left eye daily. 10 mL 12   traMADol (ULTRAM) 50 MG tablet 1 tab po q 8 hours prn pain 15 tablet 0   Current Facility-Administered Medications on File Prior to Visit  Medication Dose Route Frequency Provider Last Rate Last Admin   alum & mag hydroxide-simeth (MAALOX/MYLANTA) 200-200-20 MG/5ML suspension 30 mL  30 mL Oral Once Reathel Turi, PA-C       hyoscyamine (LEVSIN SL) SL tablet 0.125 mg  0.125 mg Sublingual Once Jesika Men, PA-C       lidocaine (XYLOCAINE) 2 % viscous mouth solution 15 mL  15 mL Mouth/Throat Once Siren Porrata, PA-C        BP 133/70    Pulse 64    Temp 97.9 F (36.6 C)    Resp 18    Ht 5\' 4"  (1.626 m)    Wt 189 lb 9.6 oz (86 kg)    SpO2 98%    BMI 32.54 kg/m       Objective:   Physical Exam  General Mental Status- Alert. General Appearance- Not in acute distress.   Skin General: Color- Normal Color. Moisture- Normal Moisture.  Neck Carotid Arteries- Normal color. Moisture- Normal Moisture. No carotid bruits. No JVD.  Chest and Lung Exam Auscultation: Breath Sounds:-Normal.  Cardiovascular Auscultation:Rythm- Regular. Murmurs & Other Heart Sounds:Auscultation of the heart reveals- No Murmurs.  Abdomen Inspection:-Inspeection Normal. Palpation/Percussion:Note:No mass. Palpation and Percussion of the abdomen reveal- Non Tender, Non Distended + BS, no rebound or  guarding.   Neurologic Cranial Nerve exam:- CN III-XII intact(No nystagmus), symmetric smile. Strength:- 5/5 equal and symmetric strength both upper and lower extremities.   Rt shoulder- resticted rom due to pain. Left shoulder- mild pain on rom.     Assessment & Plan:   Patient Instructions  Chronic bilateral shoulder pain.  Right side has known osteoarthritis.  Did not improve with Medrol taper dose and Norco.  Also failed multiple over-the-counter remedies.  We will give Toradol 60 mg IM injection today.  I went ahead and placed referral to sports medicine.  Hypertension-blood pressure well controlled presently.  You notified me that you have been taken HCTZ since December.  So presently we will keep you off BP meds and follow closely.  Would recommend to get blood pressure cuff over-the-counter and check 3 times weekly.  If you see blood pressure increasing over 140/90 let us know.  Hyperlipidemia-refilling your Crestor today.  GERD recently controlled with just Protonix.  If you start to get recurrent reflux type symptoms let me know.  In that event could add back on famotidine and surgical site.  BPH-reviewed recent note from urologist.  Recommend following through with the cystoscopy.  Finasteride and tamsulosin are medications that were prescribed for BPH.  Recommend that you continue dose.  Follow-up 3 months or sooner if needed.  Mackie Pai, PA-C

## 2021-07-30 ENCOUNTER — Ambulatory Visit (INDEPENDENT_AMBULATORY_CARE_PROVIDER_SITE_OTHER): Payer: MEDICARE | Admitting: Family Medicine

## 2021-07-30 ENCOUNTER — Encounter: Payer: Self-pay | Admitting: Family Medicine

## 2021-07-30 ENCOUNTER — Ambulatory Visit: Payer: Self-pay

## 2021-07-30 VITALS — BP 120/68 | Ht 64.0 in | Wt 189.0 lb

## 2021-07-30 DIAGNOSIS — M25411 Effusion, right shoulder: Secondary | ICD-10-CM | POA: Diagnosis present

## 2021-07-30 DIAGNOSIS — M25511 Pain in right shoulder: Secondary | ICD-10-CM

## 2021-07-30 MED ORDER — TRIAMCINOLONE ACETONIDE 40 MG/ML IJ SUSP
40.0000 mg | Freq: Once | INTRAMUSCULAR | Status: AC
  Administered 2021-07-30: 40 mg via INTRA_ARTICULAR

## 2021-07-30 NOTE — Assessment & Plan Note (Signed)
Acute on chronic in nature.  Has a significant effusion around the right shoulder.  Little to no effusion on the left.  Possible for a degenerative labrum being the origin versus a gouty source. -Counseled on home exercise therapy and supportive care. -Injection. -Uric acid, sed rate and CRP. -Could consider further imaging.

## 2021-07-30 NOTE — Patient Instructions (Signed)
Nice to meet you  Please try ice as needed  Please try the exercises  Please send me a message in MyChart with any questions or updates.  Please see me back in 3-4 weeks.   --Dr. Raeford Razor

## 2021-07-30 NOTE — Progress Notes (Signed)
Brian [name] - 76 y.o. male MRN [id number]  Date of birth: November 19, 1945  SUBJECTIVE:  Including CC & ROS.  No chief complaint on file.   Brian [name] is a 76 y.o. male that is presenting with acute on chronic bilateral shoulder pain.  The right shoulder is worse than the left.  No history of injury or inciting event.  Review of the note from 2/22 shows he was provided Toradol Independent review of the left shoulder x-ray from 2019 shows no significant glenohumeral changes. Independent review of the right shoulder x-ray from 2022 shows mild degenerative changes of the [facility] joint and glenohumeral joint.  Review of Systems See HPI   HISTORY: Past Medical, Surgical, Social, and Family History Reviewed & Updated per EMR.   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 [place] [facility]   ESOPHAGOGASTRODUODENOSCOPY  06.2011   EYE SURGERY  1972   Bilateral Lens Replacement   FACIAL RECONSTRUCTION SURGERY  2008   LEFT HEART CATH  02.17.2014   MANDIBLE SURGERY  1968, 2008   Dislocated Jaw   PARS PLANA VITRECTOMY W/ REPAIR OF  MACULAR HOLE  2005   macular hole repair, right eye   PARS PLANA VITRECTOMY W/ REPAIR OF MACULAR HOLE  2010   mechanical, left eye   PROSTATE BIOPSY  [place] 2017   w/ Dr. [place] Crane   SPINAL CORD STIMULATOR IMPLANT     WISDOM TOOTH EXTRACTION       PHYSICAL EXAM:  VS: BP 120/68 (BP Location: Left Arm, Patient Position: Sitting)    Ht 5\' 4"  (1.626 m)    Wt 189 lb (85.7 kg)    BMI 32.44 kg/m  Physical Exam Gen: NAD, alert, cooperative with exam, well-appearing MSK:  Neurovascularly intact    Limited ultrasound: Right shoulder:  Significant overlying effusion of the subscapularis. Effusion within the biceps tendon sheath.  Summary: Effusion of the glenohumeral joint  Ultrasound and interpretation by Clearance Coots, MD   Aspiration/Injection Procedure Note Brian [name] 1945-06-22  Procedure: Injection Indications: Right shoulder pain  Procedure Details Consent: Risks of procedure as well as the alternatives and risks of each were explained to the (patient/caregiver).  Consent for procedure obtained. Time Out: Verified patient identification, verified procedure, site/side was marked, verified correct patient position, special equipment/implants available, medications/allergies/relevent history reviewed, required imaging and test results available.  Performed.  The area was cleaned with iodine and alcohol swabs.    The right glenohumeral  joint was injected using 3 cc of 1% lidocaine on a 22-gauge 3-1/2 inch needle.  The syringe was switched and a mixture containing 1 cc's of 40 mg Kenalog and and 4 cc's of 0.25% bupivacaine was injected.  Ultrasound was used. Images were obtained in short views showing the injection.     A sterile dressing was applied.  Patient did tolerate procedure well.    ASSESSMENT & PLAN:   Shoulder effusion, right Acute on chronic in nature.  Has a significant effusion around the right shoulder.  Little to no effusion on the left.  Possible for a  degenerative labrum being the origin versus a gouty source. -Counseled on home exercise therapy and supportive care. -Injection. -Uric acid, sed rate and CRP. -Could consider further imaging.

## 2021-07-31 ENCOUNTER — Telehealth: Payer: Self-pay | Admitting: Family Medicine

## 2021-07-31 LAB — URIC ACID: Uric Acid: 3.9 mg/dL (ref 3.8–8.4)

## 2021-07-31 LAB — C-REACTIVE PROTEIN: CRP: <1 mg/L (ref 0–10)

## 2021-07-31 LAB — SEDIMENTATION RATE: Sed Rate: 14 mm/hr (ref 0–30)

## 2021-07-31 NOTE — Telephone Encounter (Signed)
Informed of results.  ? ?Rosemarie Ax, MD ?[facility] ?07/31/2021, 2:06 PM ? ?

## 2021-08-05 ENCOUNTER — Other Ambulatory Visit: Payer: Self-pay

## 2021-08-05 ENCOUNTER — Other Ambulatory Visit: Payer: Self-pay | Admitting: Medical

## 2021-08-05 ENCOUNTER — Ambulatory Visit (INDEPENDENT_AMBULATORY_CARE_PROVIDER_SITE_OTHER): Payer: MEDICARE | Admitting: Medical

## 2021-08-05 ENCOUNTER — Ambulatory Visit (HOSPITAL_BASED_OUTPATIENT_CLINIC_OR_DEPARTMENT_OTHER)
Admission: RE | Admit: 2021-08-05 | Discharge: 2021-08-05 | Disposition: A | Discharge status: Home / Self Care | Payer: MEDICARE | Source: Ambulatory Visit | Attending: Medical | Admitting: Medical

## 2021-08-05 VITALS — BP 130/69 | HR 54 | Resp 18 | Ht 64.0 in | Wt 187.0 lb

## 2021-08-05 DIAGNOSIS — M25532 Pain in left wrist: Secondary | ICD-10-CM | POA: Insufficient documentation

## 2021-08-05 DIAGNOSIS — R0781 Pleurodynia: Secondary | ICD-10-CM

## 2021-08-05 DIAGNOSIS — M545 Low back pain, unspecified: Secondary | ICD-10-CM

## 2021-08-05 DIAGNOSIS — M79644 Pain in right finger(s): Secondary | ICD-10-CM | POA: Diagnosis present

## 2021-08-05 DIAGNOSIS — S62001A Unspecified fracture of navicular [scaphoid] bone of right wrist, initial encounter for closed fracture: Secondary | ICD-10-CM

## 2021-08-05 DIAGNOSIS — M25531 Pain in right wrist: Secondary | ICD-10-CM

## 2021-08-05 DIAGNOSIS — M546 Pain in thoracic spine: Secondary | ICD-10-CM

## 2021-08-05 MED ORDER — TRAMADOL HCL 50 MG PO TABS: ORAL_TABLET | ORAL | 0 refills | Status: DC

## 2021-08-05 MED ORDER — KETOROLAC TROMETHAMINE 60 MG/2ML IM SOLN
60.0000 mg | Freq: Once | INTRAMUSCULAR | Status: AC
  Administered 2021-08-05: 60 mg via INTRAMUSCULAR

## 2021-08-05 MED ORDER — DICLOFENAC SODIUM 75 MG PO TBEC: 75.0000 mg | DELAYED_RELEASE_TABLET | Freq: Two times a day (BID) | ORAL | 0 refills | Status: AC

## 2021-08-05 NOTE — Patient Instructions (Signed)
One week of pain in rt wrist, rt thumb, left forearm,  tspine, lspine and lower ribs. Get xrays in all painful areas post fall. ? ?Rx toradol 60 mg mg im today. ? ?Will rx diclofenac 75 mg twice daily. Start this tomorrow.  ? ?Rx tramadol to use if needed for more severe type pain not responsive to the above. ? ?Refer to sports med or ortho if fx found. ? ?Follow up date to be determined after xray review. ?

## 2021-08-05 NOTE — Progress Notes (Signed)
? ?Subjective:  ? ? Patient ID: Brian [name], male    DOB: August 31, 1945, 76 y.o.   MRN: [id number] ? ?HPI ? ?Pt has rt  wrist pain, base of rt thumb pain and left forearm. ? ?Also lower thoracic spine pain, lower rib pain and posterior rib pain. ? ?Pt states he had fall about one week ago. Then pain in above areas started day after. ? ?Pt states he was getting ready to go into shower. He had to step into shower and somehow tripped/lost balance.  ? ?No consciousness describe. No head trauma described. ? ?Currently on review no meds for pain or inflammation. ? ?Pain level 6/10. ? ?Review of Systems  ?Constitutional:  Negative for chills, fatigue and fever.  ?Respiratory:  Negative for cough, chest tightness and wheezing.   ?Cardiovascular:  Negative for chest pain and palpitations.  ?Gastrointestinal:  Negative for abdominal pain, nausea and vomiting.  ?Genitourinary:  Negative for difficulty urinating, flank pain and frequency.  ?Musculoskeletal:  Positive for back pain.  ?     See hpi.  ?Skin:  Negative for rash.  ? ?Past Medical History:  ?Diagnosis Date  ? Atypical chest pain   ?  Negative cardiac catheterization 2014  ? Barrett's esophagus   ? 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 Blind right eye   ? 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 Chicken pox   ? Chronic back pain   ? Dislocation, jaw 1960s  ? Edema   ? Left Leg  ? Essential hypertension, benign   ? GERD (gastroesophageal reflux disease)   ? [place] measles   ? Heart attack ([place])   ? Hyperlipidemia   ? Measles   ? Mumps   ? Osteopenia 11.12.10  ? bone density test  ? Personal history of other diseases of digestive system 08.10.12  ? Gastric ulcer  ? ?  ?Social History  ? ?Socioeconomic History  ? Marital status: Single  ?  Spouse name: Not on file  ? Number of children: 0  ? Years of education: Not on file  ? Highest education level: Not on file  ?Occupational History  ? Not on file  ?Tobacco Use  ? Smoking status: Former  ?  Packs/day: 0.25   ?  Years: 2.00  ?  Pack years: 0.50  ?  Types: Cigarettes  ?  Quit date: 06/02/1966  ?  Years since quitting: 55.2  ? Smokeless tobacco: Never  ?Vaping Use  ? Vaping Use: Never used  ?Substance and Sexual Activity  ? Alcohol use: No  ?  Alcohol/week: 0.0 standard drinks  ? Drug use: No  ? Sexual activity: Not on file  ?Other Topics Concern  ? Not on file  ?Social History Narrative  ? Not on file  ? ?Social Determinants of Health  ? ?Financial Resource Strain: Not on file  ?Food Insecurity: Not on file  ?Transportation Needs: Not on file  ?Physical Activity: Not on file  ?Stress: Not on file  ?Social Connections: Not on file  ?Intimate Partner Violence: Not on file  ? ? ?Past Surgical History:  ?Procedure Laterality Date  ? ABDOMINAL HERNIA REPAIR  1972  ? APPENDECTOMY  1973  ? BACK SURGERY  10/28/2017  ? CATARACT EXTRACTION, BILATERAL  2005  ? CHOLECYSTECTOMY  08/2016  ? COLONOSCOPY WITH ESOPHAGOGASTRODUODENOSCOPY (EGD)    ? about 2016. [place] [facility]  ? ESOPHAGOGASTRODUODENOSCOPY  06.2011  ? [place]  ? Bilateral Lens Replacement  ? FACIAL  RECONSTRUCTION SURGERY  2008  ? LEFT HEART CATH  02.17.2014  ? [place], 2008  ? Dislocated Jaw  ? PARS PLANA VITRECTOMY W/ REPAIR OF MACULAR HOLE  2005  ? macular hole repair, right eye  ? PARS PLANA VITRECTOMY W/ REPAIR OF MACULAR HOLE  2010  ? mechanical, left eye  ? PROSTATE BIOPSY  Jul[place] 2017  ? w/ Dr. [place] Crane  ? SPINAL CORD STIMULATOR IMPLANT    ? WISDOM TOOTH EXTRACTION    ? ? ?Family History  ?Problem Relation Age of Onset  ? Alzheimer's disease Mother   ?     Deceased  ? Diabetes Maternal Grandmother   ? Heart attack Maternal Grandmother   ? Prostate cancer Maternal Uncle   ? Diabetes Maternal Uncle   ? Healthy Son   ? Cataracts Maternal Aunt   ? Colon cancer Neg Hx   ? Esophageal cancer Neg Hx   ? ? ?Allergies  ?Allergen Reactions  ? Gabapentin Other (See Comments)  ?  Feel "weird"; Nausea; Weakness; Syncope  ? ? ?Current Outpatient Medications on  File Prior to Visit  ?Medication Sig Dispense Refill  ? aspirin 81 MG EC tablet Take 1 tablet by mouth daily.    ? b complex vitamins tablet Take 1 tablet by mouth daily.    ? Calcium Carbonate-Vitamin D (CALCIUM-VITAMIN D3 PO) Take by mouth daily.    ? ciprofloxacin (CIPRO) 500 MG tablet Take 1 tablet (500 mg total) by mouth 2 (two) times daily. 14 tablet 0  ? cyclobenzaprine (FLEXERIL) 5 MG tablet Take 1 tablet by mouth once a day for muscle spasms 5 tablet 0  ? diazepam (VALIUM) 5 MG tablet Take 1 tablet by mouth 30 minutes prior to procedure 2 tablet 0  ? famotidine (PEPCID) 40 MG tablet TAKE 1 TABLET EVERY DAY 90 tablet 0  ? HYDROcodone-acetaminophen (NORCO) 5-325 MG tablet Take 1 tablet by mouth every 6 hours as needed for severe pain 6 tablet 0  ? meclizine (ANTIVERT) 12.5 MG tablet Take 1 tablet (12.5 mg total) by mouth 3 (three) times daily as needed for dizziness. 21 tablet 0  ? methylPREDNISolone (MEDROL DOSEPAK) 4 MG TBPK tablet Take 6 tablets by mouth on day 1, then 5 tablets on day 2, then 4 tablets on day 3, then 3 tablets on day 4, then 2 tablets on day 5, then 1 tablet on day 6 21 tablet 0  ? Olopatadine HCl 0.2 % SOLN PLACE 1 DROP INTO BOTH EYES DAILY. 5 mL 0  ? pantoprazole (PROTONIX) 40 MG tablet TAKE 1 TABLET EVERY DAY 90 tablet 0  ? rosuvastatin (CRESTOR) 20 MG tablet TAKE 1 TABLET (20 MG TOTAL) BY MOUTH DAILY. 90 tablet 3  ? rosuvastatin (CRESTOR) 20 MG tablet Take 1 tablet (20 mg total) by mouth daily. 90 tablet 3  ? sertraline (ZOLOFT) 25 MG tablet Take 1 tablet (25 mg total) by mouth daily. 30 tablet 3  ? tamsulosin (FLOMAX) 0.4 MG CAPS capsule Take 2 capsules (0.8 mg total) by mouth daily. 90 capsule 1  ? timolol (TIMOPTIC) 0.5 % ophthalmic solution Place 1 drop into the left eye daily. 10 mL 12  ? traMADol (ULTRAM) 50 MG tablet 1 tab po q 8 hours prn pain 15 tablet 0  ? ?Current Facility-Administered Medications on File Prior to Visit  ?Medication Dose Route Frequency Provider Last Rate  Last Admin  ? alum & mag hydroxide-simeth (MAALOX/MYLANTA) 200-200-20 MG/5ML suspension 30 mL  30 mL  Oral Once Hermenegildo Clausen, Percell Miller, PA-C      ? hyoscyamine (LEVSIN SL) SL tablet 0.125 mg  0.125 mg Sublingual Once Lariyah Shetterly, PA-C      ? lidocaine (XYLOCAINE) 2 % viscous mouth solution 15 mL  15 mL Mouth/Throat Once Jeaninne Lodico, Percell Miller, PA-C      ? ? ?BP 130/69   Pulse (!) 54   Resp 18   Ht '5\' 4"'$  (1.626 m)   Wt 187 lb (84.8 kg)   SpO2 97%   BMI 32.10 kg/m?  ?  ?   ?Objective:  ? Physical Exam ? ? ?General ?Mental Status- Alert. General Appearance- Not in acute distress.  ? ?Skin ?General: Color- Normal Color. Moisture- Normal Moisture. ? ?Neck ?Carotid Arteries- Normal color. Moisture- Normal Moisture. No carotid bruits. No JVD. ? ?Chest and Lung Exam ?Auscultation: ?Breath Sounds:-Normal. ? ?Cardiovascular ?Auscultation:Rythm- Regular. ?Murmurs & Other Heart Sounds:Auscultation of the heart reveals- No Murmurs. ? ?Abdomen ?Inspection:-Inspeection Normal. ?Palpation/Percussion:Note:No mass. Palpation and Percussion of the abdomen reveal- Non Tender, Non Distended + BS, no rebound or guarding. ? ? ? ?Neurologic ?Cranial Nerve exam:- CN III-XII intact(No nystagmus), symmetric smile. ?Drift Test:- No drift. ?Finger to Nose:- Normal/Intact ?Strength:- 5/5 equal and symmetric strength both upper and lower extremities.  ? ? ?Rt wrist- mild pain on palpation and tom. ?Rt thumb- pain at base scaphoid area. ?Left wrist- no pain on palpation.  ?Left forearm- distal 1/3 faint tenderness. ?Back- fiant lower mid tspine area pain on palpatin and upper lumbar region tenderness. ?Ribs- posterior bilateral rib area faint pain on palpation. ? ?   ?Assessment & Plan:  ? ?Patient Instructions  ?One week of pain in rt wrist, rt thumb, left forearm,  tspine, lspine and lower ribs. Get xrays in all painful areas post fall. ? ?Rx toradol 60 mg mg im today. ? ?Will rx diclofenac 75 mg twice daily. Start this tomorrow.  ? ?Rx tramadol to  use if needed for more severe type pain not responsive to the above. ? ?Refer to sports med or ortho if fx found. ? ?Follow up date to be determined after xray review.  ? ?Mackie Pai, PA-C  ? ?

## 2021-08-05 NOTE — Addendum Note (Signed)
Addended by: Anabel Halon on: 08/05/2021 12:43 PM ? ? Modules accepted: Orders ? ?

## 2021-08-05 NOTE — Addendum Note (Signed)
Addended by: Jeronimo Greaves on: 08/05/2021 10:37 AM ? ? Modules accepted: Orders ? ?

## 2021-08-07 ENCOUNTER — Telehealth: Payer: Self-pay | Admitting: Medical

## 2021-08-07 NOTE — Telephone Encounter (Signed)
Patient wanted to make Brian [name] aware that Brian [name] told him the Toradol was discontinues. Please advise.  ?

## 2021-08-07 NOTE — Telephone Encounter (Signed)
Pt verified it was Toradol and tramadol  ? ?Toradol medication was given in office not sent to pharmacy  ?

## 2021-08-13 ENCOUNTER — Telehealth: Payer: Self-pay | Admitting: Medical

## 2021-08-13 ENCOUNTER — Other Ambulatory Visit: Payer: Self-pay | Admitting: Medical

## 2021-08-13 MED ORDER — TRAMADOL HCL 50 MG PO TABS: ORAL_TABLET | ORAL | 0 refills | Status: AC

## 2021-08-13 NOTE — Telephone Encounter (Signed)
Medication: traMADol (ULTRAM) 50 MG tablet  ? ?Preferred Pharmacy (with phone number or street name):  ?[facility], [address]  ?[place], [place] [address]  ?Phone:  [phone]  Fax:  [phone]  ? ?Agent: Please be advised that RX refills may take up to 3 business days. We ask that you follow-up with your pharmacy.  ?

## 2021-08-13 NOTE — Telephone Encounter (Signed)
Rx tramadol sent to pt pharmacy. ? ?Mackie Pai, PA-C  ?

## 2021-08-13 NOTE — Telephone Encounter (Signed)
Printed on 08/05/21 never sent to pharmacy ?

## 2021-08-27 ENCOUNTER — Ambulatory Visit (INDEPENDENT_AMBULATORY_CARE_PROVIDER_SITE_OTHER): Payer: MEDICARE | Admitting: Family Medicine

## 2021-08-27 ENCOUNTER — Encounter: Payer: Self-pay | Admitting: Family Medicine

## 2021-08-27 DIAGNOSIS — M25411 Effusion, right shoulder: Secondary | ICD-10-CM

## 2021-08-27 NOTE — Assessment & Plan Note (Signed)
Improving since the injection.  Has mild pain intermittently. ?-Counseled on home exercise therapy and supportive care. ?-Could consider further imaging or physical therapy. ?

## 2021-08-27 NOTE — Progress Notes (Signed)
?  Brian [name] - 76 y.o. male MRN [id number]  Date of birth: Aug 10, 1945 ? ?SUBJECTIVE:  Including CC & ROS.  ?No chief complaint on file. ? ? ?Brian [name] is a 76 y.o. male that is following up for his shoulder pain.  He has been doing well since the injection.  He notices some pain with the weather is worse.  No current constant pain.  He has been doing the home exercises. ? ? ?Review of Systems ?See HPI  ? ?HISTORY: Past Medical, Surgical, Social, and Family History Reviewed & Updated per EMR.   ?Pertinent Historical Findings include: ? ?Past Medical History:  ?Diagnosis Date  ? Atypical chest pain   ?  Negative cardiac catheterization 2014  ? Barrett's esophagus   ? 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 Blind right eye   ? 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 Chicken pox   ? Chronic back pain   ? Dislocation, jaw 1960s  ? Edema   ? Left Leg  ? Essential hypertension, benign   ? GERD (gastroesophageal reflux disease)   ? [place] measles   ? Heart attack ([place])   ? Hyperlipidemia   ? Measles   ? Mumps   ? Osteopenia 11.12.10  ? bone density test  ? Personal history of other diseases of digestive system 08.10.12  ? Gastric ulcer  ? ? ?Past Surgical History:  ?Procedure Laterality Date  ? ABDOMINAL HERNIA REPAIR  1972  ? APPENDECTOMY  1973  ? BACK SURGERY  10/28/2017  ? CATARACT EXTRACTION, BILATERAL  2005  ? CHOLECYSTECTOMY  08/2016  ? COLONOSCOPY WITH ESOPHAGOGASTRODUODENOSCOPY (EGD)    ? about 2016. [facility]  ? ESOPHAGOGASTRODUODENOSCOPY  06.2011  ? [place]  ? Bilateral Lens Replacement  ? FACIAL RECONSTRUCTION SURGERY  2008  ? LEFT HEART CATH  02.17.2014  ? [place], 2008  ? Dislocated Jaw  ? PARS PLANA VITRECTOMY W/ REPAIR OF MACULAR HOLE  2005  ? macular hole repair, right eye  ? PARS PLANA VITRECTOMY W/ REPAIR OF MACULAR HOLE  2010  ? mechanical, left eye  ? PROSTATE BIOPSY  Jul[place] 2017  ? w/ Dr. Nevada Crane  ? SPINAL CORD STIMULATOR IMPLANT    ? WISDOM TOOTH  EXTRACTION    ? ? ? ?PHYSICAL EXAM:  ?VS: BP 120/76 (BP Location: Left Arm, Patient Position: Sitting)   Ht '5\' 4"'$  (1.626 m)   Wt 187 lb (84.8 kg)   BMI 32.10 kg/m?  ?Physical Exam ?Gen: NAD, alert, cooperative with exam, well-appearing ?MSK:  ?Neurovascularly intact   ? ? ? ? ?ASSESSMENT & PLAN:  ? ?Shoulder effusion, right ?Improving since the injection.  Has mild pain intermittently. ?-Counseled on home exercise therapy and supportive care. ?-Could consider further imaging or physical therapy. ? ? ? ? ?

## 2021-10-22 ENCOUNTER — Other Ambulatory Visit (HOSPITAL_BASED_OUTPATIENT_CLINIC_OR_DEPARTMENT_OTHER): Payer: Self-pay

## 2021-10-22 ENCOUNTER — Ambulatory Visit (HOSPITAL_BASED_OUTPATIENT_CLINIC_OR_DEPARTMENT_OTHER)
Admission: RE | Admit: 2021-10-22 | Discharge: 2021-10-22 | Disposition: A | Discharge status: Home / Self Care | Payer: MEDICARE | Source: Ambulatory Visit | Attending: Medical | Admitting: Medical

## 2021-10-22 ENCOUNTER — Ambulatory Visit (INDEPENDENT_AMBULATORY_CARE_PROVIDER_SITE_OTHER): Payer: MEDICARE | Admitting: Medical

## 2021-10-22 VITALS — BP 110/80 | HR 60 | Temp 98.5°F | Resp 18 | Ht 64.0 in | Wt 190.4 lb

## 2021-10-22 DIAGNOSIS — M542 Cervicalgia: Secondary | ICD-10-CM | POA: Insufficient documentation

## 2021-10-22 DIAGNOSIS — M5410 Radiculopathy, site unspecified: Secondary | ICD-10-CM | POA: Insufficient documentation

## 2021-10-22 DIAGNOSIS — G5602 Carpal tunnel syndrome, left upper limb: Secondary | ICD-10-CM | POA: Diagnosis not present

## 2021-10-22 DIAGNOSIS — R413 Other amnesia: Secondary | ICD-10-CM

## 2021-10-22 MED ORDER — DONEPEZIL HCL 10 MG PO TABS: 10.0000 mg | ORAL_TABLET | Freq: Every day | ORAL | 3 refills | Status: AC

## 2021-10-22 MED ORDER — LEVOCETIRIZINE DIHYDROCHLORIDE 5 MG PO TABS: 5.0000 mg | ORAL_TABLET | Freq: Every evening | ORAL | 0 refills | Status: AC

## 2021-10-22 MED ORDER — PREDNISONE 10 MG PO TABS
ORAL_TABLET | ORAL | 0 refills | Status: AC
  Filled 2021-10-22: qty 21, 6d supply, fill #0

## 2021-10-22 NOTE — Progress Notes (Signed)
Subjective:    Patient ID: Brian [name], male    DOB: 05-28-1946, 76 y.o.   MRN: [id number]  HPI Pt states that on Friday he started to sneeze and get cough as well as runny nose. Pt states this occurred after neighbor cut grass. Some pnd and nasal congestion. No body aches, no fever or chills.   Pt also states on and off his left hand numb sensation that goes up toward elbow. Pt states never loose function of hand just feels numb. Does not get worse at night. He does associate some neck pain with had numbess. On review I don't see x ray of c spine in the past.  Also left ankle pain that comes and goes. Last time had pain in ankle was on Monday.   01-02-2020 left ankle xray was normal.   Pt has some memory loss in past. He expresses concern about and question if can use prevagen. In past pt saw neurologist and recommended aricept 10 mg daily. Pt never followed up with neurologist.   Review of Systems  Constitutional:  Negative for chills, fatigue and fever.  Respiratory:  Negative for cough, chest tightness, shortness of breath and wheezing.   Cardiovascular:  Negative for chest pain and palpitations.  Gastrointestinal:  Negative for abdominal pain and nausea.  Musculoskeletal:  Negative for arthralgias and back pain.  Psychiatric/Behavioral:  Negative for behavioral problems, decreased concentration, dysphoric mood and sleep disturbance. The patient is not nervous/anxious.        Memory issues. See neurologist notes.     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   Chicken pox    Chronic back pain    Dislocation, jaw 1960s   Edema    Left Leg   Essential hypertension, benign    GERD (gastroesophageal reflux disease)    [place] measles    Heart attack ([place])    Hyperlipidemia     Measles    Mumps    Osteopenia 11.12.10   bone density test   Personal history of other diseases of digestive system 08.10.12   Gastric ulcer     Social History   Socioeconomic History   Marital status: Single    Spouse name: Not on file   Number of children: 0   Years of education: Not on file   Highest education level: Not on file  Occupational History   Not on file  Tobacco Use   Smoking status: Former    Packs/day: 0.25    Years: 2.00    Pack years: 0.50    Types: Cigarettes    Quit date: 06/02/1966    Years since quitting: 55.4   Smokeless tobacco: Never  Vaping Use   Vaping Use: Never used  Substance and Sexual Activity   Alcohol use: No    Alcohol/week: 0.0 standard drinks   Drug use: No   Sexual activity: Not on file  Other Topics Concern   Not on file  Social History Narrative   Not on file   Social Determinants of Health   Financial Resource Strain: Not on file  Food Insecurity: Not on file  Transportation Needs: Not on file  Physical Activity: Not on file  Stress: Not on file  Social Connections: Not on file  Intimate Partner  Violence: Not on file    Past Surgical History:  Procedure Laterality Date   [place] SURGERY  10/28/2017   CATARACT EXTRACTION, BILATERAL  2005   CHOLECYSTECTOMY  08/2016   COLONOSCOPY WITH ESOPHAGOGASTRODUODENOSCOPY (EGD)     about 2016. [facility]   ESOPHAGOGASTRODUODENOSCOPY  06.2011   EYE SURGERY  1972   Bilateral Lens Replacement   FACIAL RECONSTRUCTION SURGERY  2008   LEFT HEART CATH  02.17.2014   MANDIBLE SURGERY  1968, 2008   Dislocated Jaw   PARS PLANA VITRECTOMY W/ REPAIR OF MACULAR HOLE  2005   macular hole repair, right eye   PARS PLANA VITRECTOMY W/ REPAIR OF MACULAR HOLE  2010   mechanical, left eye   PROSTATE BIOPSY  Jul[place] 2017   w/ Dr. Nevada Crane   SPINAL CORD STIMULATOR IMPLANT     WISDOM TOOTH EXTRACTION      Family History  Problem Relation  Age of Onset   Alzheimer's disease Mother        Deceased   Diabetes Maternal Grandmother    Heart attack Maternal Grandmother    Prostate cancer Maternal Uncle    Diabetes Maternal Uncle    Healthy Son    Cataracts Maternal Aunt    Colon cancer Neg Hx    Esophageal cancer Neg Hx     Allergies  Allergen Reactions   Gabapentin Other (See Comments)    Feel "weird"; Nausea; Weakness; Syncope    Current Outpatient Medications on File Prior to Visit  Medication Sig Dispense Refill   aspirin 81 MG EC tablet Take 1 tablet by mouth daily.     b complex vitamins tablet Take 1 tablet by mouth daily.     Calcium Carbonate-Vitamin D (CALCIUM-VITAMIN D3 PO) Take by mouth daily.     ciprofloxacin (CIPRO) 500 MG tablet Take 1 tablet (500 mg total) by mouth 2 (two) times daily. 14 tablet 0   cyclobenzaprine (FLEXERIL) 5 MG tablet Take 1 tablet by mouth once a day for muscle spasms 5 tablet 0   diazepam (VALIUM) 5 MG tablet Take 1 tablet by mouth 30 minutes prior to procedure 2 tablet 0   diclofenac (VOLTAREN) 75 MG EC tablet Take 1 tablet (75 mg total) by mouth 2 (two) times daily. 20 tablet 0   famotidine (PEPCID) 40 MG tablet TAKE 1 TABLET EVERY DAY 90 tablet 0   meclizine (ANTIVERT) 12.5 MG tablet Take 1 tablet (12.5 mg total) by mouth 3 (three) times daily as needed for dizziness. 21 tablet 0   methylPREDNISolone (MEDROL DOSEPAK) 4 MG TBPK tablet Take 6 tablets by mouth on day 1, then 5 tablets on day 2, then 4 tablets on day 3, then 3 tablets on day 4, then 2 tablets on day 5, then 1 tablet on day 6 21 tablet 0   pantoprazole (PROTONIX) 40 MG tablet TAKE 1 TABLET EVERY DAY 90 tablet 0   rosuvastatin (CRESTOR) 20 MG tablet TAKE 1 TABLET (20 MG TOTAL) BY MOUTH DAILY. 90 tablet 3   rosuvastatin (CRESTOR) 20 MG tablet Take 1 tablet (20 mg total) by mouth daily. 90 tablet 3   tamsulosin (FLOMAX) 0.4 MG CAPS capsule Take 2 capsules (0.8 mg total) by mouth daily. 90 capsule 1   timolol (TIMOPTIC)  0.5 % ophthalmic solution Place 1 drop into the left eye daily. 10 mL 12   traMADol (ULTRAM) 50 MG tablet 1 tab po q  8 hours prn pain 15 tablet 0   Current Facility-Administered Medications on File Prior to Visit  Medication Dose Route Frequency Provider Last Rate Last Admin   alum & mag hydroxide-simeth (MAALOX/MYLANTA) 200-200-20 MG/5ML suspension 30 mL  30 mL Oral Once Michayla Mcneil, PA-C       hyoscyamine (LEVSIN SL) SL tablet 0.125 mg  0.125 mg Sublingual Once Barbi Kumagai, PA-C       lidocaine (XYLOCAINE) 2 % viscous mouth solution 15 mL  15 mL Mouth/Throat Once Deshante Cassell, PA-C        BP 110/80   Pulse 60   Temp 98.5 F (36.9 C)   Resp 18   Ht '5\' 4"'$  (1.626 m)   Wt 190 lb 6.4 oz (86.4 kg)   SpO2 99%   BMI 32.68 kg/m       Objective:   Physical Exam  General Mental Status- Alert. General Appearance- Not in acute distress.   Skin General: Color- Normal Color. Moisture- Normal Moisture.  Neck Carotid Arteries- Normal color. Moisture- Normal Moisture. No carotid bruits. No JVD. Mild mid cspine tenderness to palpation.  Chest and Lung Exam Auscultation: Breath Sounds:-Normal.  Cardiovascular Auscultation:Rythm- Regular. Murmurs & Other Heart Sounds:Auscultation of the heart reveals- No Murmurs.  Abdomen Inspection:-Inspeection Normal. Palpation/Percussion:Note:No mass. Palpation and Percussion of the abdomen reveal- Non Tender, Non Distended + BS, no rebound or guarding.   Neurologic Cranial Nerve exam:- CN III-XII intact(No nystagmus), symmetric smile. Strength:- 5/5 equal and symmetric strength both upper and lower extremities.  Upper ext- wrist manipulation induces more tinling/numbness. Left upper ext normal grip strenght and coordination.  Heent- mild boggy turbinates and pnd.    Assessment & Plan:   Patient Instructions  Some neck pain with associated left wrist and forearm numbness. On exam wrist manipulation induce more numbness in finger  and upwards. May have c spine foraminal nerve impingement and/or carpel tunnel syndrome.  Will get c spine xray today and rx prednisone 6 taper dose.  Also can get left side wrist cock up splint and use daily.  For memory loss refilled your aricept after reviewing your former neurologist note. Will follow response and could refer to new neurologist in future as well.  Also recent allergy signs/symptoms. Will prescribe xyzal antihistamine to use at  night. Though you also may respond to taper prednisone.   Follow up in 3 weeks or sooner if needed.     Mackie Pai, PA-C

## 2021-10-22 NOTE — Patient Instructions (Addendum)
Some neck pain with associated left wrist and forearm numbness. On exam wrist manipulation induce more numbness in finger and upwards. May have c spine foraminal nerve impingement and/or carpel tunnel syndrome.  Will get c spine xray today and rx prednisone 6 taper dose.  Also can get left side wrist cock up splint and use daily.  For memory loss refilled your aricept after reviewing your former neurologist note. Will follow response and could refer to new neurologist in future as well.  Also recent allergy signs/symptoms. Will prescribe xyzal antihistamine to use at  night. Though you also may respond to taper prednisone.   Follow up in 3 weeks or sooner if needed.

## 2021-10-22 NOTE — Addendum Note (Signed)
Addended by: Anabel Halon on: 10/22/2021 10:22 AM   Modules accepted: Orders

## 2022-01-13 ENCOUNTER — Emergency Department (HOSPITAL_COMMUNITY)
Admission: EM | Admit: 2022-01-13 | Discharge: 2022-01-13 | Disposition: A | Discharge status: Home / Self Care | Payer: MEDICARE | Attending: Emergency Medicine | Admitting: Emergency Medicine

## 2022-01-13 ENCOUNTER — Emergency Department (HOSPITAL_COMMUNITY): Payer: MEDICARE

## 2022-01-13 ENCOUNTER — Other Ambulatory Visit: Payer: Self-pay

## 2022-01-13 ENCOUNTER — Encounter (HOSPITAL_COMMUNITY): Payer: Self-pay | Admitting: Emergency Medicine

## 2022-01-13 DIAGNOSIS — S0990XA Unspecified injury of head, initial encounter: Secondary | ICD-10-CM

## 2022-01-13 DIAGNOSIS — W541XXA Struck by dog, initial encounter: Secondary | ICD-10-CM | POA: Insufficient documentation

## 2022-01-13 DIAGNOSIS — S0181XA Laceration without foreign body of other part of head, initial encounter: Secondary | ICD-10-CM | POA: Diagnosis not present

## 2022-01-13 DIAGNOSIS — Z23 Encounter for immunization: Secondary | ICD-10-CM | POA: Insufficient documentation

## 2022-01-13 DIAGNOSIS — S0080XA Unspecified superficial injury of other part of head, initial encounter: Secondary | ICD-10-CM | POA: Diagnosis present

## 2022-01-13 MED ORDER — TETANUS-DIPHTH-ACELL PERTUSSIS 5-2.5-18.5 LF-MCG/0.5 IM SUSY
0.5000 mL | PREFILLED_SYRINGE | Freq: Once | INTRAMUSCULAR | Status: AC
  Administered 2022-01-13: 0.5 mL via INTRAMUSCULAR
  Filled 2022-01-13: qty 0.5

## 2022-01-13 MED ORDER — LIDOCAINE-EPINEPHRINE (PF) 2 %-1:200000 IJ SOLN
20.0000 mL | Freq: Once | INTRAMUSCULAR | Status: DC
  Filled 2022-01-13: qty 20

## 2022-01-13 NOTE — ED Triage Notes (Signed)
Patient BIB [facility] from home c/o a fall while tripping over dog.  Patient reports hitting head on drawer handle resulting in approx 2 inch laceration to right side of forehead.  Patient denies LOC and denies blood thinners.  118/86 55 HR 97% RA 109 CBG

## 2022-01-13 NOTE — ED Provider Notes (Cosign Needed Addendum)
[facility] Emergency Department Provider Note MRN:  [id number]  Arrival date & time: 01/13/22     Chief Complaint   Fall and Head Laceration   History of Present Illness   Brian [name] is a 76 y.o. year-old male presents to the ED with chief complaint of mechanical fall at home.  He states that he tripped over his dog.  He hit his head on the dresser drawer handle.  He sustained a laceration to his right upper forehead approximately 2 inches.  He denies loss of consciousness.  He denies taking any blood thinners.  He also complains of right shoulder pain, but states this is probably just from his arthritis.Marland Kitchen  History provided by patient.   Review of Systems  Pertinent positive and negative review of systems noted in HPI.    Physical Exam   Vitals:   01/13/22 0330 01/13/22 0430  BP: (!) 154/72 (!) 143/71  Pulse: (!) 53 (!) 58  Resp: 16 17  Temp:    SpO2: 100% 98%    CONSTITUTIONAL:  well-appearing, NAD NEURO:  Alert and oriented x 3, CN 3-12 grossly intact EYES:  prosthetic right, abnormal pupil L (baseline) ENT/NECK:  Supple, no stridor  CARDIO:  appears well-perfused  PULM:  No respiratory distress,  GI/GU:  non-distended,  MSK/SPINE:  No gross deformities, no edema, moves all extremities  SKIN:  no rash, 3 cm laceration to the right upper forehead   *Additional and/or pertinent findings included in MDM below  Diagnostic and Interventional Summary    EKG Interpretation  Date/Time:    Ventricular Rate:    PR Interval:    QRS Duration:   QT Interval:    QTC Calculation:   R Axis:     Text Interpretation:         Labs Reviewed - No data to display  DG Shoulder Right  Final Result    CT HEAD WO CONTRAST (5MM)  Final Result    CT Cervical Spine Wo Contrast  Final Result      Medications  lidocaine-EPINEPHrine (XYLOCAINE W/EPI) 2 %-1:200000 (PF) injection 20 mL (has no administration in time range)  Tdap (BOOSTRIX)  injection 0.5 mL (0.5 mLs Intramuscular Given 01/13/22 0500)     Procedures  /  Critical Care .Marland KitchenLaceration Repair  Date/Time: 01/13/2022 4:51 AM  Performed by: Montine Circle, PA-C Authorized by: Montine Circle, PA-C   Consent:    Consent obtained:  Verbal   Consent given by:  Patient   Risks discussed:  Infection, need for additional repair, pain, poor cosmetic result and poor wound healing   Alternatives discussed:  No treatment and delayed treatment Universal protocol:    Procedure explained and questions answered to patient or proxy's satisfaction: yes     Relevant documents present and verified: yes     Test results available: yes     Imaging studies available: yes     Required blood products, implants, devices, and special equipment available: yes     Site/side marked: yes     Immediately prior to procedure, a time out was called: yes     Patient identity confirmed:  Verbally with patient Anesthesia:    Anesthesia method:  Local infiltration   Local anesthetic:  Lidocaine 1% WITH epi Laceration details:    Location:  Face   Face location:  Forehead   Length (cm):  3 Pre-procedure details:    Preparation:  Patient was prepped and draped in usual sterile fashion and  imaging obtained to evaluate for foreign bodies Treatment:    Area cleansed with:  Saline Skin repair:    Repair method:  Sutures   Suture size:  5-0   Suture material:  Prolene   Suture technique:  Running locked   Number of sutures:  6 Approximation:    Approximation:  Close Repair type:    Repair type:  Simple Post-procedure details:    Dressing:  Open (no dressing)   ED Course and Medical Decision Making  I have reviewed the triage vital signs, the nursing notes, and pertinent available records from the EMR.  Social Determinants Affecting Complexity of Care: Patient has no clinically significant social determinants affecting this chief complaint..   ED Course:    Medical Decision  Making Patient here after a fall at home.  He tripped over his dog.  It was ground-level.  He did not lose consciousness.  He sustained a laceration of his forehead which I repaired in the emergency department.  CT imaging discussed below is negative.  Feel that patient is stable for discharge and outpatient follow-up.  Tetanus shot updated.  Amount and/or Complexity of Data Reviewed Radiology: ordered and independent interpretation performed.    Details: No sign of skull fracture, no ICH  Risk Prescription drug management.     Consultants: No consultations were needed in caring for this patient.   Treatment and Plan: Emergency department workup does not suggest an emergent condition requiring admission or immediate intervention beyond  what has been performed at this time. The patient is safe for discharge and has  been instructed to return immediately for worsening symptoms, change in  symptoms or any other concerns    Final Clinical Impressions(s) / ED Diagnoses     ICD-10-CM   1. Injury of head, initial encounter  S09.90XA     2. Laceration of forehead, initial encounter  Z76.73AL       ED Discharge Orders     None         Discharge Instructions Discussed with and Provided to Patient:   Discharge Instructions   None      Montine Circle, PA-C 01/13/22 0525    Montine Circle, PA-C 01/13/22 [place], [place], DO 01/14/22 [phone]

## 2022-01-18 ENCOUNTER — Encounter (HOSPITAL_COMMUNITY): Payer: Self-pay | Admitting: Emergency Medicine

## 2022-01-18 ENCOUNTER — Other Ambulatory Visit: Payer: Self-pay

## 2022-01-18 ENCOUNTER — Emergency Department (HOSPITAL_COMMUNITY)
Admission: EM | Admit: 2022-01-18 | Discharge: 2022-01-18 | Disposition: A | Discharge status: Home / Self Care | Payer: MEDICARE | Attending: Emergency Medicine | Admitting: Emergency Medicine

## 2022-01-18 DIAGNOSIS — Z7982 Long term (current) use of aspirin: Secondary | ICD-10-CM | POA: Insufficient documentation

## 2022-01-18 DIAGNOSIS — Z4802 Encounter for removal of sutures: Secondary | ICD-10-CM | POA: Insufficient documentation

## 2022-01-18 NOTE — ED Provider Notes (Signed)
Brian [name] Provider Note   CSN: [id number] Arrival date & time: 01/18/22  1101     History PMH:  Chief Complaint  Patient presents with   Suture / Staple Removal    Brian [name] is a 76 y.o. male.  Presents for suture removal.  Sutures been in place since last Monday.  He denies any complications such as erythema, abnormal discharge, or swelling of the area.  No fevers or chills   Suture / Staple Removal       Home Medications Prior to Admission medications   Medication Sig Start Date End Date Taking? Authorizing Provider  aspirin 81 MG EC tablet Take 1 tablet by mouth daily.    [provider]  b complex vitamins tablet Take 1 tablet by mouth daily.    [provider]  Calcium Carbonate-Vitamin D (CALCIUM-VITAMIN D3 PO) Take by mouth daily.    [provider]  ciprofloxacin (CIPRO) 500 MG tablet Take 1 tablet (500 mg total) by mouth 2 (two) times daily. 10/02/20   Saguier, Percell Miller, PA-C  cyclobenzaprine (FLEXERIL) 5 MG tablet Take 1 tablet by mouth once a day for muscle spasms 02/26/21   Saguier, Percell Miller, PA-C  diazepam (VALIUM) 5 MG tablet Take 1 tablet by mouth 30 minutes prior to procedure 07/08/21     diclofenac (VOLTAREN) 75 MG EC tablet Take 1 tablet (75 mg total) by mouth 2 (two) times daily. 08/05/21   Saguier, Percell Miller, PA-C  donepezil (ARICEPT) 10 MG tablet Take 1 tablet (10 mg total) by mouth at bedtime. 10/22/21   Saguier, Percell Miller, PA-C  famotidine (PEPCID) 40 MG tablet TAKE 1 TABLET EVERY DAY 07/20/20   Saguier, Percell Miller, PA-C  levocetirizine (XYZAL) 5 MG tablet Take 1 tablet (5 mg total) by mouth every evening. 10/22/21   Saguier, Percell Miller, PA-C  meclizine (ANTIVERT) 12.5 MG tablet Take 1 tablet (12.5 mg total) by mouth 3 (three) times daily as needed for dizziness. 02/02/19   Saguier, Percell Miller, PA-C  pantoprazole (PROTONIX) 40 MG tablet TAKE 1 TABLET EVERY DAY 07/20/20   Saguier, Percell Miller, PA-C  predniSONE (DELTASONE) 10 MG  tablet Take 6 tablets by mouth daily for 1 day then 5 tabs for 1 day then 4 tabs for 1 day then 3 tabs for 1 day then 2 tabs for 1 day then 1 tab for 1 day 10/22/21   Saguier, Percell Miller, PA-C  rosuvastatin (CRESTOR) 20 MG tablet TAKE 1 TABLET (20 MG TOTAL) BY MOUTH DAILY. 09/10/20   Saguier, Percell Miller, PA-C  rosuvastatin (CRESTOR) 20 MG tablet Take 1 tablet (20 mg total) by mouth daily. 07/24/21   Saguier, Percell Miller, PA-C  tamsulosin (FLOMAX) 0.4 MG CAPS capsule Take 2 capsules (0.8 mg total) by mouth daily. 11/28/19   Saguier, Percell Miller, PA-C  timolol (TIMOPTIC) 0.5 % ophthalmic solution Place 1 drop into the left eye daily. Patient not taking: Reported on 01/13/2022 10/03/16   Saguier, Percell Miller, PA-C  traMADol Veatrice Bourbon) 50 MG tablet 1 tab po q 8 hours prn pain 08/13/21   Saguier, Percell Miller, PA-C      Allergies    Gabapentin    Review of Systems   Review of Systems  Skin:  Positive for wound.  All other systems reviewed and are negative.   Physical Exam Updated Vital Signs BP 128/77 (BP Location: Left Arm)   Pulse 63   Temp 98.1 F (36.7 C) (Oral)   Resp 16   SpO2 100%  Physical Exam Vitals and nursing note reviewed.  Constitutional:      General: He is not in acute distress.    Appearance: Normal appearance. He is well-developed. He is not ill-appearing, toxic-appearing or diaphoretic.  HENT:     Head: Normocephalic.     Comments: 3 cm close laceration on right forehead.  No erythema, or abnormal drainage noted.    Nose: No nasal deformity.     Mouth/Throat:     Lips: Pink. No lesions.  Eyes:     General: Gaze aligned appropriately. No scleral icterus.       Right eye: No discharge.        Left eye: No discharge.     Conjunctiva/sclera: Conjunctivae normal.     Right eye: Right conjunctiva is not injected. No exudate or hemorrhage.    Left eye: Left conjunctiva is not injected. No exudate or hemorrhage. Pulmonary:     Effort: Pulmonary effort is normal. No respiratory distress.  Skin:     General: Skin is warm and dry.  Neurological:     Mental Status: He is alert and oriented to person, place, and time.  Psychiatric:        Mood and Affect: Mood normal.        Speech: Speech normal.        Behavior: Behavior normal. Behavior is cooperative.     ED Results / Procedures / Treatments   Labs (all labs ordered are listed, but only abnormal results are displayed) Labs Reviewed - No data to display  EKG None  Radiology No results found.  Procedures Procedures   Medications Ordered in ED Medications - No data to display  ED Course/ Medical Decision Making/ A&P                           Medical Decision Making  Here for suture removal.  No signs of infection at this time.  I removed 6 stitches without complication. Stable for discharge.    Final Clinical Impression(s) / ED Diagnoses Final diagnoses:  Visit for suture removal    Rx / DC Orders ED Discharge Orders     None         Adolphus Birchwood, PA-C 01/18/22 1145    Elgie Congo, MD 01/18/22 1702

## 2022-01-18 NOTE — ED Triage Notes (Signed)
Patient is here for suture removal. Denies other complaints.

## 2022-01-20 ENCOUNTER — Telehealth: Payer: Self-pay | Admitting: Medical

## 2022-01-20 NOTE — Telephone Encounter (Signed)
Pt called stating he had recently moved to [facility] and wanted to see if there were any providers Brian [name] would recommend that were closer to home for him rather than coming all the way to [facility].

## 2022-01-21 NOTE — Telephone Encounter (Signed)
Pt.notified

## 2022-03-11 ENCOUNTER — Telehealth: Payer: Self-pay | Admitting: *Deleted

## 2022-03-11 ENCOUNTER — Encounter: Payer: Self-pay | Admitting: *Deleted

## 2022-03-11 NOTE — Patient Outreach (Signed)
  Care Coordination   Initial Visit Note   03/11/2022 Name: Brian [name] MRN: [id number] DOB: 26-Jun-1945  Brian [name] is a 76 y.o. year old male who sees Saguier, Percell Miller, [place] for primary care. I spoke with  Brian [name] by phone today.  What matters to the patients health and wellness today?  "I am doing okay overall..... I have switched PCP to a new doctor that is not in the [facility] system, I could not find a [facility] PCP that was accepting new patients.... my first appointment is in December.  Things are going about the same as when I saw Dr. Jaquita Rector back in May.... I just needed a doctor that was closer to me now that I have moved.... I will get my flu vaccine soon"  Interventions provided; no further or ongoing care coordination needs identified   Goals Addressed             This Visit's Progress    Care Coordination Activities: No follow up required   On track    Care Coordination Interventions: Evaluation of current treatment plan related to general health maintenance and patient's adherence to plan as established by provider Advised patient to provide appropriate vaccination information to provider or CM team member at next visit Advised patient to take a list of his current medications to new PCP appointment in December 2023 Reviewed scheduled/upcoming provider appointments including none- patient tells me he has a new PCP (Non- [facility] provider); states he tried to get a PCP with [facility] but "none were accepting new patient's;" he tells me he needs a PCP "very close" to where he lives- he does not drive and relies on volunteer services/ friends for transportation Advised patient to discuss need for orthopedic/ neurosurgery referral-- he tells me has ongoing neck pain, as discussed with PCP on 10/22/21- we reviewed this visit with provider Assessed social determinant of health barriers Encouraged patient to obtain flu vaccine for 2023-24 flu/ winter season- he states he is planning to do soon           SDOH assessments and interventions completed:  Yes  SDOH Interventions Today    Flowsheet Row Most Recent Value  SDOH Interventions   Food Insecurity Interventions Intervention Not Indicated  Transportation Interventions Intervention Not Indicated  [patient 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   Encounter Outcome:  Pt. Visit Completed   Oneta Rack, RN, BSN, CCRN Alumnus RN CM Care Coordination/ [facility] [phone]: direct office

## 2022-03-28 ENCOUNTER — Other Ambulatory Visit (HOSPITAL_BASED_OUTPATIENT_CLINIC_OR_DEPARTMENT_OTHER): Payer: Self-pay

## 2022-03-28 MED ORDER — PREDNISONE 5 MG PO TABS
ORAL_TABLET | ORAL | 0 refills | Status: DC
  Filled 2022-03-28: qty 21, 6d supply, fill #0

## 2022-05-23 ENCOUNTER — Ambulatory Visit
Admission: RE | Admit: 2022-05-23 | Discharge: 2022-05-23 | Disposition: A | Discharge status: Home / Self Care | Payer: MEDICARE | Source: Ambulatory Visit | Attending: Family Medicine | Admitting: Family Medicine

## 2022-05-23 ENCOUNTER — Other Ambulatory Visit: Payer: Self-pay | Admitting: Family Medicine

## 2022-05-23 DIAGNOSIS — M549 Dorsalgia, unspecified: Secondary | ICD-10-CM

## 2022-05-23 DIAGNOSIS — R079 Chest pain, unspecified: Secondary | ICD-10-CM

## 2022-06-13 ENCOUNTER — Encounter (HOSPITAL_COMMUNITY): Payer: Self-pay | Admitting: Family Medicine

## 2022-06-30 ENCOUNTER — Other Ambulatory Visit (HOSPITAL_BASED_OUTPATIENT_CLINIC_OR_DEPARTMENT_OTHER): Payer: Self-pay | Admitting: Family Medicine

## 2022-06-30 DIAGNOSIS — R0789 Other chest pain: Secondary | ICD-10-CM

## 2022-07-11 ENCOUNTER — Ambulatory Visit (HOSPITAL_COMMUNITY)
Admission: RE | Admit: 2022-07-11 | Discharge: 2022-07-11 | Disposition: A | Discharge status: Home / Self Care | Payer: MEDICARE | Source: Ambulatory Visit | Attending: Family Medicine | Admitting: Family Medicine

## 2022-07-11 DIAGNOSIS — R0789 Other chest pain: Secondary | ICD-10-CM | POA: Insufficient documentation

## 2022-07-11 MED ORDER — IOHEXOL 350 MG/ML SOLN
75.0000 mL | Freq: Once | INTRAVENOUS | Status: AC | PRN
  Administered 2022-07-11: 75 mL via INTRAVENOUS

## 2022-09-15 ENCOUNTER — Encounter: Payer: Self-pay | Admitting: *Deleted

## 2022-10-24 ENCOUNTER — Other Ambulatory Visit: Payer: Self-pay | Admitting: Family Medicine

## 2022-10-24 DIAGNOSIS — R519 Headache, unspecified: Secondary | ICD-10-CM

## 2022-11-26 ENCOUNTER — Ambulatory Visit
Admission: RE | Admit: 2022-11-26 | Discharge: 2022-11-26 | Disposition: A | Discharge status: Home / Self Care | Payer: MEDICARE | Source: Ambulatory Visit | Attending: Family Medicine | Admitting: Family Medicine

## 2022-11-26 DIAGNOSIS — R519 Headache, unspecified: Secondary | ICD-10-CM

## 2023-04-16 ENCOUNTER — Encounter (HOSPITAL_BASED_OUTPATIENT_CLINIC_OR_DEPARTMENT_OTHER): Payer: Self-pay | Admitting: Orthopedic Surgery

## 2023-04-16 ENCOUNTER — Other Ambulatory Visit (HOSPITAL_COMMUNITY): Payer: Self-pay | Admitting: Family Medicine

## 2023-04-16 DIAGNOSIS — R109 Unspecified abdominal pain: Secondary | ICD-10-CM

## 2023-04-27 ENCOUNTER — Ambulatory Visit (HOSPITAL_COMMUNITY)
Admission: RE | Admit: 2023-04-27 | Discharge: 2023-04-27 | Disposition: A | Discharge status: Home / Self Care | Payer: MEDICARE | Source: Ambulatory Visit | Attending: Family Medicine | Admitting: Family Medicine

## 2023-04-27 DIAGNOSIS — R109 Unspecified abdominal pain: Secondary | ICD-10-CM | POA: Diagnosis present

## 2023-08-26 ENCOUNTER — Other Ambulatory Visit: Payer: Self-pay

## 2023-08-26 ENCOUNTER — Ambulatory Visit: Payer: MEDICARE | Admitting: Family Medicine

## 2023-08-26 VITALS — BP 129/67 | Ht 64.0 in | Wt 178.0 lb

## 2023-08-26 DIAGNOSIS — M25511 Pain in right shoulder: Secondary | ICD-10-CM

## 2023-08-26 MED ORDER — METHYLPREDNISOLONE ACETATE 40 MG/ML IJ SUSP
40.0000 mg | Freq: Once | INTRAMUSCULAR | Status: AC
  Administered 2023-08-26: 40 mg via INTRA_ARTICULAR

## 2023-08-26 NOTE — Patient Instructions (Signed)
 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 Try to avoid painful activities (overhead activities, lifting with extended arm) as much as possible. Tylenol, topical medications if needed.  Take aleve twice a day with food as needed if you can take anti-inflammatories Subacromial injection may be beneficial to help with pain and to decrease inflammation - you were given this today. Consider physical therapy with transition to home exercise program. Do home exercise program with theraband and scapular stabilization exercises daily 3 sets of 10 once a day. If not improving at follow-up we will consider further imaging, injection, physical therapy, and/or nitro patches. Follow up with me in 6 weeks.

## 2023-08-28 ENCOUNTER — Encounter: Payer: Self-pay | Admitting: Family Medicine

## 2023-08-28 NOTE — Progress Notes (Signed)
 PCP: Phebe Colla, MD  Subjective:   HPI: Patient is a 78 y.o. male here for right shoulder pain.  Patient was last seen for right shoulder pain 2 years ago. Current pain started about 6 months ago, worse with lifting anything. Bothers with overhead motions also. No new injury or trauma. Tried gabapentin but caused him to feel weird and the arm felt worse.  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     b complex vitamins tablet Take 1 tablet by mouth daily.     Calcium Carbonate-Vitamin D (CALCIUM-VITAMIN D3 PO) Take by mouth daily.     ciprofloxacin (CIPRO) 500 MG tablet Take 1 tablet (500 mg total) by mouth 2 (two) times daily. 14 tablet 0   cyclobenzaprine (FLEXERIL) 5 MG tablet Take 1 tablet by mouth once a day for muscle spasms 5 tablet 0   diazepam (VALIUM) 5 MG tablet Take 1 tablet by mouth 30 minutes prior to procedure 2 tablet 0   diclofenac (VOLTAREN) 75 MG EC tablet Take 1 tablet (75 mg total) by mouth 2 (two) times daily. 20 tablet 0   donepezil (ARICEPT) 10 MG tablet Take 1 tablet (10 mg total) by mouth at bedtime. 90 tablet 3   famotidine (PEPCID) 40 MG tablet TAKE 1 TABLET EVERY DAY 90 tablet 0   levocetirizine (XYZAL) 5 MG tablet Take 1 tablet (5 mg total) by  mouth every evening. 30 tablet 0   meclizine (ANTIVERT) 12.5 MG tablet Take 1 tablet (12.5 mg total) by mouth 3 (three) times daily as needed for dizziness. 21 tablet 0   pantoprazole (PROTONIX) 40 MG tablet TAKE 1 TABLET EVERY DAY 90 tablet 0   predniSONE (DELTASONE) 10 MG tablet Take 6 tablets by mouth daily for 1 day then 5 tabs for 1 day then 4 tabs for 1 day then 3 tabs for 1 day then 2 tabs for 1 day then 1 tab for 1 day 21 tablet 0   rosuvastatin (CRESTOR) 20 MG tablet TAKE 1 TABLET (20 MG TOTAL) BY MOUTH DAILY. 90 tablet 3   rosuvastatin (CRESTOR) 20 MG tablet Take 1 tablet (20 mg total) by mouth daily. 90 tablet 3   tamsulosin (FLOMAX) 0.4 MG CAPS capsule Take 2 capsules (0.8 mg total) by mouth daily. 90 capsule 1   timolol (TIMOPTIC) 0.5 % ophthalmic solution Place 1 drop into the left eye daily. (Patient not taking: Reported on 01/13/2022) 10 mL 12   traMADol (ULTRAM) 50 MG tablet 1 tab po q 8 hours prn pain 15 tablet 0   Current Facility-Administered Medications on File Prior to Visit  Medication Dose  Route Frequency Provider Last Rate Last Admin   alum & mag hydroxide-simeth (MAALOX/MYLANTA) 200-200-20 MG/5ML suspension 30 mL  30 mL Oral Once Saguier, Edward, PA-C       hyoscyamine (LEVSIN SL) SL tablet 0.125 mg  0.125 mg Sublingual Once Saguier, Edward, PA-C       lidocaine (XYLOCAINE) 2 % viscous mouth solution 15 mL  15 mL Mouth/Throat Once Marisue Brooklyn        Past Surgical History:  Procedure Laterality Date   ABDOMINAL HERNIA REPAIR  1972   APPENDECTOMY  1973   BACK SURGERY  10/28/2017   CATARACT EXTRACTION, BILATERAL  2005   CHOLECYSTECTOMY  08/2016   COLONOSCOPY WITH ESOPHAGOGASTRODUODENOSCOPY (EGD)     about 2016. [place] [facility]   ESOPHAGOGASTRODUODENOSCOPY  06.2011   EYE SURGERY  1972   Bilateral Lens Replacement   FACIAL RECONSTRUCTION SURGERY  2008   LEFT HEART CATH  02.17.2014   MANDIBLE SURGERY  1968, 2008   Dislocated Jaw   PARS PLANA VITRECTOMY  W/ REPAIR OF MACULAR HOLE  2005   macular hole repair, right eye   PARS PLANA VITRECTOMY W/ REPAIR OF MACULAR HOLE  2010   mechanical, left eye   PROSTATE BIOPSY  Jully 2017   w/ Dr. Margo Aye   SPINAL CORD STIMULATOR IMPLANT     WISDOM TOOTH EXTRACTION      Allergies  Allergen Reactions   Gabapentin Other (See Comments)    Feel "weird"; Nausea; Weakness; Syncope    BP 129/67   Ht 5\' 4"  (1.626 m)   Wt 178 lb (80.7 kg)   BMI 30.55 kg/m       No data to display              No data to display              Objective:  Physical Exam:  Gen: NAD, comfortable in exam room  Right shoulder: No swelling, ecchymoses.  No gross deformity. No TTP AC, biceps tendon. Full passive motion.  Abduction/flexion to 90 degrees. Negative Hawkins, Neers. Negative Yergasons. Strength 3/5 with empty can and 5/5 resisted internal/external rotation. NV intact distally.  Complete MSK u/s right shoulder: Biceps tendon: bicipital tenosynovitis Pec major tendon: intact Subscapularis: hypoechoic change bursal side but no visible tear AC joint: moderate arthropathy but no effusion Infraspinatus: intact with some calcific change insertional side of tendon Supraspinatus: full thickness partial width tear of the anterior supraspinatus tendon Posterior glenohumeral joint: trace effusion.  No paralabral cyst.  No visible posterior labral tear.  Impression: full thickness partial width tear of supraspinatus - appears remote.  Subacromial bursitis.  Subscapularis tendinopathy.  Bicipital tenosynovitis.  Assessment & Plan:  1. Right shoulder pain - due to chronic supraspinatus tear with subacromial bursitis.  Subacromial injection given today.  Tylenol, topical medications.  Home exercises reviewed.  Consider formal physical therapy.  Follow up in 6 weeks.  After informed written consent timeout was performed, patient was seated in chair in exam room. Right shoulder was prepped with alcohol swab  and utilizing lateral approach with ultrasound guidance, patient's right subacromial space was injected with 3:1 lidocaine: depomedrol. Patient tolerated the procedure well without immediate complications.

## 2023-10-07 ENCOUNTER — Ambulatory Visit: Payer: MEDICARE | Admitting: Family Medicine

## 2023-10-12 ENCOUNTER — Ambulatory Visit: Payer: MEDICARE | Admitting: Family Medicine

## 2023-11-20 ENCOUNTER — Other Ambulatory Visit: Payer: Self-pay | Admitting: Family Medicine

## 2023-11-20 DIAGNOSIS — R519 Headache, unspecified: Secondary | ICD-10-CM

## 2023-11-26 ENCOUNTER — Ambulatory Visit (HOSPITAL_BASED_OUTPATIENT_CLINIC_OR_DEPARTMENT_OTHER): Admission: RE | Admit: 2023-11-26 | Payer: MEDICARE | Source: Ambulatory Visit

## 2023-11-27 ENCOUNTER — Ambulatory Visit
Admission: RE | Admit: 2023-11-27 | Discharge: 2023-11-27 | Disposition: A | Discharge status: Home / Self Care | Payer: MEDICARE | Source: Ambulatory Visit | Attending: Pulmonary Disease | Admitting: Pulmonary Disease

## 2023-11-27 ENCOUNTER — Other Ambulatory Visit: Payer: Self-pay | Admitting: Pulmonary Disease

## 2023-11-27 DIAGNOSIS — M542 Cervicalgia: Secondary | ICD-10-CM

## 2023-11-30 ENCOUNTER — Ambulatory Visit (HOSPITAL_COMMUNITY)
Admission: RE | Admit: 2023-11-30 | Discharge: 2023-11-30 | Disposition: A | Discharge status: Home / Self Care | Payer: MEDICARE | Source: Ambulatory Visit | Attending: Family Medicine | Admitting: Family Medicine

## 2023-11-30 DIAGNOSIS — R519 Headache, unspecified: Secondary | ICD-10-CM | POA: Diagnosis present

## 2023-12-08 ENCOUNTER — Encounter: Payer: Self-pay | Admitting: Family Medicine

## 2024-03-16 ENCOUNTER — Other Ambulatory Visit: Payer: Self-pay

## 2024-04-07 ENCOUNTER — Other Ambulatory Visit (HOSPITAL_COMMUNITY): Payer: Self-pay | Admitting: Physician Assistant

## 2024-04-07 DIAGNOSIS — M4802 Spinal stenosis, cervical region: Secondary | ICD-10-CM
